# Patient Record
Sex: Female | Born: 1955 | Race: White | Hispanic: No | Smoking: Current every day smoker
Health system: Southern US, Community
[De-identification: ages and names within clinical notes are randomized; demographics above are authoritative.]

## PROBLEM LIST (undated history)

## (undated) DIAGNOSIS — I252 Old myocardial infarction: Secondary | ICD-10-CM

## (undated) DIAGNOSIS — Z860100 Personal history of colon polyps, unspecified: Secondary | ICD-10-CM

## (undated) DIAGNOSIS — I2510 Atherosclerotic heart disease of native coronary artery without angina pectoris: Secondary | ICD-10-CM

## (undated) DIAGNOSIS — R011 Cardiac murmur, unspecified: Secondary | ICD-10-CM

## (undated) DIAGNOSIS — M1990 Unspecified osteoarthritis, unspecified site: Secondary | ICD-10-CM

## (undated) DIAGNOSIS — IMO0002 Reserved for concepts with insufficient information to code with codable children: Secondary | ICD-10-CM

## (undated) DIAGNOSIS — K603 Anal fistula, unspecified: Secondary | ICD-10-CM

## (undated) DIAGNOSIS — K5010 Crohn's disease of large intestine without complications: Secondary | ICD-10-CM

## (undated) HISTORY — DX: Anal fistula: K60.3

## (undated) HISTORY — PX: APPENDECTOMY: SHX54

## (undated) HISTORY — DX: Personal history of colonic polyps: Z86.010

## (undated) HISTORY — DX: Crohn's disease of large intestine without complications: K50.10

## (undated) HISTORY — DX: Old myocardial infarction: I25.2

## (undated) HISTORY — DX: Atherosclerotic heart disease of native coronary artery without angina pectoris: I25.10

## (undated) HISTORY — PX: ANGIOPLASTY: SHX39

## (undated) HISTORY — DX: Cardiac murmur, unspecified: R01.1

## (undated) HISTORY — DX: Personal history of colon polyps, unspecified: Z86.0100

## (undated) HISTORY — PX: CHOLECYSTECTOMY: SHX55

## (undated) HISTORY — DX: Anal fistula, unspecified: K60.30

## (undated) HISTORY — DX: Reserved for concepts with insufficient information to code with codable children: IMO0002

## (undated) HISTORY — DX: Unspecified osteoarthritis, unspecified site: M19.90

---
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
## 1984-03-07 DIAGNOSIS — I252 Old myocardial infarction: Secondary | ICD-10-CM

## 1984-03-07 HISTORY — DX: Old myocardial infarction: I25.2

## 1986-03-07 HISTORY — PX: COLON RESECTION: SHX5231

## 1991-03-08 HISTORY — PX: TUBAL LIGATION: SHX77

## 1999-08-23 ENCOUNTER — Encounter (HOSPITAL_COMMUNITY): Admission: RE | Admit: 1999-08-23 | Discharge: 1999-11-21 | Payer: Self-pay | Admitting: Internal Medicine

## 2000-03-23 ENCOUNTER — Encounter (HOSPITAL_COMMUNITY): Admission: RE | Admit: 2000-03-23 | Discharge: 2000-06-21 | Payer: Self-pay | Admitting: Internal Medicine

## 2002-11-05 ENCOUNTER — Encounter (INDEPENDENT_AMBULATORY_CARE_PROVIDER_SITE_OTHER): Payer: Self-pay | Admitting: Specialist

## 2002-11-05 ENCOUNTER — Ambulatory Visit (HOSPITAL_COMMUNITY): Admission: RE | Admit: 2002-11-05 | Discharge: 2002-11-05 | Payer: Self-pay | Admitting: Internal Medicine

## 2002-11-25 ENCOUNTER — Encounter (HOSPITAL_COMMUNITY): Admission: RE | Admit: 2002-11-25 | Discharge: 2003-02-23 | Payer: Self-pay | Admitting: Internal Medicine

## 2004-05-19 ENCOUNTER — Ambulatory Visit: Payer: Self-pay | Admitting: Internal Medicine

## 2004-05-21 ENCOUNTER — Ambulatory Visit (HOSPITAL_COMMUNITY): Admission: RE | Admit: 2004-05-21 | Discharge: 2004-05-21 | Payer: Self-pay | Admitting: Internal Medicine

## 2004-06-30 ENCOUNTER — Ambulatory Visit: Payer: Self-pay | Admitting: Internal Medicine

## 2004-08-09 ENCOUNTER — Ambulatory Visit: Payer: Self-pay | Admitting: Internal Medicine

## 2004-09-30 ENCOUNTER — Ambulatory Visit: Payer: Self-pay | Admitting: Internal Medicine

## 2004-11-23 ENCOUNTER — Ambulatory Visit: Payer: Self-pay | Admitting: Internal Medicine

## 2005-01-24 ENCOUNTER — Ambulatory Visit: Payer: Self-pay | Admitting: Internal Medicine

## 2005-06-01 ENCOUNTER — Ambulatory Visit: Payer: Self-pay | Admitting: Internal Medicine

## 2005-09-05 ENCOUNTER — Ambulatory Visit: Payer: Self-pay | Admitting: Internal Medicine

## 2005-09-26 ENCOUNTER — Ambulatory Visit: Payer: Self-pay | Admitting: Internal Medicine

## 2005-09-26 ENCOUNTER — Encounter: Payer: Self-pay | Admitting: Internal Medicine

## 2005-09-26 DIAGNOSIS — K5010 Crohn's disease of large intestine without complications: Secondary | ICD-10-CM | POA: Insufficient documentation

## 2005-11-01 ENCOUNTER — Ambulatory Visit: Payer: Self-pay | Admitting: Internal Medicine

## 2005-12-21 ENCOUNTER — Ambulatory Visit: Payer: Self-pay | Admitting: Internal Medicine

## 2006-05-17 ENCOUNTER — Ambulatory Visit: Payer: Self-pay | Admitting: Internal Medicine

## 2007-01-01 ENCOUNTER — Ambulatory Visit: Payer: Self-pay | Admitting: Internal Medicine

## 2007-01-01 LAB — CONVERTED CEMR LAB
Albumin: 3.7 g/dL (ref 3.5–5.2)
Alkaline Phosphatase: 82 units/L (ref 39–117)
BUN: 4 mg/dL — ABNORMAL LOW (ref 6–23)
Basophils Absolute: 0.1 10*3/uL (ref 0.0–0.1)
Creatinine, Ser: 0.7 mg/dL (ref 0.4–1.2)
Eosinophils Absolute: 0.1 10*3/uL (ref 0.0–0.6)
Eosinophils Relative: 0.9 % (ref 0.0–5.0)
Folate: 5.2 ng/mL
GFR calc Af Amer: 113 mL/min
MCV: 87.4 fL (ref 78.0–100.0)
Platelets: 459 10*3/uL — ABNORMAL HIGH (ref 150–400)
Potassium: 4.6 meq/L (ref 3.5–5.1)
RBC: 4.65 M/uL (ref 3.87–5.11)
Saturation Ratios: 17.5 % — ABNORMAL LOW (ref 20.0–50.0)
Sed Rate: 57 mm/hr — ABNORMAL HIGH (ref 0–25)
TSH: 0.4 microintl units/mL (ref 0.35–5.50)
Transferrin: 249.3 mg/dL (ref >=212.0)
WBC: 12.6 10*3/uL — ABNORMAL HIGH (ref 4.5–10.5)

## 2007-05-03 DIAGNOSIS — I2510 Atherosclerotic heart disease of native coronary artery without angina pectoris: Secondary | ICD-10-CM | POA: Insufficient documentation

## 2007-05-03 DIAGNOSIS — K603 Anal fistula, unspecified: Secondary | ICD-10-CM | POA: Insufficient documentation

## 2007-05-11 ENCOUNTER — Ambulatory Visit: Payer: Self-pay | Admitting: Internal Medicine

## 2007-06-11 ENCOUNTER — Ambulatory Visit: Payer: Self-pay | Admitting: Internal Medicine

## 2007-06-14 ENCOUNTER — Ambulatory Visit: Payer: Self-pay | Admitting: Internal Medicine

## 2007-06-14 ENCOUNTER — Encounter: Payer: Self-pay | Admitting: Internal Medicine

## 2007-07-23 ENCOUNTER — Telehealth: Payer: Self-pay | Admitting: Internal Medicine

## 2007-08-23 ENCOUNTER — Telehealth: Payer: Self-pay | Admitting: Internal Medicine

## 2007-09-24 ENCOUNTER — Telehealth: Payer: Self-pay | Admitting: Internal Medicine

## 2007-10-23 ENCOUNTER — Telehealth: Payer: Self-pay | Admitting: Internal Medicine

## 2007-11-26 ENCOUNTER — Telehealth: Payer: Self-pay | Admitting: Internal Medicine

## 2007-12-25 ENCOUNTER — Telehealth: Payer: Self-pay | Admitting: Internal Medicine

## 2008-01-25 ENCOUNTER — Telehealth: Payer: Self-pay | Admitting: Internal Medicine

## 2008-02-21 ENCOUNTER — Telehealth: Payer: Self-pay | Admitting: Internal Medicine

## 2008-03-25 ENCOUNTER — Telehealth: Payer: Self-pay | Admitting: Internal Medicine

## 2008-04-28 ENCOUNTER — Telehealth: Payer: Self-pay | Admitting: Internal Medicine

## 2008-05-19 ENCOUNTER — Ambulatory Visit: Payer: Self-pay | Admitting: Internal Medicine

## 2008-05-21 ENCOUNTER — Encounter: Payer: Self-pay | Admitting: Internal Medicine

## 2008-05-22 ENCOUNTER — Encounter: Payer: Self-pay | Admitting: Internal Medicine

## 2008-06-09 ENCOUNTER — Encounter: Payer: Self-pay | Admitting: Internal Medicine

## 2008-06-26 ENCOUNTER — Telehealth: Payer: Self-pay | Admitting: Internal Medicine

## 2008-07-02 ENCOUNTER — Telehealth: Payer: Self-pay | Admitting: Internal Medicine

## 2008-07-02 ENCOUNTER — Encounter: Payer: Self-pay | Admitting: Internal Medicine

## 2008-07-07 ENCOUNTER — Ambulatory Visit: Payer: Self-pay | Admitting: Internal Medicine

## 2008-07-08 LAB — CONVERTED CEMR LAB
ALT: 11 units/L (ref 0–35)
AST: 16 units/L (ref 0–37)
CO2: 25 meq/L (ref 19–32)
Chloride: 102 meq/L (ref 96–112)
Eosinophils Relative: 0.1 % (ref 0.0–5.0)
GFR calc non Af Amer: 110.92 mL/min (ref >60)
Monocytes Relative: 10.4 % (ref 3.0–12.0)
Neutrophils Relative %: 55.2 % (ref 43.0–77.0)
Platelets: 418 10*3/uL — ABNORMAL HIGH (ref 150.0–400.0)
Sed Rate: 76 mm/hr — ABNORMAL HIGH (ref 0–22)
Sodium: 132 meq/L — ABNORMAL LOW (ref 135–145)
Total Bilirubin: 0.5 mg/dL (ref 0.3–1.2)
Total Protein: 7.2 g/dL (ref 6.0–8.3)
WBC: 18.8 10*3/uL (ref 4.5–10.5)

## 2008-07-09 ENCOUNTER — Ambulatory Visit: Payer: Self-pay | Admitting: Internal Medicine

## 2008-07-09 LAB — CONVERTED CEMR LAB
Basophils Absolute: 0 10*3/uL (ref 0.0–0.1)
Eosinophils Absolute: 0.1 10*3/uL (ref 0.0–0.7)
HCT: 27.9 % — ABNORMAL LOW (ref 36.0–46.0)
Lymphs Abs: 4 10*3/uL (ref 0.7–4.0)
MCHC: 34.9 g/dL (ref 30.0–36.0)
Monocytes Absolute: 2.1 10*3/uL — ABNORMAL HIGH (ref 0.1–1.0)
Monocytes Relative: 14.7 % — ABNORMAL HIGH (ref 3.0–12.0)
Platelets: 491 10*3/uL — ABNORMAL HIGH (ref 150.0–400.0)
RDW: 17.1 % — ABNORMAL HIGH (ref 11.5–14.6)

## 2008-07-10 ENCOUNTER — Telehealth: Payer: Self-pay | Admitting: Internal Medicine

## 2008-07-14 ENCOUNTER — Ambulatory Visit: Payer: Self-pay | Admitting: Cardiovascular Disease

## 2008-07-15 ENCOUNTER — Telehealth: Payer: Self-pay | Admitting: Internal Medicine

## 2008-07-16 ENCOUNTER — Encounter: Payer: Self-pay | Admitting: Internal Medicine

## 2008-07-17 ENCOUNTER — Telehealth: Payer: Self-pay | Admitting: Internal Medicine

## 2008-07-17 ENCOUNTER — Ambulatory Visit: Payer: Self-pay | Admitting: Internal Medicine

## 2008-07-17 DIAGNOSIS — N390 Urinary tract infection, site not specified: Secondary | ICD-10-CM

## 2008-07-17 LAB — CONVERTED CEMR LAB
ALT: 10 units/L (ref 0–35)
Albumin: 2.4 g/dL — ABNORMAL LOW (ref 3.5–5.2)
Basophils Absolute: 0.1 10*3/uL (ref 0.0–0.1)
CO2: 30 meq/L (ref 19–32)
Chloride: 100 meq/L (ref 96–112)
Eosinophils Relative: 0.6 % (ref 0.0–5.0)
GFR calc non Af Amer: 110.9 mL/min (ref >60)
Glucose, Bld: 90 mg/dL (ref 70–99)
HCT: 30.3 % — ABNORMAL LOW (ref 36.0–46.0)
Lymphocytes Relative: 30.9 % (ref 12.0–46.0)
Monocytes Relative: 7.8 % (ref 3.0–12.0)
Neutrophils Relative %: 60.1 % (ref 43.0–77.0)
Platelets: 514 10*3/uL — ABNORMAL HIGH (ref 150.0–400.0)
Potassium: 4.8 meq/L (ref 3.5–5.1)
RDW: 17.4 % — ABNORMAL HIGH (ref 11.5–14.6)
Sodium: 134 meq/L — ABNORMAL LOW (ref 135–145)
Total Protein: 7.4 g/dL (ref 6.0–8.3)
WBC: 15.2 10*3/uL — ABNORMAL HIGH (ref 4.5–10.5)

## 2008-07-28 ENCOUNTER — Telehealth: Payer: Self-pay | Admitting: Internal Medicine

## 2008-07-31 ENCOUNTER — Encounter: Payer: Self-pay | Admitting: Internal Medicine

## 2008-07-31 ENCOUNTER — Ambulatory Visit: Payer: Self-pay | Admitting: Internal Medicine

## 2008-07-31 DIAGNOSIS — K5190 Ulcerative colitis, unspecified, without complications: Secondary | ICD-10-CM | POA: Insufficient documentation

## 2008-08-01 ENCOUNTER — Telehealth: Payer: Self-pay | Admitting: Internal Medicine

## 2008-08-01 LAB — CONVERTED CEMR LAB
Basophils Absolute: 0.1 10*3/uL (ref 0.0–0.1)
Eosinophils Relative: 3.3 % (ref 0.0–5.0)
HCT: 31.6 % — ABNORMAL LOW (ref 36.0–46.0)
Lymphs Abs: 4.6 10*3/uL — ABNORMAL HIGH (ref 0.7–4.0)
Monocytes Absolute: 1.1 10*3/uL — ABNORMAL HIGH (ref 0.1–1.0)
Monocytes Relative: 11 % (ref 3.0–12.0)
Neutrophils Relative %: 39 % — ABNORMAL LOW (ref 43.0–77.0)
Platelets: 451 10*3/uL — ABNORMAL HIGH (ref 150.0–400.0)
RDW: 15 % — ABNORMAL HIGH (ref 11.5–14.6)
WBC: 10 10*3/uL (ref 4.5–10.5)

## 2008-08-21 ENCOUNTER — Telehealth: Payer: Self-pay | Admitting: Internal Medicine

## 2008-09-04 ENCOUNTER — Encounter: Payer: Self-pay | Admitting: Internal Medicine

## 2008-09-18 ENCOUNTER — Telehealth: Payer: Self-pay | Admitting: Internal Medicine

## 2008-10-06 ENCOUNTER — Telehealth: Payer: Self-pay | Admitting: Internal Medicine

## 2008-10-14 ENCOUNTER — Inpatient Hospital Stay (HOSPITAL_COMMUNITY): Admission: RE | Admit: 2008-10-14 | Discharge: 2008-10-16 | Payer: Self-pay | Admitting: Surgery

## 2008-11-06 ENCOUNTER — Encounter: Payer: Self-pay | Admitting: Internal Medicine

## 2008-11-17 ENCOUNTER — Telehealth: Payer: Self-pay | Admitting: Internal Medicine

## 2008-12-15 ENCOUNTER — Ambulatory Visit: Payer: Self-pay | Admitting: Internal Medicine

## 2009-01-19 ENCOUNTER — Telehealth: Payer: Self-pay | Admitting: Internal Medicine

## 2009-02-09 ENCOUNTER — Telehealth: Payer: Self-pay | Admitting: Internal Medicine

## 2009-02-25 ENCOUNTER — Encounter (INDEPENDENT_AMBULATORY_CARE_PROVIDER_SITE_OTHER): Payer: Self-pay | Admitting: *Deleted

## 2009-03-26 ENCOUNTER — Telehealth: Payer: Self-pay | Admitting: Internal Medicine

## 2009-04-24 ENCOUNTER — Telehealth: Payer: Self-pay | Admitting: Internal Medicine

## 2009-05-08 ENCOUNTER — Ambulatory Visit: Payer: Self-pay | Admitting: Internal Medicine

## 2009-05-11 LAB — CONVERTED CEMR LAB
Albumin: 4.1 g/dL (ref 3.5–5.2)
BUN: 10 mg/dL (ref 6–23)
Basophils Relative: 0.4 % (ref 0.0–3.0)
CO2: 32 meq/L (ref 19–32)
Calcium: 9.4 mg/dL (ref 8.4–10.5)
Chloride: 98 meq/L (ref 96–112)
Eosinophils Relative: 1.7 % (ref 0.0–5.0)
GFR calc non Af Amer: 79.33 mL/min (ref >60)
Glucose, Bld: 85 mg/dL (ref 70–99)
HCT: 40.9 % (ref 36.0–46.0)
MCV: 92.1 fL (ref 78.0–100.0)
Monocytes Absolute: 0.6 10*3/uL (ref 0.1–1.0)
Monocytes Relative: 6.5 % (ref 3.0–12.0)
Neutrophils Relative %: 33.9 % — ABNORMAL LOW (ref 43.0–77.0)
Platelets: 357 10*3/uL (ref 150.0–400.0)
Potassium: 4.8 meq/L (ref 3.5–5.1)
RBC: 4.44 M/uL (ref 3.87–5.11)
Sed Rate: 28 mm/hr — ABNORMAL HIGH (ref 0–22)
Sodium: 136 meq/L (ref 135–145)
Total Protein: 9.1 g/dL — ABNORMAL HIGH (ref 6.0–8.3)
WBC: 9.2 10*3/uL (ref 4.5–10.5)

## 2009-05-12 ENCOUNTER — Ambulatory Visit: Payer: Self-pay | Admitting: Internal Medicine

## 2009-05-18 ENCOUNTER — Ambulatory Visit: Payer: Self-pay | Admitting: Internal Medicine

## 2009-05-19 ENCOUNTER — Encounter: Payer: Self-pay | Admitting: Internal Medicine

## 2009-10-27 ENCOUNTER — Telehealth: Payer: Self-pay | Admitting: Internal Medicine

## 2009-12-01 ENCOUNTER — Ambulatory Visit: Payer: Self-pay | Admitting: Internal Medicine

## 2009-12-01 DIAGNOSIS — R109 Unspecified abdominal pain: Secondary | ICD-10-CM | POA: Insufficient documentation

## 2009-12-01 DIAGNOSIS — K6289 Other specified diseases of anus and rectum: Secondary | ICD-10-CM

## 2009-12-01 LAB — CONVERTED CEMR LAB
ALT: 13 units/L (ref 0–35)
Alkaline Phosphatase: 123 units/L — ABNORMAL HIGH (ref 39–117)
Basophils Absolute: 0.1 10*3/uL (ref 0.0–0.1)
CO2: 30 meq/L (ref 19–32)
Creatinine, Ser: 0.9 mg/dL (ref 0.4–1.2)
Eosinophils Relative: 2.8 % (ref 0.0–5.0)
GFR calc non Af Amer: 65.72 mL/min (ref >60)
HCT: 38.2 % (ref 36.0–46.0)
Iron: 50 ug/dL (ref 42–145)
Lymphocytes Relative: 38.4 % (ref 12.0–46.0)
Lymphs Abs: 3.7 10*3/uL (ref 0.7–4.0)
Monocytes Relative: 6.7 % (ref 3.0–12.0)
Neutrophils Relative %: 51.3 % (ref 43.0–77.0)
Platelets: 530 10*3/uL — ABNORMAL HIGH (ref 150.0–400.0)
RDW: 13.8 % (ref 11.5–14.6)
Saturation Ratios: 14.5 % — ABNORMAL LOW (ref 20.0–50.0)
Sed Rate: 52 mm/hr — ABNORMAL HIGH (ref 0–22)
Total Bilirubin: 0.2 mg/dL — ABNORMAL LOW (ref 0.3–1.2)
Vitamin B-12: 277 pg/mL (ref 211–911)
WBC: 9.6 10*3/uL (ref 4.5–10.5)

## 2009-12-03 ENCOUNTER — Ambulatory Visit: Payer: Self-pay | Admitting: Cardiology

## 2009-12-04 ENCOUNTER — Encounter: Payer: Self-pay | Admitting: Internal Medicine

## 2009-12-21 ENCOUNTER — Ambulatory Visit: Payer: Self-pay | Admitting: Internal Medicine

## 2009-12-22 ENCOUNTER — Telehealth: Payer: Self-pay | Admitting: Internal Medicine

## 2010-01-15 ENCOUNTER — Telehealth: Payer: Self-pay | Admitting: Internal Medicine

## 2010-02-04 ENCOUNTER — Ambulatory Visit: Payer: Self-pay | Admitting: Cardiology

## 2010-02-04 ENCOUNTER — Ambulatory Visit: Payer: Self-pay | Admitting: Internal Medicine

## 2010-03-06 ENCOUNTER — Encounter: Payer: Self-pay | Admitting: Internal Medicine

## 2010-03-07 HISTORY — PX: COLOSTOMY: SHX63

## 2010-03-24 ENCOUNTER — Other Ambulatory Visit: Payer: Self-pay | Admitting: Internal Medicine

## 2010-03-24 ENCOUNTER — Ambulatory Visit
Admission: RE | Admit: 2010-03-24 | Discharge: 2010-03-24 | Payer: Self-pay | Source: Home / Self Care | Attending: Internal Medicine | Admitting: Internal Medicine

## 2010-03-24 LAB — CBC WITH DIFFERENTIAL/PLATELET
Basophils Absolute: 0.1 10*3/uL (ref 0.0–0.1)
Basophils Relative: 0.8 % (ref 0.0–3.0)
Eosinophils Absolute: 0.3 10*3/uL (ref 0.0–0.7)
Eosinophils Relative: 3.3 % (ref 0.0–5.0)
HCT: 37 % (ref 36.0–46.0)
Hemoglobin: 12.7 g/dL (ref 12.0–15.0)
Lymphocytes Relative: 34.1 % (ref 12.0–46.0)
Lymphs Abs: 3.2 10*3/uL (ref 0.7–4.0)
MCHC: 34.2 g/dL (ref 30.0–36.0)
MCV: 90.8 fl (ref 78.0–100.0)
Monocytes Absolute: 0.6 10*3/uL (ref 0.1–1.0)
Monocytes Relative: 6.3 % (ref 3.0–12.0)
Neutro Abs: 5.1 10*3/uL (ref 1.4–7.7)
Neutrophils Relative %: 55.5 % (ref 43.0–77.0)
Platelets: 407 10*3/uL — ABNORMAL HIGH (ref 150.0–400.0)
RBC: 4.08 Mil/uL (ref 3.87–5.11)
RDW: 14.6 % (ref 11.5–14.6)
WBC: 9.3 10*3/uL (ref 4.5–10.5)

## 2010-04-06 NOTE — Progress Notes (Signed)
Summary: med refill  Phone Note Call from Patient Call back at Home Phone [phone]   Caller: Patient Call For: Dr. Juanda Chance Reason for Call: Talk to Nurse Summary of Call: [facility] [address] will run out of Hydrocodone before sch'ed appt Initial call taken by: Vallarie Mare,  October 27, 2009 11:12 AM  Follow-up for Phone Call        Prescription sent until her appointment on 12/01/09. Dottie Nelson-Smith CMA Duncan Dull)  October 27, 2009 12:46 PM     New/Updated Medications: VICODIN 5-500 MG TABS (HYDROCODONE-ACETAMINOPHEN) Take 1 tablet by mouth once a day as needed. MUST LAST UNTIL OFFICE VISIT ! Prescriptions: VICODIN 5-500 MG TABS (HYDROCODONE-ACETAMINOPHEN) Take 1 tablet by mouth once a day as needed. MUST LAST UNTIL OFFICE VISIT !  #25 x 0   Entered by:   Lamona Curl CMA (AAMA)   Authorized by:   Hart Carwin MD   Signed by:   Lamona Curl CMA (AAMA) on 10/27/2009   Method used:   Printed then faxed to ...       [facility] [address] # [phone]* (retail)       [address] [place], [place]  [address]       Ph: [phone]       Fax: [phone]   RxID:   [id number]
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
## 2010-04-06 NOTE — Progress Notes (Signed)
Summary: Medication  Phone Note Call from Patient Call back at Home Phone [phone]   Caller: Patient Call For: Dr. Juanda Chance Reason for Call: Refill Medication Summary of Call: Needs refill on her HYDROCODONE....sch'd appt for 02-04-10 Initial call taken by: Karna Christmas,  January 15, 2010 10:14 AM  Follow-up for Phone Call        left message that she can have her vicodin on 11/20... pharm aware Follow-up by: Harlow Mares CMA Duncan Dull),  January 15, 2010 4:34 PM

## 2010-04-06 NOTE — Progress Notes (Signed)
Summary: med ?'s  Phone Note Call from Patient Call back at [facility] Phone [phone]   Caller: Patient Call For: Dr. Juanda Chance Reason for Call: Talk to Nurse Summary of Call: does she need to change her Asacol dosage? Initial call taken by: Vallarie Mare,  December 22, 2009 3:16 PM  Follow-up for Phone Call        Message left for patient to call back.  Follow-up by: Jesse Fall RN,  December 22, 2009 3:31 PM  Additional Follow-up for Phone Call Additional follow up Details #1::        Spoke with patient. Asacol dose is to remain the same as per Dr. Regino Schultze dictation from 12/21/09.

## 2010-04-06 NOTE — Assessment & Plan Note (Signed)
Summary: ANNUAL TB TEST               [facility]  Nurse Visit   Allergies: 1)  ! Remicade (Infliximab)  Immunizations Administered:  PPD Skin Test:    Vaccine Type: PPD    Site: right forearm    Mfr: Sanofi Pasteur    Dose: 0.1 ml    Route: ID    Given by: Chales Abrahams CMA (AAMA)    Exp. Date: 08/02/2011    Lot #: [id number]  PPD Results    Date of reading: 05/20/2009    Results: < 5mm    Interpretation: negative  Orders Added: 1)  TB Skin Test [86580] 2)  Admin 1st Vaccine [36644]

## 2010-04-06 NOTE — Progress Notes (Signed)
Summary: Medication refill  Phone Note Call from Patient Call back at Home Phone [phone]   Caller: Patient Call For: Dr. Juanda Chance Reason for Call: Refill Medication Summary of Call: Pt needs her Oxycodone refilled Initial call taken by: Karna Christmas,  April 24, 2009 11:22 AM  Follow-up for Phone Call        Rx created and awaiting Dr Regino Schultze signature.  We have given her only #25 this month as we are trying to wean her off these meds. Follow-up by: Hortense Ramal CMA Duncan Dull),  April 27, 2009 9:54 AM    Prescriptions: OXYCODONE-ACETAMINOPHEN 5-500 MG CAPS (OXYCODONE-ACETAMINOPHEN) Take 1-2 tablets by mouth once daily as needed  #25 x 0   Entered by:   Hortense Ramal CMA (AAMA)   Authorized by:   Hart Carwin MD   Signed by:   Hortense Ramal CMA (AAMA) on 04/27/2009   Method used:   Print then Give to Patient   RxID:   [id number]
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
## 2010-04-06 NOTE — Progress Notes (Signed)
Summary: pain meds  Phone Note Call from Patient Call back at Home Phone [phone]   Caller: Patient Call For: Dr. Juanda Chance Reason for Call: Refill Medication Summary of Call: needs refill of pain meds Initial call taken by: Vallarie Mare,  March 26, 2009 1:56 PM  Follow-up for Phone Call        Rx created. Dr Juanda Chance to sign upon her return on Monday 03/30/09. Follow-up by: Hortense Ramal CMA Duncan Dull),  March 27, 2009 9:09 AM    Prescriptions: OXYCODONE-ACETAMINOPHEN 5-500 MG CAPS (OXYCODONE-ACETAMINOPHEN) Take 1-2 tablets by mouth once daily as needed  #30 x 0   Entered by:   Hortense Ramal CMA (AAMA)   Authorized by:   Hart Carwin MD   Signed by:   Hortense Ramal CMA (AAMA) on 03/27/2009   Method used:   Print then Give to Patient   RxID:   [id number]

## 2010-04-06 NOTE — Letter (Signed)
Summary: Dr. Valarie Merino  Dr. Valarie Merino   Imported By: Esmeralda Links D'jimraou 12/26/2009 11:57:07  _____________________________________________________________________  External Attachment:    Type:   Image     Comment:   External Document
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
## 2010-04-06 NOTE — Letter (Signed)
Summary: [facility] Instructions  [facility]  [address] [place], [place] [address]   Phone: [phone]  Fax: [phone]       [name]    11/23/1959    MRN: [id number]       Procedure Day /Date: 05/12/09 (Tuesday)     Arrival Time: 2:00 pm     Procedure Time: 3:00 pm     Location of Procedure:                    _x_  [facility] (4th Floor)    PREPARATION FOR COLONOSCOPY WITH MIRALAX  Starting 5 days prior to your procedure (beginning today) do not eat nuts, seeds, popcorn, corn, beans, peas,  salads, or any raw vegetables.  Do not take any fiber supplements (e.g. Metamucil, Citrucel, and Benefiber). ____________________________________________________________________________________________________   THE DAY BEFORE YOUR PROCEDURE         DATE: 05/11/09 DAY: Monday  1   Drink clear liquids the entire day-NO SOLID FOOD  2   Do not drink anything colored red or purple.  Avoid juices with pulp.  No orange juice.  3   Drink at least 64 oz. (8 glasses) of fluid/clear liquids during the day to prevent dehydration and help the prep work efficiently.  CLEAR LIQUIDS INCLUDE: Water Jello Ice Popsicles Tea (sugar ok, no milk/cream) Powdered fruit flavored drinks Coffee (sugar ok, no milk/cream) Gatorade Juice: apple, white grape, white cranberry  Lemonade Clear bullion, consomm, broth Carbonated beverages (any kind) Strained chicken noodle soup Hard Candy  4   Mix the entire bottle of Miralax with 64 oz. of Gatorade/Powerade in the morning and put in the refrigerator to chill.  5   At 3:00 pm take 2 Dulcolax/Bisacodyl tablets.  6   At 4:30 pm take one Reglan/Metoclopramide tablet.  7  Starting at 5:00 pm drink one 8 oz glass of the Miralax mixture every 15-20 minutes until you have finished drinking the entire 64 oz.  You should finish drinking prep around 7:30 or 8:00 pm.  8   If you are nauseated, you may take the 2nd  Reglan/Metoclopramide tablet at 6:30 pm.        9    At 8:00 pm take 2 more DULCOLAX/Bisacodyl tablets.       THE DAY OF YOUR PROCEDURE      DATE:  05/12/09 DAY: Tuesday  You may drink clear liquids until 1:00 pm  (2 HOURS BEFORE PROCEDURE).   MEDICATION INSTRUCTIONS  Unless otherwise instructed, you should take regular prescription medications with a small sip of water as early as possible the morning of your procedure.        OTHER INSTRUCTIONS  You will need a responsible adult at least 56 years of age to accompany you and drive you home.   This person must remain in the waiting room during your procedure.  Wear loose fitting clothing that is easily removed.  Leave jewelry and other valuables at home.  However, you may wish to bring a book to read or an iPod/MP3 player to listen to music as you wait for your procedure to start.  Remove all body piercing jewelry and leave at home.  Total time from sign-in until discharge is approximately 2-3 hours.  You should go home directly after your procedure and rest.  You can resume normal activities the day after your procedure.  The day of your procedure you should not:   Drive  Make legal decisions   Operate machinery   Drink alcohol   Return to work  You will receive specific instructions about eating, activities and medications before you leave.   The above instructions have been reviewed and explained to me by   Hortense Ramal CMA Duncan Dull)  May 08, 2009 12:10 PM     I fully understand and can verbalize these instructions _____________________________ Date 05/08/09

## 2010-04-06 NOTE — Letter (Signed)
Summary: Patient Notice- Polyp Results   [facility]  [address] [place], [place] [address]   Phone: [phone]  Fax: [phone]        May 15, 2009 MRN: [id number]    [name] [address] [name], [place]  [address]    Dear [name],  I am pleased to inform you that the biopsies taken during your recent colonoscopy did not show any evidence of cancer upon pathologic examination.The biopsies of Your colon through the colostomy showed very mild colitis. The rest of Your colon  was normal  Additional information/recommendations:  __No further action is needed at this time.  Please follow-up with      your primary care physician for your other healthcare needs.  __Please call [phone] to schedule a return visit to review      your condition.  _x_Continue with the treatment plan as outlined on the day of your      exam.Please continue with the Humira and everything else.  _x_You should have a repeat colonoscopy examination for this problem           in 5_ years.  Please call us if you are having persistent problems or have questions about your condition that have not been fully answered at this time.  Sincerely,  Hart Carwin MD   This letter has been electronically signed by your physician.  Appended Document: Patient Notice- Polyp Results  Letter mailed 3.15.11.

## 2010-04-06 NOTE — Procedures (Signed)
Summary: Colonoscopy  Patient: [name] Note: All result statuses are Final unless otherwise noted.  Tests: (1) Colonoscopy (COL)   COL Colonoscopy           DONE     [facility]     [address]     [place], [place]  [address]           COLONOSCOPY PROCEDURE REPORT           PATIENT:  [name], [name]  MR#:  [id number]     BIRTHDATE:  [birth year], 53 yrs. old  GENDER:  female           ENDOSCOPIST:  Hedwig Morton. Juanda Chance, MD     Referred by:           PROCEDURE DATE:  05/12/2009     PROCEDURE:  Colonoscopy via colostomy     ASA CLASS:  Class II     INDICATIONS:  unexplained diarrhea Crohn's since 1986, s/p colon     resection in 1996 and diverting colostomy in 10/2008,     Humira 40 mg IM q 2 weeks           MEDICATIONS:   Versed 6 mg, Fentanyl 50 mcg           DESCRIPTION OF PROCEDURE:   After the risks benefits and     alternatives of the procedure were thoroughly explained, informed     consent was obtained.  No rectal exam performed. The LB [id number]-[id number]     [id number] endoscope was introduced through the anus and advanced to     the cecum, which was identified by both the appendix and ileocecal     valve, without limitations.  The quality of the prep was adequate,     using MiraLax.  The instrument was then slowly withdrawn as the     colon was fully examined.     <<PROCEDUREIMAGES>>           FINDINGS:  A normal appearing cecum, ileocecal valve, and     appendiceal orifice were identified. The ascending, hepatic     flexure, transverse, splenic flexure, descending, sigmoid colon,     and rectum appeared unremarkable. no evidence of Crohn'd disease,     normal appearing stoma and colon mucosa 0-60 cm Random biopsies     were obtained and sent to pathology (see image1, image2, image3,     image4, image5, and image6). #1 cecum and right colon, normal     appearing mucosa     #2, 0-40 cm left colon   Retroflexed views in the rectum revealed     no abnormalities.    The  scope was then withdrawn from the patient     and the procedure completed.           COMPLICATIONS:  None           ENDOSCOPIC IMPRESSION:     1) Normal colon     via colostomy to cecum, total 60 cm of colon remaining, s/p     random biopsies     RECOMMENDATIONS:     1) Await pathology results     continue meds           REPEAT EXAM:  In 5 year(s) for.           ______________________________     Hedwig Morton. Juanda Chance, MD           CC:  n.     eSIGNED:   Hedwig Morton. Maevyn Riordan at 05/12/2009 04:12 PM           Tyrone Schimke, [id number]  Note: An exclamation mark (!) indicates a result that was not dispersed into the flowsheet. Document Creation Date: 05/12/2009 4:12 PM _______________________________________________________________________  (1) Order result status: Final Collection or observation date-time: 05/12/2009 16:02 Requested date-time:  Receipt date-time:  Reported date-time:  Referring Physician:   Ordering Physician: [name] [phone]) Specimen Source:  Source: Launa Grill Order Number: [id number] Lab site:   Appended Document: Colonoscopy     Procedures Next Due Date:    Colonoscopy: 05/2014

## 2010-04-08 NOTE — Medication Information (Signed)
Summary: RECEIPT program/medco  RECEIPT program/medco   Imported By: Lester [place] 03/11/2010 09:18:19  _____________________________________________________________________  External Attachment:    Type:   Image     Comment:   External Document

## 2010-04-08 NOTE — Assessment & Plan Note (Signed)
Summary: 6 wk f/u--ch.   History of Present Illness Visit Type: Follow-up Visit Primary GI MD: [name] MD Primary Provider: na Requesting Provider: n/a Chief Complaint: F/u for crohn's. Pt states that she feels better and denies any GI complaints  History of Present Illness:   This is a 55 year old white female with Crohn's disease of the rectum, descending colon and ileum. She is status post recent diverting colostomy in August 2010. She is status post segmental resection of the sigmoid stricture in 1996. Her initial diagnosis of Crohn's disease was made in 1986. Her last appointment was 02/05/11. She has done very well and has only minimal complaints today of  pain radiating down her right leg posteriorly. She was able to taper her prednisone  last dose was 03/08/10. She still continues Humira, Asacol, and Imuran. There has been no fever or drainage around the rectum. Her CT scan of the abdomen 6 weeks ago showed improvement in the wall thickening within the descending colon and around the colostomy compared with 12/03/09.   GI Review of Systems      Denies abdominal pain, acid reflux, belching, bloating, chest pain, dysphagia with liquids, dysphagia with solids, heartburn, loss of appetite, nausea, vomiting, vomiting blood, weight loss, and  weight gain.        Denies anal fissure, black tarry stools, change in bowel habit, constipation, diarrhea, diverticulosis, fecal incontinence, heme positive stool, hemorrhoids, irritable bowel syndrome, jaundice, light color stool, liver problems, rectal bleeding, and  rectal pain.    Current Medications (verified): 1)  Diazepam 5 Mg  Tabs (Diazepam) .... Take One By Mouth As Needed 2)  Multivitamins   Tabs (Multiple Vitamin) .... Take One  Tablet By Mouth Once Daily 3)  Aspirin 81 Mg  Tabs (Aspirin) .... Take Two Tablets By Mouth Once Daily 4)  Vicodin 5-500 Mg Tabs (Hydrocodone-Acetaminophen) .... Take 1 Tablet By Mouth Two Times A Day 5)  Humira  Pen 40 Mg/0.44ml Kit (Adalimumab) .... Inject 1 Pen (40mg ) [place] Every Other Week. 6)  Coq-10 50 Mg Caps (Coenzyme Q10) .... Take One By Mouth Once Daily 7)  Bentyl 20 Mg Tabs (Dicyclomine Hcl) .... Take 1 Tablet By Mouth Two Times A Day As Needed For Crampy Abdominal Pain 8)  Asacol 400 Mg Tbec (Mesalamine) .... 2  By Mouth Three Times A Day 9)  Azathioprine Sodium 100 Mg Solr (Azathioprine Sodium) .... Take 1 Tablet By Mouth Once A Day  Allergies (verified): 1)  ! 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 cholecystectomy colon resection angioplasty Appendectomy colestomy  Family History: Reviewed history from 12/15/2008 and no changes required. Family History of Heart Disease: Brother, mother Family History of Lung Cancer: Paternal Uncle No FH of Colon Cancer:  Social History: Disabled  Patient is a former smoker.  Alcohol Use - no Daily Caffeine Use Illicit Drug Use - no Patient does not get regular exercise.   Review of Systems  The patient denies allergy/sinus, anemia, anxiety-new, arthritis/joint pain, back pain, blood in urine, breast changes/lumps, change in vision, confusion, cough, coughing up blood, depression-new, fainting, fatigue, fever, headaches-new, hearing problems, heart murmur, heart rhythm changes, itching, menstrual pain, muscle pains/cramps, night sweats, nosebleeds, pregnancy symptoms, shortness of breath, skin rash, sleeping  problems, sore throat, swelling of feet/legs, swollen lymph glands, thirst - excessive , urination - excessive , urination changes/pain, urine leakage, vision  changes, and voice change.         Pertinent positive and negative review of systems were noted in the above HPI. All other ROS was otherwise negative.   Vital Signs:  Patient profile:   55 year old female Height:      59.5 inches Weight:      116 pounds BMI:     23.12 BSA:     1.47 Pulse rate:   76 / minute Pulse rhythm:   regular BP sitting:   110 / 62  (left arm) Cuff size:   regular  Vitals Entered By: Ok Anis CMA (March 24, 2010 8:22 AM)  Physical Exam  General:  chronically ill appearing. Eyes:  nonicteric. Mouth:  edentulous. Neck:  Supple; no masses or thyromegaly. Lungs:  Clear throughout to auscultation. Heart:  Regular rate and rhythm; no murmurs, rubs,  or bruits. Abdomen:  soft abdomen with tenderness around the colostomy in the left upper quadrant. No stool in the appliance. Stoma appears healthy. Bowel sounds are normoactive. There is no rebound. Rectal:  perianal area with pseudopolyps and skin tags in the anal orifice. No active drainage in duration or tenderness. Msk:  no visible tenderness around the right sacroiliac area and in the right flank posteriorly. Straight leg raising is negative. Extremities:  No clubbing, cyanosis, edema or deformities noted.   Impression & Recommendations:  Problem # 1:  RECTAL PAIN (ZOX-096.04) Patient has slow resolution of the Crohn's disease of the rectum with no visible drainage at the present time. She does well on cyclic antibiotics. She will restart her on Cipro 250 b.i.d. and Flagyl 250 t.i.d. for one week.  Problem # 2:  CROHN'S DISEASE, LARGE INTESTINE (ICD-555.1) Patient has severe Crohn's disease as described in the history of present illness. She is doing well. Her colostomy is functioning. She needs to continue Humira, Asacol and Imuran. I will see her in 2 months. She will be due for a CBC today. Orders: TLB-CBC Platelet - w/Differential ([id number]-[id number])  Patient Instructions: 1)  Your physician requests  that you go to the basement floor of our office to have the following labwork completed before leaving today: CBC 2)  Please pick up your prescriptions at the pharmacy. Electronic prescription(s) has already been sent for Cipro 250 by mouth two times a day x 7 days and Flagyl 250 mg three times a day x 7 days. 3)  Please schedule a follow-up appointment in 2 months.  4)  The medication list was reviewed and reconciled.  All changed / newly prescribed medications were explained.  A complete medication list was provided to the patient / caregiver. Prescriptions: FLAGYL 250 MG TABS (METRONIDAZOLE) Take 1 tablet by mouth three times a day x 1 week  #21 x 0   Entered by:   Lamona Curl CMA (AAMA)   Authorized by:   Hart Carwin MD   Signed by:   Lamona Curl CMA (AAMA) on 03/24/2010   Method used:   Electronically to        [facility] # [phone]* (retail)       [address] [place], [place]  [address]       Ph: [phone]       Fax: [phone]   RxID:  [id number] CIPRO 250 MG TABS (CIPROFLOXACIN HCL) Take 1 tablet by mouth two times a day x 1 week  #14 x 0   Entered by:   Lamona Curl CMA (AAMA)   Authorized by:   Hart Carwin MD   Signed by:   Lamona Curl CMA (AAMA) on 03/24/2010   Method used:   Electronically to        [facility] # [phone]* (retail)       [address] [place], [place]  [address]       Ph: [phone]       Fax: [phone]   RxID:   [id number]

## 2010-05-11 ENCOUNTER — Encounter (INDEPENDENT_AMBULATORY_CARE_PROVIDER_SITE_OTHER): Payer: Medicare Other

## 2010-05-11 ENCOUNTER — Encounter (INDEPENDENT_AMBULATORY_CARE_PROVIDER_SITE_OTHER): Payer: Self-pay | Admitting: *Deleted

## 2010-05-11 DIAGNOSIS — K5010 Crohn's disease of large intestine without complications: Secondary | ICD-10-CM

## 2010-05-18 NOTE — Assessment & Plan Note (Signed)
Summary: annual tb test placement/dns  Nurse Visit   Allergies: 1)  ! Remicade (Infliximab)  Immunizations Administered:  PPD Skin Test:    Vaccine Type: PPD    Site: right forearm    Mfr: Sanofi Pasteur    Dose: 0.1 ml    Route: ID    Given by: Lowry Ram NCMA    Exp. Date: 09/17/2011    Lot #: [id number]  Orders Added: 1)  TB Skin Test [86580] 2)  Admin 1st Vaccine [13086]  Appended Document: annual tb test placement/dns    Nurse Visit   Allergies: 1)  ! Remicade (Infliximab)  Not Administered:    PPD Skin Test not placed  PPD Results    Date of reading: 05/13/2010    Results: < 5mm    Interpretation: negative

## 2010-05-28 ENCOUNTER — Other Ambulatory Visit: Payer: Self-pay | Admitting: Internal Medicine

## 2010-05-28 DIAGNOSIS — K5090 Crohn's disease, unspecified, without complications: Secondary | ICD-10-CM

## 2010-06-09 ENCOUNTER — Ambulatory Visit: Payer: Self-pay | Admitting: Internal Medicine

## 2010-06-12 LAB — BASIC METABOLIC PANEL
CO2: 27 mEq/L (ref 19–32)
Calcium: 9.2 mg/dL (ref 8.4–10.5)
GFR calc Af Amer: >60 mL/min (ref >60)
GFR calc non Af Amer: >60 mL/min (ref >60)
Sodium: 138 mEq/L (ref 135–145)

## 2010-06-12 LAB — CBC
Hemoglobin: 13.2 g/dL (ref 12.0–15.0)
MCHC: 34 g/dL (ref 30.0–36.0)
MCHC: 34 g/dL (ref 30.0–36.0)
MCV: 91.8 fL (ref 78.0–100.0)
MCV: 92.4 fL (ref 78.0–100.0)
Platelets: 344 10*3/uL (ref 150–400)
RBC: 4.21 MIL/uL (ref 3.87–5.11)
RDW: 12.7 % (ref 11.5–15.5)
RDW: 12.8 % (ref 11.5–15.5)

## 2010-06-30 ENCOUNTER — Other Ambulatory Visit: Payer: Self-pay | Admitting: Internal Medicine

## 2010-07-14 ENCOUNTER — Ambulatory Visit (INDEPENDENT_AMBULATORY_CARE_PROVIDER_SITE_OTHER): Payer: Medicare Other | Admitting: Internal Medicine

## 2010-07-14 ENCOUNTER — Encounter: Payer: Self-pay | Admitting: Internal Medicine

## 2010-07-14 VITALS — BP 120/64 | HR 88 | Ht 59.0 in | Wt 114.0 lb

## 2010-07-14 DIAGNOSIS — K5090 Crohn's disease, unspecified, without complications: Secondary | ICD-10-CM

## 2010-07-14 DIAGNOSIS — D849 Immunodeficiency, unspecified: Secondary | ICD-10-CM

## 2010-07-14 DIAGNOSIS — K5000 Crohn's disease of small intestine without complications: Secondary | ICD-10-CM

## 2010-07-14 MED ORDER — AMOXICILLIN 250 MG PO CAPS: 250.0000 mg | ORAL_CAPSULE | Freq: Three times a day (TID) | ORAL | Status: AC

## 2010-07-14 MED ORDER — MESALAMINE 400 MG PO TBEC: DELAYED_RELEASE_TABLET | ORAL | Status: DC

## 2010-07-14 NOTE — Progress Notes (Signed)
[name] 1955/03/09 MRN [id number]    History of Present Illness:  This is a 55 year old white female with Crohn's disease of the colon, terminal ileum and rectum who is status post diverting colostomy in August 2010. She is status post prior segmental resection of the sigmoid stricture in 1996. Her initial diagnosis of Crohn's disease was made in 1986. The last CT scan of the abdomen in December 2011 showed improvement in the wall thickening within the descending colon and around the colostomy. Her father just passed away and she has had been having financial problems because he was contributing to her house rent. She has not been able to afford her Humira or Asacol. She has noticed some rectal drainage recently but she denies abdominal pain, fever or rectal bleeding.   Past Medical History  Diagnosis Date  . Ulcerative colitis, unspecified   . Coronary atherosclerosis of unspecified type of vessel, native or graft   . Anal fistula    Past Surgical History  Procedure Date  . Cholecystectomy   . Colon resection   . Angioplasty   . Appendectomy     reports that she has quit smoking. Her smoking use included Cigarettes. She does not have any smokeless tobacco history on file. She reports that she does not drink alcohol or use illicit drugs. family history includes Heart disease in her brother and mother and Lung cancer in her paternal uncle.  There is no history of Colon polyps. Allergies  Allergen Reactions  . Infliximab         Review of Systems:  The remainder of the 10  point ROS is negative except as outlined in H&P   Physical Exam: General appearance  Well developed, in no distress, she appears somewhat anxious and teary-eyed. She also appears chronically ill. Eyes- non icteric. HEENT nontraumatic, normocephalic. Mouth no lesions, tongue papillated, no cheilosis, poor denture. Neck supple without adenopathy, thyroid not enlarged, no carotid bruits, no JVD. Lungs  Clear to auscultation bilaterally. Cor normal S1 normal S2, regular rhythm , no murmur,  quiet precordium. Abdomen soft but diffusely tender mostly to the right of the colostomy in the left upper quadrant. Liquid stool. Lower abdomen unremarkable. Rectal: Perianal area with Crohn's disease scarring but no drainage or fistula. External hemorrhoidal tags are present. Extremities no pedal edema. Skin no lesions. Neurological alert and oriented x 3. Psychological normal mood and affect.  Assessment and Plan:  Problem #1 Crohn's disease of the colon. Patient is status post a partial colectomy x 2. So far, she is doing well but is off her medications due to financial problems. I have given her samples of Asacol HD to take one tablet twice a day and a prescription for amoxicillin 250 by mouth 3 times a day when necessary for rectal drainage, she will have to get back on Humira as soon as possible.  Problem #2 Depression and anxiety due to the death of her father. She needs to continue Valium 5 mg when necessary.   07/14/2010 [name]

## 2010-07-14 NOTE — Patient Instructions (Signed)
Take Asacol HD (800 mg tablet) 1 tablet by mouth twice daily. We have given you samples of this. We have sent a prescription of Amoxicillin 250 mg to your pharmacy for you to take 1 tablet by mouth three times daily x 7 days.

## 2010-07-16 ENCOUNTER — Telehealth: Payer: Self-pay | Admitting: Internal Medicine

## 2010-07-16 NOTE — Telephone Encounter (Signed)
Spoke with [place] with [employer]. Patient has called them re: assistance with Humira.  [place] wants to know if the events patient had back in March of 2010(sepsis, cardiac arrest, seizures, resp.distress) were related to patient being on Humira. May answer this question as not related, possibly related, probably related or yes it was related. Please, advise

## 2010-07-16 NOTE — Telephone Encounter (Signed)
Spoke with patient and told her according to our records the diagnosis of Crohn's was made in 1986.

## 2010-07-18 NOTE — Telephone Encounter (Signed)
She was under the care of a different MD at [facility], but as far as I know, her illness was not caused  By Humira but by active  Crohn's disease

## 2010-07-20 NOTE — Assessment & Plan Note (Signed)
[facility]                         GASTROENTEROLOGY OFFICE NOTE   ZARIYA, MINNER                       MRN:          [id number]  DATE:01/01/2007                            DOB:          1955/10/01    Ms. Struve is a 55 year old white female with Crohn's disease of the colon  and rectum since 1986, status post Remicade infusion in 2001 and  currently on Humira. She was briefly on Humira in April 2006, but  because of lack of funds and lack of patient's assistance she had to  discontinue it in March 2007. She restarted again in March of 2008 and  has been on it with marked improvement of her rectal symptoms as well as  diarrhea and rectal pain. Her last colonoscopy in July of 2007 confirmed  rectal stricture and Crohn's disease of the rectum with some erosions  and bleeding. She has maintained her weight. We have been able to  discontinue her Imuran as well as her mesalamine. She has soft stools  without any diarrhea or constipation.   MEDICATIONS:  1. Diazepam 5 mg p.o. daily p.r.n.  2. Humira 40 mg every two weeks.  3. Oxycodone 7.5 mg p.r.n. rectal pain.   PHYSICAL EXAMINATION:  Blood pressure 98/58, pulse 80 and weight 98.8  pounds. Last weight a year ago was 103 pounds. She was alert and  oriented.  LUNGS: Clear to auscultation.  COR: Normal S1, normal S2.  ABDOMEN: Soft and nontender with normoactive bowel sounds. No  distention. No palpable mass.  RECTAL: Shows external hemorrhoidal tags and Crohn's disease. Skin tags  with tenderness and stenosis of the anal canal. I was unable to advance  my little finger all the way through into the ampulla although I was  very close to the ampulla. The diameter of the opening of the ampulla  probably was not within 5-mm. There was some bleeding and the patient  had discomfort as I dilated the stricture. There was some visible blood  on the glove.   IMPRESSION:  A 55 year old white female with  Crohn's disease of the  rectum, much improved on the Humira injections. She has developed anal  stricture, which is not clinically significant because she is having  soft stools and going regularly. She is not having obstructive symptoms.  She has peri-anal disease with intermittent drainage in the peri-anal  fistula--currently not active.   PLAN:  1. Refills for valium 5 mg dispensed 40 with three refills.  2. Oxycodone 7.5 mg dispensed 60 with no refills.  3. Cipro 250 mg p.o. b.i.d. dispensed 20 with two refills to take for      draining fistula.  4. CBC, CMET, iron studies, B12 and sed rate today. I will see her      again in six months.    Hedwig Morton. Juanda Chance, MD  Electronically Signed   DMB/MedQ  DD: 01/01/2007  DT: 01/01/2007  Job #: [id number]

## 2010-07-20 NOTE — Assessment & Plan Note (Signed)
[name]                         GASTROENTEROLOGY OFFICE NOTE   [name], [name]                       MRN:          [id number]  DATE:06/11/2007                            DOB:          1956/03/01    [name] is a 55 year old white female with Crohn disease of the colon,  status post colon resection for a colonic stricture in 1996.  Currently  with predominantly rectal disease, history of rectal drainage, rectal  fistula, and stricture.  Last dilatation of the rectal stricture was in  July 2007.  She is having some difficulty with evacuation.  Her stools  are small in caliber and sometimes are difficult to evacuate.  She is  having up to 6-7 soft bowel movements a day.  The patient has been on  Humira 40 mg every 2 weeks since March 2008, with good relief of her  rectal disease.  She still takes oxycodone 7.5 mg one or two a day for  rectal pain but overall she has done quite well.  She also had a  cholecystectomy in 1997.  Crohn disease was initially diagnosed in 55.  She was on Remicade between 2001 and 2002.   PHYSICAL EXAMINATION:  VITAL SIGNS:  Blood pressure 110/64, pulse of 72,  weight 103 pounds which represents 6 pounds weight gain since last exam.  GENERAL:  She was alert and oriented in no distress.  LUNGS:  Clear to auscultation.  COR:  Normal S1, normal S2.  ABDOMEN:  Soft, flat, normoactive bowel sounds.  No distention.  Liver  edge at costal margin.  No tenderness.  RECTAL:  Perianal area with large skin tags and closed up anal orifice.  I could not fit my finger in the rectum.  There was some mild purulent  drainage from the rectal os.   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  Last  colonoscopy showed no evidence of active disease except for the rectum.   PLAN:  1. Continue Humira 40 mg every 2 weeks.  2. Continue Cipro  250 mg p.o. daily.  She may need a refill on her      Cipro 250 a day 30 a month, six refills.  3. Rectal care. We will schedule the patient for the dilatation of the      stricture under conscious sedation.  The last one was done July      2007.     Hedwig Morton. Juanda Chance, MD  Electronically Signed    DMB/MedQ  DD: 06/11/2007  DT: 06/11/2007  Job #: [id number]

## 2010-07-20 NOTE — Telephone Encounter (Signed)
Called [place] with Dr. Regino Schultze answer.

## 2010-07-20 NOTE — Op Note (Signed)
[name], Martha                ACCOUNT NO.:  [id number]   MEDICAL RECORD NO.:  [id number]          PATIENT TYPE:  INP   LOCATION:  0001                         FACILITY:  [facility]   PHYSICIAN:  Thornton Park. Daphine Deutscher, MD  DATE OF BIRTH:  24-Sep-1955   DATE OF PROCEDURE:  10/14/2008  DATE OF DISCHARGE:                               OPERATIVE REPORT   PREOPERATIVE DIAGNOSES:  Crohn disease with multiple anal and perirectal  fistulae.   POSTOPERATIVE DIAGNOSES:  Crohn disease with multiple anal and  perirectal fistulae.   PROCEDURE:  Laparoscopic end colostomy in the left lower quadrant.   SURGEON:  Thornton Park. Daphine Deutscher, MD.   ASSISTANTMarland Kitchen  Sandria Bales. Ezzard Standing, M.D.   ANESTHESIA:  General endotracheal.   DESCRIPTION OF PROCEDURE:  [name] is a 55 year old lady taken to room  one on Tuesday, October 14, 2008, and given general anesthesia.  The  abdomen was prepped widely with Techni-Care and draped sterilely.  Preoperatively, she had a mark placed in her left lower quadrant for her  ostomy site.  We entered the abdomen through the right upper quadrant  using the 5 mm OptiView, insufflated and then placed another 5 in the  right lower quadrant and another 5 through the port.  I took down some  adhesions in the midline with sharp dissection.  There were a couple of  bleeders that I controlled with the Harmonic.  I then identified the  midsigmoid colon and found that to be fairly mobile.  I dissected  through the mesentery and then divided the colon with the Echelon blue  load stapler.  The remaining portion was transected.  It appeared that  the staple lines went all the away across.  I went ahead and then placed  and the Endoclose loops in the 12 that I placing in the left lower  quadrant and then I incised the skin and created a hole for the ostomy.  When this had been done I was grasping the end of the sigmoid and  brought it up into the wound and out to the skin surface.  I then fixed  it to  the fascia with horizontal mattress sutures of 2-0 Vicryl.  It was  then matured to the skin with a running 3-0 chromic.  The ports in the  meantime had been closed with 4-0 Vicryl.  The patient seemed to  tolerate the procedure well and was taken to the recovery room in  satisfactory condition.  The port sites were injected with some  Marcaine.      Thornton Park Daphine Deutscher, MD  Electronically Signed    MBM/MEDQ  D:  10/14/2008  T:  10/14/2008  Job:  [id number]   cc:   Hedwig Morton. Juanda Chance, MD  [address]  [place]  [place] [address]

## 2010-07-23 NOTE — Op Note (Signed)
NAME:  [name], [name]                          ACCOUNT NO.:  [id number]   MEDICAL RECORD NO.:  [id number]                   PATIENT TYPE:  AMB   LOCATION:  ENDO                                 [facility]ACILITY[facility]  [facility]   PHYSICIAN:  [name], M.D. [facility]               DATE OF BIRTH:  Apr 29, 1955   DATE OF PROCEDURE:  11/05/2002  DATE OF DISCHARGE:                                 OPERATIVE REPORT   NAME OF PROCEDURE:  Flexible sigmoidoscopy with rectal dilatation.   INDICATIONS:  This 55 year old white female has anorectal Crohn's disease  which has been treated over the years and she has developed rectal  stricture.  The patient has had chronic rectal pain related to active  Crohn's disease of the rectum.  She has been on prednisone, Pentasa  suppositories, and sitz baths.  She is now undergoing flexible sigmoidoscopy  and dilatation of the rectal stricture.   ENDOSCOPE:  Olympus single-channel video endoscope.   SEDATION:  Versed 10 mg IV and fentanyl 100 mcg IV.   FINDINGS:  The Olympus single-channel video endoscope was passed under  direct vision through rectum to the sigmoid colon.  The patient was  monitored by pulse oximetry and oxygen saturations were normal.  Her prep  was adequate.  The perianal area showed no evidence of fistula.  There was a  large amount of external hemorrhoidal tags protruding from the anal canal.  The anal canal itself was mildly narrowed and continued multiple superficial  ulcerations.  It was somewhat deformed, but I was able to insert the  endoscope through.  The rectal ampulla was involved with active Crohn's  disease with multiple aphthous ulcerations, some of them rather large.  Also, irregularity of the wall indicated some fibrosis.  The erythema  extended up to 20 cm from the rectum.  First 20 cm the mucosa was entirely  normal.  There was a normal-appearing vascular pattern.  The endoscope  traversed up to 60 cm, at which point the colonoscope was  then retracted.  Metal anal dilators were used to open up the stricture using 15 mm, 16 mm,  17 mm, and 18 mm dilators.  The patient had some pain and discomfort during  the dilatations and there was some blood from the orifice of the rectum.  Re-  endoscopy of the rectum after dilatation confirmed successful dilatation of  rectal stricture.   IMPRESSION:  1. 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  2. Crohn's colitis, status post biopsies, status post dilatation to 18 mm.   PLAN:  1. Pentasa suppositories three times a day.  2. Start tapering of prednisone by 5 mg every two weeks.  3. Will schedule the patient for Remicade infusions.  [name], M.D. [facility]    DB/MEDQ  D:  11/05/2002  T:  11/05/2002  Job:  [id number]

## 2010-07-23 NOTE — Assessment & Plan Note (Signed)
[facility]                           GASTROENTEROLOGY OFFICE NOTE   [name], [name]                       MRN:          [id number]  DATE:11/01/2005                            DOB:          07-22-1955    [name] is a 55 year old white female with Crohn disease involving rectum.  She has chronic rectal pain and involvement of the rectum with granulomas.  She has a rectal stricture which was dilated on September 26, 2005 using [facility]  dilators.  The patient had a lot of pain for about 2-3 days following the  procedure but her diarrhea has improved and she is back to her baseline of  taking two Percocet a day for rectal pain.  Surprisingly, there was not much  activity in her Crohn disease, most of the ulcerations were in the rectum.  Histologically, there was some scattered granuloma throughout the random  biopsies of the colon.   MEDICATIONS:  1. Imuran 75 mg p.o. b.i.d.  2. Asacol 1.2 grams b.i.d.  3. She has completed Cipro.  4. Percocet two a day.  5. She was started on Humira 40 mg q.2 weeks on September 2.   PHYSICAL EXAM:  Blood pressure 92/58, pulse 64, weight 102 pounds and  stable.  Alert and oriented in no distress.  LUNGS:  Clear to auscultation.  COR:  Normal S1, S2.  ABDOMEN:  Soft, nontender, with normoactive bowel sounds.  No distension.  RECTAL:  Exam not done today.   IMPRESSION:  A 55 year old white female with Crohn disease of the rectum  with a rectal stricture.  She would be a good candidate for diverting  colostomy since she has minimal disease in the colon and has progressive  rectal disease over the years.  I have been following her for the past 20  years and I feel that she would most likely benefit from diverting colostomy  which most likely would be permanent.  We have discussed it today and she is  not opposed to it but would like to try the Humira first.  It seemed to have  helped quite a lot when she was on Humira  last year.  She had to discontinue  the treatment because of financial assistance.  Now, she will have the  financial assistance which will be available to her and we will try her for  at least 6 months.  If it does not work, I think she should be considered  for diverting colostomy.                                   [name]. Martha Chance, MD   DMB/MedQ  DD:  11/01/2005  DT:  11/02/2005  Job #:  [id number]   cc:   [facility]

## 2010-07-23 NOTE — Assessment & Plan Note (Signed)
[facility]                           GASTROENTEROLOGY OFFICE NOTE   MARLEI, GLOMSKI                       MRN:          [id number]  DATE:12/21/2005                            DOB:          [birth year]    Ms. 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  She has applied for  Humira injections, but we have not been able to start it yet because of a  lack of funds.  In the meantime we have been able to increase her Imuran to  150 mg a day, and she has done much better.  Her diarrhea has decreased.  Her need for Percocet really has decreased as well, so she is generally  happy.  Weight has remained stable.  She is eating.  Has no nausea or  abdominal pain.   PHYSICAL EXAM:  Blood pressure 108/62, pulse 64, and weight 130 pounds.  LUNGS:  Clear to auscultation.  COR:  Normal S1 and S2.  ABDOMEN:  Soft and nontender.  No mass.  No distension.  RECTAL:  Exam not done today.   IMPRESSION:  A 55 year old white female with Crohn's disease of the rectum,  improved on increased dose of .   PLAN:  1. Await Humira availability.  2. Continue 150 mg a day.  3. Continue Asacol 3.6 g a day.  Add Flagyl 250 mg p.o. b.i.d.       Hedwig Morton. Juanda Chance, MD      DMB/MedQ  DD:  12/21/2005  DT:  12/22/2005  Job #:  [id number]   cc:   [facility]

## 2010-07-29 ENCOUNTER — Other Ambulatory Visit: Payer: Self-pay | Admitting: Internal Medicine

## 2010-08-06 ENCOUNTER — Other Ambulatory Visit: Payer: Self-pay | Admitting: Internal Medicine

## 2010-08-06 MED ORDER — ADALIMUMAB 40 MG/0.8ML ~~LOC~~ KIT: PACK | SUBCUTANEOUS | Status: DC

## 2010-08-06 NOTE — Telephone Encounter (Signed)
rx sent to [facility] pharmacy.

## 2010-09-27 ENCOUNTER — Other Ambulatory Visit: Payer: Self-pay | Admitting: Internal Medicine

## 2010-10-25 ENCOUNTER — Other Ambulatory Visit: Payer: Self-pay | Admitting: Internal Medicine

## 2010-11-23 ENCOUNTER — Other Ambulatory Visit: Payer: Self-pay | Admitting: Internal Medicine

## 2010-11-25 ENCOUNTER — Telehealth: Payer: Self-pay | Admitting: Internal Medicine

## 2010-11-25 NOTE — Telephone Encounter (Signed)
Left message for pt that rx was faxed on 11/23/10 but we will send it again as the pharmacy apparently did not get rx.

## 2010-11-26 ENCOUNTER — Telehealth: Payer: Self-pay | Admitting: Internal Medicine

## 2010-11-26 NOTE — Telephone Encounter (Signed)
Called pharmacy and they state that they received the fax last night.

## 2010-12-22 ENCOUNTER — Other Ambulatory Visit: Payer: Self-pay | Admitting: Internal Medicine

## 2011-01-20 ENCOUNTER — Telehealth: Payer: Self-pay | Admitting: Internal Medicine

## 2011-01-20 ENCOUNTER — Other Ambulatory Visit: Payer: Self-pay | Admitting: Internal Medicine

## 2011-01-20 MED ORDER — MESALAMINE 400 MG PO TBEC: DELAYED_RELEASE_TABLET | ORAL | Status: DC

## 2011-01-20 NOTE — Telephone Encounter (Signed)
asacol samples have been placed at the front desk per patient request.

## 2011-02-17 ENCOUNTER — Other Ambulatory Visit: Payer: Self-pay | Admitting: Internal Medicine

## 2011-03-16 ENCOUNTER — Other Ambulatory Visit: Payer: Self-pay | Admitting: Internal Medicine

## 2011-03-17 ENCOUNTER — Other Ambulatory Visit: Payer: Self-pay | Admitting: Internal Medicine

## 2011-03-17 NOTE — Telephone Encounter (Signed)
Patient advised that her rx was sent on 03/16/11. I have called pharmacy to confirm that rx will be ready for her.

## 2011-04-05 ENCOUNTER — Encounter: Payer: Self-pay | Admitting: Internal Medicine

## 2011-04-05 ENCOUNTER — Other Ambulatory Visit (INDEPENDENT_AMBULATORY_CARE_PROVIDER_SITE_OTHER): Payer: Medicare Other

## 2011-04-05 ENCOUNTER — Ambulatory Visit (INDEPENDENT_AMBULATORY_CARE_PROVIDER_SITE_OTHER): Payer: Medicare Other | Admitting: Internal Medicine

## 2011-04-05 VITALS — BP 120/66 | HR 76 | Ht 59.0 in | Wt 106.6 lb

## 2011-04-05 DIAGNOSIS — K5010 Crohn's disease of large intestine without complications: Secondary | ICD-10-CM

## 2011-04-05 LAB — CBC WITH DIFFERENTIAL/PLATELET
Basophils Absolute: 0.1 10*3/uL (ref 0.0–0.1)
Eosinophils Absolute: 0.2 10*3/uL (ref 0.0–0.7)
Lymphs Abs: 3.2 10*3/uL (ref 0.7–4.0)
MCHC: 34.5 g/dL (ref 30.0–36.0)
MCV: 90.2 fl (ref 78.0–100.0)
Neutro Abs: 4.7 10*3/uL (ref 1.4–7.7)
RBC: 4.31 Mil/uL (ref 3.87–5.11)
RDW: 13.5 % (ref 11.5–14.6)
WBC: 8.7 10*3/uL (ref 4.5–10.5)

## 2011-04-05 MED ORDER — MESALAMINE 800 MG PO TBEC: 1.0000 | DELAYED_RELEASE_TABLET | Freq: Two times a day (BID) | ORAL | Status: DC

## 2011-04-05 MED ORDER — AMOXICILLIN 500 MG PO CAPS: 500.0000 mg | ORAL_CAPSULE | Freq: Every day | ORAL | Status: AC

## 2011-04-05 MED ORDER — HYDROCODONE-ACETAMINOPHEN 5-500 MG PO TABS: 1.0000 | ORAL_TABLET | Freq: Two times a day (BID) | ORAL | Status: DC | PRN

## 2011-04-05 NOTE — Patient Instructions (Signed)
We have sent the following medications to your pharmacy for you to pick up at your convenience: Amoxicillin  Hydrocodone We have given you samples of Asacol HD to take 2 tablets daily Your physician has requested that you go to the basement for the following lab work before leaving today: CBC, CMET, Sedimentation Rate You have been scheduled for a CT scan of the abdomen and pelvis at [facility] ([address]---this is in the same building as Architectural technologist).   You are scheduled on Tuesday 04/12/11 at 1:30 pm. You should arrive 15 minutes prior to your appointment time for registration. Please follow the written instructions below on the day of your exam:  WARNING: IF YOU ARE ALLERGIC TO IODINE/X-RAY DYE, PLEASE NOTIFY RADIOLOGY IMMEDIATELY AT [phone]! YOU WILL BE GIVEN A 13 HOUR PREMEDICATION PREP.  1) Do not eat or drink anything after 9:30 am (4 hours prior to your test) 2) You have been given 2 bottles of oral contrast to drink. The solution may taste better if refrigerated, but do NOT add ice or any other liquid to this solution. Shake well before drinking.    Drink 1 bottle of contrast @ 11:30 am (2 hours prior to your exam)  Drink 1 bottle of contrast @ 12:30 pm (1 hour prior to your exam)  You may take any medications as prescribed with a small amount of water except for the following: Metformin, Glucophage, Glucovance, Avandamet, Riomet, Fortamet, Actoplus Met, Janumet, Glumetza or Metaglip. The above medications must be held the day of the exam AND 48 hours after the exam.  The purpose of you drinking the oral contrast is to aid in the visualization of your intestinal tract. The contrast solution may cause some diarrhea. Before your exam is started, you will be given a small amount of fluid to drink. Depending on your individual set of symptoms, you may also receive an intravenous injection of x-ray contrast/dye. Plan on being at [facility] for 30 minutes  or long, depending on the type of exam you are having performed.  If you have any questions regarding your exam or if you need to reschedule, you may call the CT department at [phone] between the hours of 8:00 am and 5:00 pm, Monday-Friday.  ________________________________________________________________________

## 2011-04-05 NOTE — Progress Notes (Signed)
[name] 03/13/1955 MRN [id number]   History of Present Illness:  This is a 56 year old white female with Crohn's disease of the terminal ileum, colon, and rectum since 1986. She is status post sigmoid stricture resection in 1996 and diverting colostomy in August 2010. She is now back on Humira 40 mg every 2 weeks. She is also on Asacol HD 800 mg, 1 po bid. She is having abdominal pain and diarrhea via colostomy. Her last colonoscopy in March 2011 showed mild chronic colitis. She has lost about 8 pounds since her last appointment. She could not afford her Imuran. She denies nausea or vomiting. Her appetite has been decreased. She is under a lot of stress because of her daughter's recent illness and death or her father. She is on full disability. She denies any visible blood per colostomy.   Past Medical History  Diagnosis Date  . Ulcerative colitis, unspecified   . Coronary atherosclerosis of unspecified type of vessel, native or graft   . Anal fistula    Past Surgical History  Procedure Date  . Cholecystectomy   . Colon resection   . Angioplasty   . Appendectomy     reports that she has quit smoking. Her smoking use included Cigarettes. She does not have any smokeless tobacco history on file. She reports that she does not drink alcohol or use illicit drugs. family history includes Heart disease in her brother and mother and Lung cancer in her paternal uncle.  There is no history of Colon polyps. Allergies  Allergen Reactions  . Infliximab     convulsions        Review of Systems: Negative for dysphagia, odynophagia, heartburn  The remainder of the 10 point ROS is negative except as outlined in H&P   Physical Exam: General appearance  Well developed, in no distress. Chronically ill-appearing Eyes- non icteric. HEENT nontraumatic, normocephalic. Mouth no lesions, tongue papillated, no cheilosis. Edentulous Neck supple without adenopathy, thyroid not enlarged, no carotid  bruits, no JVD. Lungs Clear to auscultation bilaterally. Cor normal S1, normal S2, regular rhythm, no murmur,  quiet precordium. Abdomen: Soft with normoactive bowel sounds. No distention. Mild tenderness to the left and right of the ileostomy. Rectal: Digital exam of the ileostomy shows firm stoma with brown Hemoccult negative stool digital exam of the rectum shows large skin tags outside of the rectal orifice. Focal erythema but no evidence of fistula Extremities no pedal edema. Skin no lesions. Neurological alert and oriented x 3. Psychological normal mood and affect.  Assessment and Plan:   Problem #1 Crohn's disease of the colon, terminal ileum and rectum which is under reasonable control. She has had a recent increase in abdominal pain associated with weight loss. Her last CT scan of the abdomen was in December 2011. We will proceed with a repeat CT scan to rule out a fistula or recurrent disease. We will start her on amoxicillin 500 mg daily for 10 days. She is to continue Humira 40 mg IM every 2 weeks. We have given her samples of Asacol HD 800 mg to take twice a day. Today, we will check labs. I will see her in 3 months.   04/05/2011 [name]

## 2011-04-06 ENCOUNTER — Encounter: Payer: Self-pay | Admitting: Internal Medicine

## 2011-04-08 ENCOUNTER — Telehealth: Payer: Self-pay | Admitting: *Deleted

## 2011-04-08 LAB — COMPREHENSIVE METABOLIC PANEL
ALT: 11 U/L (ref 0–35)
AST: 18 U/L (ref 0–37)
BUN: 9 mg/dL (ref 6–23)
Creatinine, Ser: 0.8 mg/dL (ref 0.4–1.2)
GFR: 77.66 mL/min (ref >60.00)
Total Bilirubin: 0.2 mg/dL — ABNORMAL LOW (ref 0.3–1.2)

## 2011-04-08 NOTE — Telephone Encounter (Signed)
Message copied by Daphine Deutscher on Fri Apr 08, 2011  3:42 PM ------      Message from: Hart Carwin      Created: Fri Apr 08, 2011 12:13 PM       Please call pt with normal blood tests, sed rate still elevated due to Crohn's disease. Continue same meds, drink lot of fluids

## 2011-04-08 NOTE — Telephone Encounter (Signed)
Spoke with patient and gave her results and recommendations. 

## 2011-04-12 ENCOUNTER — Ambulatory Visit (INDEPENDENT_AMBULATORY_CARE_PROVIDER_SITE_OTHER)
Admission: RE | Admit: 2011-04-12 | Discharge: 2011-04-12 | Disposition: A | Discharge status: Home / Self Care | Payer: Medicare Other | Source: Ambulatory Visit | Attending: Internal Medicine | Admitting: Internal Medicine

## 2011-04-12 DIAGNOSIS — K5010 Crohn's disease of large intestine without complications: Secondary | ICD-10-CM

## 2011-04-12 MED ORDER — IOHEXOL 300 MG/ML  SOLN
100.0000 mL | Freq: Once | INTRAMUSCULAR | Status: AC | PRN
  Administered 2011-04-12: 100 mL via INTRAVENOUS

## 2011-04-13 ENCOUNTER — Telehealth: Payer: Self-pay | Admitting: *Deleted

## 2011-04-13 ENCOUNTER — Other Ambulatory Visit: Payer: Medicare Other

## 2011-04-13 DIAGNOSIS — K529 Noninfective gastroenteritis and colitis, unspecified: Secondary | ICD-10-CM

## 2011-04-13 MED ORDER — MESALAMINE 800 MG PO TBEC: DELAYED_RELEASE_TABLET | ORAL | Status: DC

## 2011-04-13 NOTE — Telephone Encounter (Signed)
Spoke with patient and gave her results of CT and recommendations per Dr. Juanda Chance. She will pick up specimen containers today. She has enough Asacal HD for now.

## 2011-04-13 NOTE — Telephone Encounter (Signed)
Patient has decided to pick up Asacol HD samples. Samples given to patient.

## 2011-04-13 NOTE — Telephone Encounter (Signed)
Message copied by Daphine Deutscher on Wed Apr 13, 2011 11:12 AM ------      Message from: Hart Carwin      Created: Tue Apr 12, 2011 10:31 PM       Please call pt with results of the CT scan which shows diffuse colitis in her colon. Please obtain stool C.diff, stool O&P and stool culture via colostomy.Please increase her Asacal to 3.2 gm/day ( either four of the HD tablets or 8 of the 400 mg tabs)- we gave her samples. I am considering putting her back on Prednisone if the stools studies are negative for infection.Give her more samples if necessary.

## 2011-04-15 LAB — OVA AND PARASITE SCREEN: OP: NONE SEEN

## 2011-04-15 LAB — CLOSTRIDIUM DIFFICILE BY PCR: Toxigenic C. Difficile by PCR: NOT DETECTED

## 2011-04-18 LAB — STOOL CULTURE

## 2011-04-26 ENCOUNTER — Other Ambulatory Visit: Payer: Self-pay | Admitting: Internal Medicine

## 2011-05-05 ENCOUNTER — Other Ambulatory Visit: Payer: Self-pay | Admitting: Internal Medicine

## 2011-05-10 ENCOUNTER — Ambulatory Visit (INDEPENDENT_AMBULATORY_CARE_PROVIDER_SITE_OTHER): Payer: Medicare Other | Admitting: Internal Medicine

## 2011-05-10 DIAGNOSIS — K5090 Crohn's disease, unspecified, without complications: Secondary | ICD-10-CM

## 2011-05-17 ENCOUNTER — Ambulatory Visit (INDEPENDENT_AMBULATORY_CARE_PROVIDER_SITE_OTHER): Payer: Medicare Other | Admitting: Internal Medicine

## 2011-05-17 DIAGNOSIS — K5190 Ulcerative colitis, unspecified, without complications: Secondary | ICD-10-CM

## 2011-06-03 ENCOUNTER — Other Ambulatory Visit: Payer: Self-pay | Admitting: Internal Medicine

## 2011-07-01 ENCOUNTER — Other Ambulatory Visit: Payer: Self-pay | Admitting: Internal Medicine

## 2011-07-04 ENCOUNTER — Telehealth: Payer: Self-pay | Admitting: Internal Medicine

## 2011-07-04 MED ORDER — HYDROCODONE-ACETAMINOPHEN 5-500 MG PO TABS: 1.0000 | ORAL_TABLET | Freq: Two times a day (BID) | ORAL | Status: DC | PRN

## 2011-07-04 NOTE — Telephone Encounter (Signed)
Patient was scheduled as a new patient. She is not a new patient so she was scheduled for an appointment 08/05/11 and she verbalizes understanding. Rx sent until her appointment on 08/05/11

## 2011-08-05 ENCOUNTER — Ambulatory Visit (INDEPENDENT_AMBULATORY_CARE_PROVIDER_SITE_OTHER): Payer: Medicare Other | Admitting: Internal Medicine

## 2011-08-05 ENCOUNTER — Other Ambulatory Visit (INDEPENDENT_AMBULATORY_CARE_PROVIDER_SITE_OTHER): Payer: Medicare Other

## 2011-08-05 ENCOUNTER — Encounter: Payer: Self-pay | Admitting: Internal Medicine

## 2011-08-05 ENCOUNTER — Other Ambulatory Visit: Payer: Self-pay | Admitting: Internal Medicine

## 2011-08-05 VITALS — BP 92/60 | HR 80 | Ht 59.0 in | Wt 99.0 lb

## 2011-08-05 DIAGNOSIS — R933 Abnormal findings on diagnostic imaging of other parts of digestive tract: Secondary | ICD-10-CM

## 2011-08-05 DIAGNOSIS — K5090 Crohn's disease, unspecified, without complications: Secondary | ICD-10-CM

## 2011-08-05 LAB — CBC WITH DIFFERENTIAL/PLATELET
Basophils Relative: 1 % (ref 0.0–3.0)
Eosinophils Relative: 1.1 % (ref 0.0–5.0)
HCT: 37.9 % (ref 36.0–46.0)
Hemoglobin: 12.7 g/dL (ref 12.0–15.0)
Lymphs Abs: 3.4 10*3/uL (ref 0.7–4.0)
Monocytes Relative: 5.8 % (ref 3.0–12.0)
Neutro Abs: 4.3 10*3/uL (ref 1.4–7.7)
RBC: 4.26 Mil/uL (ref 3.87–5.11)
WBC: 8.4 10*3/uL (ref 4.5–10.5)

## 2011-08-05 LAB — SEDIMENTATION RATE: Sed Rate: 39 mm/hr — ABNORMAL HIGH (ref 0–22)

## 2011-08-05 MED ORDER — HYDROCODONE-ACETAMINOPHEN 5-500 MG PO TABS: 1.0000 | ORAL_TABLET | Freq: Two times a day (BID) | ORAL | Status: DC | PRN

## 2011-08-05 MED ORDER — PREDNISONE 10 MG PO TABS: ORAL_TABLET | ORAL | Status: DC

## 2011-08-05 MED ORDER — ADALIMUMAB 40 MG/0.8ML ~~LOC~~ KIT: 40.0000 mg | PACK | SUBCUTANEOUS | Status: DC

## 2011-08-05 NOTE — Progress Notes (Signed)
[name] [birth year] MRN [id number]   History of Present Illness:  This is a 56 year old white female with Crohn's disease of the terminal ileum, colon and rectum since 1986. She had a sigmoid colon stricture resected in 1996 and she had a diverting colostomy in August 2010 for rectal disease and chronic rectal pain. She has been on Humira 40 mg every 2 weeks, Asacol HD 3.6 g daily, Bentyl and hydrocodone for abdominal pain. She continues to lose weight due to diarrhea and she also continues to have a small amount of drainage through the rectum. Her stress level has somewhat diminished since her daughter had her gallbladder removed and is doing better. Her last office visit in January 2013 showed a sedimentation rate of 57, and total protein elevated to 8.7 with normal serum albumin. Her CT scan of the abdomen showed a thickened descending colon consistent with active colitis, normal small bowel, severe aorto iliofemoral atherosclerosis and osteopenia.   Past Medical History  Diagnosis Date  . Ulcerative colitis, unspecified   . Coronary atherosclerosis of unspecified type of vessel, native or graft   . Anal fistula    Past Surgical History  Procedure Date  . Cholecystectomy   . Colon resection   . Angioplasty   . Appendectomy     reports that she has quit smoking. Her smoking use included Cigarettes. She does not have any smokeless tobacco history on file. She reports that she does not drink alcohol or use illicit drugs. family history includes Heart disease in her brother and mother and Lung cancer in her paternal uncle.  There is no history of Colon polyps. Allergies  Allergen Reactions  . Infliximab     convulsions        Review of Systems: Denies heartburn, dysphagia odynophagia or chest pain  The remainder of the 10 point ROS is negative except as outlined in H&P weight loss of 7 pounds since last visit   Physical Exam: General appearance  small stature, in no  distress chronically ill appearing. Eyes- non icteric. HEENT nontraumatic, normocephalic. Mouth no lesions, tongue papillated, no cheilosis. Neck supple without adenopathy, thyroid not enlarged, no carotid bruits, no JVD. Lungs Clear to auscultation bilaterally. Cor normal S1, normal S2, regular rhythm, no murmur,  quiet precordium. Abdomen: Soft, tender along left lower quadrant. Colostomy of left upper quadrant. Digital exam of the stoma shows Hemoccult negative stool. Rectal: No fistula. Extremities no pedal edema. Skin no lesions. Neurological alert and oriented x 3. Psychological normal mood and affect.  Assessment and Plan:  Problem #1 Active Crohn's disease of the descending colon documented a CT scan of the abdomen as well as based on patient's symptoms of continued diarrhea and weight loss. We will increase her Humira to 40 mg weekly and will also start her on prednisone 30 mg daily. We will refill the rest of her medications. I will see her in 8 weeks.  Problem #2 Increased total protein to 8.7. We need to rule out paraproteinemia.  We will obtain a serum protein electrophoresis and immunoelectrophoresis as well.    08/05/2011 [name]

## 2011-08-05 NOTE — Patient Instructions (Addendum)
We have sent the following medications to your pharmacy for you to pick up at your convenience: Serum Protein Electroporesis, Immunoelectroporesis, CBC, Sedimentation Rate We have sent the following medications to your pharmacy for you to pick up at your convenience: Prednisone (3 tablets daily) Hydrocodone We will change your Humira prescription to once weekly. OV 8 weeks

## 2011-08-08 ENCOUNTER — Telehealth: Payer: Self-pay | Admitting: *Deleted

## 2011-08-08 NOTE — Telephone Encounter (Signed)
Notes Recorded by Hart Carwin, MD on 08/05/2011 at 11:49 PM Please call pt with stable CBC, continue with the plan As recommended during her OV    I have spoken with patient to advise that her bloodwork is stable and to continue with recommendations she got at her office visit last week. She verbalizes understanding.

## 2011-08-09 LAB — SPEP & IFE WITH QIG
Albumin ELP: 49.4 % — ABNORMAL LOW (ref 55.8–66.1)
Alpha-1-Globulin: 8.3 % — ABNORMAL HIGH (ref 2.9–4.9)
Gamma Globulin: 18.2 % (ref 11.1–18.8)
IgM, Serum: 160 mg/dL (ref 52–322)

## 2011-08-16 ENCOUNTER — Other Ambulatory Visit: Payer: Self-pay | Admitting: Internal Medicine

## 2011-08-16 MED ORDER — DIAZEPAM 5 MG PO TABS: 5.0000 mg | ORAL_TABLET | Freq: Every day | ORAL | Status: DC | PRN

## 2011-08-16 NOTE — Telephone Encounter (Signed)
OK to refill Diazepam 5 mg, #30, 1 po qd prn anxiety, 1 refill

## 2011-08-16 NOTE — Telephone Encounter (Signed)
I have spoken to patient to advise that we have sent Diazepam to her pharmacy. She has also scheduled an appointment with Dr Juanda Chance for follow up.

## 2011-08-16 NOTE — Telephone Encounter (Signed)
Dr Juanda Chance- Patient request diazepam, however, it appears that we have not given her a script of this since 2011. Would you like me to refill rx for diazepam again?

## 2011-08-31 ENCOUNTER — Telehealth: Payer: Self-pay | Admitting: Internal Medicine

## 2011-08-31 MED ORDER — ADALIMUMAB 40 MG/0.8ML ~~LOC~~ KIT: 40.0000 mg | PACK | SUBCUTANEOUS | Status: DC

## 2011-08-31 NOTE — Telephone Encounter (Signed)
rx resent to pharmacy. Patient advised.

## 2011-09-09 ENCOUNTER — Ambulatory Visit: Payer: Medicare Other | Admitting: Internal Medicine

## 2011-09-28 ENCOUNTER — Other Ambulatory Visit: Payer: Self-pay | Admitting: Internal Medicine

## 2011-10-04 ENCOUNTER — Ambulatory Visit (INDEPENDENT_AMBULATORY_CARE_PROVIDER_SITE_OTHER): Payer: Medicare Other | Admitting: Internal Medicine

## 2011-10-04 ENCOUNTER — Other Ambulatory Visit (INDEPENDENT_AMBULATORY_CARE_PROVIDER_SITE_OTHER): Payer: Medicare Other

## 2011-10-04 ENCOUNTER — Encounter: Payer: Self-pay | Admitting: Internal Medicine

## 2011-10-04 VITALS — BP 90/58 | HR 80 | Ht 59.0 in | Wt 102.2 lb

## 2011-10-04 DIAGNOSIS — K5090 Crohn's disease, unspecified, without complications: Secondary | ICD-10-CM

## 2011-10-04 DIAGNOSIS — Z79899 Other long term (current) drug therapy: Secondary | ICD-10-CM

## 2011-10-04 DIAGNOSIS — Z933 Colostomy status: Secondary | ICD-10-CM

## 2011-10-04 DIAGNOSIS — D849 Immunodeficiency, unspecified: Secondary | ICD-10-CM

## 2011-10-04 LAB — CBC WITH DIFFERENTIAL/PLATELET
Basophils Absolute: 0 10*3/uL (ref 0.0–0.1)
Eosinophils Absolute: 0 10*3/uL (ref 0.0–0.7)
HCT: 40.4 % (ref 36.0–46.0)
Lymphs Abs: 2.4 10*3/uL (ref 0.7–4.0)
MCV: 90.5 fl (ref 78.0–100.0)
Monocytes Absolute: 0.3 10*3/uL (ref 0.1–1.0)
Neutrophils Relative %: 79.1 % — ABNORMAL HIGH (ref 43.0–77.0)
Platelets: 356 10*3/uL (ref 150.0–400.0)
RDW: 13.4 % (ref 11.5–14.6)
WBC: 13 10*3/uL — ABNORMAL HIGH (ref 4.5–10.5)

## 2011-10-04 LAB — SEDIMENTATION RATE: Sed Rate: 25 mm/hr — ABNORMAL HIGH (ref 0–22)

## 2011-10-04 MED ORDER — METRONIDAZOLE 250 MG PO TABS: 250.0000 mg | ORAL_TABLET | Freq: Two times a day (BID) | ORAL | Status: AC

## 2011-10-04 MED ORDER — DIAZEPAM 5 MG PO TABS: 5.0000 mg | ORAL_TABLET | Freq: Every day | ORAL | Status: DC | PRN

## 2011-10-04 MED ORDER — DICYCLOMINE HCL 20 MG PO TABS: ORAL_TABLET | ORAL | Status: DC

## 2011-10-04 MED ORDER — HYDROCODONE-ACETAMINOPHEN 5-500 MG PO TABS: 1.0000 | ORAL_TABLET | Freq: Two times a day (BID) | ORAL | Status: DC | PRN

## 2011-10-04 MED ORDER — PREDNISONE 10 MG PO TABS: ORAL_TABLET | ORAL | Status: DC

## 2011-10-04 NOTE — Patient Instructions (Addendum)
We have sent the following medications to your pharmacy for you to pick up at your convenience: Prednisone (15 mg daily x 1 month) Dicyclomine Valium (may be filled 10/15/11) Hydrocodone (may be filled 11/05/11) Flagyl Your physician has requested that you go to the basement for the following lab work before leaving today: CBC, Sed Rate Please schedule a follow up visit with Dr Juanda Chance for 2 months from now.

## 2011-10-04 NOTE — Progress Notes (Signed)
[name] 03/29/1955 MRN [id number]   History of Present Illness:  This is a 56 year old, white female with Crohn's disease of the rectum, colon and terminal ileum. Her last office visit was on 08/05/2011. Her Crohn's disease was diagnosed in 69. She had a colonic stricture resected in 1996 and a diverting colostomy in August 2010 for severe rectal pain and rectal dysfunction. She has been on Humira 40 mg weekly, Asacol 3.6 g a day,  hydrocodone and prednisone taper which is currently down to 20 mg daily. A CT scan of the abdomen in January 2013 showed active colitis in the descending colon with severe iliofemoral atherosclerosis. She is also status post remote cholecystectomy. She is about 40% improved. She no longer sees any blood. Her abdominal pain has decreased and she has gained 3 pounds. Her last colonoscopy via colostomy March 2011 was normal.   Past Medical History  Diagnosis Date  . Ulcerative colitis, unspecified   . Coronary atherosclerosis of unspecified type of vessel, native or graft   . Anal fistula   . Crohn's colitis    Past Surgical History  Procedure Date  . Cholecystectomy   . Colon resection   . Angioplasty   . Appendectomy     reports that she has quit smoking. Her smoking use included Cigarettes. She does not have any smokeless tobacco history on file. She reports that she does not drink alcohol or use illicit drugs. family history includes Heart disease in her brother and mother and Lung cancer in her paternal uncle.  There is no history of Colon polyps. Allergies  Allergen Reactions  . Remicade (Infliximab)     convulsions        Review of Systems: Weight gain 3 pounds, denies heartburn dysphagia  The remainder of the 10 point ROS is negative except as outlined in H&P   Physical Exam: General appearance  Well developed, in no distress. Chronically ill-appearing somewhat cushingoid Eyes- non icteric. HEENT nontraumatic, normocephalic. Mouth no  lesions, tongue papillated, no cheilosis. Neck supple without adenopathy, thyroid not enlarged, no carotid bruits, no JVD. Lungs Clear to auscultation bilaterally. Cor normal S1, normal S2, regular rhythm, no murmur,  quiet precordium. Abdomen: Colostomy in left middle quadrant. Semiliquid stool which is Hemoccult negative. Stoma appears normal. Normoactive bowel sounds. Minimal tenderness in left lower quadrant. Rectal: Stool from colostomy was heme-negative. Extremities no pedal edema. Skin no lesions. Neurological alert and oriented x 3. Psychological normal mood and affect.  Assessment and Plan:  Problem #1 Crohn's colitis responding to a prednisone taper. She will continue to decrease her prednisone by 5 mg every 4 weeks. She will continue Humira and Asacol. We will refill her Valium and hydrocodone, bentyl and prednisone. I will see her in 2 months for follow up. Today's labs will include a CBC and sedimentation rate. We will also add Flagyl 250 mg twice a day for 2 weeks.   10/04/2011 [name]

## 2011-10-10 ENCOUNTER — Encounter: Payer: Self-pay | Admitting: *Deleted

## 2011-11-23 ENCOUNTER — Other Ambulatory Visit: Payer: Self-pay | Admitting: Internal Medicine

## 2011-12-02 ENCOUNTER — Encounter: Payer: Self-pay | Admitting: Internal Medicine

## 2011-12-02 ENCOUNTER — Ambulatory Visit (INDEPENDENT_AMBULATORY_CARE_PROVIDER_SITE_OTHER): Payer: Medicare Other | Admitting: Internal Medicine

## 2011-12-02 ENCOUNTER — Other Ambulatory Visit (INDEPENDENT_AMBULATORY_CARE_PROVIDER_SITE_OTHER): Payer: Medicare Other

## 2011-12-02 VITALS — BP 100/64 | HR 76 | Ht 59.0 in | Wt 103.0 lb

## 2011-12-02 DIAGNOSIS — K5090 Crohn's disease, unspecified, without complications: Secondary | ICD-10-CM

## 2011-12-02 DIAGNOSIS — Z79899 Other long term (current) drug therapy: Secondary | ICD-10-CM

## 2011-12-02 DIAGNOSIS — K5010 Crohn's disease of large intestine without complications: Secondary | ICD-10-CM

## 2011-12-02 DIAGNOSIS — D849 Immunodeficiency, unspecified: Secondary | ICD-10-CM

## 2011-12-02 LAB — CBC WITH DIFFERENTIAL/PLATELET
Basophils Absolute: 0 10*3/uL (ref 0.0–0.1)
Eosinophils Relative: 0 % (ref 0.0–5.0)
Lymphocytes Relative: 13.3 % (ref 12.0–46.0)
Lymphs Abs: 2 10*3/uL (ref 0.7–4.0)
Monocytes Relative: 1.5 % — ABNORMAL LOW (ref 3.0–12.0)
Neutrophils Relative %: 85 % — ABNORMAL HIGH (ref 43.0–77.0)
Platelets: 358 10*3/uL (ref 150.0–400.0)
RDW: 13.6 % (ref 11.5–14.6)
WBC: 15.3 10*3/uL — ABNORMAL HIGH (ref 4.5–10.5)

## 2011-12-02 MED ORDER — MESALAMINE 800 MG PO TBEC: 2.0000 | DELAYED_RELEASE_TABLET | Freq: Two times a day (BID) | ORAL | Status: DC

## 2011-12-02 NOTE — Progress Notes (Signed)
[name] 07-Dec-1955 MRN [id number]   History of Present Illness:  This is a 56 year old white female with Crohn's disease of the small bowel and rectum since 1986. She is status post left colon stricture resection in 1996 and diverting colostomy in August 2010 for severe fistulizing disease and rectal pain.. She has been on Humira 40 mg every 2 weeks and most recently every week. She has been on a prednisone taper 30 mg a day for a month and has been able to taper by 10 mg every month. She is currently at 10 mg a day. Her CT scan of the abdomen in January 2013 showed a thick colon consistent with colitis but this was not confirmed on her colonoscopy in March 2011 which show a normal colon. Biopsies of the cecum showed normal mucosa. The transverse colon showed mild colitis and granulomas. She has been overall improved since her last office visit in May 2013. She has less pain, less diarrhea and overall, is feeling better.   Past Medical History  Diagnosis Date  . Ulcerative colitis, unspecified   . Coronary atherosclerosis of unspecified type of vessel, native or graft   . Anal fistula   . Crohn's colitis    Past Surgical History  Procedure Date  . Cholecystectomy   . Colon resection   . Angioplasty   . Appendectomy     reports that she has been smoking Cigarettes.  She does not have any smokeless tobacco history on file. She reports that she does not drink alcohol or use illicit drugs. family history includes Heart disease in her brother and mother and Lung cancer in her paternal uncle.  There is no history of Colon polyps. Allergies  Allergen Reactions  . Remicade (Infliximab)     convulsions        Review of Systems: Denies dysphagia, odynophagia. Chest pain  The remainder of the 10 point ROS is negative except as outlined in H&P   Physical Exam: General appearance  Well developed, in no distress. Chronically ill-appearing Eyes- non icteric. HEENT nontraumatic,  normocephalic. Mouth no lesions, tongue papillated, no cheilosis. It didn't show loose Neck supple without adenopathy, thyroid not enlarged, no carotid bruits, no JVD. Lungs Clear to auscultation bilaterally. Cor normal S1, normal S2, regular rhythm, no murmur,  quiet precordium. Abdomen: Soft colostomy in left middle quadrant. No stool. Stoma appears healthy. Normal active bowel sounds. No tenderness. No distention. Rectal: Not done Extremities no pedal edema. Skin no lesions. Neurological alert and oriented x 3. Psychological normal mood and affect.  Assessment and Plan:  Problem #1 Crohn's colitis and rectal disease status post diverting colostomy. Patient is doing well on low-dose steroids and Humira. She will continue on the same medications and stay on prednisone 10 mg a day for another month before going down to 5 mg a day. We will check her blood count today and refill some of her medications. We will see her in December 2013.   12/02/2011 [name]

## 2011-12-02 NOTE — Patient Instructions (Addendum)
You have been given a separate informational sheet regarding your tobacco use, the importance of quitting and local resources to help you quit.  Your physician has requested that you go to the basement for the following lab work before leaving today: CBC  We have given you samples of the following medication to take:Asacol HD 800 mg take two tablets by mouth twice a day   Dr Juanda Chance has advised that you be on a prednisone 10 mg for one month then decrease to 5 mg there after    Please make follow up appointment with Dr. Juanda Chance in December

## 2012-01-18 ENCOUNTER — Telehealth: Payer: Self-pay | Admitting: Internal Medicine

## 2012-01-18 NOTE — Telephone Encounter (Signed)
Spoke with patient and scheduled her on 02/15/12 at 10:15 AM with Dr. Juanda Chance.

## 2012-01-19 ENCOUNTER — Telehealth: Payer: Self-pay | Admitting: Internal Medicine

## 2012-01-19 ENCOUNTER — Encounter: Payer: Self-pay | Admitting: *Deleted

## 2012-01-19 MED ORDER — MESALAMINE 800 MG PO TBEC: 2.0000 | DELAYED_RELEASE_TABLET | Freq: Two times a day (BID) | ORAL | Status: DC

## 2012-01-19 NOTE — Telephone Encounter (Signed)
Yes. We have done the letter in the past, don't remember when. OK to use the updated copy. DB

## 2012-01-19 NOTE — Telephone Encounter (Signed)
Patient also asked for samples of Asacol HD. Samples up front for pick up.

## 2012-01-19 NOTE — Telephone Encounter (Signed)
Patient is asking for a "letter" from Dr. Juanda Chance stating she is disabled and is under her care. She is trying to get a modification for her house payment and needs this. Please, advise if this is ok.

## 2012-01-19 NOTE — Telephone Encounter (Signed)
Letter created and awaiting Dr Regino Schultze signature.

## 2012-01-23 ENCOUNTER — Other Ambulatory Visit: Payer: Self-pay | Admitting: *Deleted

## 2012-01-23 MED ORDER — HYDROCODONE-ACETAMINOPHEN 5-500 MG PO TABS: 1.0000 | ORAL_TABLET | Freq: Two times a day (BID) | ORAL | Status: DC | PRN

## 2012-02-15 ENCOUNTER — Ambulatory Visit (INDEPENDENT_AMBULATORY_CARE_PROVIDER_SITE_OTHER): Payer: Medicare Other | Admitting: Internal Medicine

## 2012-02-15 ENCOUNTER — Encounter: Payer: Self-pay | Admitting: Internal Medicine

## 2012-02-15 ENCOUNTER — Other Ambulatory Visit (INDEPENDENT_AMBULATORY_CARE_PROVIDER_SITE_OTHER): Payer: Medicare Other

## 2012-02-15 VITALS — BP 118/60 | HR 76 | Ht 59.0 in | Wt 106.0 lb

## 2012-02-15 DIAGNOSIS — K5090 Crohn's disease, unspecified, without complications: Secondary | ICD-10-CM

## 2012-02-15 DIAGNOSIS — D849 Immunodeficiency, unspecified: Secondary | ICD-10-CM

## 2012-02-15 DIAGNOSIS — Z933 Colostomy status: Secondary | ICD-10-CM

## 2012-02-15 DIAGNOSIS — Z79899 Other long term (current) drug therapy: Secondary | ICD-10-CM

## 2012-02-15 LAB — CBC WITH DIFFERENTIAL/PLATELET
Eosinophils Relative: 0.8 % (ref 0.0–5.0)
HCT: 40.3 % (ref 36.0–46.0)
Lymphs Abs: 3.6 10*3/uL (ref 0.7–4.0)
Monocytes Relative: 6.3 % (ref 3.0–12.0)
Neutrophils Relative %: 69.8 % (ref 43.0–77.0)
Platelets: 378 10*3/uL (ref 150.0–400.0)
WBC: 15.9 10*3/uL — ABNORMAL HIGH (ref 4.5–10.5)

## 2012-02-15 LAB — SEDIMENTATION RATE: Sed Rate: 20 mm/hr (ref 0–22)

## 2012-02-15 MED ORDER — MESALAMINE 800 MG PO TBEC: 2.0000 | DELAYED_RELEASE_TABLET | Freq: Two times a day (BID) | ORAL | Status: DC

## 2012-02-15 MED ORDER — METRONIDAZOLE 250 MG PO TABS: 250.0000 mg | ORAL_TABLET | Freq: Three times a day (TID) | ORAL | Status: DC

## 2012-02-15 NOTE — Patient Instructions (Addendum)
Your physician has requested that you go to the basement for the following lab work before leaving today: CBC, Sed Rate  We have sent the following medications to your pharmacy for you to pick up at your convenience: Flagyl  We have given you samples of Asacol HD to take 2 tablets daily.  Please follow up with Dr Juanda Chance in 3 months.

## 2012-02-15 NOTE — Progress Notes (Signed)
[name] 1955-04-11 MRN [id number]   History of Present Illness:  This is a 56 year old white female with Crohn's disease of the colon with involvement of the small bowel and rectum since 1986. She is post left colon resection in 1996 and again in August 2010 resulting in a diverting colostomy for fistulizing disease. She has been under reasonable control with Humira 40 mg weekly, Prednisone taper, currently at 5 mg a day and mesalamine 1.6 g daily. She has occasional crampy abdominal pain. Her colostomy has been functioning. She has periods of diarrhea. Her weight has increased 3 pounds since her last visit 3 months ago. Her sedimentation rate has decreased from 39 to 25. Her last colonoscopy in March 2013 showed transverse colon colitis with granulomas on random biopsies. A CT scan of the abdomen showed a thickened colon proximal to the colostomy consistent with low-grade colitis.   Past Medical History  Diagnosis Date  . Ulcerative colitis, unspecified   . Coronary atherosclerosis of unspecified type of vessel, native or graft   . Anal fistula   . Crohn's colitis    Past Surgical History  Procedure Date  . Cholecystectomy   . Colon resection   . Angioplasty   . Appendectomy     reports that she has been smoking Cigarettes.  She has never used smokeless tobacco. She reports that she does not drink alcohol or use illicit drugs. family history includes Heart disease in her brother and mother and Lung cancer in her paternal uncle.  There is no history of Colon polyps. Allergies  Allergen Reactions  . Remicade (Infliximab)     convulsions        Review of Systems: Denies fever bleeding nausea vomiting  The remainder of the 10 point ROS is negative except as outlined in H&P   Physical Exam: General appearance  Well developed, in no distress. Eyes- non icteric. HEENT nontraumatic, normocephalic. Mouth no lesions, tongue papillated, no cheilosis. Neck supple without  adenopathy, thyroid not enlarged, no carotid bruits, no JVD. Lungs Clear to auscultation bilaterally. Cor normal S1, normal S2, regular rhythm, no murmur,  quiet precordium. Abdomen: Soft with colostomy in the left middle quadrant. Stoma appears healthy. There is no tenderness. No distention. Bowel sounds are normal. Rectal: External perianal disease with large pseudopolyps at the anal orifice. Rectum is closed. There is no fistula or drainage. Extremities no pedal edema. Skin no lesions. Neurological alert and oriented x 3. Psychological normal mood and affect.  Assessment and Plan:  Problem #1 Crohn's colitis. Patient is status post diverting colostomy. She still has some active disease in her colon but is on maximum medical therapy and is functioning reasonably well. We will have her to continue all medications with the exception of prednisone which would be decreased to 5 mg every other day. We will also give her a four-week course of Flagyl 250 mg twice a day. She will return in 3 months. Samples of mesalamine have been given. Today, we are checking a CBC and sedimentation rate.   02/15/2012 [name]

## 2012-02-22 ENCOUNTER — Other Ambulatory Visit: Payer: Self-pay | Admitting: Internal Medicine

## 2012-02-22 MED ORDER — HYDROCODONE-ACETAMINOPHEN 5-500 MG PO TABS: 1.0000 | ORAL_TABLET | Freq: Two times a day (BID) | ORAL | Status: DC | PRN

## 2012-02-22 NOTE — Telephone Encounter (Signed)
rx sent

## 2012-03-12 ENCOUNTER — Other Ambulatory Visit: Payer: Self-pay | Admitting: *Deleted

## 2012-03-12 MED ORDER — DIAZEPAM 5 MG PO TABS: 5.0000 mg | ORAL_TABLET | Freq: Every day | ORAL | Status: DC | PRN

## 2012-04-20 ENCOUNTER — Other Ambulatory Visit: Payer: Self-pay | Admitting: Internal Medicine

## 2012-05-15 ENCOUNTER — Other Ambulatory Visit (INDEPENDENT_AMBULATORY_CARE_PROVIDER_SITE_OTHER): Payer: Medicare Other

## 2012-05-15 ENCOUNTER — Encounter: Payer: Self-pay | Admitting: Internal Medicine

## 2012-05-15 ENCOUNTER — Ambulatory Visit (INDEPENDENT_AMBULATORY_CARE_PROVIDER_SITE_OTHER): Payer: Medicare Other | Admitting: Internal Medicine

## 2012-05-15 VITALS — BP 118/60 | HR 72 | Ht 59.0 in | Wt 103.0 lb

## 2012-05-15 DIAGNOSIS — D849 Immunodeficiency, unspecified: Secondary | ICD-10-CM

## 2012-05-15 DIAGNOSIS — K5090 Crohn's disease, unspecified, without complications: Secondary | ICD-10-CM

## 2012-05-15 DIAGNOSIS — Z933 Colostomy status: Secondary | ICD-10-CM

## 2012-05-15 LAB — CBC WITH DIFFERENTIAL/PLATELET
Basophils Absolute: 0 10*3/uL (ref 0.0–0.1)
Basophils Relative: 0.4 % (ref 0.0–3.0)
Eosinophils Absolute: 0 10*3/uL (ref 0.0–0.7)
Lymphocytes Relative: 18.4 % (ref 12.0–46.0)
MCHC: 33.6 g/dL (ref 30.0–36.0)
MCV: 86.8 fl (ref 78.0–100.0)
Monocytes Absolute: 0.3 10*3/uL (ref 0.1–1.0)
Neutro Abs: 8.8 10*3/uL — ABNORMAL HIGH (ref 1.4–7.7)
Neutrophils Relative %: 78.7 % — ABNORMAL HIGH (ref 43.0–77.0)
RBC: 4.59 Mil/uL (ref 3.87–5.11)
RDW: 13.5 % (ref 11.5–14.6)

## 2012-05-15 LAB — COMPREHENSIVE METABOLIC PANEL
ALT: 12 U/L (ref 0–35)
AST: 19 U/L (ref 0–37)
Alkaline Phosphatase: 67 U/L (ref 39–117)
BUN: 7 mg/dL (ref 6–23)
Chloride: 102 mEq/L (ref 96–112)
Creatinine, Ser: 0.8 mg/dL (ref 0.4–1.2)
Potassium: 3.9 mEq/L (ref 3.5–5.1)

## 2012-05-15 MED ORDER — MESALAMINE 1.2 G PO TBEC: 4.8000 g | DELAYED_RELEASE_TABLET | Freq: Every day | ORAL | Status: DC

## 2012-05-15 NOTE — Progress Notes (Signed)
[name] [birth year] MRN [id number]   History of Present Illness:  This is a 57 year old white female with Crohn's disease since 1986 involving the small bowel, colon and rectum. She has a history of a left colon stricture resected in 1996 and diverting colostomy in 2010 for fistulizing rectal disease. She has been on Humira 40 mg weekly, mesalamine 1.6 g a day and prednisone 5 mg a day. She has completed Flagyl. Her last colonoscopy in March 2013 via colostomy showed colitis with granulomas. A CT scan of the abdomen in March 2013 showed thickening of the colon proximal to the colostomy consistent with colitis. She has no specific complaints today. The colostomy output has been soft or loose. She denies any bleeding per colostomy. Her level of energy has been satisfactory. She is tolerating a regular diet. 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 She is due for a TB skin test today.    Past Medical History  Diagnosis Date  . Ulcerative colitis, unspecified   . Coronary atherosclerosis of unspecified type of vessel, native or graft   . Anal fistula   . Crohn's colitis    Past Surgical History  Procedure Laterality Date  . Cholecystectomy    . Colon resection    . Angioplasty    . Appendectomy      reports that she has been smoking Cigarettes.  She has been smoking about 0.00 packs per day. She has never used smokeless tobacco. She reports that she does not drink alcohol or use illicit drugs. family history includes Heart disease in her brother and mother and Lung cancer in her paternal uncle.  There is no history of Colon polyps. Allergies  Allergen Reactions  . Remicade (Infliximab)     convulsions        Review of Systems: Denies heartburn. Weight loss from 106 203 pounds  The remainder of the 10 point ROS is negative except as outlined in H&P   Physical Exam: General appearance  Well developed, in no distress. Chronically  ill-appearing Eyes- non icteric. HEENT nontraumatic, normocephalic. Mouth no lesions, tongue papillated, no cheilosis. Neck supple without adenopathy, thyroid not enlarged, no carotid bruits, no JVD. Lungs Clear to auscultation bilaterally. Cor normal S1, normal S2, regular rhythm, no murmur,  quiet precordium. Abdomen: Soft nontender with normoactive bowel sounds. Colostomy in the periumbilical area. No stool in the appliance. Stoma appears healthy. Rectal: Perianal area without fistulizing disease. Large external skin tag at the anal orifice. Spastic rectal sphincter. No stool. Extremities no pedal edema. Skin no lesions. Neurological alert and oriented x 3. Psychological normal mood and affect.  Assessment and Plan:  Problem #1 Crohn's disease of the small bowel colon and rectum. Patient is status post diverting colostomy 3 years ago. She is doing very well on multiple medications to keep her Crohn's disease in remission. We will recheck her labs today. She will receive a TB skin test and continue on her Humira. We will switch her to Lialda 4.8 g daily.   05/15/2012 [name]

## 2012-05-15 NOTE — Patient Instructions (Addendum)
We have sent the following medications to your pharmacy for you to pick up at your convenience: Lialda  Your physician has requested that you go to the basement for the following lab work before leaving today: CBC, CMET, Sed Rate  Please follow up with Dr Juanda Chance in 6 months.  We have given you a TB skin test today. Please make certain to come back to the office for a reading between 48-72 hours from now to avoid requiring repeat testing.

## 2012-05-18 LAB — TB SKIN TEST: TB Skin Test: NEGATIVE

## 2012-05-24 ENCOUNTER — Telehealth: Payer: Self-pay | Admitting: Internal Medicine

## 2012-05-24 MED ORDER — HYDROCODONE-ACETAMINOPHEN 5-325 MG PO TABS: 1.0000 | ORAL_TABLET | Freq: Two times a day (BID) | ORAL | Status: DC

## 2012-05-24 NOTE — Telephone Encounter (Signed)
rx sent to the pharmacy. 

## 2012-07-31 ENCOUNTER — Other Ambulatory Visit: Payer: Self-pay | Admitting: Internal Medicine

## 2012-07-31 NOTE — Telephone Encounter (Signed)
Sent message to Dr. Juanda Chance, asking if refill for Norco is ok. Waiting for response.

## 2012-08-01 NOTE — Telephone Encounter (Signed)
Ok to refill Norco per Dr. Juanda Chance # [phone].

## 2012-08-28 ENCOUNTER — Other Ambulatory Visit: Payer: Self-pay | Admitting: Internal Medicine

## 2012-08-30 ENCOUNTER — Telehealth: Payer: Self-pay | Admitting: Internal Medicine

## 2012-08-30 NOTE — Telephone Encounter (Signed)
Patient advised that we are currently out of Lialda samples. However, she does have a script for the Lialda. Patient verbalizes understanding.

## 2012-10-30 ENCOUNTER — Telehealth: Payer: Self-pay | Admitting: Internal Medicine

## 2012-10-30 NOTE — Telephone Encounter (Signed)
Line busy - will try again later

## 2012-10-30 NOTE — Telephone Encounter (Signed)
Spoke with patient and she states she has not taken her Humira for 1 1/2 months because she has a balance due to [facility] and they will not ship her anymore until she pays the balance. She wanted to let Dr. Juanda Chance know this.

## 2012-10-30 NOTE — Telephone Encounter (Signed)
That is very unfortunate. Do we have any samples, or can we call the drug rep to give [place] some? Thanx

## 2012-10-31 NOTE — Telephone Encounter (Signed)
Spoke with patient and she will call us after the nurse comes to visit her today. She wants [place] to call her when her handicap forms are ready. Dottie aware.

## 2012-10-31 NOTE — Telephone Encounter (Signed)
Line busy - will try again later

## 2012-11-02 NOTE — Telephone Encounter (Signed)
Spoke with patient and the nurse came out to see her from the [place] program with [employer]. Patient has called  [facility] to see if she can work out a Insurance claims handler so she can get restarted on her Humira. She is to hear from them within 48 hours.

## 2012-11-02 NOTE — Telephone Encounter (Signed)
Patient calling to see if her forms are ready. She states the nurse is coming to see her today.

## 2012-11-06 ENCOUNTER — Ambulatory Visit (INDEPENDENT_AMBULATORY_CARE_PROVIDER_SITE_OTHER): Payer: Medicare Other | Admitting: Internal Medicine

## 2012-11-06 ENCOUNTER — Other Ambulatory Visit (INDEPENDENT_AMBULATORY_CARE_PROVIDER_SITE_OTHER): Payer: Medicare Other

## 2012-11-06 ENCOUNTER — Encounter: Payer: Self-pay | Admitting: Internal Medicine

## 2012-11-06 VITALS — BP 90/60 | HR 88 | Ht 59.5 in | Wt 92.4 lb

## 2012-11-06 DIAGNOSIS — M818 Other osteoporosis without current pathological fracture: Secondary | ICD-10-CM

## 2012-11-06 DIAGNOSIS — Z23 Encounter for immunization: Secondary | ICD-10-CM

## 2012-11-06 DIAGNOSIS — K5090 Crohn's disease, unspecified, without complications: Secondary | ICD-10-CM

## 2012-11-06 LAB — CBC WITH DIFFERENTIAL/PLATELET
Eosinophils Relative: 1.4 % (ref 0.0–5.0)
MCV: 87.6 fl (ref 78.0–100.0)
Monocytes Absolute: 0.1 10*3/uL (ref 0.1–1.0)
Monocytes Relative: 1.4 % — ABNORMAL LOW (ref 3.0–12.0)
Neutrophils Relative %: 66.6 % (ref 43.0–77.0)
Platelets: 444 10*3/uL — ABNORMAL HIGH (ref 150.0–400.0)
WBC: 9.6 10*3/uL (ref 4.5–10.5)

## 2012-11-06 MED ORDER — PREDNISONE 10 MG PO TABS: 10.0000 mg | ORAL_TABLET | Freq: Every day | ORAL | Status: DC

## 2012-11-06 MED ORDER — PNEUMOCOCCAL VAC POLYVALENT 25 MCG/0.5ML IJ INJ: 0.5000 mL | INJECTION | Freq: Once | INTRAMUSCULAR | Status: DC

## 2012-11-06 MED ORDER — MESALAMINE 1.2 G PO TBEC: 4.8000 g | DELAYED_RELEASE_TABLET | Freq: Every day | ORAL | Status: DC

## 2012-11-06 MED ORDER — ADALIMUMAB 40 MG/0.8ML ~~LOC~~ KIT: 40.0000 mg | PACK | SUBCUTANEOUS | Status: DC

## 2012-11-06 NOTE — Patient Instructions (Addendum)
Your physician has requested that you go to the basement for the following lab work before leaving today: Hepatitis A, Hepatitis B, CBC  Your physician has requested that you go to the basement for the following lab work before leaving today: Prednisone  We have given you samples of the following medication to take: Humira  We have given you a pneumonia (pneumovax) vaccine today. You may experience a small amount of swelling and redness at the injection site. This is normal. Should you experience these symptoms, please apply ice to the injection area for 10-15 minutes every 2-3 hours. However, should these symptoms or any other symptoms related to the injection concern you, please call our office at [phone].  You have been scheduled for a bone density test on Friday, 11/09/12 at 10:00 am. Please go to radiology on the basement floor of [facility] for this test. Make sure not to wear any metal no your pants. No preparation is necessary.

## 2012-11-06 NOTE — Progress Notes (Signed)
[name] March 21, 1955 MRN [id number]   History of Present Illness:  This is a 57 year old white female with Crohns disease of the colon, rectum and the small bowel since 1986, left colon stricture resected in 1996. Diverting colostomy created in 2010 for fistulizing  anal disease. She was on Humira 40 mg IM weekly until several weeks ago when she ran out and could not afford  co-pay. She is on mesalamine 4.8 g daily but is taking only 2.4 g daily. She  taper her prednisone to 2.5 mg daily. She has completed her Flagyl. A colonoscopy in March 2011 via colostomy showed colitis in transverse colon. Biopsies demonstrated granulomas. She is allergic to Remicade. A CT scan of the abdomen in March 2013 showed a thickened colon consistent with colitis. She is due to have hepatitis A, B and serologies today. She is also due for DEXA scan and pneumococcal vaccine. She has been under a lot of stress because her daughter was diagnosed with PTSD andshe is having financial problems.   Past Medical History  Diagnosis Date  . Ulcerative colitis, unspecified   . Coronary atherosclerosis of unspecified type of vessel, native or graft   . Anal fistula   . Crohn's colitis    Past Surgical History  Procedure Laterality Date  . Cholecystectomy    . Colon resection    . Angioplasty    . Appendectomy      reports that she has been smoking Cigarettes.  She has been smoking about 0.00 packs per day. She has never used smokeless tobacco. She reports that she does not drink alcohol or use illicit drugs. family history includes Heart disease in her brother and mother; Lung cancer in her paternal uncle. There is no history of Colon polyps. Allergies  Allergen Reactions  . Remicade [Infliximab]     convulsions        Review of Systems: Denies nausea vomiting fever or rectal bleeding  The remainder of the 10 point ROS is negative except as outlined in H&P   Physical Exam: General appearance thin  chronically ill-appearing, in no distress. Eyes- non icteric. HEENT nontraumatic, normocephalic. Mouth no lesions, tongue papillated,  Cheilosis present Neck supple without adenopathy, thyroid not enlarged, no carotid bruits, no JVD. Lungs Clear to auscultation bilaterally. Cor normal S1, normal S2, regular rhythm, no murmur,  quiet precordium. Abdomen: Scaphoid soft with the colostomy appliance in the left middle quadrant. Very tender in left lower quadrant no rebound.stool from colostomy is heme negative. Rectal: Status post the rectal closure Extremities no pedal edema. Skin no lesions. Neurological alert and oriented x 3. Psychological normal mood and affect.  Assessment and Plan:  Problem #66 57 year old white female who had an interrupted Humira treatment for Crohn's colitis due to financial problems. We have found a sample in our office which she will use today. We have given her samples of mesalamine to take 2.4 g daily and samples of iron supplements. We will schedule her for a DEXA scan and will also check her hepatitis A and B serologies. She will receive a pneumovax vaccine today. She is supposed to hear from Humira assistance program about resuming her Humira.   11/06/2012 [name]

## 2012-11-07 ENCOUNTER — Telehealth: Payer: Self-pay | Admitting: Internal Medicine

## 2012-11-07 LAB — HEPATITIS B SURFACE ANTIGEN: Hepatitis B Surface Ag: NEGATIVE

## 2012-11-07 LAB — HEPATITIS A ANTIBODY, TOTAL: Hep A Total Ab: NEGATIVE

## 2012-11-07 LAB — HEPATITIS B SURFACE ANTIBODY,QUALITATIVE: Hep B S Ab: NEGATIVE

## 2012-11-07 NOTE — Telephone Encounter (Signed)
Noted  

## 2012-11-08 NOTE — Telephone Encounter (Signed)
Unable to reach patient at cell number. 

## 2012-11-09 ENCOUNTER — Telehealth: Payer: Self-pay | Admitting: *Deleted

## 2012-11-09 ENCOUNTER — Ambulatory Visit (INDEPENDENT_AMBULATORY_CARE_PROVIDER_SITE_OTHER)
Admission: RE | Admit: 2012-11-09 | Discharge: 2012-11-09 | Disposition: A | Discharge status: Home / Self Care | Payer: Medicare Other | Source: Ambulatory Visit | Attending: Internal Medicine | Admitting: Internal Medicine

## 2012-11-09 DIAGNOSIS — K5090 Crohn's disease, unspecified, without complications: Secondary | ICD-10-CM

## 2012-11-09 DIAGNOSIS — M818 Other osteoporosis without current pathological fracture: Secondary | ICD-10-CM

## 2012-11-09 NOTE — Telephone Encounter (Signed)
Patient left a message that she needs a new Rx sent to [facility]. Telephone number [phone].

## 2012-11-09 NOTE — Telephone Encounter (Signed)
Unable to reach at cell or home.

## 2012-11-09 NOTE — Telephone Encounter (Signed)
Unable to reach patient at cell number. 

## 2012-11-09 NOTE — Telephone Encounter (Signed)
Patient talked with [name] and she will be getting the Humira shipped. She will call when this is done.

## 2012-11-12 MED ORDER — ADALIMUMAB 40 MG/0.8ML ~~LOC~~ KIT: 40.0000 mg | PACK | SUBCUTANEOUS | Status: DC

## 2012-11-12 NOTE — Telephone Encounter (Signed)
Called the number provided by patient and fax rx to [phone].

## 2012-11-14 ENCOUNTER — Telehealth: Payer: Self-pay | Admitting: Internal Medicine

## 2012-11-14 NOTE — Telephone Encounter (Signed)
Patient will get her Humira shipment on Friday.

## 2012-11-19 ENCOUNTER — Other Ambulatory Visit: Payer: Self-pay | Admitting: Internal Medicine

## 2012-11-20 ENCOUNTER — Other Ambulatory Visit: Payer: Self-pay | Admitting: *Deleted

## 2012-11-20 MED ORDER — ADALIMUMAB 40 MG/0.8ML ~~LOC~~ KIT: 40.0000 mg | PACK | SUBCUTANEOUS | Status: DC

## 2012-11-27 ENCOUNTER — Other Ambulatory Visit: Payer: Self-pay | Admitting: Internal Medicine

## 2012-11-27 NOTE — Telephone Encounter (Signed)
Spoke to Burley at [facility]. Rx was already faxed on 11/19/12. They did not receive it. Therefore, I gave verbal consent for the rx.

## 2012-12-07 ENCOUNTER — Other Ambulatory Visit: Payer: Self-pay | Admitting: *Deleted

## 2012-12-07 MED ORDER — ALENDRONATE SODIUM 70 MG PO TABS: ORAL_TABLET | ORAL | Status: DC

## 2012-12-07 MED ORDER — CALCIUM-VITAMIN D 600-125 MG-UNIT PO TABS: ORAL_TABLET | ORAL | Status: DC

## 2012-12-21 ENCOUNTER — Telehealth: Payer: Self-pay | Admitting: Internal Medicine

## 2012-12-21 NOTE — Telephone Encounter (Signed)
I have spoken to patient and have gone over the information written below. She verbalizes understanding. Samples of Lialda have been given to patient. #24 tablets provided. Lot [id number] exp 4/15

## 2012-12-21 NOTE — Telephone Encounter (Signed)
Left message for patient to call back. We can provide her with a limited number of samples, however, she should apply for patient assistance if she needs more than a few Lialda as we cannot always guarantee that we will have samples available in the office. I have printed forms for patient to fill out for patient assistance.

## 2013-01-04 ENCOUNTER — Telehealth: Payer: Self-pay | Admitting: Internal Medicine

## 2013-01-04 MED ORDER — HYDROCODONE-ACETAMINOPHEN 5-325 MG PO TABS: 1.0000 | ORAL_TABLET | Freq: Two times a day (BID) | ORAL | Status: DC | PRN

## 2013-01-04 NOTE — Telephone Encounter (Signed)
rx created and awaiting Dr Brodie's signature. 

## 2013-01-15 ENCOUNTER — Ambulatory Visit (INDEPENDENT_AMBULATORY_CARE_PROVIDER_SITE_OTHER): Payer: Medicare Other | Admitting: Internal Medicine

## 2013-01-15 ENCOUNTER — Encounter: Payer: Self-pay | Admitting: Internal Medicine

## 2013-01-15 VITALS — BP 92/62 | HR 88 | Ht 58.66 in | Wt 92.2 lb

## 2013-01-15 DIAGNOSIS — D849 Immunodeficiency, unspecified: Secondary | ICD-10-CM

## 2013-01-15 DIAGNOSIS — Z933 Colostomy status: Secondary | ICD-10-CM

## 2013-01-15 DIAGNOSIS — K5090 Crohn's disease, unspecified, without complications: Secondary | ICD-10-CM

## 2013-01-15 NOTE — Progress Notes (Signed)
[name] 1955/06/13 MRN [id number]  History of Present Illness:  This is a 57 year old white female with complicated Crohn's disease since 5. She is status post resection of the terminal ileum and colonic stricture. Her last surgery in 2010 was a diverting colostomy. She is currently on Humira 40 mg weekly, mesalamine 4.8 g daily and prednisone 10 mg daily. She ran out of prednisone several days ago and does not have money to buy it. It would cost her 10 dollars to buy prednisone. She has periodic diarrhea. Her weight has remained stable at 92 pounds. Her last CT scan of the abdomen showed colitis in the transverse colon as well as in the ascending colon.    Past Medical History  Diagnosis Date  . Ulcerative colitis, unspecified   . Coronary atherosclerosis of unspecified type of vessel, native or graft   . Anal fistula   . Crohn's colitis    Past Surgical History  Procedure Laterality Date  . Cholecystectomy    . Colon resection    . Angioplasty    . Appendectomy      reports that she has been smoking Cigarettes.  She has been smoking about 0.00 packs per day. She has never used smokeless tobacco. She reports that she does not drink alcohol or use illicit drugs. family history includes Heart disease in her brother and mother; Lung cancer in her paternal uncle. There is no history of Colon polyps. Allergies  Allergen Reactions  . Remicade [Infliximab]     convulsions        Review of Systems: Abdominal pain. Diarrhea per colostomy. Weight stable  The remainder of the 10 point ROS is negative except as outlined in H&P   Physical Exam: General appearance thin in no distress. Chronically ill-appearing Eyes- non icteric. HEENT nontraumatic, normocephalic. Mouth no lesions, tongue papillated, no cheilosis. Neck supple without adenopathy, thyroid not enlarged, no carotid bruits, no JVD. Lungs Clear to auscultation bilaterally. Cor normal S1, normal S2, regular rhythm,  no murmur,  quiet precordium. Abdomen: Tender around the colostomy. Normoactive bowel sounds. No distention. Rectal:s/p diverting colostomy Extremities no pedal edema. Skin no lesions. Neurological alert and oriented x 3. Psychological normal mood and affect.  Assessment and Plan:  Problem #64 57 year old white female with severe Crohn's disease. She is currently maintaining her weight. I had arranged for her to obtain prednisone right away and continue on 10 mg daily. I will see her in 3 months. All other medications will remain the same., specifically Humira, Mesalamine and B12,   01/15/2013 [name]

## 2013-01-15 NOTE — Patient Instructions (Signed)
You have been given a separate informational sheet regarding your tobacco use, the importance of quitting and local resources to help you quit.  

## 2013-02-04 ENCOUNTER — Telehealth: Payer: Self-pay | Admitting: Internal Medicine

## 2013-02-04 MED ORDER — HYDROCODONE-ACETAMINOPHEN 5-325 MG PO TABS: 1.0000 | ORAL_TABLET | Freq: Two times a day (BID) | ORAL | Status: DC | PRN

## 2013-02-04 NOTE — Telephone Encounter (Signed)
rx created. It will be signed by Doug Sou, PA-C in Dr Regino Schultze absence.

## 2013-05-09 ENCOUNTER — Telehealth: Payer: Self-pay | Admitting: Internal Medicine

## 2013-05-09 MED ORDER — ADALIMUMAB 40 MG/0.8ML ~~LOC~~ KIT: 40.0000 mg | PACK | SUBCUTANEOUS | Status: DC

## 2013-05-09 NOTE — Telephone Encounter (Signed)
Rx sent 

## 2013-05-10 ENCOUNTER — Telehealth: Payer: Self-pay | Admitting: *Deleted

## 2013-05-10 MED ORDER — ADALIMUMAB 40 MG/0.8ML ~~LOC~~ KIT: 40.0000 mg | PACK | SUBCUTANEOUS | Status: DC

## 2013-05-10 NOTE — Telephone Encounter (Signed)
Patient's new insurance ([employer] [phone]) no longer allows [facility] to be used as a Psychologist, prison and probation services for patient. Instead, they use [facility] (as per conversation # [id number]). I will send new script to [place] for patient's Humira.

## 2013-05-15 ENCOUNTER — Telehealth: Payer: Self-pay | Admitting: Internal Medicine

## 2013-05-15 NOTE — Telephone Encounter (Signed)
I have printed off patient assistance forms which are awaiting completion by Dr Olevia Perches. I will let patient know when this is ready so she can fill out her portion of the form before sending it in. Patient verbalizes understanding.

## 2013-05-17 ENCOUNTER — Ambulatory Visit (INDEPENDENT_AMBULATORY_CARE_PROVIDER_SITE_OTHER): Payer: Commercial Managed Care - HMO | Admitting: Family

## 2013-05-17 ENCOUNTER — Telehealth: Payer: Self-pay | Admitting: *Deleted

## 2013-05-17 ENCOUNTER — Encounter: Payer: Self-pay | Admitting: Family

## 2013-05-17 VITALS — BP 100/68 | HR 71 | Temp 97.9°F | Resp 16 | Ht 60.0 in | Wt 97.0 lb

## 2013-05-17 DIAGNOSIS — K5010 Crohn's disease of large intestine without complications: Secondary | ICD-10-CM

## 2013-05-17 DIAGNOSIS — I2510 Atherosclerotic heart disease of native coronary artery without angina pectoris: Secondary | ICD-10-CM

## 2013-05-17 DIAGNOSIS — I25810 Atherosclerosis of coronary artery bypass graft(s) without angina pectoris: Secondary | ICD-10-CM | POA: Insufficient documentation

## 2013-05-17 DIAGNOSIS — M810 Age-related osteoporosis without current pathological fracture: Secondary | ICD-10-CM | POA: Insufficient documentation

## 2013-05-17 NOTE — Assessment & Plan Note (Signed)
On fosamax and caltrate. Continue same.

## 2013-05-17 NOTE — Assessment & Plan Note (Signed)
Asymptomatic. Refer to cardiology for ongoing management. She is on ASA 81mg .

## 2013-05-17 NOTE — Assessment & Plan Note (Signed)
Hx of CAD, needs cardiologist.  Asymptomatic.

## 2013-05-17 NOTE — Assessment & Plan Note (Signed)
S/p colostomy. Follows with GI, management per GI.

## 2013-05-17 NOTE — Telephone Encounter (Signed)
Received message from pt wanting to know if GI referral to Dr Olevia Perches was placed as she states she has appt with her on 05/21/13 and pt thinks insurance now requires referrals from her PCP.  Please advise.

## 2013-05-17 NOTE — Patient Instructions (Signed)
Please schedule a fasting wellness visit in 1 month. Welcome to [facility]!

## 2013-05-17 NOTE — Progress Notes (Signed)
Subjective:    Patient ID: [name], female    DOB: [birth year], 58 y.o.   MRN: [id number]  HPI  [name] is a 58 yr old female who presents today to establish care.   Heart Disease- CAD- reports that she had an MI in her early 26's with angioplasty.  She has not seen a cardiologist in years.   Heart Murmur- as a child.    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     Chron's Colitis- follows with Dr. [name], reports + colostomy x 3 years. Reports she had cellulitis , coded, was in coma x 6 weeks.  Was recommended that she have colostomy.  She reports continued diarrhea, severe diarrhea.    Osteoporosis- recently started fosamax and calcium.    Review of Systems  Constitutional: Negative for unexpected weight change.  HENT: Negative for hearing loss and postnasal drip.   Eyes:       Uses readers  Respiratory: Negative for cough and shortness of breath.   Cardiovascular: Negative for leg swelling.  Gastrointestinal: Positive for abdominal pain and diarrhea.  Genitourinary: Negative for dysuria, frequency and hematuria.  Musculoskeletal: Positive for arthralgias.  Skin: Negative for rash.  Neurological: Negative for headaches.  Hematological: Negative for adenopathy.  Psychiatric/Behavioral:       Denies anxiety/depression   Past Medical History  Diagnosis Date  . Ulcerative colitis, unspecified   . Coronary atherosclerosis of unspecified type of vessel, native or graft   . Anal fistula   . Arthritis   . Personal history of colonic polyps   . Crohn's colitis   . History of colostomy   . Heart murmur     History   Social History  . Marital Status: Divorced    Spouse Name: N/A    Number of Children: 1  . Years of Education: N/A   Occupational History  . disabled    Social History Main Topics  . Smoking status: Current Some Day Smoker    Types: Cigarettes  . Smokeless tobacco: Never Used     Comment: 3 cigarettes daily    . Alcohol Use: No  . Drug Use: No  . Sexual Activity: Not on file   Other Topics Concern  . 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 Cholecystectomy    . Colon resection      1988 she thinks  . Angioplasty      1987  . Appendectomy    . Tubal ligation    . Colostomy  2012    Family History  Problem Relation Age of Onset  . Heart disease Brother   . Heart disease Mother   . Lung cancer Paternal Uncle   . Colon polyps Neg Hx   . Stroke Father     Allergies  Allergen Reactions  . Remicade [Infliximab]     convulsions    Current Outpatient Prescriptions on File Prior to Visit  Medication Sig Dispense Refill  . adalimumab (HUMIRA) 40 MG/0.8ML injection Inject 0.8 mLs (40 mg total) into the skin once a week.  12 each  0  . alendronate (FOSAMAX) 70 MG tablet Take with a full glass of water on an empty  stomach. No food for 30 minutes after taking  4 tablet  11  . aspirin 81 MG tablet Take 81 mg by mouth daily.        . Calcium Carbonate-Vitamin D (CALCIUM-VITAMIN D) 600-125 MG-UNIT TABS Take 1200mg  with vitamin D daily.  60 each  0  . diazepam (VALIUM) 5 MG tablet Take 1 tablet (5 mg total) by mouth daily as needed.  30 tablet  1  . dicyclomine (BENTYL) 20 MG tablet Take 1 tablet by mouth twice daily as needed for crampy abdominal pain  60 tablet  2  . HYDROcodone-acetaminophen (NORCO/VICODIN) 5-325 MG per tablet Take 1 tablet by mouth 2 (two) times daily as needed.  60 tablet  0  . mesalamine (LIALDA) 1.2 G EC tablet Take 4 tablets (4.8 g total) by mouth daily with breakfast.  120 tablet  3  . Multiple Vitamin (MULTIVITAMIN PO) Take by mouth. Take 1 tablet by mouth once daily       . predniSONE (DELTASONE) 10 MG tablet Take 1 tablet (10 mg  total) by mouth daily. Take as directed  100 tablet  0   Current Facility-Administered Medications on File Prior to Visit  Medication Dose Route Frequency Provider Last Rate Last Dose  . pneumococcal 23 valent vaccine (PNU-IMMUNE) injection 0.5 mL  0.5 mL Intramuscular Once [place] Dragon, MD        BP 100/68  Pulse 71  Temp(Src) 97.9 F (36.6 C) (Oral)  Resp 16  Ht 5' (1.524 m)  Wt 97 lb 0.6 oz (44.017 kg)  BMI 18.95 kg/m2  SpO2 99%       Objective:   Physical Exam  Constitutional: She appears well-developed and well-nourished. No distress.  Appears older than stated age  HENT:  Head: Normocephalic and atraumatic.  Right Ear: Tympanic membrane and ear canal normal.  Left Ear: Tympanic membrane and ear canal normal.  Mouth/Throat: No oropharyngeal exudate, posterior oropharyngeal edema or posterior oropharyngeal erythema.  Cardiovascular: Normal rate and regular rhythm.   No murmur heard. Pulmonary/Chest: Effort normal and breath sounds normal. No respiratory distress. She has no wheezes. She has no rales. She exhibits no tenderness.          Assessment & Plan:

## 2013-05-17 NOTE — Progress Notes (Signed)
Pre visit review using our clinic review tool, if applicable. No additional management support is needed unless otherwise documented below in the visit note. 

## 2013-05-20 ENCOUNTER — Telehealth: Payer: Self-pay | Admitting: Family

## 2013-05-20 NOTE — Telephone Encounter (Signed)
Relevant patient education mailed to patient.  

## 2013-05-21 ENCOUNTER — Other Ambulatory Visit (INDEPENDENT_AMBULATORY_CARE_PROVIDER_SITE_OTHER): Payer: Commercial Managed Care - HMO

## 2013-05-21 ENCOUNTER — Other Ambulatory Visit: Payer: Self-pay | Admitting: Internal Medicine

## 2013-05-21 ENCOUNTER — Encounter: Payer: Self-pay | Admitting: Internal Medicine

## 2013-05-21 ENCOUNTER — Ambulatory Visit (INDEPENDENT_AMBULATORY_CARE_PROVIDER_SITE_OTHER): Payer: Commercial Managed Care - HMO | Admitting: Internal Medicine

## 2013-05-21 VITALS — BP 84/54 | HR 88 | Ht 58.6 in | Wt 95.1 lb

## 2013-05-21 DIAGNOSIS — Z933 Colostomy status: Secondary | ICD-10-CM

## 2013-05-21 DIAGNOSIS — D849 Immunodeficiency, unspecified: Secondary | ICD-10-CM

## 2013-05-21 DIAGNOSIS — M818 Other osteoporosis without current pathological fracture: Secondary | ICD-10-CM

## 2013-05-21 DIAGNOSIS — K5090 Crohn's disease, unspecified, without complications: Secondary | ICD-10-CM

## 2013-05-21 LAB — COMPREHENSIVE METABOLIC PANEL
ALBUMIN: 3.5 g/dL (ref 3.5–5.2)
ALT: 7 U/L (ref 0–35)
AST: 15 U/L (ref 0–37)
Alkaline Phosphatase: 98 U/L (ref 39–117)
BUN: 9 mg/dL (ref 6–23)
CALCIUM: 9.4 mg/dL (ref 8.4–10.5)
CHLORIDE: 97 meq/L (ref 96–112)
CO2: 25 mEq/L (ref 19–32)
Creatinine, Ser: 0.9 mg/dL (ref 0.4–1.2)
GFR: 70.97 mL/min (ref >60.00)
GLUCOSE: 99 mg/dL (ref 70–99)
POTASSIUM: 4.4 meq/L (ref 3.5–5.1)
Sodium: 132 mEq/L — ABNORMAL LOW (ref 135–145)
Total Bilirubin: 0.5 mg/dL (ref 0.3–1.2)
Total Protein: 7.7 g/dL (ref 6.0–8.3)

## 2013-05-21 LAB — CBC WITH DIFFERENTIAL/PLATELET
BASOS PCT: 0.4 % (ref 0.0–3.0)
Basophils Absolute: 0.1 10*3/uL (ref 0.0–0.1)
EOS ABS: 0.2 10*3/uL (ref 0.0–0.7)
EOS PCT: 1.3 % (ref 0.0–5.0)
HCT: 36.9 % (ref 36.0–46.0)
Hemoglobin: 12.4 g/dL (ref 12.0–15.0)
LYMPHS PCT: 25.6 % (ref 12.0–46.0)
Lymphs Abs: 3.3 10*3/uL (ref 0.7–4.0)
MCHC: 33.7 g/dL (ref 30.0–36.0)
MCV: 87.7 fl (ref 78.0–100.0)
Monocytes Absolute: 1.2 10*3/uL — ABNORMAL HIGH (ref 0.1–1.0)
Monocytes Relative: 9.5 % (ref 3.0–12.0)
NEUTROS PCT: 63.2 % (ref 43.0–77.0)
Neutro Abs: 8.1 10*3/uL — ABNORMAL HIGH (ref 1.4–7.7)
Platelets: 436 10*3/uL — ABNORMAL HIGH (ref 150.0–400.0)
RBC: 4.21 Mil/uL (ref 3.87–5.11)
RDW: 13.4 % (ref 11.5–14.6)
WBC: 12.8 10*3/uL — ABNORMAL HIGH (ref 4.5–10.5)

## 2013-05-21 LAB — SEDIMENTATION RATE: Sed Rate: 51 mm/hr — ABNORMAL HIGH (ref 0–22)

## 2013-05-21 MED ORDER — HYDROCODONE-ACETAMINOPHEN 5-325 MG PO TABS: 1.0000 | ORAL_TABLET | Freq: Two times a day (BID) | ORAL | Status: DC | PRN

## 2013-05-21 MED ORDER — MESALAMINE 1.2 G PO TBEC: 4.8000 g | DELAYED_RELEASE_TABLET | Freq: Every day | ORAL | Status: DC

## 2013-05-21 MED ORDER — DIAZEPAM 5 MG PO TABS: 5.0000 mg | ORAL_TABLET | Freq: Every day | ORAL | Status: DC | PRN

## 2013-05-21 MED ORDER — PREDNISONE 10 MG PO TABS: 10.0000 mg | ORAL_TABLET | Freq: Every day | ORAL | Status: DC

## 2013-05-21 MED ORDER — ADALIMUMAB 40 MG/0.8ML ~~LOC~~ KIT: 40.0000 mg | PACK | SUBCUTANEOUS | Status: DC

## 2013-05-21 NOTE — Patient Instructions (Addendum)
You have been given a separate informational sheet regarding your tobacco use, the importance of quitting and local resources to help you quit.  We have given you a TB skin test today. Please make certain to come back to the office for a reading between 48-72 hours from now to avoid requiring repeat testing.  Your physician has requested that you go to the basement for the following lab work before leaving today: CBC, CMET, Sed Rate, Vitamin D level  We have sent the following medications to your pharmacy for you to pick up at your convenience: Prednisone  We have given you a prescription for Hydrocodone and Valium to take to the pharmacy.  CC:Dr Debbrah Alar

## 2013-05-21 NOTE — Progress Notes (Signed)
[name] 09/22/1955 [id number]  Note: This dictation was prepared with Dragon digital system. Any transcriptional errors that result from this procedure are unintentional.   History of Present Illness:  This is a 58 year old white female with Crohn's disease of the colon, terminal ileum and rectum since 1986. She had a colon stricture resected in 1996. She had a diverting colostomy for intractable rectal disease in 2010 and. She has been on Humira 40 mg weekly. She has missed the last 3 weeks because her pt assistance program has run out. She needs to reapply . She is on prednisone 10 mg daily and mesalamine 4.8 g daily. She is due to have a TB skin test today. Her last colonoscopy in March 2011 via colostomy was normal. A sigmoidoscopy from 0-40 cm was also normal. She has no complaints today other than she fell and hurt the left side of the abdomen  Around colostomy.   Past Medical History  Diagnosis Date  . Ulcerative colitis, unspecified   . Coronary atherosclerosis of unspecified type of vessel, native or graft   . Anal fistula   . Arthritis   . Personal history of colonic polyps   . Crohn's colitis   . History of colostomy   . Heart murmur     Past Surgical History  Procedure Laterality Date  . Cholecystectomy    . Colon resection      1988 she thinks  . Angioplasty      1987  . Appendectomy    . Tubal ligation    . Colostomy  2012    Allergies  Allergen Reactions  . Remicade [Infliximab]     convulsions    Family history and social history have been reviewed.  Review of Systems: Denies heartburn. Weight gain of 30 pounds  The remainder of the 10 point ROS is negative except as outlined in the H&P  Physical Exam: General Appearance thin in no distress chronically ill-appearing Eyes  Non icteric  HEENT  Non traumatic, normocephalic  Mouth No lesion, tongue papillated, no cheilosis Neck Supple without adenopathy, thyroid not enlarged, no carotid bruits,  no JVD Lungs Clear to auscultation bilaterally COR Normal S1, normal S2, regular rhythm, no murmur, quiet precordium Abdomen soft tender in left lower quadrant and around the colostomy.stoma appears healthy Rectal not done Extremities  No pedal edema Skin No lesions Neurological Alert and oriented x 3 Psychological Normal mood and affect  Assessment and Plan:    Problem #1 Stable Crohn's disease of the colon, terminal ileum and rectum. Her disease has been under good control on Humira. I have signed her application for Humira Assistance.pprogram. She needs a refill on  Valium and hydrocodone as well as prednisone 10 mg daily. She is due for a TB skin test today. We will obtain a CBC, metabolic panel and sedimentation rate. I will see her in 4 months. Problem #2 Immunosupressed Problem #3 Smoker  [name] 05/21/2013

## 2013-05-22 ENCOUNTER — Other Ambulatory Visit: Payer: Self-pay | Admitting: *Deleted

## 2013-05-22 LAB — VITAMIN D 25 HYDROXY (VIT D DEFICIENCY, FRACTURES): Vit D, 25-Hydroxy: 19 ng/mL — ABNORMAL LOW (ref 30–89)

## 2013-05-22 MED ORDER — ERGOCALCIFEROL 1.25 MG (50000 UT) PO CAPS: ORAL_CAPSULE | ORAL | Status: DC

## 2013-05-23 LAB — TB SKIN TEST
INDURATION: 0 mm
TB SKIN TEST: NEGATIVE

## 2013-06-17 ENCOUNTER — Encounter (HOSPITAL_BASED_OUTPATIENT_CLINIC_OR_DEPARTMENT_OTHER): Payer: Self-pay | Admitting: Emergency Medicine

## 2013-06-17 ENCOUNTER — Emergency Department (HOSPITAL_BASED_OUTPATIENT_CLINIC_OR_DEPARTMENT_OTHER): Payer: Medicare HMO

## 2013-06-17 ENCOUNTER — Encounter: Payer: Self-pay | Admitting: Family

## 2013-06-17 ENCOUNTER — Ambulatory Visit (INDEPENDENT_AMBULATORY_CARE_PROVIDER_SITE_OTHER): Payer: Commercial Managed Care - HMO | Admitting: Family

## 2013-06-17 ENCOUNTER — Inpatient Hospital Stay (HOSPITAL_BASED_OUTPATIENT_CLINIC_OR_DEPARTMENT_OTHER)
Admission: EM | Admit: 2013-06-17 | Discharge: 2013-06-21 | DRG: 394 | Disposition: A | Discharge status: Home Health | Payer: Medicare HMO | Attending: Surgery | Admitting: Surgery

## 2013-06-17 VITALS — BP 108/68 | HR 131 | Temp 99.2°F | Resp 28 | Wt 85.0 lb

## 2013-06-17 DIAGNOSIS — Y833 Surgical operation with formation of external stoma as the cause of abnormal reaction of the patient, or of later complication, without mention of misadventure at the time of the procedure: Secondary | ICD-10-CM | POA: Diagnosis present

## 2013-06-17 DIAGNOSIS — K9412 Enterostomy infection: Secondary | ICD-10-CM | POA: Diagnosis present

## 2013-06-17 DIAGNOSIS — I959 Hypotension, unspecified: Secondary | ICD-10-CM | POA: Diagnosis not present

## 2013-06-17 DIAGNOSIS — Z79899 Other long term (current) drug therapy: Secondary | ICD-10-CM

## 2013-06-17 DIAGNOSIS — E876 Hypokalemia: Secondary | ICD-10-CM | POA: Diagnosis not present

## 2013-06-17 DIAGNOSIS — Z8249 Family history of ischemic heart disease and other diseases of the circulatory system: Secondary | ICD-10-CM

## 2013-06-17 DIAGNOSIS — IMO0002 Reserved for concepts with insufficient information to code with codable children: Secondary | ICD-10-CM

## 2013-06-17 DIAGNOSIS — Z860100 Personal history of colon polyps, unspecified: Secondary | ICD-10-CM

## 2013-06-17 DIAGNOSIS — L02219 Cutaneous abscess of trunk, unspecified: Secondary | ICD-10-CM | POA: Diagnosis present

## 2013-06-17 DIAGNOSIS — Z823 Family history of stroke: Secondary | ICD-10-CM

## 2013-06-17 DIAGNOSIS — R011 Cardiac murmur, unspecified: Secondary | ICD-10-CM | POA: Diagnosis present

## 2013-06-17 DIAGNOSIS — K5190 Ulcerative colitis, unspecified, without complications: Secondary | ICD-10-CM | POA: Diagnosis present

## 2013-06-17 DIAGNOSIS — R1032 Left lower quadrant pain: Secondary | ICD-10-CM | POA: Insufficient documentation

## 2013-06-17 DIAGNOSIS — K5090 Crohn's disease, unspecified, without complications: Secondary | ICD-10-CM

## 2013-06-17 DIAGNOSIS — F17200 Nicotine dependence, unspecified, uncomplicated: Secondary | ICD-10-CM | POA: Diagnosis present

## 2013-06-17 DIAGNOSIS — Z7982 Long term (current) use of aspirin: Secondary | ICD-10-CM

## 2013-06-17 DIAGNOSIS — K9402 Colostomy infection: Principal | ICD-10-CM | POA: Diagnosis present

## 2013-06-17 DIAGNOSIS — I2510 Atherosclerotic heart disease of native coronary artery without angina pectoris: Secondary | ICD-10-CM | POA: Diagnosis present

## 2013-06-17 DIAGNOSIS — Z801 Family history of malignant neoplasm of trachea, bronchus and lung: Secondary | ICD-10-CM

## 2013-06-17 DIAGNOSIS — R52 Pain, unspecified: Secondary | ICD-10-CM

## 2013-06-17 DIAGNOSIS — L02211 Cutaneous abscess of abdominal wall: Secondary | ICD-10-CM | POA: Diagnosis present

## 2013-06-17 DIAGNOSIS — L03319 Cellulitis of trunk, unspecified: Secondary | ICD-10-CM

## 2013-06-17 DIAGNOSIS — D62 Acute posthemorrhagic anemia: Secondary | ICD-10-CM | POA: Diagnosis not present

## 2013-06-17 LAB — COMPREHENSIVE METABOLIC PANEL
ALT: 8 U/L (ref 0–35)
AST: 23 U/L (ref 0–37)
Albumin: 2.5 g/dL — ABNORMAL LOW (ref 3.5–5.2)
Alkaline Phosphatase: 112 U/L (ref 39–117)
BUN: 18 mg/dL (ref 6–23)
CO2: 22 meq/L (ref 19–32)
CREATININE: 0.8 mg/dL (ref 0.50–1.10)
Calcium: 8.8 mg/dL (ref 8.4–10.5)
Chloride: 87 mEq/L — ABNORMAL LOW (ref 96–112)
GFR, EST NON AFRICAN AMERICAN: 80 mL/min — AB (ref >90)
GLUCOSE: 121 mg/dL — AB (ref 70–99)
Potassium: 3.6 mEq/L — ABNORMAL LOW (ref 3.7–5.3)
SODIUM: 127 meq/L — AB (ref 137–147)
Total Bilirubin: 0.5 mg/dL (ref 0.3–1.2)
Total Protein: 8 g/dL (ref 6.0–8.3)

## 2013-06-17 LAB — CBC WITH DIFFERENTIAL/PLATELET
Basophils Absolute: 0 10*3/uL (ref 0.0–0.1)
Basophils Relative: 0 % (ref 0–1)
EOS PCT: 0 % (ref 0–5)
Eosinophils Absolute: 0 10*3/uL (ref 0.0–0.7)
HEMATOCRIT: 32.7 % — AB (ref 36.0–46.0)
Hemoglobin: 11.2 g/dL — ABNORMAL LOW (ref 12.0–15.0)
LYMPHS ABS: 1.5 10*3/uL (ref 0.7–4.0)
Lymphocytes Relative: 5 % — ABNORMAL LOW (ref 12–46)
MCH: 29 pg (ref 26.0–34.0)
MCHC: 34.3 g/dL (ref 30.0–36.0)
MCV: 84.7 fL (ref 78.0–100.0)
MONOS PCT: 8 % (ref 3–12)
Monocytes Absolute: 2.3 10*3/uL — ABNORMAL HIGH (ref 0.1–1.0)
NEUTROS ABS: 25.5 10*3/uL — AB (ref 1.7–7.7)
Neutrophils Relative %: 87 % — ABNORMAL HIGH (ref 43–77)
Platelets: 586 10*3/uL — ABNORMAL HIGH (ref 150–400)
RBC: 3.86 MIL/uL — AB (ref 3.87–5.11)
RDW: 13.2 % (ref 11.5–15.5)
WBC: 29.3 10*3/uL — ABNORMAL HIGH (ref 4.0–10.5)

## 2013-06-17 LAB — URINALYSIS, ROUTINE W REFLEX MICROSCOPIC
Glucose, UA: NEGATIVE mg/dL
Hgb urine dipstick: NEGATIVE
Ketones, ur: 15 mg/dL — AB
NITRITE: NEGATIVE
PH: 6 (ref 5.0–8.0)
Protein, ur: 30 mg/dL — AB
SPECIFIC GRAVITY, URINE: 1.026 (ref 1.005–1.030)
UROBILINOGEN UA: 1 mg/dL (ref 0.0–1.0)

## 2013-06-17 LAB — URINE MICROSCOPIC-ADD ON

## 2013-06-17 MED ORDER — VANCOMYCIN HCL IN DEXTROSE 1-5 GM/200ML-% IV SOLN
1000.0000 mg | Freq: Once | INTRAVENOUS | Status: AC
  Administered 2013-06-17: 1000 mg via INTRAVENOUS
  Filled 2013-06-17: qty 200

## 2013-06-17 MED ORDER — HYDROMORPHONE HCL PF 1 MG/ML IJ SOLN
0.5000 mg | INTRAMUSCULAR | Status: DC | PRN
  Administered 2013-06-17 – 2013-06-18 (×5): 0.5 mg via INTRAVENOUS
  Filled 2013-06-17 (×5): qty 1

## 2013-06-17 MED ORDER — FENTANYL CITRATE 0.05 MG/ML IJ SOLN
50.0000 ug | Freq: Once | INTRAMUSCULAR | Status: AC
  Administered 2013-06-17: 50 ug via INTRAVENOUS
  Filled 2013-06-17: qty 2

## 2013-06-17 MED ORDER — FENTANYL CITRATE 0.05 MG/ML IJ SOLN
50.0000 ug | Freq: Once | INTRAMUSCULAR | Status: AC
  Administered 2013-06-17: 50 ug via INTRAVENOUS

## 2013-06-17 MED ORDER — HYDROMORPHONE HCL PF 1 MG/ML IJ SOLN
0.5000 mg | INTRAMUSCULAR | Status: DC | PRN
  Administered 2013-06-17: 0.5 mg via INTRAVENOUS
  Filled 2013-06-17: qty 1

## 2013-06-17 MED ORDER — FENTANYL CITRATE 0.05 MG/ML IJ SOLN
50.0000 ug | Freq: Once | INTRAMUSCULAR | Status: AC
  Administered 2013-06-17: 16:00:00 via INTRAVENOUS
  Filled 2013-06-17: qty 2

## 2013-06-17 MED ORDER — KCL IN DEXTROSE-NACL 20-5-0.9 MEQ/L-%-% IV SOLN
INTRAVENOUS | Status: DC
  Administered 2013-06-17 – 2013-06-19 (×5): via INTRAVENOUS
  Administered 2013-06-20 (×2): 125 mL via INTRAVENOUS
  Administered 2013-06-20: 11:00:00 via INTRAVENOUS
  Administered 2013-06-21: 125 mL via INTRAVENOUS
  Filled 2013-06-17 (×13): qty 1000

## 2013-06-17 MED ORDER — ONDANSETRON HCL 4 MG/2ML IJ SOLN: 4.0000 mg | Freq: Three times a day (TID) | INTRAMUSCULAR | Status: DC | PRN

## 2013-06-17 MED ORDER — SODIUM CHLORIDE 0.9 % IV SOLN
INTRAVENOUS | Status: AC
  Administered 2013-06-17: 22:00:00 via INTRAVENOUS

## 2013-06-17 MED ORDER — LORAZEPAM 2 MG/ML IJ SOLN: 0.5000 mg | Freq: Four times a day (QID) | INTRAMUSCULAR | Status: DC | PRN

## 2013-06-17 MED ORDER — PIPERACILLIN-TAZOBACTAM 3.375 G IVPB
3.3750 g | Freq: Three times a day (TID) | INTRAVENOUS | Status: DC
  Administered 2013-06-18 – 2013-06-21 (×11): 3.375 g via INTRAVENOUS
  Filled 2013-06-17 (×13): qty 50

## 2013-06-17 MED ORDER — ONDANSETRON HCL 4 MG/2ML IJ SOLN
4.0000 mg | Freq: Once | INTRAMUSCULAR | Status: AC
  Administered 2013-06-17: 4 mg via INTRAVENOUS
  Filled 2013-06-17: qty 2

## 2013-06-17 MED ORDER — HEPARIN SODIUM (PORCINE) 5000 UNIT/ML IJ SOLN
5000.0000 [IU] | Freq: Three times a day (TID) | INTRAMUSCULAR | Status: DC
  Administered 2013-06-17 – 2013-06-21 (×11): 5000 [IU] via SUBCUTANEOUS
  Filled 2013-06-17 (×14): qty 1

## 2013-06-17 MED ORDER — ONDANSETRON HCL 4 MG/2ML IJ SOLN: 4.0000 mg | Freq: Four times a day (QID) | INTRAMUSCULAR | Status: AC | PRN

## 2013-06-17 MED ORDER — IOHEXOL 300 MG/ML  SOLN
50.0000 mL | Freq: Once | INTRAMUSCULAR | Status: AC | PRN
  Administered 2013-06-17: 50 mL via ORAL

## 2013-06-17 MED ORDER — ACETAMINOPHEN 650 MG RE SUPP
650.0000 mg | Freq: Once | RECTAL | Status: AC
  Administered 2013-06-17: 650 mg via RECTAL
  Filled 2013-06-17: qty 1

## 2013-06-17 MED ORDER — FENTANYL CITRATE 0.05 MG/ML IJ SOLN
INTRAMUSCULAR | Status: AC
  Administered 2013-06-17: 50 ug via INTRAVENOUS
  Filled 2013-06-17: qty 2

## 2013-06-17 MED ORDER — SODIUM CHLORIDE 0.9 % IV BOLUS (SEPSIS)
1000.0000 mL | Freq: Once | INTRAVENOUS | Status: AC
  Administered 2013-06-17: 1000 mL via INTRAVENOUS

## 2013-06-17 MED ORDER — PIPERACILLIN-TAZOBACTAM 3.375 G IVPB 30 MIN
3.3750 g | Freq: Once | INTRAVENOUS | Status: AC
  Administered 2013-06-17: 3.375 g via INTRAVENOUS
  Filled 2013-06-17 (×2): qty 50

## 2013-06-17 MED ORDER — IOHEXOL 300 MG/ML  SOLN
100.0000 mL | Freq: Once | INTRAMUSCULAR | Status: AC | PRN
  Administered 2013-06-17: 100 mL via INTRAVENOUS

## 2013-06-17 MED ORDER — METHYLPREDNISOLONE SODIUM SUCC 40 MG IJ SOLR
40.0000 mg | Freq: Every day | INTRAMUSCULAR | Status: DC
  Administered 2013-06-18 – 2013-06-21 (×4): 40 mg via INTRAVENOUS
  Filled 2013-06-17 (×4): qty 1

## 2013-06-17 NOTE — Progress Notes (Signed)
Subjective:    Patient ID: [name], female    DOB: 24-Oct-1955, 58 y.o.   MRN: [id number]  HPI  [name] is a 58 yr old female who presents today with chief complaint of left lower abdominal pain.  Pain has been present x 1 week and is described as 10/10 pain. Pain is associated with a large abdominal mass. Has been confined to the bed for the last few days.  [name] reports tolerating small amounts of food and liquid.  Colostomy output has been normal except for a few days ago [name] pressed on the area and "white puss came out" of the colostomy.    Review of Systems See HPI  Past Medical History  Diagnosis Date  . Ulcerative colitis, unspecified   . Coronary atherosclerosis of unspecified type of vessel, native or graft   . Anal fistula   . Arthritis   . Personal history of colonic polyps   . Crohn's colitis   . History of colostomy   . Heart murmur     History   Social History  . Marital Status: Divorced    Spouse Name: N/A    Number of Children: 1  . Years of Education: N/A   Occupational History  . disabled    Social History Main Topics  . Smoking status: Current Some Day Smoker    Types: Cigarettes  . Smokeless tobacco: Never Used     Comment: 3 cigarettes daily  . Alcohol Use: No  . Drug Use: No  . Sexual Activity: Not on file   Other Topics Concern  . 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 Cholecystectomy    . Colon resection      1988 [name] thinks  . Angioplasty      1987  . Appendectomy    . Tubal ligation    . Colostomy  2012    Family History  Problem Relation Age of Onset  . Heart disease Brother   . Heart disease Mother   . Lung cancer Paternal Uncle   . Colon polyps Neg Hx   . Stroke Father      Allergies  Allergen Reactions  . Remicade [Infliximab]     convulsions    Current Outpatient Prescriptions on File Prior to Visit  Medication Sig Dispense Refill  . adalimumab (HUMIRA) 40 MG/0.8ML injection Inject 0.8 mLs (40 mg total) into the skin once a week.  1 each  0  . alendronate (FOSAMAX) 70 MG tablet Take with a full glass of water on an empty stomach. No food for 30 minutes after taking  4 tablet  11  . aspirin 81 MG tablet Take 81 mg by mouth daily.        . Calcium Carbonate-Vitamin D (CALCIUM-VITAMIN D) 600-125 MG-UNIT TABS Take 1200mg  with vitamin D daily.  60 each  0  . diazepam (VALIUM) 5 MG tablet Take 1 tablet (5 mg total) by mouth daily as needed.  30 tablet  1  . dicyclomine (BENTYL) 20 MG tablet Take 1 tablet by mouth twice daily as needed for crampy abdominal pain  60 tablet  2  . ergocalciferol (VITAMIN D2) 50000 UNITS capsule Take one po  weekly x 12 weeks  4 capsule  3  . HYDROcodone-acetaminophen (NORCO/VICODIN) 5-325 MG per tablet Take 1 tablet by mouth 2 (two) times daily as needed.  60 tablet  0  . mesalamine (LIALDA) 1.2 G EC tablet Take 4 tablets (4.8 g total) by mouth daily with breakfast.  192 tablet  0  . Multiple Vitamin (MULTIVITAMIN PO) Take by mouth. Take 1 tablet by mouth once daily       . predniSONE (DELTASONE) 10 MG tablet Take 1 tablet (10 mg total) by mouth daily. Take as directed  100 tablet  0   Current Facility-Administered Medications on File Prior to Visit  Medication Dose Route Frequency Provider Last Rate Last Dose  . pneumococcal 23 valent vaccine (PNU-IMMUNE) injection 0.5 mL  0.5 mL Intramuscular Once Lafayette Dragon, MD        BP 88/58  Pulse 131  Temp(Src) 99.2 F (37.3 C) (Oral)  Resp 28  Wt 85 lb 0.6 oz (38.574 kg)  SpO2 95%       Objective:   Physical Exam  Constitutional: [name] is oriented to person, place, and time. [name] appears well-developed and well-nourished.  Uncomfortable appearing thin white female seated on  exam table.   Cardiovascular: Tachycardia present.   Pulmonary/Chest: Tachypnea noted. [name] has no decreased breath sounds.  Abdominal:  Stoma is pink, large grapefruit sized mass beneath stoma which is hard and very tender to palpation.   Neurological: [name] is alert and oriented to person, place, and time.  Skin: Skin is warm and dry.  Psychiatric: [name] has a normal mood and affect. Her behavior is normal. Judgment and thought content normal.          Assessment & Plan:  Report given to [facility] RN at the med center ED who is awaiting patient.

## 2013-06-17 NOTE — Progress Notes (Signed)
Pre visit review using our clinic review tool, if applicable. No additional management support is needed unless otherwise documented below in the visit note. 

## 2013-06-17 NOTE — ED Notes (Signed)
Report given to [facility] 

## 2013-06-17 NOTE — ED Notes (Signed)
Pt from [place] office upstairs.  Reports that she fell 2 weeks ago and fell on her colostomy bag on her (L) lower abdomen.  States that she has had a lump in her abdomen since that time.  Had some yellow pus drainage after the fall into her colostomy bag.  Pt reports pain at the site of the lump.

## 2013-06-17 NOTE — H&P (Signed)
[name] is an 58 y.o. female.    Chief Complaint: pain and swelling at colostomy site  HPI: This is a 58 year old white female with Crohn's disease of the colon, terminal ileum and rectum since 1986. She had a colon stricture resected in 1996. She had a diverting colostomy for intractable rectal disease in 2010 and. She has been on Humira 40 mg weekly. . She is on prednisone 10 mg daily and mesalamine 4.8 g daily. . Her last colonoscopy in March 2011 via colostomy was normal. A sigmoidoscopy from 0-40 cm was also normal.She is followed regularly by Dr. [name] She states that approximately 4 weeks ago she fell and struck her abdomen near the colostomy site. She states that since that time she has had gradually worsening pain and swelling. At her colostomy site. At some point she had what looked like pus draining from the colostomy but this has stopped for about the past week. Over the past several days the pain and swelling has gotten much worse and she has been unable to get out of bed. She has been weak. She had not noted fever or chills but was febrile when she presented to the emergency department today. She has had no appetite and not much at all to either drink for about a week. Her ostomy continues to function normally. No nausea or vomiting. No blood per stoma.  Past Medical History  Diagnosis Date  . Ulcerative colitis, unspecified   . Coronary atherosclerosis of unspecified type of vessel, native or graft   . Anal fistula   . Arthritis   . Personal history of colonic polyps   . Crohn's colitis   . History of colostomy   . Heart murmur     Past Surgical History  Procedure Laterality Date  . Cholecystectomy    . Colon resection      1988 she thinks  . Angioplasty      1987  . Appendectomy    . Tubal ligation    . Colostomy  2012    Family History  Problem Relation Age of Onset  . Heart disease Brother   . Heart disease Mother   . Lung cancer Paternal Uncle   .  Colon polyps Neg Hx   . Stroke Father    Social History:  reports that she has been smoking Cigarettes.  She has been smoking about 0.00 packs per day. She has never used smokeless tobacco. She reports that she does not drink alcohol or use illicit drugs.  Allergies:  Allergies  Allergen Reactions  . Remicade [Infliximab]     convulsions    Medications Prior to Admission  Medication Dose Route Frequency Provider Last Rate Last Dose  . pneumococcal 23 valent vaccine (PNU-IMMUNE) injection 0.5 mL  0.5 mL Intramuscular Once Lafayette Dragon, MD       Medications Prior to Admission  Medication Sig Dispense Refill  . adalimumab (HUMIRA) 40 MG/0.8ML injection Inject 0.8 mLs (40 mg total) into the skin once a week.  1 each  0  . alendronate (FOSAMAX) 70 MG tablet Take with a full glass of water on an empty stomach. No food for 30 minutes after taking  4 tablet  11  . aspirin 81 MG tablet Take 81 mg by mouth daily.        . Calcium Carbonate-Vitamin D (CALCIUM-VITAMIN D) 600-125 MG-UNIT TABS Take $RemoveB'1200mg'EBsBNemJ$  with vitamin D daily.  60 each  0  . diazepam (VALIUM) 5 MG tablet  Take 1 tablet (5 mg total) by mouth daily as needed.  30 tablet  1  . dicyclomine (BENTYL) 20 MG tablet Take 1 tablet by mouth twice daily as needed for crampy abdominal pain  60 tablet  2  . ergocalciferol (VITAMIN D2) 50000 UNITS capsule Take one po weekly x 12 weeks  4 capsule  3  . HYDROcodone-acetaminophen (NORCO/VICODIN) 5-325 MG per tablet Take 1 tablet by mouth 2 (two) times daily as needed.  60 tablet  0  . mesalamine (LIALDA) 1.2 G EC tablet Take 4 tablets (4.8 g total) by mouth daily with breakfast.  192 tablet  0  . Multiple Vitamin (MULTIVITAMIN PO) Take by mouth. Take 1 tablet by mouth once daily       . predniSONE (DELTASONE) 10 MG tablet Take 1 tablet (10 mg total) by mouth daily. Take as directed  100 tablet  0    Results for orders placed during the hospital encounter of 06/17/13 (from the past 48 hour(s))  CBC  WITH DIFFERENTIAL     Status: Abnormal   Collection Time    06/17/13  3:47 PM      Result Value Ref Range   WBC 29.3 (*) 4.0 - 10.5 K/uL   RBC 3.86 (*) 3.87 - 5.11 MIL/uL   Hemoglobin 11.2 (*) 12.0 - 15.0 g/dL   HCT 32.7 (*) 36.0 - 46.0 %   MCV 84.7  78.0 - 100.0 fL   MCH 29.0  26.0 - 34.0 pg   MCHC 34.3  30.0 - 36.0 g/dL   RDW 13.2  11.5 - 15.5 %   Platelets 586 (*) 150 - 400 K/uL   Neutrophils Relative % 87 (*) 43 - 77 %   Lymphocytes Relative 5 (*) 12 - 46 %   Monocytes Relative 8  3 - 12 %   Eosinophils Relative 0  0 - 5 %   Basophils Relative 0  0 - 1 %   Neutro Abs 25.5 (*) 1.7 - 7.7 K/uL   Lymphs Abs 1.5  0.7 - 4.0 K/uL   Monocytes Absolute 2.3 (*) 0.1 - 1.0 K/uL   Eosinophils Absolute 0.0  0.0 - 0.7 K/uL   Basophils Absolute 0.0  0.0 - 0.1 K/uL  COMPREHENSIVE METABOLIC PANEL     Status: Abnormal   Collection Time    06/17/13  3:47 PM      Result Value Ref Range   Sodium 127 (*) 137 - 147 mEq/L   Potassium 3.6 (*) 3.7 - 5.3 mEq/L   Chloride 87 (*) 96 - 112 mEq/L   CO2 22  19 - 32 mEq/L   Glucose, Bld 121 (*) 70 - 99 mg/dL   BUN 18  6 - 23 mg/dL   Creatinine, Ser 0.80  0.50 - 1.10 mg/dL   Calcium 8.8  8.4 - 10.5 mg/dL   Total Protein 8.0  6.0 - 8.3 g/dL   Albumin 2.5 (*) 3.5 - 5.2 g/dL   AST 23  0 - 37 U/L   ALT 8  0 - 35 U/L   Alkaline Phosphatase 112  39 - 117 U/L   Total Bilirubin 0.5  0.3 - 1.2 mg/dL   GFR calc non Af Amer 80 (*) >90 mL/min   GFR calc Af Amer >90  >90 mL/min   Comment: (NOTE)     The eGFR has been calculated using the CKD EPI equation.     This calculation has not been validated in all clinical situations.  eGFR's persistently <90 mL/min signify possible Chronic Kidney     Disease.  URINALYSIS, ROUTINE W REFLEX MICROSCOPIC     Status: Abnormal   Collection Time    06/17/13  5:05 PM      Result Value Ref Range   Color, Urine AMBER (*) YELLOW   Comment: BIOCHEMICALS MAY BE AFFECTED BY COLOR   APPearance CLOUDY (*) CLEAR   Specific  Gravity, Urine 1.026  1.005 - 1.030   pH 6.0  5.0 - 8.0   Glucose, UA NEGATIVE  NEGATIVE mg/dL   Hgb urine dipstick NEGATIVE  NEGATIVE   Bilirubin Urine SMALL (*) NEGATIVE   Ketones, ur 15 (*) NEGATIVE mg/dL   Protein, ur 30 (*) NEGATIVE mg/dL   Urobilinogen, UA 1.0  0.0 - 1.0 mg/dL   Nitrite NEGATIVE  NEGATIVE   Leukocytes, UA SMALL (*) NEGATIVE  URINE MICROSCOPIC-ADD ON     Status: Abnormal   Collection Time    06/17/13  5:05 PM      Result Value Ref Range   Squamous Epithelial / LPF FEW (*) RARE   WBC, UA 3-6  <3 WBC/hpf   Bacteria, UA RARE  RARE   Ct Abdomen Pelvis W Contrast  06/17/2013   CLINICAL DATA:  Mass under of colostomy. History of Crohn's disease.  EXAM: CT ABDOMEN AND PELVIS WITH CONTRAST  TECHNIQUE: Multidetector CT imaging of the abdomen and pelvis was performed using the standard protocol following bolus administration of intravenous contrast.  CONTRAST:  30mL OMNIPAQUE IOHEXOL 300 MG/ML SOLN, 151mL OMNIPAQUE IOHEXOL 300 MG/ML SOLN  COMPARISON:  CT ABD/PELVIS W CM dated 04/12/2011  FINDINGS: Lung bases are clear.  No pericardial fluid.  No focal hepatic lesion. Patient status post cholecystectomy. The pancreas, spleen, adrenal glands, and kidneys are normal.  The stomach and small bowel are normal. Terminal ileum is grossly normal. There is retention of stool within the cecum and large volume stool in the transverse colon which is mildly dilated to 5.5 cm. There is a left lower quadrant colostomy. There is a new gas and air collection within the left abdominal wall adjacent to the colostomy tract. This has a thickened enhancing rim consistent with a peristomal abscess. This abscess measures 7.3 x 7.9 x 9.1 cm. The abscess is complex and extends into the muscular fascial planes and into the pre peritoneal space. There is no clear communication with the bowel.  The excluded sigmoid colon rectum are normal.  Abdominal aorta is normal caliber. No retroperitoneal periportal  lymphadenopathy. . No free fluid the pelvis. The bladder and uterus are normal. No aggressive osseous lesion.  IMPRESSION: 1. New large gas and fluid collection in the left abdominal wall adjacent to the colectomy tract is consistent with a large peristomal abscess. This abscess is complex and involves the muscular fascial planes and extends to the preperitoneal space. 2. No evidence fistulous connection with the bowel. 3. Peristomal abscess partially obstructs the colostomy tract by mass effect. There is significant stool retained within the transverse colon and cecum. No evidence of high-grade bowel obstruction. Findings conveyed toMELANIE BELFI on 06/17/2013  at18:19.   Electronically Signed   By: Suzy Bouchard M.D.   On: 06/17/2013 18:26    Review of Systems  Constitutional: Positive for fever, weight loss and malaise/fatigue. Negative for chills.  Respiratory: Negative.   Cardiovascular: Negative.   Gastrointestinal: Positive for abdominal pain. Negative for nausea, vomiting, diarrhea, constipation and blood in stool.  Genitourinary: Negative.   Musculoskeletal: Positive for joint  pain and myalgias.  Neurological: Positive for weakness. Negative for focal weakness, seizures and loss of consciousness.    Blood pressure 108/58, pulse 112, temperature 100.3 F (37.9 C), temperature source Oral, resp. rate 20, height 4' 11.5" (1.511 m), weight 85 lb (38.556 kg), SpO2 96.00%. Physical Exam  General: Alert, a thin chronically ill-appearing Caucasian female, in mild distress Skin: Warm and dry without rash or infection. HEENT: No palpable masses or thyromegaly. Sclera nonicteric. Pupils equal round and reactive. Oropharynx clear. Lymph nodes: No cervical, supraclavicular, or inguinal nodes palpable. Lungs: Breath sounds clear and equal without increased work of breathing Cardiovascular: Regular tachycardia with 2/6 systolic murmur.Marland Kitchen No JVD or edema. Peripheral pulses intact. Abdomen:  Nondistended.colostomy present in left lower quadrant and appears healthy through colostomy bag. There is a large, approximately 15 cm area of swelling and mild erythema and marked tenderness surrounding the stoma, more on the lateral side. The remainder of her abdomen is soft and nontender. Extremities: Thin  No edema or joint swelling or deformity. No chronic venous stasis changes. Neurologic: Alert and fully oriented. Affect normal  Assessment/Plan Long history of Crohn's disease as above. She now has a large peristomal abscess in the abdominal wall. The patient is being started on broad-spectrum IV antibiotics. Will be made n.p.o. And plan drainage of her peristomal abscess in the operating room tomorrow.  Darene Lamer Fontaine Hehl 06/17/2013, 10:16 PM

## 2013-06-17 NOTE — ED Notes (Signed)
[facility] at bedside. MD informed of the fever

## 2013-06-17 NOTE — ED Notes (Addendum)
Report given. Pending for [facility]

## 2013-06-17 NOTE — ED Provider Notes (Addendum)
CSN: [id number]     Arrival date & time 06/17/13  1528 History  This chart was scribed for Malvin Johns, MD by Celesta Gentile, ED Scribe. The patient was seen in room [place]. Patient's care was started at 3:41 PM.  Chief Complaint  Patient presents with  . Ulcerative Colitis   The history is provided by the patient. No language interpreter was used.   HPI Comments: [name] is a 58 y.o. female with a h/o ulcerative colitis presents to the Emergency Department complaining of a worsening painful lump to her LLQ near her colostomy bag.  Pt states two weeks ago she fell on her colostomy bag, after which she noticed an increasingly large and painful mass in her LLQ around her stoma.  Pt states after her fall she was experiencing yellowish pus from her colostomy bag when she applied pressure to the area. The area has been getting increasingly painful.  She currently denies pus or blood in her output.  She denies nausea and emesis.  Pt reports she had her colostomy bag placed about 4 years ago.  She states she had crohns in her rectum and vagina, and physicians informed her the only way to prevent further complications was to permanently place a colostomy bag.  Pt reports Dr. Olevia Perches is her GI physician.  The colostomy was placed by Dr. Hassell Done. Pt reports her blood pressure is normally low.  She states she has a h/o cholecystectomy, appendectomy, and a colon resection.     Past Medical History  Diagnosis Date  . Ulcerative colitis, unspecified   . Coronary atherosclerosis of unspecified type of vessel, native or graft   . Anal fistula   . Arthritis   . Personal history of colonic polyps   . Crohn's colitis   . History of colostomy   . Heart murmur    Past Surgical History  Procedure Laterality Date  . Cholecystectomy    . Colon resection      1988 she thinks  . Angioplasty      1987  . Appendectomy    . Tubal ligation    . Colostomy  2012   Family History  Problem Relation Age of  Onset  . Heart disease Brother   . Heart disease Mother   . Lung cancer Paternal Uncle   . Colon polyps Neg Hx   . Stroke Father    History  Substance Use Topics  . Smoking status: Current Some Day Smoker    Types: Cigarettes  . Smokeless tobacco: Never Used     Comment: 3 cigarettes daily  . Alcohol Use: No   OB History   Grav Para Term Preterm Abortions TAB SAB Ect Mult Living                 Review of Systems  Constitutional: Negative for fever, chills, diaphoresis and fatigue.  HENT: Negative for congestion, ear pain, rhinorrhea, sneezing and sore throat.   Eyes: Negative.   Respiratory: Negative for cough, chest tightness and shortness of breath.   Cardiovascular: Negative for chest pain and leg swelling.  Gastrointestinal: Positive for abdominal pain. Negative for nausea, vomiting, diarrhea, constipation and blood in stool.       Large mass in her LLQ.  Genitourinary: Negative for frequency, hematuria, flank pain and difficulty urinating.  Musculoskeletal: Negative for arthralgias and back pain.  Skin: Negative for color change and rash.  Neurological: Negative for dizziness, speech difficulty, weakness, numbness and headaches.  Psychiatric/Behavioral: Negative for  behavioral problems and confusion.  All other systems reviewed and are negative.   Allergies  Remicade  Home Medications   Current Outpatient Rx  Name  Route  Sig  Dispense  Refill  . adalimumab (HUMIRA) 40 MG/0.8ML injection   Subcutaneous   Inject 0.8 mLs (40 mg total) into the skin once a week.   1 each   0     PLEASE GIVE PENS! Pt new insurance is [employer]  ...   . alendronate (FOSAMAX) 70 MG tablet      Take with a full glass of water on an empty stomach. No food for 30 minutes after taking   4 tablet   11   . aspirin 81 MG tablet   Oral   Take 81 mg by mouth daily.           . Calcium Carbonate-Vitamin D (CALCIUM-VITAMIN D) 600-125 MG-UNIT TABS      Take 1200mg  with vitamin D  daily.   60 each   0   . diazepam (VALIUM) 5 MG tablet   Oral   Take 1 tablet (5 mg total) by mouth daily as needed.   30 tablet   1   . dicyclomine (BENTYL) 20 MG tablet      Take 1 tablet by mouth twice daily as needed for crampy abdominal pain   60 tablet   2   . ergocalciferol (VITAMIN D2) 50000 UNITS capsule      Take one po weekly x 12 weeks   4 capsule   3   . HYDROcodone-acetaminophen (NORCO/VICODIN) 5-325 MG per tablet   Oral   Take 1 tablet by mouth 2 (two) times daily as needed.   60 tablet   0   . mesalamine (LIALDA) 1.2 G EC tablet   Oral   Take 4 tablets (4.8 g total) by mouth daily with breakfast.   192 tablet   0     Lot mf [id number]  Exp 05/2015 Number 144 Lot [id number] ...   . Multiple Vitamin (MULTIVITAMIN PO)   Oral   Take by mouth. Take 1 tablet by mouth once daily          . predniSONE (DELTASONE) 10 MG tablet   Oral   Take 1 tablet (10 mg total) by mouth daily. Take as directed   100 tablet   0    Triage Vitals: BP 106/67  Pulse 128  Temp(Src) 99.1 F (37.3 C) (Oral)  Resp 18  SpO2 97%  Physical Exam  Nursing note and vitals reviewed. Constitutional: She is oriented to person, place, and time. She appears well-developed and well-nourished. No distress.  HENT:  Head: Normocephalic and atraumatic.  Eyes: EOM are normal. Right eye exhibits no discharge. Left eye exhibits no discharge.  Slightly pale conjunctiva.    Neck: Neck supple. No tracheal deviation present.  Cardiovascular: Regular rhythm, normal heart sounds and intact distal pulses.  Tachycardia present.  Exam reveals no gallop and no friction rub.   No murmur heard. Pulmonary/Chest: Effort normal. No respiratory distress. She has no wheezes. She has no rales.  Abdominal: Soft. She exhibits no distension and no mass. Bowel sounds are decreased. There is tenderness in the left lower quadrant. There is no rebound and no guarding.  Colostomy to the LLQ with brown stool.  There  is a large mass under the colostomy, which is markedly tender to palpation.  Hypoactive bowel sounds.  erythema to the abdominal wall inferior to the  stoma  Musculoskeletal: Normal range of motion.  Neurological: She is alert and oriented to person, place, and time.  Skin: Skin is warm and dry. No rash noted.  Psychiatric: She has a normal mood and affect. Her behavior is normal.    ED Course  Procedures (including critical care time) DIAGNOSTIC STUDIES: Oxygen Saturation is 97% on RA, normal by my interpretation.    COORDINATION OF CARE: 3:52 PM-Will order CT of abdomen and pelvis, UA, CBC with differential, and comprehensive metabolic panel.  Will order IV fluids, Zofran, and Sublimaze.  Patient informed of current plan of treatment and evaluation and agrees with plan.    Results for orders placed during the hospital encounter of 06/17/13  CBC WITH DIFFERENTIAL      Result Value Ref Range   WBC 29.3 (*) 4.0 - 10.5 K/uL   RBC 3.86 (*) 3.87 - 5.11 MIL/uL   Hemoglobin 11.2 (*) 12.0 - 15.0 g/dL   HCT 32.7 (*) 36.0 - 46.0 %   MCV 84.7  78.0 - 100.0 fL   MCH 29.0  26.0 - 34.0 pg   MCHC 34.3  30.0 - 36.0 g/dL   RDW 13.2  11.5 - 15.5 %   Platelets 586 (*) 150 - 400 K/uL   Neutrophils Relative % 87 (*) 43 - 77 %   Lymphocytes Relative 5 (*) 12 - 46 %   Monocytes Relative 8  3 - 12 %   Eosinophils Relative 0  0 - 5 %   Basophils Relative 0  0 - 1 %   Neutro Abs 25.5 (*) 1.7 - 7.7 K/uL   Lymphs Abs 1.5  0.7 - 4.0 K/uL   Monocytes Absolute 2.3 (*) 0.1 - 1.0 K/uL   Eosinophils Absolute 0.0  0.0 - 0.7 K/uL   Basophils Absolute 0.0  0.0 - 0.1 K/uL  COMPREHENSIVE METABOLIC PANEL      Result Value Ref Range   Sodium 127 (*) 137 - 147 mEq/L   Potassium 3.6 (*) 3.7 - 5.3 mEq/L   Chloride 87 (*) 96 - 112 mEq/L   CO2 22  19 - 32 mEq/L   Glucose, Bld 121 (*) 70 - 99 mg/dL   BUN 18  6 - 23 mg/dL   Creatinine, Ser 0.80  0.50 - 1.10 mg/dL   Calcium 8.8  8.4 - 10.5 mg/dL   Total Protein 8.0   6.0 - 8.3 g/dL   Albumin 2.5 (*) 3.5 - 5.2 g/dL   AST 23  0 - 37 U/L   ALT 8  0 - 35 U/L   Alkaline Phosphatase 112  39 - 117 U/L   Total Bilirubin 0.5  0.3 - 1.2 mg/dL   GFR calc non Af Amer 80 (*) >90 mL/min   GFR calc Af Amer >90  >90 mL/min  URINALYSIS, ROUTINE W REFLEX MICROSCOPIC      Result Value Ref Range   Color, Urine AMBER (*) YELLOW   APPearance CLOUDY (*) CLEAR   Specific Gravity, Urine 1.026  1.005 - 1.030   pH 6.0  5.0 - 8.0   Glucose, UA NEGATIVE  NEGATIVE mg/dL   Hgb urine dipstick NEGATIVE  NEGATIVE   Bilirubin Urine SMALL (*) NEGATIVE   Ketones, ur 15 (*) NEGATIVE mg/dL   Protein, ur 30 (*) NEGATIVE mg/dL   Urobilinogen, UA 1.0  0.0 - 1.0 mg/dL   Nitrite NEGATIVE  NEGATIVE   Leukocytes, UA SMALL (*) NEGATIVE  URINE MICROSCOPIC-ADD ON  Result Value Ref Range   Squamous Epithelial / LPF FEW (*) RARE   WBC, UA 3-6  <3 WBC/hpf   Bacteria, UA RARE  RARE    Imaging Review Ct Abdomen Pelvis W Contrast  06/17/2013   CLINICAL DATA:  Mass under of colostomy. History of Crohn's disease.  EXAM: CT ABDOMEN AND PELVIS WITH CONTRAST  TECHNIQUE: Multidetector CT imaging of the abdomen and pelvis was performed using the standard protocol following bolus administration of intravenous contrast.  CONTRAST:  72mL OMNIPAQUE IOHEXOL 300 MG/ML SOLN, 173mL OMNIPAQUE IOHEXOL 300 MG/ML SOLN  COMPARISON:  CT ABD/PELVIS W CM dated 04/12/2011  FINDINGS: Lung bases are clear.  No pericardial fluid.  No focal hepatic lesion. Patient status post cholecystectomy. The pancreas, spleen, adrenal glands, and kidneys are normal.  The stomach and small bowel are normal. Terminal ileum is grossly normal. There is retention of stool within the cecum and large volume stool in the transverse colon which is mildly dilated to 5.5 cm. There is a left lower quadrant colostomy. There is a new gas and air collection within the left abdominal wall adjacent to the colostomy tract. This has a thickened enhancing rim  consistent with a peristomal abscess. This abscess measures 7.3 x 7.9 x 9.1 cm. The abscess is complex and extends into the muscular fascial planes and into the pre peritoneal space. There is no clear communication with the bowel.  The excluded sigmoid colon rectum are normal.  Abdominal aorta is normal caliber. No retroperitoneal periportal lymphadenopathy. . No free fluid the pelvis. The bladder and uterus are normal. No aggressive osseous lesion.  IMPRESSION: 1. New large gas and fluid collection in the left abdominal wall adjacent to the colectomy tract is consistent with a large peristomal abscess. This abscess is complex and involves the muscular fascial planes and extends to the preperitoneal space. 2. No evidence fistulous connection with the bowel. 3. Peristomal abscess partially obstructs the colostomy tract by mass effect. There is significant stool retained within the transverse colon and cecum. No evidence of high-grade bowel obstruction. Findings conveyed toMELANIE Kamariah Fruchter on 06/17/2013  at18:19.   Electronically Signed   By: Suzy Bouchard M.D.   On: 06/17/2013 18:26    MDM   Final diagnoses:  Abdominal wall abscess   PT with large parastomal abscess.  Started on IV abx, fluids.  Discussed with Dr. Excell Seltzer with surgery, will admit to [facility].  I personally performed the services described in this documentation, which was scribed in my presence.  The recorded information has been reviewed and considered.       Malvin Johns, MD 06/17/13 1740  Malvin Johns, MD 06/17/13 8144

## 2013-06-17 NOTE — Assessment & Plan Note (Signed)
+   low grade temp, tachycardia, hypotension, tachypnea, acute abdomen on exam.  ? Strangulated hernia.  Refer to ED for further evaluation.  Needs CT abdomen/pelvis and possible surgical evaluation.

## 2013-06-17 NOTE — Progress Notes (Signed)
Patient ID: [name], female   DOB: 1956-03-05, 58 y.o.   MRN: [id number] Subjective:

## 2013-06-18 ENCOUNTER — Encounter (HOSPITAL_COMMUNITY): Payer: Medicare HMO | Admitting: Anesthesiology

## 2013-06-18 ENCOUNTER — Encounter (HOSPITAL_COMMUNITY): Payer: Self-pay | Admitting: Certified Registered Nurse Anesthetist

## 2013-06-18 ENCOUNTER — Inpatient Hospital Stay (HOSPITAL_COMMUNITY): Payer: Medicare HMO

## 2013-06-18 ENCOUNTER — Inpatient Hospital Stay (HOSPITAL_COMMUNITY): Payer: Medicare HMO | Admitting: Anesthesiology

## 2013-06-18 ENCOUNTER — Telehealth: Payer: Self-pay | Admitting: Internal Medicine

## 2013-06-18 ENCOUNTER — Encounter (HOSPITAL_COMMUNITY): Admission: EM | Disposition: A | Discharge status: Home Health | Payer: Self-pay | Source: Home / Self Care

## 2013-06-18 ENCOUNTER — Telehealth: Payer: Self-pay | Admitting: Family

## 2013-06-18 HISTORY — PX: IRRIGATION AND DEBRIDEMENT ABSCESS: SHX5252

## 2013-06-18 LAB — MRSA PCR SCREENING: MRSA by PCR: POSITIVE — AB

## 2013-06-18 LAB — BASIC METABOLIC PANEL
BUN: 9 mg/dL (ref 6–23)
CO2: 21 meq/L (ref 19–32)
Calcium: 7.5 mg/dL — ABNORMAL LOW (ref 8.4–10.5)
Chloride: 97 mEq/L (ref 96–112)
Creatinine, Ser: 0.62 mg/dL (ref 0.50–1.10)
GFR calc Af Amer: >90 mL/min (ref >90)
GLUCOSE: 113 mg/dL — AB (ref 70–99)
POTASSIUM: 3.4 meq/L — AB (ref 3.7–5.3)
Sodium: 130 mEq/L — ABNORMAL LOW (ref 137–147)

## 2013-06-18 LAB — MAGNESIUM: Magnesium: 1.7 mg/dL (ref 1.5–2.5)

## 2013-06-18 LAB — GRAM STAIN

## 2013-06-18 SURGERY — IRRIGATION AND DEBRIDEMENT ABSCESS
Anesthesia: General | Site: Abdomen

## 2013-06-18 MED ORDER — MIDAZOLAM HCL 5 MG/5ML IJ SOLN
INTRAMUSCULAR | Status: DC | PRN
  Administered 2013-06-18: 2 mg via INTRAVENOUS

## 2013-06-18 MED ORDER — PROPOFOL 10 MG/ML IV BOLUS
INTRAVENOUS | Status: DC | PRN
  Administered 2013-06-18: 150 mg via INTRAVENOUS

## 2013-06-18 MED ORDER — SUCCINYLCHOLINE CHLORIDE 20 MG/ML IJ SOLN
INTRAMUSCULAR | Status: DC | PRN
  Administered 2013-06-18: 100 mg via INTRAVENOUS

## 2013-06-18 MED ORDER — MUPIROCIN 2 % EX OINT
1.0000 "application " | TOPICAL_OINTMENT | Freq: Two times a day (BID) | CUTANEOUS | Status: DC
  Administered 2013-06-18 – 2013-06-21 (×7): 1 via NASAL
  Filled 2013-06-18: qty 22

## 2013-06-18 MED ORDER — ONDANSETRON HCL 4 MG/2ML IJ SOLN
INTRAMUSCULAR | Status: DC | PRN
  Administered 2013-06-18: 4 mg via INTRAVENOUS

## 2013-06-18 MED ORDER — PHENYLEPHRINE HCL 10 MG/ML IJ SOLN
INTRAMUSCULAR | Status: DC | PRN
  Administered 2013-06-18: 80 ug via INTRAVENOUS

## 2013-06-18 MED ORDER — 0.9 % SODIUM CHLORIDE (POUR BTL) OPTIME: TOPICAL | Status: DC | PRN

## 2013-06-18 MED ORDER — LACTATED RINGERS IV BOLUS (SEPSIS)
1000.0000 mL | Freq: Once | INTRAVENOUS | Status: AC
  Administered 2013-06-18: 1000 mL via INTRAVENOUS

## 2013-06-18 MED ORDER — OXYCODONE-ACETAMINOPHEN 5-325 MG PO TABS
1.0000 | ORAL_TABLET | ORAL | Status: DC | PRN
  Administered 2013-06-19 (×3): 1 via ORAL
  Filled 2013-06-18 (×3): qty 1

## 2013-06-18 MED ORDER — DEXAMETHASONE SODIUM PHOSPHATE 10 MG/ML IJ SOLN
INTRAMUSCULAR | Status: AC
  Filled 2013-06-18: qty 1

## 2013-06-18 MED ORDER — PROMETHAZINE HCL 25 MG/ML IJ SOLN: 6.2500 mg | INTRAMUSCULAR | Status: DC | PRN

## 2013-06-18 MED ORDER — LACTATED RINGERS IV SOLN
INTRAVENOUS | Status: DC
  Administered 2013-06-18: 11:00:00 via INTRAVENOUS
  Administered 2013-06-18: 1000 mL via INTRAVENOUS

## 2013-06-18 MED ORDER — ADULT MULTIVITAMIN W/MINERALS CH
1.0000 | ORAL_TABLET | Freq: Every day | ORAL | Status: DC
  Administered 2013-06-19 – 2013-06-21 (×3): 1 via ORAL
  Filled 2013-06-18 (×4): qty 1

## 2013-06-18 MED ORDER — HYDROMORPHONE HCL PF 1 MG/ML IJ SOLN
0.5000 mg | INTRAMUSCULAR | Status: AC | PRN
  Administered 2013-06-18 (×2): 2 mg via INTRAVENOUS
  Administered 2013-06-18: 1 mg via INTRAVENOUS
  Filled 2013-06-18 (×2): qty 2
  Filled 2013-06-18: qty 1

## 2013-06-18 MED ORDER — LIDOCAINE HCL (CARDIAC) 20 MG/ML IV SOLN
INTRAVENOUS | Status: DC | PRN
  Administered 2013-06-18: 50 mg via INTRAVENOUS

## 2013-06-18 MED ORDER — FENTANYL CITRATE 0.05 MG/ML IJ SOLN
INTRAMUSCULAR | Status: AC
  Filled 2013-06-18: qty 2

## 2013-06-18 MED ORDER — BOOST / RESOURCE BREEZE PO LIQD
1.0000 | Freq: Three times a day (TID) | ORAL | Status: DC
  Administered 2013-06-18 – 2013-06-20 (×5): 1 via ORAL

## 2013-06-18 MED ORDER — PROPOFOL 10 MG/ML IV BOLUS
INTRAVENOUS | Status: AC
  Filled 2013-06-18: qty 20

## 2013-06-18 MED ORDER — HYDROMORPHONE HCL PF 1 MG/ML IJ SOLN
0.2500 mg | INTRAMUSCULAR | Status: DC | PRN
  Administered 2013-06-18: 0.25 mg via INTRAVENOUS

## 2013-06-18 MED ORDER — MIDAZOLAM HCL 2 MG/2ML IJ SOLN
INTRAMUSCULAR | Status: AC
  Filled 2013-06-18: qty 2

## 2013-06-18 MED ORDER — FENTANYL CITRATE 0.05 MG/ML IJ SOLN
INTRAMUSCULAR | Status: DC | PRN
  Administered 2013-06-18 (×2): 50 ug via INTRAVENOUS

## 2013-06-18 MED ORDER — LACTATED RINGERS IV BOLUS (SEPSIS)
1000.0000 mL | Freq: Once | INTRAVENOUS | Status: AC
  Administered 2013-06-19: 1000 mL via INTRAVENOUS

## 2013-06-18 MED ORDER — DEXAMETHASONE SODIUM PHOSPHATE 10 MG/ML IJ SOLN
INTRAMUSCULAR | Status: DC | PRN
  Administered 2013-06-18: 10 mg via INTRAVENOUS

## 2013-06-18 MED ORDER — LIDOCAINE HCL (CARDIAC) 20 MG/ML IV SOLN
INTRAVENOUS | Status: AC
  Filled 2013-06-18: qty 5

## 2013-06-18 MED ORDER — HYDROMORPHONE HCL PF 1 MG/ML IJ SOLN
INTRAMUSCULAR | Status: AC
  Filled 2013-06-18: qty 1

## 2013-06-18 MED ORDER — ACETAMINOPHEN 325 MG PO TABS
650.0000 mg | ORAL_TABLET | Freq: Once | ORAL | Status: AC
  Administered 2013-06-18: 650 mg via ORAL
  Filled 2013-06-18: qty 2

## 2013-06-18 MED ORDER — KETOROLAC TROMETHAMINE 30 MG/ML IJ SOLN
INTRAMUSCULAR | Status: AC
  Filled 2013-06-18: qty 1

## 2013-06-18 MED ORDER — 0.9 % SODIUM CHLORIDE (POUR BTL) OPTIME
TOPICAL | Status: DC | PRN
  Administered 2013-06-18: 4000 mL

## 2013-06-18 MED ORDER — ONDANSETRON HCL 4 MG/2ML IJ SOLN
INTRAMUSCULAR | Status: AC
  Filled 2013-06-18: qty 2

## 2013-06-18 MED ORDER — CHLORHEXIDINE GLUCONATE CLOTH 2 % EX PADS
6.0000 | MEDICATED_PAD | Freq: Every day | CUTANEOUS | Status: DC
  Administered 2013-06-18 – 2013-06-21 (×3): 6 via TOPICAL

## 2013-06-18 SURGICAL SUPPLY — 37 items
BLADE HEX COATED 2.75 (ELECTRODE) ×3 IMPLANT
BLADE SURG SZ10 CARB STEEL (BLADE) IMPLANT
CANISTER SUCTION 2500CC (MISCELLANEOUS) IMPLANT
DECANTER SPIKE VIAL GLASS SM (MISCELLANEOUS) IMPLANT
DERMABOND ADVANCED (GAUZE/BANDAGES/DRESSINGS)
DERMABOND ADVANCED .7 DNX12 (GAUZE/BANDAGES/DRESSINGS) IMPLANT
DRAPE LAPAROSCOPIC ABDOMINAL (DRAPES) IMPLANT
DRAPE LAPAROTOMY T 102X78X121 (DRAPES) IMPLANT
DRAPE LAPAROTOMY TRNSV 102X78 (DRAPE) IMPLANT
DRAPE LG THREE QUARTER DISP (DRAPES) IMPLANT
ELECT REM PT RETURN 9FT ADLT (ELECTROSURGICAL) ×3
ELECTRODE REM PT RTRN 9FT ADLT (ELECTROSURGICAL) ×1 IMPLANT
GLOVE BIO SURGEON STRL SZ7 (GLOVE) ×9 IMPLANT
GLOVE BIO SURGEON STRL SZ7.5 (GLOVE) ×9 IMPLANT
GLOVE BIOGEL M STRL SZ7.5 (GLOVE) IMPLANT
GLOVE BIOGEL PI IND STRL 7.0 (GLOVE) ×1 IMPLANT
GLOVE BIOGEL PI INDICATOR 7.0 (GLOVE) ×2
GLOVE INDICATOR 8.0 STRL GRN (GLOVE) ×9 IMPLANT
GOWN STRL REUS W/ TWL XL LVL3 (GOWN DISPOSABLE) IMPLANT
GOWN STRL REUS W/TWL LRG LVL3 (GOWN DISPOSABLE) IMPLANT
GOWN STRL REUS W/TWL XL LVL3 (GOWN DISPOSABLE) ×9 IMPLANT
KIT BASIN OR (CUSTOM PROCEDURE TRAY) ×3 IMPLANT
MARKER SKIN DUAL TIP RULER LAB (MISCELLANEOUS) ×3 IMPLANT
NEEDLE HYPO 25X1 1.5 SAFETY (NEEDLE) IMPLANT
NS IRRIG 1000ML POUR BTL (IV SOLUTION) ×6 IMPLANT
PACK GENERAL/GYN (CUSTOM PROCEDURE TRAY) ×3 IMPLANT
PENCIL BUTTON HOLSTER BLD 10FT (ELECTRODE) IMPLANT
SPONGE GAUZE 4X4 12PLY (GAUZE/BANDAGES/DRESSINGS) ×3 IMPLANT
SPONGE LAP 18X18 X RAY DECT (DISPOSABLE) IMPLANT
SPONGE LAP 4X18 X RAY DECT (DISPOSABLE) ×3 IMPLANT
STAPLER VISISTAT 35W (STAPLE) IMPLANT
SUT MNCRL AB 4-0 PS2 18 (SUTURE) IMPLANT
SUT VIC AB 3-0 SH 18 (SUTURE) IMPLANT
SYR CONTROL 10ML LL (SYRINGE) IMPLANT
TOWEL OR 17X26 10 PK STRL BLUE (TOWEL DISPOSABLE) ×3 IMPLANT
TUBE ANAEROBIC SPECIMEN COL (MISCELLANEOUS) ×3 IMPLANT
YANKAUER SUCT BULB TIP 10FT TU (MISCELLANEOUS) ×3 IMPLANT

## 2013-06-18 NOTE — Interval H&P Note (Signed)
History and Physical Interval Note:  06/18/2013 8:37 AM  [name]  has presented today for surgery, with the diagnosis of peristomal abcess  The various methods of treatment have been discussed with the patient and family. After consideration of risks, benefits and other options for treatment, the patient has consented to  Procedure(s): Irrigation and Debridiment of peristomal abcess (N/A) as a surgical intervention .  The patient's history has been reviewed, patient examined, no change in status, stable for surgery.  I have reviewed the patient's chart and labs.  Questions were answered to the patient's satisfaction.    Leighton Ruff. Redmond Pulling, MD, [place], Bariatric, & Minimally Invasive Surgery [facility], [place]    Gayland Curry

## 2013-06-18 NOTE — Progress Notes (Signed)
INITIAL NUTRITION ASSESSMENT  DOCUMENTATION CODES Per approved criteria  -Underweight   INTERVENTION: - Resource Breeze TID - Diet advancement per MD - Multivitamin 1 tablet PO daily - Will continue to monitor   NUTRITION DIAGNOSIS: Inadequate oral intake related to clear liquid diet as evidenced by diet order.   Goal: Advance diet as tolerated to regular diet  Monitor:  Weights, labs, diet advancement  Reason for Assessment: Underweight   58 y.o. female  Admitting Dx: Pain and swelling at colostomy site  ASSESSMENT: Hx of Crohn's disease of the colon, terminal ileum and rectum since 1986. She had a colon stricture resected in 1996. She had a diverting colostomy for intractable rectal disease in 2010. Pt fell 2 weeks ago and fell on her colostomy bag, and reports lump in abdomen since that time. Found to have large peristomal abscess in abdominal wall. Had incision and drainage of abscess today.   Met with pt and daughter who report pt with poor appetite for the past week r/t not feeling well. Would take a few bites of food at mealtimes and that's all. When she was feeling better she would eat 2 well balanced meals/day. Reports she gained 2 pounds recently but then lost the weight. Not on any nutritional supplements at home but has been on Resource Breeze in the hospital in the past. Pt eager to start clear liquids due to being hungry.   Pt with mild/moderate fat loss in upper body.   Potassium low    Height: Ht Readings from Last 1 Encounters:  06/17/13 4' 11.5" (1.511 m)    Weight: Wt Readings from Last 1 Encounters:  06/17/13 85 lb (38.556 kg)    Ideal Body Weight: 100 lbs   % Ideal Body Weight: 85%  Wt Readings from Last 10 Encounters:  06/17/13 85 lb (38.556 kg)  06/17/13 85 lb (38.556 kg)  06/17/13 85 lb 0.6 oz (38.574 kg)  05/21/13 95 lb 2 oz (43.148 kg)  05/17/13 97 lb 0.6 oz (44.017 kg)  01/15/13 92 lb 4 oz (41.844 kg)  11/06/12 92 lb 6 oz (41.901  kg)  05/15/12 103 lb (46.72 kg)  02/15/12 106 lb (48.081 kg)  12/02/11 103 lb (46.72 kg)    Usual Body Weight: 92 lbs per pt  % Usual Body Weight: 92%  BMI:  Body mass index is 16.89 kg/(m^2). Underweight  Estimated Nutritional Needs: Kcal: 1200-1400 Protein: 60-75g Fluid: 1.2-1.4L/day  Skin: LLQ colostomy   Diet Order: Clear Liquid  EDUCATION NEEDS: -No education needs identified at this time   Intake/Output Summary (Last 24 hours) at 06/18/13 1637 Last data filed at 06/18/13 1345  Gross per 24 hour  Intake 2774.58 ml  Output      0 ml  Net 2774.58 ml    Last BM: 4/13, colostomy  Labs:   Recent Labs Lab 06/17/13 1547 06/18/13 0835  NA 127* 130*  K 3.6* 3.4*  CL 87* 97  CO2 22 21  BUN 18 9  CREATININE 0.80 0.62  CALCIUM 8.8 7.5*  MG  --  1.7  GLUCOSE 121* 113*    CBG (last 3)  No results found for this basename: GLUCAP,  in the last 72 hours  Scheduled Meds: . Chlorhexidine Gluconate Cloth  6 each Topical Q0600  . heparin  5,000 Units Subcutaneous 3 times per day  . HYDROmorphone      . methylPREDNISolone (SOLU-MEDROL) injection  40 mg Intravenous Daily  . mupirocin ointment  1 application Nasal BID  .  piperacillin-tazobactam  3.375 g Intravenous Q8H    Continuous Infusions: . dextrose 5 % and 0.9 % NaCl with KCl 20 mEq/L Stopped (06/18/13 1027)    Past Medical History  Diagnosis Date  . Ulcerative colitis, unspecified   . Coronary atherosclerosis of unspecified type of vessel, native or graft   . Anal fistula   . Arthritis   . Personal history of colonic polyps   . Crohn's colitis   . History of colostomy   . Heart murmur     Past Surgical History  Procedure Laterality Date  . Cholecystectomy    . Colon resection      1988 she thinks  . Angioplasty      1987  . Appendectomy    . Tubal ligation    . Colostomy  [address] [place], [place], [place] Pager [phone] After Hours Pager

## 2013-06-18 NOTE — Care Management Note (Addendum)
    Page 1 of 1   06/20/2013     2:19:25 PM   CARE MANAGEMENT NOTE 06/20/2013  Patient:  [name], [name]   Account Number:  [id number]  Date Initiated:  06/18/2013  Documentation initiated by:  Sunday Spillers  Subjective/Objective Assessment:   58 yo female admitted with peristomal abscess. PTA lived home with daughter.     Action/Plan:   Home when stable   Anticipated DC Date:  06/21/2013   Anticipated DC Plan:  [place]  CM consult      [facility] Choice  HOME HEALTH   Choice offered to / List presented to:  C-1 Patient        [place] arranged  HH-1 RN      [place].   Status of service:  Completed, signed off Medicare Important Message given?  NA - LOS <3 / Initial given by admissions (If response is "NO", the following Medicare IM given date fields will be blank) Date Medicare IM given:   Date Additional Medicare IM given:    Discharge Disposition:  [place]  Per UR Regulation:  Reviewed for med. necessity/level of care/duration of stay  If discussed at [place] of Stay Meetings, dates discussed:    Comments:

## 2013-06-18 NOTE — Op Note (Signed)
[name] 1955/07/10 [id number] 06/18/2013  12:38 PM  PATIENT:  [name]  58 y.o. female  PRE-OPERATIVE DIAGNOSIS:  peristomal abcess  POST-OPERATIVE DIAGNOSIS:  peristomal abcess  PROCEDURE:  Procedure(s): Incision and Drainage of large peristomal abscess with scalpel (6 cm)  SURGEON:  Surgeon(s): Gayland Curry, MD  ASSISTANTS: none   ANESTHESIA:   general  DRAINS: none   LOCAL MEDICATIONS USED:  NONE  SPECIMEN:  Source of Specimen:  peristomal abscess   DISPOSITION OF SPECIMEN:  micro  COUNTS:  YES  INDICATION FOR PROCEDURE: 58 year old Caucasian female with history of Crohn's who has a history of a colostomy who was in her usual state of health until 2 weeks ago when she fell down. Since that time she has had progressive swelling and tenderness around her ostomy site. The discomfort in the swelling got so severe it prompted her to come to the emergency room. She was found to have a white count of greater than 25,000. A CT scan demonstrated a large subcutaneous parastomal abscess. She was admitted and placed on IV antibiotics. She was brought to the operating room for incision and drainage. We had discussed the risk and benefits of the procedure. Please see separate notes.  PROCEDURE: After obtaining informed consent the patient was brought to operating room one at [facility] and placed supine on the operating room table and general endotracheal anesthesia was established. The old colostomy bag was removed. The skin surrounding the ostomy was prepped and draped with Betadine. A small occlusive dressing was placed over the actual stoma site. She had a very large subcutaneous bulge extending laterally underneath the skin from her ostomy. It was the size of a large softball. A surgical timeout was performed. She was on therapeutic antibiotics. A curvilinear 6 cm incision was made with a 15 blade just outside the area where the ostomy appliance would fit. The  subcutaneous tissue was divided with electrocautery. I opened up the abscess cavity with a hemostat. There was a large rush of air as well as a large amount of feculent fluid. It was green and yellowish. Aerobic and anaerobic cultures were obtained. The rest of the wound was opened. The abscess cavity extended down to her rectus muscles. It extended out laterally as well. The end colostomy was visible underneath the skin. It appeared to be intact. There is no evidence of connection to the colon. The abscess cavity was irrigated copiously with 2 L of saline. A moistened Kerlix was placed into the abscess cavity. I then attached a new ostomy appliance. 4 x 4's were placed on top of the abscess cavity followed by paper tape. All needle, instrument, and sponge counts were correct x2. There appear to be no complications. The patient was extubated and brought to the recovery room in stable condition  PLAN OF CARE: Admit to inpatient   PATIENT DISPOSITION:  PACU - hemodynamically stable.   Delay start of Pharmacological VTE agent (>24hrs) due to surgical blood loss or risk of bleeding:  no  Leighton Ruff. Redmond Pulling, MD, FACS General, Bariatric, & Minimally Invasive Surgery [facility], [place]

## 2013-06-18 NOTE — Telephone Encounter (Signed)
Relevant patient education mailed to patient.  

## 2013-06-18 NOTE — Progress Notes (Signed)
Subjective: Doing well. Wants to know when surgery will be.  Objective: Vital signs in last 24 hours: Temp:  [98.8 F (37.1 C)-102.2 F (39 C)] 98.8 F (37.1 C) (04/14 0530) Pulse Rate:  [85-131] 85 (04/14 0530) Resp:  [16-28] 16 (04/14 0530) BP: (88-113)/(58-68) 97/58 mmHg (04/14 0530) SpO2:  [93 %-98 %] 95 % (04/14 0530) Weight:  [85 lb (38.556 kg)-85 lb 0.6 oz (38.574 kg)] 85 lb (38.556 kg) (04/13 2154) Last BM Date: 06/17/13  Intake/Output from previous day: 04/13 0701 - 04/14 0700 In: 1674.6 [I.V.:1624.6; IV Piggyback:50] Out: 0  Intake/Output this shift:    Alert, nad, older than stated age cta b/l Reg Soft, nd, ostomy LLQ ; to left of ostomy and around it - large subcu bulge. Min cellulitis. +TTP  Lab Results:   Recent Labs  06/17/13 1547  WBC 29.3*  HGB 11.2*  HCT 32.7*  PLT 586*   BMET  Recent Labs  06/17/13 1547  NA 127*  K 3.6*  CL 87*  CO2 22  GLUCOSE 121*  BUN 18  CREATININE 0.80  CALCIUM 8.8   PT/INR No results found for this basename: LABPROT, INR,  in the last 72 hours ABG No results found for this basename: PHART, PCO2, PO2, HCO3,  in the last 72 hours  Studies/Results: Ct Abdomen Pelvis W Contrast  06/17/2013   CLINICAL DATA:  Mass under of colostomy. History of Crohn's disease.  EXAM: CT ABDOMEN AND PELVIS WITH CONTRAST  TECHNIQUE: Multidetector CT imaging of the abdomen and pelvis was performed using the standard protocol following bolus administration of intravenous contrast.  CONTRAST:  1mL OMNIPAQUE IOHEXOL 300 MG/ML SOLN, 158mL OMNIPAQUE IOHEXOL 300 MG/ML SOLN  COMPARISON:  CT ABD/PELVIS W CM dated 04/12/2011  FINDINGS: Lung bases are clear.  No pericardial fluid.  No focal hepatic lesion. Patient status post cholecystectomy. The pancreas, spleen, adrenal glands, and kidneys are normal.  The stomach and small bowel are normal. Terminal ileum is grossly normal. There is retention of stool within the cecum and large volume stool in  the transverse colon which is mildly dilated to 5.5 cm. There is a left lower quadrant colostomy. There is a new gas and air collection within the left abdominal wall adjacent to the colostomy tract. This has a thickened enhancing rim consistent with a peristomal abscess. This abscess measures 7.3 x 7.9 x 9.1 cm. The abscess is complex and extends into the muscular fascial planes and into the pre peritoneal space. There is no clear communication with the bowel.  The excluded sigmoid colon rectum are normal.  Abdominal aorta is normal caliber. No retroperitoneal periportal lymphadenopathy. . No free fluid the pelvis. The bladder and uterus are normal. No aggressive osseous lesion.  IMPRESSION: 1. New large gas and fluid collection in the left abdominal wall adjacent to the colectomy tract is consistent with a large peristomal abscess. This abscess is complex and involves the muscular fascial planes and extends to the preperitoneal space. 2. No evidence fistulous connection with the bowel. 3. Peristomal abscess partially obstructs the colostomy tract by mass effect. There is significant stool retained within the transverse colon and cecum. No evidence of high-grade bowel obstruction. Findings conveyed toMELANIE BELFI on 06/17/2013  at18:19.   Electronically Signed   By: Suzy Bouchard M.D.   On: 06/17/2013 18:26    Anti-infectives: Anti-infectives   Start     Dose/Rate Route Frequency Ordered Stop   06/18/13 0200  piperacillin-tazobactam (ZOSYN) IVPB 3.375 g  3.375 g 12.5 mL/hr over 240 Minutes Intravenous Every 8 hours 06/17/13 2246     06/17/13 1845  piperacillin-tazobactam (ZOSYN) IVPB 3.375 g     3.375 g 100 mL/hr over 30 Minutes Intravenous  Once 06/17/13 1834 06/17/13 1909   06/17/13 1845  vancomycin (VANCOCIN) IVPB 1000 mg/200 mL premix     1,000 mg 200 mL/hr over 60 Minutes Intravenous  Once 06/17/13 1834 06/17/13 2052      Assessment/Plan: Crohns Large parastomal abscess Electrolyte  abnormalities  Repeat bmet this am To OR later this am for I& D parastomal hernia.  Discussed risks/benefits of surgery - bleeding, infection, scarring, need to leave wound open, delayed healing (smoking/prednisone), hernia, need for addl procedures. Discussed typical aftercare. Pt in agreement for surgery Cont IV abx  Leighton Ruff. Redmond Pulling, MD, FACS General, Bariatric, & Minimally Invasive Surgery [facility], [place]   LOS: 1 day    Gayland Curry 06/18/2013

## 2013-06-18 NOTE — Progress Notes (Signed)
Dr Johney Maine made aware via [place] nurse that patient has persistently low blood pressure the last 3 hours of 88/50 with low output.  Orders received to bolus patient

## 2013-06-18 NOTE — Anesthesia Postprocedure Evaluation (Signed)
  Anesthesia Post-op Note  Patient: [name]  Procedure(s) Performed: Procedure(s) (LRB): Irrigation and Debridiment of peristomal abcess (N/A)  Patient Location: PACU  Anesthesia Type: General  Level of Consciousness: awake and alert   Airway and Oxygen Therapy: Patient Spontanous Breathing  Post-op Pain: mild  Post-op Assessment: Post-op Vital signs reviewed, Patient's Cardiovascular Status Stable, Respiratory Function Stable, Patent Airway and No signs of Nausea or vomiting  Last Vitals:  Filed Vitals:   06/18/13 1345  BP: 134/92  Pulse: 82  Temp:   Resp: 21    Post-op Vital Signs: stable   Complications: No apparent anesthesia complications

## 2013-06-18 NOTE — Progress Notes (Signed)
Patient had fever of 102.2, called MD on call. Order for Tylenol given and order for blood cultures placed. Will continue to monitor patient.   [name]

## 2013-06-18 NOTE — Progress Notes (Signed)
Dr Johney Maine made aware patient still with pressure of 88/46 urinary output of 150 since LR bolus.  Ordered to give another bolus and observe overnight.

## 2013-06-18 NOTE — Anesthesia Preprocedure Evaluation (Signed)
Anesthesia Evaluation  Patient identified by MRN, date of birth, ID band Patient awake  General Assessment Comment:Ulcerative colitis, unspecified  Reviewed: Allergy & Precautions, H&P , NPO status , Patient's Chart, lab work & pertinent test results  Airway Mallampati: II TM Distance: >3 FB Neck ROM: Full    Dental no notable dental hx.    Pulmonary Current Smoker,  breath sounds clear to auscultation  Pulmonary exam normal       Cardiovascular + CAD Rhythm:Regular Rate:Normal     Neuro/Psych negative neurological ROS  negative psych ROS   GI/Hepatic negative GI ROS, Neg liver ROS,   Endo/Other  negative endocrine ROS  Renal/GU negative Renal ROS  negative genitourinary   Musculoskeletal  (+) Rheumatoid disorders and on steriods ,    Abdominal   Peds negative pediatric ROS (+)  Hematology  (+) anemia ,   Anesthesia Other Findings   Reproductive/Obstetrics negative OB ROS                           Anesthesia Physical Anesthesia Plan  ASA: III  Anesthesia Plan: General   Post-op Pain Management:    Induction: Intravenous and Rapid sequence  Airway Management Planned: Oral ETT  Additional Equipment:   Intra-op Plan:   Post-operative Plan: Extubation in OR  Informed Consent: I have reviewed the patients History and Physical, chart, labs and discussed the procedure including the risks, benefits and alternatives for the proposed anesthesia with the patient or authorized representative who has indicated his/her understanding and acceptance.   Dental advisory given  Plan Discussed with: CRNA and Surgeon  Anesthesia Plan Comments:         Anesthesia Quick Evaluation

## 2013-06-18 NOTE — H&P (View-Only) (Signed)
Subjective: Doing well. Wants to know when surgery will be.  Objective: Vital signs in last 24 hours: Temp:  [98.8 F (37.1 C)-102.2 F (39 C)] 98.8 F (37.1 C) (04/14 0530) Pulse Rate:  [85-131] 85 (04/14 0530) Resp:  [16-28] 16 (04/14 0530) BP: (88-113)/(58-68) 97/58 mmHg (04/14 0530) SpO2:  [93 %-98 %] 95 % (04/14 0530) Weight:  [85 lb (38.556 kg)-85 lb 0.6 oz (38.574 kg)] 85 lb (38.556 kg) (04/13 2154) Last BM Date: 06/17/13  Intake/Output from previous day: 04/13 0701 - 04/14 0700 In: 1674.6 [I.V.:1624.6; IV Piggyback:50] Out: 0  Intake/Output this shift:    Alert, nad, older than stated age cta b/l Reg Soft, nd, ostomy LLQ ; to left of ostomy and around it - large subcu bulge. Min cellulitis. +TTP  Lab Results:   Recent Labs  06/17/13 1547  WBC 29.3*  HGB 11.2*  HCT 32.7*  PLT 586*   BMET  Recent Labs  06/17/13 1547  NA 127*  K 3.6*  CL 87*  CO2 22  GLUCOSE 121*  BUN 18  CREATININE 0.80  CALCIUM 8.8   PT/INR No results found for this basename: LABPROT, INR,  in the last 72 hours ABG No results found for this basename: PHART, PCO2, PO2, HCO3,  in the last 72 hours  Studies/Results: Ct Abdomen Pelvis W Contrast  06/17/2013   CLINICAL DATA:  Mass under of colostomy. History of Crohn's disease.  EXAM: CT ABDOMEN AND PELVIS WITH CONTRAST  TECHNIQUE: Multidetector CT imaging of the abdomen and pelvis was performed using the standard protocol following bolus administration of intravenous contrast.  CONTRAST:  1mL OMNIPAQUE IOHEXOL 300 MG/ML SOLN, 158mL OMNIPAQUE IOHEXOL 300 MG/ML SOLN  COMPARISON:  CT ABD/PELVIS W CM dated 04/12/2011  FINDINGS: Lung bases are clear.  No pericardial fluid.  No focal hepatic lesion. Patient status post cholecystectomy. The pancreas, spleen, adrenal glands, and kidneys are normal.  The stomach and small bowel are normal. Terminal ileum is grossly normal. There is retention of stool within the cecum and large volume stool in  the transverse colon which is mildly dilated to 5.5 cm. There is a left lower quadrant colostomy. There is a new gas and air collection within the left abdominal wall adjacent to the colostomy tract. This has a thickened enhancing rim consistent with a peristomal abscess. This abscess measures 7.3 x 7.9 x 9.1 cm. The abscess is complex and extends into the muscular fascial planes and into the pre peritoneal space. There is no clear communication with the bowel.  The excluded sigmoid colon rectum are normal.  Abdominal aorta is normal caliber. No retroperitoneal periportal lymphadenopathy. . No free fluid the pelvis. The bladder and uterus are normal. No aggressive osseous lesion.  IMPRESSION: 1. New large gas and fluid collection in the left abdominal wall adjacent to the colectomy tract is consistent with a large peristomal abscess. This abscess is complex and involves the muscular fascial planes and extends to the preperitoneal space. 2. No evidence fistulous connection with the bowel. 3. Peristomal abscess partially obstructs the colostomy tract by mass effect. There is significant stool retained within the transverse colon and cecum. No evidence of high-grade bowel obstruction. Findings conveyed toMELANIE BELFI on 06/17/2013  at18:19.   Electronically Signed   By: Suzy Bouchard M.D.   On: 06/17/2013 18:26    Anti-infectives: Anti-infectives   Start     Dose/Rate Route Frequency Ordered Stop   06/18/13 0200  piperacillin-tazobactam (ZOSYN) IVPB 3.375 g  3.375 g 12.5 mL/hr over 240 Minutes Intravenous Every 8 hours 06/17/13 2246     06/17/13 1845  piperacillin-tazobactam (ZOSYN) IVPB 3.375 g     3.375 g 100 mL/hr over 30 Minutes Intravenous  Once 06/17/13 1834 06/17/13 1909   06/17/13 1845  vancomycin (VANCOCIN) IVPB 1000 mg/200 mL premix     1,000 mg 200 mL/hr over 60 Minutes Intravenous  Once 06/17/13 1834 06/17/13 2052      Assessment/Plan: Crohns Large parastomal abscess Electrolyte  abnormalities  Repeat bmet this am To OR later this am for I& D parastomal hernia.  Discussed risks/benefits of surgery - bleeding, infection, scarring, need to leave wound open, delayed healing (smoking/prednisone), hernia, need for addl procedures. Discussed typical aftercare. Pt in agreement for surgery Cont IV abx  Leighton Ruff. Redmond Pulling, MD, FACS General, Bariatric, & Minimally Invasive Surgery [facility], [place]   LOS: 1 day    Gayland Curry 06/18/2013

## 2013-06-18 NOTE — Transfer of Care (Signed)
Immediate Anesthesia Transfer of Care Note  Patient: [name]  Procedure(s) Performed: Procedure(s): Irrigation and Debridiment of peristomal abcess (N/A)  Patient Location: PACU  Anesthesia Type:General  Level of Consciousness: awake and alert   Airway & Oxygen Therapy: Patient Spontanous Breathing and Patient connected to face mask oxygen  Post-op Assessment: Report given to PACU RN and Post -op Vital signs reviewed and stable  Post vital signs: Reviewed and stable  Complications: No apparent anesthesia complications

## 2013-06-19 ENCOUNTER — Encounter (HOSPITAL_COMMUNITY): Payer: Self-pay | Admitting: General Surgery

## 2013-06-19 LAB — BASIC METABOLIC PANEL
BUN: 10 mg/dL (ref 6–23)
CHLORIDE: 102 meq/L (ref 96–112)
CO2: 22 mEq/L (ref 19–32)
CREATININE: 0.55 mg/dL (ref 0.50–1.10)
Calcium: 7.5 mg/dL — ABNORMAL LOW (ref 8.4–10.5)
GFR calc Af Amer: >90 mL/min (ref >90)
GFR calc non Af Amer: >90 mL/min (ref >90)
GLUCOSE: 227 mg/dL — AB (ref 70–99)
POTASSIUM: 3.5 meq/L — AB (ref 3.7–5.3)
Sodium: 134 mEq/L — ABNORMAL LOW (ref 137–147)

## 2013-06-19 LAB — CBC
HCT: 23.5 % — ABNORMAL LOW (ref 36.0–46.0)
HEMOGLOBIN: 8 g/dL — AB (ref 12.0–15.0)
MCH: 28.3 pg (ref 26.0–34.0)
MCHC: 34 g/dL (ref 30.0–36.0)
MCV: 83 fL (ref 78.0–100.0)
Platelets: 434 10*3/uL — ABNORMAL HIGH (ref 150–400)
RBC: 2.83 MIL/uL — AB (ref 3.87–5.11)
RDW: 13.5 % (ref 11.5–15.5)
WBC: 18.7 10*3/uL — ABNORMAL HIGH (ref 4.0–10.5)

## 2013-06-19 LAB — MAGNESIUM: Magnesium: 1.7 mg/dL (ref 1.5–2.5)

## 2013-06-19 MED ORDER — OXYCODONE-ACETAMINOPHEN 5-325 MG PO TABS
1.0000 | ORAL_TABLET | ORAL | Status: DC | PRN
  Administered 2013-06-19 (×2): 1 via ORAL
  Administered 2013-06-20 (×3): 2 via ORAL
  Filled 2013-06-19 (×2): qty 2
  Filled 2013-06-19: qty 1
  Filled 2013-06-19: qty 2
  Filled 2013-06-19: qty 1

## 2013-06-19 MED ORDER — HYDROMORPHONE HCL PF 1 MG/ML IJ SOLN
0.5000 mg | INTRAMUSCULAR | Status: DC | PRN
  Administered 2013-06-19 – 2013-06-21 (×7): 1 mg via INTRAVENOUS
  Filled 2013-06-19 (×7): qty 1

## 2013-06-19 MED ORDER — POTASSIUM CHLORIDE CRYS ER 20 MEQ PO TBCR
20.0000 meq | EXTENDED_RELEASE_TABLET | Freq: Every day | ORAL | Status: DC
  Administered 2013-06-19 – 2013-06-21 (×3): 20 meq via ORAL
  Filled 2013-06-19 (×4): qty 1

## 2013-06-19 NOTE — Consult Note (Signed)
WOC wound consult note Reason for Consult:Request from CCS to perform dressing change for peristomal abscess evacuation site.  Seen with bedside RN and Modena Jansky, [place]. Wound type:Surgical Pressure Ulcer POA: Yes/No Measurement:5cm x 4cm x 3.5cm with undermining from 6-11 o'clock measuring from 4.5cm (at 7 o'clock) to 3cm from 9-11 o'clock. Wound GYJ:EHUDJ, red, moist Drainage (amount, consistency, odor) purulent with moderate amount of medium yellow exudate with some dark brown-tinged material.  This has no odor and does not appear to be feculent. Periwound:intact, clear Dressing procedure/placement/frequency:TID NS moistened Kerlix is ordered by Dr., Redmond Pulling and I support the frequency and mode of moisture retentive dressing.  We will hopefully wick the purulent material from this defect and allow for contraction and granulation.  Bedside RN to perform TID changes.  WOC RN will provide patient with her pouches of choice. [place] nursing team will not follow routinely, but will remain available to this patient, the nursing, srugical and medical teams.  Please re-consult if needed. Thanks, Maudie Flakes, MSN, RN, [place], [place], [place] [phone])

## 2013-06-19 NOTE — Clinical Documentation Improvement (Signed)
06/19/13 2 Queries: #1 Possible Clinical Conditions?   Expected Acute Blood Loss Anemia  Acute Blood Loss Anemia  Acute on chronic blood loss anemia  Chronic blood loss anemia  Precipitous drop in Hematocrit  Other Condition  Cannot Clinically Determine  Supporting Information: Diagnostics: Pre-OP Hgb: 11.2 Post OP H&H: 8.0  #2 Possible Clinical conditions: Underweight w/BMI Morbid Obesity W/ BMI Other condition Cannot clinically determine   Supporting Information: Risk Factors: "Hx of Crohn's disease of the colon, terminal ileum and rectum since 1986." "poor appetite for the past week" Sign & Symptoms:BMI=16.89 kg/(m^2). Diagnostics: INITIAL NUTRITION ASSESSMENT by Christie Beckers, RD at 07-01-13  DOCUMENTATION CODES =Per approved criteria -Underweight   Thank You, Alessandra Grout, RN, BSN, CCDS, Clinical Documentation Specialist:  [phone]   [phone]=[facility]- Health Information Management

## 2013-06-19 NOTE — Progress Notes (Signed)
1 Day Post-Op  Subjective: Pt doing well.  Had dressing changed early this am.  No N/V hungry.  Hasn't been OOB yet.  Low urine output overnight was given 2L bolus and now BP is better.  Packing change was done this am due to significant leakage of feculent foul smelling drainage.  Objective: Vital signs in last 24 hours: Temp:  [97.2 F (36.2 C)-98.8 F (37.1 C)] 98 F (36.7 C) (04/15 0607) Pulse Rate:  [67-86] 79 (04/15 0607) Resp:  [14-26] 18 (04/15 0607) BP: (84-134)/(43-92) 115/66 mmHg (04/15 0607) SpO2:  [93 %-100 %] 95 % (04/15 0607) Last BM Date: 06/18/13  Intake/Output from previous day: 04/14 0701 - 04/15 0700 In: 2940 [P.O.:240; I.V.:2600; IV Piggyback:100] Out: 876 [Urine:550; Stool:326] Intake/Output this shift:    PE: Gen:  Alert, NAD, pleasant Abd: Soft, moderate tenderness, ND, +BS, no HSM, packing pulled back so I could only see surface which appears clean and without purulent drainage. Will eval wound further at next dressing change.  Lab Results:   Recent Labs  06/17/13 1547 06/19/13 0503  WBC 29.3* 18.7*  HGB 11.2* 8.0*  HCT 32.7* 23.5*  PLT 586* 434*   BMET  Recent Labs  06/18/13 0835 06/19/13 0503  NA 130* 134*  K 3.4* 3.5*  CL 97 102  CO2 21 22  GLUCOSE 113* 227*  BUN 9 10  CREATININE 0.62 0.55  CALCIUM 7.5* 7.5*   PT/INR No results found for this basename: LABPROT, INR,  in the last 72 hours CMP     Component Value Date/Time   NA 134* 06/19/2013 0503   K 3.5* 06/19/2013 0503   CL 102 06/19/2013 0503   CO2 22 06/19/2013 0503   GLUCOSE 227* 06/19/2013 0503   BUN 10 06/19/2013 0503   CREATININE 0.55 06/19/2013 0503   CALCIUM 7.5* 06/19/2013 0503   PROT 8.0 06/17/2013 1547   ALBUMIN 2.5* 06/17/2013 1547   AST 23 06/17/2013 1547   ALT 8 06/17/2013 1547   ALKPHOS 112 06/17/2013 1547   BILITOT 0.5 06/17/2013 1547   GFRNONAA >90 06/19/2013 0503   GFRAA >90 06/19/2013 0503   Lipase  No results found for this basename: lipase        Studies/Results: Dg Chest 2 View  06/18/2013   CLINICAL DATA:  Mid abdominal pain. History of abscess. No chest complaints. Preop.  EXAM: CHEST  2 VIEW  COMPARISON:  DG CHEST 2 VIEW dated 10/10/2008  FINDINGS: A minimal pectus excavatum deformity. Surgical clips in the right upper quadrant. Midline trachea. Normal heart size and mediastinal contours. No pleural effusion or pneumothorax. Mild interstitial thickening. Slight lower lobe predominant. No lobar consolidation.  IMPRESSION: 1.  No acute cardiopulmonary disease. 2. Mild peribronchial thickening which may relate to chronic bronchitis or smoking.   Electronically Signed   By: Abigail Miyamoto M.D.   On: 06/18/2013 08:16   Ct Abdomen Pelvis W Contrast  06/17/2013   CLINICAL DATA:  Mass under of colostomy. History of Crohn's disease.  EXAM: CT ABDOMEN AND PELVIS WITH CONTRAST  TECHNIQUE: Multidetector CT imaging of the abdomen and pelvis was performed using the standard protocol following bolus administration of intravenous contrast.  CONTRAST:  27mL OMNIPAQUE IOHEXOL 300 MG/ML SOLN, 178mL OMNIPAQUE IOHEXOL 300 MG/ML SOLN  COMPARISON:  CT ABD/PELVIS W CM dated 04/12/2011  FINDINGS: Lung bases are clear.  No pericardial fluid.  No focal hepatic lesion. Patient status post cholecystectomy. The pancreas, spleen, adrenal glands, and kidneys are normal.  The stomach  and small bowel are normal. Terminal ileum is grossly normal. There is retention of stool within the cecum and large volume stool in the transverse colon which is mildly dilated to 5.5 cm. There is a left lower quadrant colostomy. There is a new gas and air collection within the left abdominal wall adjacent to the colostomy tract. This has a thickened enhancing rim consistent with a peristomal abscess. This abscess measures 7.3 x 7.9 x 9.1 cm. The abscess is complex and extends into the muscular fascial planes and into the pre peritoneal space. There is no clear communication with the bowel.  The  excluded sigmoid colon rectum are normal.  Abdominal aorta is normal caliber. No retroperitoneal periportal lymphadenopathy. . No free fluid the pelvis. The bladder and uterus are normal. No aggressive osseous lesion.  IMPRESSION: 1. New large gas and fluid collection in the left abdominal wall adjacent to the colectomy tract is consistent with a large peristomal abscess. This abscess is complex and involves the muscular fascial planes and extends to the preperitoneal space. 2. No evidence fistulous connection with the bowel. 3. Peristomal abscess partially obstructs the colostomy tract by mass effect. There is significant stool retained within the transverse colon and cecum. No evidence of high-grade bowel obstruction. Findings conveyed toMELANIE BELFI on 06/17/2013  at18:19.   Electronically Signed   By: Suzy Bouchard M.D.   On: 06/17/2013 18:26    Anti-infectives: Anti-infectives   Start     Dose/Rate Route Frequency Ordered Stop   06/18/13 0200  piperacillin-tazobactam (ZOSYN) IVPB 3.375 g     3.375 g 12.5 mL/hr over 240 Minutes Intravenous Every 8 hours 06/17/13 2246     06/17/13 1845  piperacillin-tazobactam (ZOSYN) IVPB 3.375 g     3.375 g 100 mL/hr over 30 Minutes Intravenous  Once 06/17/13 1834 06/17/13 1909   06/17/13 1845  vancomycin (VANCOCIN) IVPB 1000 mg/200 mL premix     1,000 mg 200 mL/hr over 60 Minutes Intravenous  Once 06/17/13 1834 06/17/13 2052       Assessment/Plan Subcutaneous peristomal abscess POD #1 s/p I&D ABL Anemia - may be dilutional  Crohns H/o colostomy Leukocytosis - improving to 18.7 Hypotension - improved this am Hypokalemia - 3.5   Plan: 1.  Continue pain medications and IV antibiotics 2.  Continue TID dressing changes with loose packing to avoid injury to the surrounding colon brought up to the skin for the colostomy.  [place] nurse consulted to see on any recommendations given close proximity to colostomy. 3.  Ambulate and IS 4.  SCD's and  heparin 5.  Continue to monitor H/H if continue to fall may need to hold lovenox, repeat CBC in am 6.  Supplemented K (already in IVF, added 94meq Kdur)    LOS: 2 days    Coralie Keens 06/19/2013, 7:52 AM Pager: [phone]

## 2013-06-19 NOTE — Telephone Encounter (Signed)
Patient calling to let Dr. Olevia Perches know she is admitted [place] because she had surgery. (peristomal abcess)

## 2013-06-19 NOTE — Progress Notes (Signed)
Pt had minimal bleeding in OR. Expect pt was very hemoconcentrated on admission.  If ongoing large amount of feculent drainage will need to be reimaged to r/o intestinal source  Leighton Ruff. Redmond Pulling, MD, FACS General, Bariatric, & Minimally Invasive Surgery [facility], [place]

## 2013-06-20 LAB — CBC
HEMATOCRIT: 26.2 % — AB (ref 36.0–46.0)
Hemoglobin: 8.6 g/dL — ABNORMAL LOW (ref 12.0–15.0)
MCH: 27.8 pg (ref 26.0–34.0)
MCHC: 32.8 g/dL (ref 30.0–36.0)
MCV: 84.8 fL (ref 78.0–100.0)
Platelets: ADEQUATE 10*3/uL (ref 150–400)
RBC: 3.09 MIL/uL — ABNORMAL LOW (ref 3.87–5.11)
RDW: 14 % (ref 11.5–15.5)
WBC: 17.3 10*3/uL — ABNORMAL HIGH (ref 4.0–10.5)

## 2013-06-20 MED ORDER — OXYCODONE HCL 5 MG PO TABS
10.0000 mg | ORAL_TABLET | ORAL | Status: DC | PRN
  Administered 2013-06-20 – 2013-06-21 (×5): 15 mg via ORAL
  Filled 2013-06-20 (×5): qty 3

## 2013-06-20 NOTE — Progress Notes (Signed)
Patient ID: [name], female   DOB: [birth year], 58 y.o.   MRN: [id number] 2 Days Post-Op  Subjective: Pt feels ok today.  Eating well.  Pain at wound site.  Objective: Vital signs in last 24 hours: Temp:  [97.1 F (36.2 C)-97.6 F (36.4 C)] 97.6 F (36.4 C) (04/16 0542) Pulse Rate:  [57-75] 61 (04/16 0542) Resp:  [16-18] 16 (04/16 0542) BP: (115-124)/(62-70) 124/70 mmHg (04/16 0542) SpO2:  [95 %-97 %] 97 % (04/16 0542) Last BM Date: 06/19/13  Intake/Output from previous day: 04/15 0701 - 04/16 0700 In: 3775 [P.O.:600; I.V.:3125; IV Piggyback:50] Out: 50 [Urine:750; Stool:300] Intake/Output this shift:    PE: Abd: soft, NT except around wound, +BS, ostomy with air present, wound is mostly clean, there was some brown purulent drainage present today.  It had no odor and did not appear to be feculent. Some faint erythema noted over mons pubis today  Lab Results:   Recent Labs  06/19/13 0503 06/20/13 0434  WBC 18.7* 17.3*  HGB 8.0* 8.6*  HCT 23.5* 26.2*  PLT 434* PLATELET CLUMPS NOTED ON SMEAR, COUNT APPEARS ADEQUATE   BMET  Recent Labs  06/18/13 0835 06/19/13 0503  NA 130* 134*  K 3.4* 3.5*  CL 97 102  CO2 21 22  GLUCOSE 113* 227*  BUN 9 10  CREATININE 0.62 0.55  CALCIUM 7.5* 7.5*   PT/INR No results found for this basename: LABPROT, INR,  in the last 72 hours CMP     Component Value Date/Time   NA 134* 06/19/2013 0503   K 3.5* 06/19/2013 0503   CL 102 06/19/2013 0503   CO2 22 06/19/2013 0503   GLUCOSE 227* 06/19/2013 0503   BUN 10 06/19/2013 0503   CREATININE 0.55 06/19/2013 0503   CALCIUM 7.5* 06/19/2013 0503   PROT 8.0 06/17/2013 1547   ALBUMIN 2.5* 06/17/2013 1547   AST 23 06/17/2013 1547   ALT 8 06/17/2013 1547   ALKPHOS 112 06/17/2013 1547   BILITOT 0.5 06/17/2013 1547   GFRNONAA >90 06/19/2013 0503   GFRAA >90 06/19/2013 0503   Lipase  No results found for this basename: lipase       Studies/Results: No results  found.  Anti-infectives: Anti-infectives   Start     Dose/Rate Route Frequency Ordered Stop   06/18/13 0200  piperacillin-tazobactam (ZOSYN) IVPB 3.375 g     3.375 g 12.5 mL/hr over 240 Minutes Intravenous Every 8 hours 06/17/13 2246     06/17/13 1845  piperacillin-tazobactam (ZOSYN) IVPB 3.375 g     3.375 g 100 mL/hr over 30 Minutes Intravenous  Once 06/17/13 1834 06/17/13 1909   06/17/13 1845  vancomycin (VANCOCIN) IVPB 1000 mg/200 mL premix     1,000 mg 200 mL/hr over 60 Minutes Intravenous  Once 06/17/13 1834 06/17/13 2052       Assessment/Plan Subcutaneous peristomal abscess POD #2 s/p I&D  ABL Anemia - may be dilutional  Crohns  H/o colostomy  Leukocytosis - improving to 17K Hypokalemia   Plan: 1. Saline lock IV 2. Cont IV zosyn 3. Will follow faint erythema over mons pubis.  No sure it means much, but will follow 4. Cont regular diet 5. Cont TID dressing changes for now. 6. Adjust oral pain meds.  If WBC goes down hopefully home in the next 1-2 days with [facility].     LOS: 3 days    [name] 06/20/2013, 9:45 AM Pager: [phone]

## 2013-06-21 LAB — CBC
HCT: 24.4 % — ABNORMAL LOW (ref 36.0–46.0)
Hemoglobin: 8.1 g/dL — ABNORMAL LOW (ref 12.0–15.0)
MCH: 28.2 pg (ref 26.0–34.0)
MCHC: 33.2 g/dL (ref 30.0–36.0)
MCV: 85 fL (ref 78.0–100.0)
PLATELETS: 537 10*3/uL — AB (ref 150–400)
RBC: 2.87 MIL/uL — ABNORMAL LOW (ref 3.87–5.11)
RDW: 14 % (ref 11.5–15.5)
WBC: 11.8 10*3/uL — ABNORMAL HIGH (ref 4.0–10.5)

## 2013-06-21 LAB — CULTURE, ROUTINE-ABSCESS

## 2013-06-21 MED ORDER — OXYCODONE HCL 10 MG PO TABS: 10.0000 mg | ORAL_TABLET | ORAL | Status: DC | PRN

## 2013-06-21 MED ORDER — AMOXICILLIN-POT CLAVULANATE 875-125 MG PO TABS: 1.0000 | ORAL_TABLET | Freq: Two times a day (BID) | ORAL | Status: DC

## 2013-06-21 NOTE — Progress Notes (Signed)
Pt seen last night. Eating well Doing well - no complaints  Wbc going down Wound stable  cx - GNR  Probably home Friday with [facility]  Will need to finish abx as outpt F/u cx  Leighton Ruff. Redmond Pulling, MD, FACS General, Bariatric, & Minimally Invasive Surgery [facility], [place]

## 2013-06-21 NOTE — Discharge Summary (Signed)
Patient ID: GENTRY SEEBER MRN: [id number] DOB/AGE: Nov 29, 1955 58 y.o.  Admit date: 06/17/2013 Discharge date: 06/21/2013  Procedures: Incision and drainage of large peristomal abscess  Consults: None  Reason for Admission: This is a 58 year old white female with Crohn's disease of the colon, terminal ileum and rectum since 1986. She had a colon stricture resected in 1996. She had a diverting colostomy for intractable rectal disease in 2010 and. She has been on Humira 40 mg weekly. . She is on prednisone 10 mg daily and mesalamine 4.8 g daily. . Her last colonoscopy in March 2011 via colostomy was normal. A sigmoidoscopy from 0-40 cm was also normal.She is followed regularly by Dr. [name]  She states that approximately 4 weeks ago she fell and struck her abdomen near the colostomy site. She states that since that time she has had gradually worsening pain and swelling. At her colostomy site. At some point she had what looked like pus draining from the colostomy but this has stopped for about the past week. Over the past several days the pain and swelling has gotten much worse and she has been unable to get out of bed. She has been weak. She had not noted fever or chills but was febrile when she presented to the emergency department today. She has had no appetite and not much at all to either drink for about a week. Her ostomy continues to function normally. No nausea or vomiting. No blood per stoma.  Admission Diagnoses:  1. Large peristomal abscess of abdominal wall Patient Active Problem List   Diagnosis Date Noted  . Abdominal pain, acute, left lower quadrant 06/17/2013  . Abdominal wall abscess 06/17/2013  . Peristomal abscess, ileostomy 06/17/2013  . CAD (coronary artery disease) of artery bypass graft 05/17/2013  . Osteoporosis, unspecified 05/17/2013  . RECTAL PAIN 12/01/2009  . COLITIS, ULCERATIVE NOS 07/31/2008  . CORONARY ARTERY DISEASE 05/03/2007  . ANAL FISTULA 05/03/2007  .  CROHN'S DISEASE, LARGE INTESTINE 09/26/2005    Hospital Course:  The patient was admitted and placed on IV abx therapy.  She was taken to the OR the following day where she underwent an I&D.  There was no connection to the stoma or bowel noted.  She started with dressing changes the next day.  She had some brown purulent drainage, but this was not felt to be stool as it had essentially no odor.  Her WBC remained stable for the first two post op days around 17-18K. She was continued on her IV abx therapy.  On POD 3, her WBC was 11K.  Her wound was relatively clean with no evidence of feculent drainage.  She was felt stable for dc home on oral abx therapy and wound care.  PE: Abd: soft, NT except around wound, wound is relatively clean, minimal purulent drainage at the base, but no evidence of feculent drainage, +BS, ostomy with good output.  Discharge Diagnoses:  Active Problems:   Abdominal wall abscess   Peristomal abscess, ileostomy s/p I&D of abscess Patient Active Problem List   Diagnosis Date Noted  . Abdominal pain, acute, left lower quadrant 06/17/2013  . Abdominal wall abscess 06/17/2013  . Peristomal abscess, ileostomy 06/17/2013  . CAD (coronary artery disease) of artery bypass graft 05/17/2013  . Osteoporosis, unspecified 05/17/2013  . RECTAL PAIN 12/01/2009  . COLITIS, ULCERATIVE NOS 07/31/2008  . CORONARY ARTERY DISEASE 05/03/2007  . ANAL FISTULA 05/03/2007  . CROHN'S DISEASE, LARGE INTESTINE 09/26/2005     Discharge Medications:  Medication List    STOP taking these medications       HYDROcodone-acetaminophen 5-325 MG per tablet  Commonly known as:  NORCO/VICODIN      TAKE these medications       adalimumab 40 MG/0.8ML injection  Commonly known as:  HUMIRA  Inject 0.8 mLs (40 mg total) into the skin once a week.     alendronate 70 MG tablet  Commonly known as:  FOSAMAX  Take 70 mg by mouth once a week. Take with a full glass of water on an empty stomach.      alendronate 70 MG tablet  Commonly known as:  FOSAMAX  70 mg. Take with a full glass of water on an empty stomach. No food for 30 minutes after taking Takes on Tuesdays     amoxicillin-clavulanate 875-125 MG per tablet  Commonly known as:  AUGMENTIN  Take 1 tablet by mouth 2 (two) times daily.     aspirin 81 MG tablet  Take 81 mg by mouth daily.     Calcium-Vitamin D 600-125 MG-UNIT Tabs  Take 1200mg  with vitamin D daily.     diazepam 5 MG tablet  Commonly known as:  VALIUM  Take 1 tablet (5 mg total) by mouth daily as needed.     dicyclomine 20 MG tablet  Commonly known as:  BENTYL  Take 1 tablet by mouth twice daily as needed for crampy abdominal pain     ergocalciferol 50000 UNITS capsule  Commonly known as:  VITAMIN D2  Take one po weekly x 12 weeks     mesalamine 1.2 G EC tablet  Commonly known as:  LIALDA  Take 4 tablets (4.8 g total) by mouth daily with breakfast.     MULTIVITAMIN PO  Take by mouth. Take 1 tablet by mouth once daily     Oxycodone HCl 10 MG Tabs  Take 1-1.5 tablets (10-15 mg total) by mouth every 4 (four) hours as needed for moderate pain.     predniSONE 5 MG tablet  Commonly known as:  DELTASONE  Take 5 mg by mouth daily with breakfast.        Discharge Instructions:     Follow-up Information   Follow up with Gayland Curry, MD. Schedule an appointment as soon as possible for a visit in 2 weeks.   Specialty:  General Surgery   Contact information:   [address] [place] [address] [phone]       Signed: [name] 06/21/2013, 10:02 AM

## 2013-06-21 NOTE — Discharge Instructions (Signed)
Dressing Change A dressing is a material placed over wounds. It keeps the wound clean, dry, and protected from further injury. This provides an environment that favors wound healing.  BEFORE YOU BEGIN  Get your supplies together. Things you may need include:  Saline solution.  Flexible gauze dressing.  Medicated cream.  Tape.  Gloves.  Abdominal dressing pads.  Gauze squares.  Plastic bags.  Take pain medicine 30 minutes before the dressing change if you need it.  Take a shower before you do the first dressing change of the day. Use plastic wrap or a plastic bag to prevent the dressing from getting wet. REMOVING YOUR OLD DRESSING   Wash your hands with soap and water. Dry your hands with a clean towel.  Put on your gloves.  Remove any tape.  Carefully remove the old dressing. If the dressing sticks, you may dampen it with warm water to loosen it, or follow your caregiver's specific directions.  Remove any gauze or packing tape that is in your wound.  Take off your gloves.  Put the gloves, tape, gauze, or any packing tape into a plastic bag. CHANGING YOUR DRESSING  Open the supplies.  Take the cap off the saline solution.  Open the gauze package so that the gauze remains on the inside of the package.  Put on your gloves.  Clean your wound as told by your caregiver.  If you have been told to keep your wound dry, follow those instructions.  Your caregiver may tell you to do one or more of the following:  Pick up the gauze. Pour the saline solution over the gauze. Squeeze out the extra saline solution.  Put medicated cream or other medicine on your wound if you have been told to do so.  Put the solution soaked gauze only in your wound, not on the skin around it.  Pack your wound loosely or as told by your caregiver.  Put dry gauze on your wound.  Put abdominal dressing pads over the dry gauze if your wet gauze soaks through.  Tape the abdominal dressing  pads in place so they will not fall off. Do not wrap the tape completely around the affected part (arm, leg, abdomen).  Wrap the dressing pads with a flexible gauze dressing to secure it in place.  Take off your gloves. Put them in the plastic bag with the old dressing. Tie the bag shut and throw it away.  Keep the dressing clean and dry until your next dressing change.  Wash your hands. SEEK MEDICAL CARE IF:  Your skin around the wound looks red.  Your wound feels more tender or sore.  You see pus in the wound.  Your wound smells bad.  You have a fever over 101F.  Your skin around the wound has a rash that itches and burns.  You see black or yellow skin in your wound that was not there before.  You feel nauseous, throw up, and feel very tired. Document Released: 03/31/2004 Document Revised: 05/16/2011 Document Reviewed: 01/03/2011 [facility] Patient Information 2014 [place], [place].

## 2013-06-22 NOTE — Discharge Summary (Signed)
Martha Bullard M. Terreon Ekholm, MD, FACS General, Bariatric, & Minimally Invasive Surgery [facility]  

## 2013-06-23 LAB — ANAEROBIC CULTURE

## 2013-06-24 LAB — CULTURE, BLOOD (ROUTINE X 2)
Culture: NO GROWTH
Culture: NO GROWTH

## 2013-06-27 ENCOUNTER — Telehealth (INDEPENDENT_AMBULATORY_CARE_PROVIDER_SITE_OTHER): Payer: Self-pay

## 2013-06-27 NOTE — Telephone Encounter (Signed)
Patient requested Oxycodone refill, DR. Wilson authorized Oxycodone 5mg  one po q4-6 hrs prn pain #40. Patient aware to P/U at front desk today

## 2013-06-28 ENCOUNTER — Telehealth: Payer: Self-pay | Admitting: *Deleted

## 2013-06-28 NOTE — Telephone Encounter (Signed)
I have spoken to patient (we got a call earlier from her insurance company that she needed prior auth for humira). Patient has already been approved through [place] patient assistance to receive her Humira. I confirmed with Estella @ [employer] that patient did not need prior authorization through insurance since the medication will come directly through the manufactuer to the patient. Per Nelida Meuse, patient needs to call [employer] to ask for shipment as they called her on 06/13/13 to schedule shipment but never received a return call. I have advised patient of this and she verbalizes understanding.

## 2013-07-01 ENCOUNTER — Encounter: Payer: Self-pay | Admitting: Cardiology

## 2013-07-03 ENCOUNTER — Ambulatory Visit: Payer: Commercial Managed Care - HMO | Admitting: Cardiology

## 2013-07-04 ENCOUNTER — Telehealth: Payer: Self-pay | Admitting: Internal Medicine

## 2013-07-04 NOTE — Telephone Encounter (Signed)
Noted  

## 2013-07-05 ENCOUNTER — Telehealth: Payer: Self-pay | Admitting: Family

## 2013-07-05 DIAGNOSIS — Z933 Colostomy status: Secondary | ICD-10-CM

## 2013-07-05 NOTE — Addendum Note (Signed)
Addended by: Debbrah Alar on: 07/05/2013 02:50 PM   Modules accepted: Orders

## 2013-07-05 NOTE — Telephone Encounter (Signed)
Needs referral to [facility] Dr Greer Pickerel NPI [id number] DX colostomy follow up effective 07/05/13 [employer]

## 2013-07-08 ENCOUNTER — Telehealth (INDEPENDENT_AMBULATORY_CARE_PROVIDER_SITE_OTHER): Payer: Self-pay | Admitting: General Surgery

## 2013-07-08 NOTE — Telephone Encounter (Signed)
Called patient and told her that she can drive has long she is not taking any pain meds, she stated that when she is taking the pain med is only when she is having her dressing change. I told her to still push the water and keep her apt with Dr Redmond Pulling on 07-12-13

## 2013-07-08 NOTE — Telephone Encounter (Signed)
Message copied by Maryclare Bean on Mon Jul 08, 2013 10:41 AM ------      Message from: Salvatore Marvel      Created: Mon Jul 08, 2013 10:17 AM      Regarding: Dr. Otho Darner      Contact: [phone]       Patient had colostomy on 06/17/13 with Dr. Redmond Pulling and wants to know if she can drive now, pls call her.            Thank you ------

## 2013-07-12 ENCOUNTER — Ambulatory Visit (INDEPENDENT_AMBULATORY_CARE_PROVIDER_SITE_OTHER): Payer: Medicare HMO | Admitting: General Surgery

## 2013-07-12 ENCOUNTER — Encounter (INDEPENDENT_AMBULATORY_CARE_PROVIDER_SITE_OTHER): Payer: Self-pay | Admitting: General Surgery

## 2013-07-12 VITALS — BP 110/60 | HR 80 | Temp 97.5°F | Resp 16 | Ht 59.5 in | Wt 81.4 lb

## 2013-07-12 DIAGNOSIS — Z09 Encounter for follow-up examination after completed treatment for conditions other than malignant neoplasm: Secondary | ICD-10-CM

## 2013-07-12 MED ORDER — OXYCODONE HCL 10 MG PO TABS: 10.0000 mg | ORAL_TABLET | ORAL | Status: DC | PRN

## 2013-07-12 NOTE — Patient Instructions (Signed)
Do wet -dry dressing change at least once a day until home health nurse puts calcium alginate back into wound

## 2013-07-12 NOTE — Progress Notes (Signed)
Subjective:     Patient ID: [name], female   DOB: April 26, 1955, 58 y.o.   MRN: [id number]  HPI 58 year old Caucasian female comes in for followup after undergoing incision and drainage of a large peristomal abscess on April 14. She has a history of a colostomy due to severe Crohn's disease. She was discharged from the hospital on the 17th. She has been getting home health nursing for wound care. Currently the wound is being packed with calcium alginate twice a week. She denies any fevers or chills. She denies any feculent drainage from the wound. She reports a good appetite.  Review of Systems     Objective:   Physical Exam BP 110/60  Pulse 80  Temp(Src) 97.5 F (36.4 C) (Temporal)  Resp 16  Ht 4' 11.5" (1.511 m)  Wt 81 lb 6.4 oz (36.923 kg)  BMI 16.17 kg/m2 White female in no apparent distress, appears older than stated age, cachectic Abdomen-soft, nontender, nondistended. Colostomy in left midabdomen. Open elliptical wound just outside colostomy appliance-length the wound is around 4 cm, width of the wound is 1 cm. It tracks medially for about 2-1/2 cm. The base of the wound is clean with excellent granulation tissue    Assessment:     Status post incision and drainage of large parastomal abscess     Plan:     The wound appears to be healing well. There is no sign of leakage from her colostomy. We will do wet to dry dressings over the weekend. She can resume calcium alginate dressing on Monday. We discussed the importance of trying to stop smoking. Followup with me in 4-6 weeks  Leighton Ruff. Redmond Pulling, MD, FACS General, Bariatric, & Minimally Invasive Surgery [facility], [place]

## 2013-07-19 ENCOUNTER — Other Ambulatory Visit: Payer: Self-pay | Admitting: Internal Medicine

## 2013-07-23 ENCOUNTER — Telehealth: Payer: Self-pay | Admitting: Internal Medicine

## 2013-07-23 NOTE — Telephone Encounter (Signed)
Patient advised that an rx for valium was sent for #30 with 1 refill on 07/19/13. She needs to call pharmacy to ask for refill. I have also advised that it appears Dr Redmond Pulling gave her oxycodone on 07/12/13 #40. Not sure if Dr Olevia Perches would want her to have both. She has not had hydrocodone prescription since 05/2013. Dr Olevia Perches, are you okay with me filling hydrocodone for her?

## 2013-07-23 NOTE — Telephone Encounter (Signed)
Left message to call back  

## 2013-07-23 NOTE — Telephone Encounter (Signed)
Hydrocodone from Dr Redmond Pulling has to last till June1, 2015. We will refill on August 05 2013.

## 2013-07-24 NOTE — Telephone Encounter (Signed)
Left voicemail advising patient of Dr Nichola Sizer recommendation.

## 2013-08-01 ENCOUNTER — Telehealth (INDEPENDENT_AMBULATORY_CARE_PROVIDER_SITE_OTHER): Payer: Self-pay

## 2013-08-01 ENCOUNTER — Other Ambulatory Visit (INDEPENDENT_AMBULATORY_CARE_PROVIDER_SITE_OTHER): Payer: Self-pay | Admitting: General Surgery

## 2013-08-01 MED ORDER — HYDROCODONE-ACETAMINOPHEN 5-325 MG PO TABS: 1.0000 | ORAL_TABLET | Freq: Four times a day (QID) | ORAL | Status: DC | PRN

## 2013-08-01 NOTE — Telephone Encounter (Signed)
Called pt to inform her that Dr Redmond Pulling wrote her a RX for Hydrocodone 5/325mg  1 tab every 6 hours as needed for pain. Informed pt that it would be ready for her to pick up at the front desk. Pt verbalized understanding.

## 2013-08-01 NOTE — Telephone Encounter (Signed)
Pt s/p incision and drainage of a large peristomal abscess on 5/14 due to her Chron's Disease. Pt is calling today to see if Dr Redmond Pulling, wold refill her pain medication. Pt states that the last Rx Dr Redmond Pulling gave her was for Oxycodone 10mg . Pt states that she doesn't feel that she needs anything that strong. Pt states that had been taking Hydrocodone 5/325mg  and that was working for her. Pt rates her pain at this time a 5-6, but when her Chron's flares up she said she would rate her pain a 8-9. Informed pt that I would send Dr Redmond Pulling a message to request the refill and that if he refills the Rx she would need to pick the Rx up on the office. Pt verbalized understanding and agrees with POC.

## 2013-08-20 ENCOUNTER — Telehealth: Payer: Self-pay | Admitting: Internal Medicine

## 2013-08-20 MED ORDER — HYDROCODONE-ACETAMINOPHEN 5-325 MG PO TABS: 1.0000 | ORAL_TABLET | Freq: Four times a day (QID) | ORAL | Status: DC | PRN

## 2013-08-20 NOTE — Telephone Encounter (Signed)
Rx created and awaiting Dr Nichola Sizer signature. Patient advised.

## 2013-08-20 NOTE — Telephone Encounter (Signed)
We can send her #30 Hydrocodone to  Last for 2 weeks till 09/01/2013)  since she received #30 on 08/01/2013

## 2013-08-20 NOTE — Telephone Encounter (Signed)
Patient received #30 hydrocodone from Dr Redmond Pulling on 08/01/13.  She typically receives #60 monthly from [place] (she has not gotten any in a while). Do you want me to refill rx or does she need to contact Dr Redmond Pulling again?

## 2013-08-29 ENCOUNTER — Encounter (INDEPENDENT_AMBULATORY_CARE_PROVIDER_SITE_OTHER): Payer: Self-pay | Admitting: General Surgery

## 2013-08-29 ENCOUNTER — Ambulatory Visit (INDEPENDENT_AMBULATORY_CARE_PROVIDER_SITE_OTHER): Payer: Commercial Managed Care - HMO | Admitting: General Surgery

## 2013-08-29 VITALS — BP 110/60 | HR 89 | Temp 97.1°F | Resp 16 | Ht 59.5 in | Wt 80.4 lb

## 2013-08-29 DIAGNOSIS — Z09 Encounter for follow-up examination after completed treatment for conditions other than malignant neoplasm: Secondary | ICD-10-CM

## 2013-08-29 NOTE — Progress Notes (Signed)
Subjective:     Patient ID: [name], female   DOB: 1956/02/02, 58 y.o.   MRN: [id number]  HPI 58 year old Caucasian female comes in for followup after undergoing incision and drainage of peri-stomal abscess. She states doing well. Home health is to longer seeing her. The wound is closed. She denies any fevers or chills. She reports normal colostomy output. Occasionally she will have some abdominal pain which she attributes to her long-standing Crohn's. She reports an improving appetite.  Review of Systems     Objective:   Physical Exam BP 110/60  Pulse 89  Temp(Src) 97.1 F (36.2 C)  Resp 16  Ht 4' 11.5" (1.511 m)  Wt 80 lb 6.4 oz (36.469 kg)  BMI 15.97 kg/m2 Alert, no apparent distress, underweight female in no apparent distress Abdomen-soft, nontender, nondistended. Colostomy in left mid abdomen. Secondary healed scar lateral to colostomy. No cellulitis, induration or fluctuance. No evidence of parastomal hernia.    Assessment:     Status post incision and drainage of a parastomal abscess     Plan:     Overall I think she is doing well. There is no sign of ongoing infection. There is no sign of hernia. There is no sign of ongoing abscess. We discussed the importance of good nutrition and gaining some weight. She was instructed to followup with her GI physician. Followup as needed  Leighton Ruff. Redmond Pulling, MD, FACS General, Bariatric, & Minimally Invasive Surgery [facility], [place]

## 2013-08-29 NOTE — Patient Instructions (Signed)
Follow-up with Dr [name]

## 2013-09-02 ENCOUNTER — Telehealth: Payer: Self-pay | Admitting: Internal Medicine

## 2013-09-02 MED ORDER — HYDROCODONE-ACETAMINOPHEN 5-325 MG PO TABS: 1.0000 | ORAL_TABLET | Freq: Four times a day (QID) | ORAL | Status: DC | PRN

## 2013-09-02 NOTE — Telephone Encounter (Signed)
Rx created and awaiting Dr Brodie's signature. 

## 2013-09-04 ENCOUNTER — Telehealth: Payer: Self-pay | Admitting: Internal Medicine

## 2013-09-04 MED ORDER — DICYCLOMINE HCL 20 MG PO TABS: ORAL_TABLET | ORAL | Status: DC

## 2013-09-04 NOTE — Telephone Encounter (Signed)
Prescription sent to [facility]. Left a message for patient to return my call only if the prescription was not supposed to go to [employer].

## 2013-09-06 ENCOUNTER — Other Ambulatory Visit: Payer: Self-pay | Admitting: Internal Medicine

## 2013-09-15 ENCOUNTER — Other Ambulatory Visit: Payer: Self-pay | Admitting: Internal Medicine

## 2013-09-16 ENCOUNTER — Telehealth: Payer: Self-pay | Admitting: Internal Medicine

## 2013-09-16 NOTE — Telephone Encounter (Signed)
Left message advising patient that I am unable to refill her medication at this time. Her rx was to last 1 month so she will be due for more medication on 10/01/13. She will call back with questions.

## 2013-09-17 ENCOUNTER — Telehealth: Payer: Self-pay | Admitting: Internal Medicine

## 2013-09-17 MED ORDER — HYDROCODONE-ACETAMINOPHEN 5-325 MG PO TABS: 1.0000 | ORAL_TABLET | Freq: Four times a day (QID) | ORAL | Status: DC | PRN

## 2013-09-17 NOTE — Telephone Encounter (Signed)
Rx for #60 created and awaiting Dr Nichola Sizer signature.

## 2013-09-20 ENCOUNTER — Ambulatory Visit: Payer: Medicare HMO | Admitting: Internal Medicine

## 2013-09-24 ENCOUNTER — Encounter: Payer: Self-pay | Admitting: Internal Medicine

## 2013-09-24 ENCOUNTER — Ambulatory Visit (INDEPENDENT_AMBULATORY_CARE_PROVIDER_SITE_OTHER): Payer: Commercial Managed Care - HMO | Admitting: Internal Medicine

## 2013-09-24 ENCOUNTER — Other Ambulatory Visit (INDEPENDENT_AMBULATORY_CARE_PROVIDER_SITE_OTHER): Payer: Medicare HMO

## 2013-09-24 VITALS — BP 110/60 | HR 64 | Ht 59.5 in | Wt 84.8 lb

## 2013-09-24 DIAGNOSIS — Z79899 Other long term (current) drug therapy: Secondary | ICD-10-CM

## 2013-09-24 DIAGNOSIS — Z933 Colostomy status: Secondary | ICD-10-CM

## 2013-09-24 DIAGNOSIS — K5090 Crohn's disease, unspecified, without complications: Secondary | ICD-10-CM

## 2013-09-24 DIAGNOSIS — K5010 Crohn's disease of large intestine without complications: Secondary | ICD-10-CM

## 2013-09-24 DIAGNOSIS — K50114 Crohn's disease of large intestine with abscess: Secondary | ICD-10-CM

## 2013-09-24 DIAGNOSIS — D849 Immunodeficiency, unspecified: Secondary | ICD-10-CM

## 2013-09-24 LAB — IBC PANEL
Iron: 48 ug/dL (ref 42–145)
SATURATION RATIOS: 12 % — AB (ref 20.0–50.0)
TRANSFERRIN: 284.9 mg/dL (ref 212.0–360.0)

## 2013-09-24 LAB — CBC WITH DIFFERENTIAL/PLATELET
BASOS ABS: 0 10*3/uL (ref 0.0–0.1)
Basophils Relative: 0.2 % (ref 0.0–3.0)
Eosinophils Absolute: 0 10*3/uL (ref 0.0–0.7)
Eosinophils Relative: 0 % (ref 0.0–5.0)
HCT: 40.7 % (ref 36.0–46.0)
Hemoglobin: 13.1 g/dL (ref 12.0–15.0)
LYMPHS PCT: 21.9 % (ref 12.0–46.0)
Lymphs Abs: 2.6 10*3/uL (ref 0.7–4.0)
MCHC: 32.3 g/dL (ref 30.0–36.0)
MCV: 89.4 fl (ref 78.0–100.0)
MONOS PCT: 3.2 % (ref 3.0–12.0)
Monocytes Absolute: 0.4 10*3/uL (ref 0.1–1.0)
Neutro Abs: 9 10*3/uL — ABNORMAL HIGH (ref 1.4–7.7)
Neutrophils Relative %: 74.7 % (ref 43.0–77.0)
PLATELETS: 386 10*3/uL (ref 150.0–400.0)
RBC: 4.55 Mil/uL (ref 3.87–5.11)
RDW: 15 % (ref 11.5–15.5)
WBC: 12 10*3/uL — ABNORMAL HIGH (ref 4.0–10.5)

## 2013-09-24 LAB — SEDIMENTATION RATE: SED RATE: 21 mm/h (ref 0–22)

## 2013-09-24 MED ORDER — HYDROCODONE-ACETAMINOPHEN 5-325 MG PO TABS: 1.0000 | ORAL_TABLET | Freq: Four times a day (QID) | ORAL | Status: DC | PRN

## 2013-09-24 MED ORDER — METRONIDAZOLE 250 MG PO TABS: 250.0000 mg | ORAL_TABLET | Freq: Two times a day (BID) | ORAL | Status: DC

## 2013-09-24 NOTE — Progress Notes (Signed)
[name] 29-Jul-1955 [id number]  Note: This dictation was prepared with Dragon digital system. Any transcriptional errors that result from this procedure are unintentional.   History of Present Illness:  This is a 58 year old white female with complicated Crohn's disease since 37. She had a subtotal colectomy in 1996 and diverting colostomy in August 2010. She has recently developed a peristomal abscess which was drained by Dr. Redmond Pulling in April 2015. The abscess measured 7x8x9 cm in diameter as per CT scan of the abdomen. She has done very well and the abscess has healed and she no longer has any drainage around the stomach. Her weight has increased to 84 pounds. She has been on Humira 40 mg weekly, mesalamine 4.8 g daily, and prednisone 10 mg daily. Her appetite has been good, but she is having a lot of abdominal and rectal pain for which she takes hydrocodone 2-3 tablets a day. We have been giving her 60 tablets a month but she says it lasts for only 20 days.    Past Medical History  Diagnosis Date  . Ulcerative colitis, unspecified   . Coronary atherosclerosis of unspecified type of vessel, native or graft   . Anal fistula   . Arthritis   . Personal history of colonic polyps   . Crohn's colitis   . History of colostomy   . Heart murmur   . Colitis 2015    Past Surgical History  Procedure Laterality Date  . Cholecystectomy    . Colon resection      1988 she thinks  . Angioplasty      1987  . Appendectomy    . Tubal ligation    . Colostomy  2012  . Irrigation and debridement abscess N/A 06/18/2013    Procedure: Irrigation and Debridiment of peristomal abcess;  Surgeon: Gayland Curry, MD;  Location: [facility];  Service: General;  Laterality: N/A;    Allergies  Allergen Reactions  . Remicade [Infliximab]     convulsions    Family history and social history have been reviewed.  Review of Systems:   The remainder of the 10 point ROS is negative except as outlined in  the H&P  Physical Exam: General Appearance chronically ill-appearing, in no distress Eyes  Non icteric  HEENT  Non traumatic, normocephalic  Mouth No lesion, tongue papillated, no cheilosis, edentulous Neck Supple without adenopathy, thyroid not enlarged, no carotid bruits, no JVD Lungs Clear to auscultation bilaterally COR Normal S1, normal S2, regular rhythm, no murmur, quiet precordium Abdomen colostomy appliance in the left upper quadrant. Well-healed surgical scar to the left of the stoma. Stoma appears healthy. Rectal not done Extremities  No pedal edema Skin No lesions Neurological Alert and oriented x 3 Psychological Normal mood and affect  Assessment and Plan:   Problem #45 58 year old white female with complicated Crohn's disease now doing reasonably well on high-dose Humira. She is now 2 months post drainage of a large peristomal abscess. She will continue all current medications. We will add Flagyl 250 mg by mouth twice a day. We are checking on her sedimentation rate, blood count and iron studies today. I will see her in 6 months.    [name] 09/24/2013

## 2013-09-24 NOTE — Patient Instructions (Addendum)
Your physician has requested that you go to the basement for the following lab work before leaving today: Sed Rate, IBC, CBC  We have sent the following medications to your pharmacy for you to pick up at your convenience: Flagyl 250 mg twice daily  We have given you a prescription for hydrocodone to take to your pharmacy no earlier than 10/09/13.  CC:Dr Melissa O Sullivan,Dr Vilinda Flake

## 2013-10-09 ENCOUNTER — Other Ambulatory Visit: Payer: Self-pay | Admitting: Internal Medicine

## 2013-10-25 ENCOUNTER — Telehealth: Payer: Self-pay | Admitting: Internal Medicine

## 2013-10-25 MED ORDER — HYDROCODONE-ACETAMINOPHEN 5-325 MG PO TABS: 1.0000 | ORAL_TABLET | Freq: Four times a day (QID) | ORAL | Status: DC | PRN

## 2013-10-25 NOTE — Telephone Encounter (Signed)
Rx created and awaiting Dr Brodie's signature. 

## 2013-11-19 ENCOUNTER — Encounter: Payer: Self-pay | Admitting: Internal Medicine

## 2013-11-25 ENCOUNTER — Telehealth: Payer: Self-pay | Admitting: Internal Medicine

## 2013-11-25 MED ORDER — HYDROCODONE-ACETAMINOPHEN 5-325 MG PO TABS: 1.0000 | ORAL_TABLET | Freq: Four times a day (QID) | ORAL | Status: DC | PRN

## 2013-11-25 NOTE — Telephone Encounter (Signed)
Rx created and awaiting Dr Brodie's signature. 

## 2013-12-23 ENCOUNTER — Telehealth: Payer: Self-pay | Admitting: Internal Medicine

## 2013-12-23 MED ORDER — HYDROCODONE-ACETAMINOPHEN 5-325 MG PO TABS: 1.0000 | ORAL_TABLET | Freq: Four times a day (QID) | ORAL | Status: DC | PRN

## 2013-12-23 NOTE — Telephone Encounter (Signed)
Rx created and awaiting Dr Brodie's signature. 

## 2013-12-24 ENCOUNTER — Other Ambulatory Visit: Payer: Self-pay | Admitting: Internal Medicine

## 2014-01-22 ENCOUNTER — Telehealth: Payer: Self-pay | Admitting: Internal Medicine

## 2014-01-22 MED ORDER — HYDROCODONE-ACETAMINOPHEN 5-325 MG PO TABS: 1.0000 | ORAL_TABLET | Freq: Four times a day (QID) | ORAL | Status: DC | PRN

## 2014-01-22 NOTE — Telephone Encounter (Signed)
Rx created and awaiting Dr Brodie's signature. 

## 2014-01-24 ENCOUNTER — Telehealth: Payer: Self-pay | Admitting: Internal Medicine

## 2014-01-24 NOTE — Telephone Encounter (Signed)
Dr Olevia Perches- Please advise.... Sulfasalazine?

## 2014-01-24 NOTE — Telephone Encounter (Signed)
Sulfasalazine 500nmg, 1 po bid x 1 week then 2 po bid, #120 4 refills. Folic acid 1mg , #100 1 po qd.

## 2014-01-27 MED ORDER — FOLIC ACID 1 MG PO TABS: 1.0000 mg | ORAL_TABLET | Freq: Every day | ORAL | Status: DC

## 2014-01-27 MED ORDER — SULFASALAZINE 500 MG PO TABS: ORAL_TABLET | ORAL | Status: DC

## 2014-01-27 NOTE — Telephone Encounter (Signed)
Patient advised of Dr Nichola Sizer recommendations. She verbalizes understanding. New rx sent to pharmacy for sulfasalazine and folic acid.

## 2014-02-21 ENCOUNTER — Telehealth: Payer: Self-pay | Admitting: Internal Medicine

## 2014-02-21 MED ORDER — HYDROCODONE-ACETAMINOPHEN 5-325 MG PO TABS: 1.0000 | ORAL_TABLET | Freq: Four times a day (QID) | ORAL | Status: DC | PRN

## 2014-02-21 NOTE — Telephone Encounter (Signed)
Rx created and awaiting Dr Brodie's signature. 

## 2014-03-27 ENCOUNTER — Other Ambulatory Visit (INDEPENDENT_AMBULATORY_CARE_PROVIDER_SITE_OTHER): Payer: Commercial Managed Care - HMO

## 2014-03-27 ENCOUNTER — Encounter: Payer: Self-pay | Admitting: Internal Medicine

## 2014-03-27 ENCOUNTER — Ambulatory Visit (INDEPENDENT_AMBULATORY_CARE_PROVIDER_SITE_OTHER): Payer: Commercial Managed Care - HMO | Admitting: Internal Medicine

## 2014-03-27 VITALS — BP 102/66 | HR 80 | Ht 58.66 in | Wt 103.4 lb

## 2014-03-27 DIAGNOSIS — K5090 Crohn's disease, unspecified, without complications: Secondary | ICD-10-CM | POA: Diagnosis not present

## 2014-03-27 DIAGNOSIS — Z933 Colostomy status: Secondary | ICD-10-CM

## 2014-03-27 LAB — CBC WITH DIFFERENTIAL/PLATELET
BASOS ABS: 0 10*3/uL (ref 0.0–0.1)
Basophils Relative: 0.5 % (ref 0.0–3.0)
EOS ABS: 0 10*3/uL (ref 0.0–0.7)
Eosinophils Relative: 0.1 % (ref 0.0–5.0)
HCT: 39.2 % (ref 36.0–46.0)
HEMOGLOBIN: 13.2 g/dL (ref 12.0–15.0)
Lymphocytes Relative: 19.2 % (ref 12.0–46.0)
Lymphs Abs: 2 10*3/uL (ref 0.7–4.0)
MCHC: 33.7 g/dL (ref 30.0–36.0)
MCV: 89.2 fl (ref 78.0–100.0)
MONO ABS: 0.2 10*3/uL (ref 0.1–1.0)
MONOS PCT: 2.3 % — AB (ref 3.0–12.0)
NEUTROS ABS: 8 10*3/uL — AB (ref 1.4–7.7)
NEUTROS PCT: 77.9 % — AB (ref 43.0–77.0)
Platelets: 357 10*3/uL (ref 150.0–400.0)
RBC: 4.4 Mil/uL (ref 3.87–5.11)
RDW: 14.7 % (ref 11.5–15.5)
WBC: 10.3 10*3/uL (ref 4.0–10.5)

## 2014-03-27 LAB — COMPREHENSIVE METABOLIC PANEL
ALT: 14 U/L (ref 0–35)
AST: 26 U/L (ref 0–37)
Albumin: 4.2 g/dL (ref 3.5–5.2)
Alkaline Phosphatase: 55 U/L (ref 39–117)
BILIRUBIN TOTAL: 0.4 mg/dL (ref 0.2–1.2)
BUN: 8 mg/dL (ref 6–23)
CALCIUM: 9.8 mg/dL (ref 8.4–10.5)
CO2: 29 mEq/L (ref 19–32)
CREATININE: 0.92 mg/dL (ref 0.40–1.20)
Chloride: 101 mEq/L (ref 96–112)
GFR: 66.35 mL/min (ref >60.00)
Glucose, Bld: 141 mg/dL — ABNORMAL HIGH (ref 70–99)
POTASSIUM: 4.2 meq/L (ref 3.5–5.1)
Sodium: 138 mEq/L (ref 135–145)
Total Protein: 7.2 g/dL (ref 6.0–8.3)

## 2014-03-27 LAB — IBC PANEL
Iron: 57 ug/dL (ref 42–145)
Saturation Ratios: 16.4 % — ABNORMAL LOW (ref 20.0–50.0)
Transferrin: 249 mg/dL (ref 212.0–360.0)

## 2014-03-27 LAB — VITAMIN B12: VITAMIN B 12: 285 pg/mL (ref 211–911)

## 2014-03-27 LAB — SEDIMENTATION RATE: Sed Rate: 14 mm/hr (ref 0–22)

## 2014-03-27 MED ORDER — HYDROCODONE-ACETAMINOPHEN 5-325 MG PO TABS: 1.0000 | ORAL_TABLET | Freq: Four times a day (QID) | ORAL | Status: DC | PRN

## 2014-03-27 NOTE — Progress Notes (Signed)
[name] [birth year] [id number]  Note: This dictation was prepared with Dragon digital system. Any transcriptional errors that result from this procedure are unintentional.   History of Present Illness: This is a 59 year old white female with Crohn's colitis with rectal involvement since 1986. Resection of descending colon stricture in 1988. Subtotal colectomy and ileostomy in 2010. Peristomal abscess was drained in April 2015. She has been on Humira 40 mg weekly and Azulfidine  500 mg twice a day. She has been also on prednisone 10 mg daily and has completed Flagyl 250 mg twice a day. She is doing very well since her last appointment in July 2015. She has gained almost 20 pounds. She has a loose stools all through colostomy but denies fever or bleeding. She is due for TB test. She needs refill on hydrocodone 5/325 taking 2-3 a day.    Past Medical History  Diagnosis Date  . Ulcerative colitis, unspecified   . Coronary atherosclerosis of unspecified type of vessel, native or graft   . Anal fistula   . Arthritis   . Personal history of colonic polyps   . Crohn's colitis   . History of colostomy   . Heart murmur   . Colitis 2015    Past Surgical History  Procedure Laterality Date  . Cholecystectomy    . Colon resection      1988 she thinks  . Angioplasty      1987  . Appendectomy    . Tubal ligation    . Colostomy  2012  . Irrigation and debridement abscess N/A 06/18/2013    Procedure: Irrigation and Debridiment of peristomal abcess;  Surgeon: Gayland Curry, MD;  Location: [facility];  Service: General;  Laterality: N/A;    Allergies  Allergen Reactions  . Remicade [Infliximab]     convulsions    Family history and social history have been reviewed.  Review of Systems: Negative for dysphagia. Heartburn. Positive for abdominal pain. Negative for bleeding per ileostomy  The remainder of the 10 point ROS is negative except as outlined in the H&P  Physical Exam: General  Appearance small stature. Chronically ill-appearing, in no distress Eyes  Non icteric  HEENT  Non traumatic, normocephalic  Mouth No lesion, tongue papillated, no cheilosis edentulous Neck Supple without adenopathy, thyroid not enlarged, no carotid bruits, no JVD Lungs Clear to auscultation bilaterally COR Normal S1, normal S2, regular rhythm, no murmur, quiet precordium Abdomen ileostomy in the left middle quadrant. Mild tenderness in the inferior aspect of the stoma or she had an abscess. There is no fluctuation or mass. Bowel sounds are normoactive. Digital exam of the stoma shows normal mucosa with the heme-negative mucus Rectal status post rectal closure. Extremities  No pedal edema Skin No lesions Neurological Alert and oriented x 3 Psychological Normal mood and affect  Assessment and Plan:   59 year old white female on biologicals doing extremely well being able to gain 20 pounds since last appointment 6 months ago. We will reduce prednisone to 5 mg daily and continue Humira at the same dose of 40 mg weekly. She will have a QuantiFERON-TB  test today and we will refill hydrocodone. She will also have blood count, iron studies, O13 ,metabolic panel and sedimentation rate; I will see her in 6 months    Sunny Gains @TODAY (<PARAMETER> error)@

## 2014-03-27 NOTE — Patient Instructions (Addendum)
Dr Debbrah Alar   Your physician has requested that you go to the basement for lab work before leaving today  We have sent the following medications to your pharmacy for you to pick up at your convenience:  Hydrocodone  Decrease your prednisone to 5mg 

## 2014-03-28 ENCOUNTER — Ambulatory Visit: Payer: Commercial Managed Care - HMO | Admitting: Internal Medicine

## 2014-03-30 LAB — QUANTIFERON TB GOLD ASSAY (BLOOD)
Interferon Gamma Release Assay: NEGATIVE
MITOGEN VALUE: 9.67 [IU]/mL
QUANTIFERON TB AG MINUS NIL: 0.01 [IU]/mL
Quantiferon Nil Value: 0.02 IU/mL
TB AG VALUE: 0.03 [IU]/mL

## 2014-04-22 ENCOUNTER — Encounter: Payer: Self-pay | Admitting: Internal Medicine

## 2014-04-28 ENCOUNTER — Telehealth: Payer: Self-pay | Admitting: Internal Medicine

## 2014-04-28 ENCOUNTER — Encounter: Payer: Self-pay | Admitting: Internal Medicine

## 2014-04-28 MED ORDER — HYDROCODONE-ACETAMINOPHEN 5-325 MG PO TABS: 1.0000 | ORAL_TABLET | Freq: Four times a day (QID) | ORAL | Status: DC | PRN

## 2014-04-28 NOTE — Telephone Encounter (Signed)
Called the patient to let them know that I printed out the Rx for Hydrocodone-acetaminophen (NORCO/VICODIN). Told the patient that Dr. Olevia Perches will be in on Wednesday (04/30/14) and will sign then. Told the patient that I will call them once the Rx has been signed. Patient stated they understood.

## 2014-04-29 NOTE — Telephone Encounter (Signed)
Called patient to let them know that their Rx for Hydrocodone-acetaminophen (NORCO/VICODIN), 5-325 mg was ready to be picked up. I told the patient she could pickup Rx from the receptionist. Patient stated she understood. Patient stated she would pick up her Rx tomorrow, 04/29/14.

## 2014-05-02 ENCOUNTER — Ambulatory Visit (AMBULATORY_SURGERY_CENTER): Payer: Self-pay | Admitting: *Deleted

## 2014-05-02 VITALS — Ht 59.5 in | Wt 101.8 lb

## 2014-05-02 DIAGNOSIS — K5090 Crohn's disease, unspecified, without complications: Secondary | ICD-10-CM

## 2014-05-02 MED ORDER — MOVIPREP 100 G PO SOLR: ORAL | Status: DC

## 2014-05-02 NOTE — Progress Notes (Signed)
No allergies to eggs or soy. No problems with anesthesia.  Pt given Emmi instructions for colonoscopy  No oxygen use  No diet drug use  

## 2014-05-05 ENCOUNTER — Telehealth: Payer: Self-pay | Admitting: *Deleted

## 2014-05-05 NOTE — Telephone Encounter (Signed)
Patient's insurance will not cover Moviprep for the patient's colonoscopy, which is on 06/02/14 at 2:00 pm. Can the patient have Suprep instead? Please advise.

## 2014-05-06 ENCOUNTER — Telehealth: Payer: Self-pay | Admitting: *Deleted

## 2014-05-06 NOTE — Telephone Encounter (Signed)
Per Dr. Olevia Perches, patient is allowed to use Suprep because Moviprep was not covered by insurance. Will give patient a free Suprep sample, Lot #[id number], Expiration date: 1/18. Called patient to let them know that I will be giving them a free Suprep. They will call me tomorrow to let me know when they can come in and pickup. I will have to re-explain the new instructions for Suprep. Patient stated they understood.

## 2014-05-08 NOTE — Telephone Encounter (Signed)
Patient picked up Suprep sample on 05/08/14 at 1:58 pm. I explained the new procedures for using the Suprep properly. Patient stated she understood.

## 2014-05-12 ENCOUNTER — Encounter: Payer: Self-pay | Admitting: Internal Medicine

## 2014-05-26 ENCOUNTER — Telehealth: Payer: Self-pay | Admitting: Internal Medicine

## 2014-05-26 NOTE — Telephone Encounter (Signed)
Hydrocodone prescription last filled 04/28/2014, should last 30 days. Do not refill before 05/27/2014. Give #90, has to last till 06/27/2014

## 2014-05-26 NOTE — Telephone Encounter (Signed)
The patient would like a refill on hydrocodone-acetaminophen (NORCO/VICODIN), 5-325 mg, #90 with no refills. The last time they had a refill for this was on 04/28/14. The patient is to take 1 tablet, po, q6h, prn for moderate pain.Their next appointment with you will be on 06/02/14 at 2:00 pm for a colonoscopy. Is this patient allowed to have a refill for this medication? If so, how much before they come to their appointment with you. Please advise.

## 2014-05-27 ENCOUNTER — Telehealth: Payer: Self-pay | Admitting: *Deleted

## 2014-05-27 MED ORDER — HYDROCODONE-ACETAMINOPHEN 5-325 MG PO TABS: 1.0000 | ORAL_TABLET | Freq: Four times a day (QID) | ORAL | Status: DC | PRN

## 2014-05-27 NOTE — Telephone Encounter (Signed)
Called the patient up at 3:10 pm on 05/27/14 to let them know their Rx for hydrocodone-acetaminophen (NORCO/VICODIN), 5-325 mg, #90 with no refills was ready to be picked up. I told patient that this Rx did have to last until 06/27/14 before refilling again according to Dr. Olevia Perches. I accidentally faxed Rx earlier today by mistake. Patient said they would pick up this week.

## 2014-05-27 NOTE — Telephone Encounter (Signed)
Rx for hydrocodone-acetaminophen, 5-325 mg, #90 with no refills was faxed over to [facility] on 05/27/14. Per Dr. Olevia Perches, this patient is not allowed to have any new refills for this Rx before 06/27/14. This medication must last the patient 30 days.

## 2014-06-02 ENCOUNTER — Encounter (HOSPITAL_COMMUNITY): Admission: EM | Disposition: A | Discharge status: Skilled Nursing Facility | Payer: Self-pay | Source: Home / Self Care

## 2014-06-02 ENCOUNTER — Other Ambulatory Visit: Payer: Self-pay | Admitting: *Deleted

## 2014-06-02 ENCOUNTER — Encounter (HOSPITAL_COMMUNITY): Payer: Self-pay | Admitting: Emergency Medicine

## 2014-06-02 ENCOUNTER — Encounter: Payer: Self-pay | Admitting: Internal Medicine

## 2014-06-02 ENCOUNTER — Emergency Department (HOSPITAL_COMMUNITY): Payer: Commercial Managed Care - HMO | Admitting: Certified Registered Nurse Anesthetist

## 2014-06-02 ENCOUNTER — Inpatient Hospital Stay (HOSPITAL_COMMUNITY)
Admission: EM | Admit: 2014-06-02 | Discharge: 2014-06-23 | DRG: 329 | Disposition: A | Discharge status: Skilled Nursing Facility | Payer: Commercial Managed Care - HMO | Attending: General Surgery | Admitting: General Surgery

## 2014-06-02 ENCOUNTER — Emergency Department (HOSPITAL_COMMUNITY): Payer: Commercial Managed Care - HMO

## 2014-06-02 ENCOUNTER — Ambulatory Visit (AMBULATORY_SURGERY_CENTER): Payer: Commercial Managed Care - HMO | Admitting: Internal Medicine

## 2014-06-02 VITALS — BP 182/87 | HR 78 | Temp 99.3°F | Resp 31 | Ht 59.5 in | Wt 101.0 lb

## 2014-06-02 DIAGNOSIS — J80 Acute respiratory distress syndrome: Secondary | ICD-10-CM

## 2014-06-02 DIAGNOSIS — F17210 Nicotine dependence, cigarettes, uncomplicated: Secondary | ICD-10-CM | POA: Diagnosis present

## 2014-06-02 DIAGNOSIS — E43 Unspecified severe protein-calorie malnutrition: Secondary | ICD-10-CM

## 2014-06-02 DIAGNOSIS — I252 Old myocardial infarction: Secondary | ICD-10-CM | POA: Diagnosis not present

## 2014-06-02 DIAGNOSIS — Y95 Nosocomial condition: Secondary | ICD-10-CM | POA: Diagnosis present

## 2014-06-02 DIAGNOSIS — Z8249 Family history of ischemic heart disease and other diseases of the circulatory system: Secondary | ICD-10-CM | POA: Diagnosis not present

## 2014-06-02 DIAGNOSIS — K9403 Colostomy malfunction: Secondary | ICD-10-CM

## 2014-06-02 DIAGNOSIS — A419 Sepsis, unspecified organism: Secondary | ICD-10-CM | POA: Diagnosis not present

## 2014-06-02 DIAGNOSIS — K50119 Crohn's disease of large intestine with unspecified complications: Secondary | ICD-10-CM | POA: Diagnosis not present

## 2014-06-02 DIAGNOSIS — J95821 Acute postprocedural respiratory failure: Secondary | ICD-10-CM | POA: Diagnosis present

## 2014-06-02 DIAGNOSIS — Z933 Colostomy status: Secondary | ICD-10-CM

## 2014-06-02 DIAGNOSIS — J189 Pneumonia, unspecified organism: Secondary | ICD-10-CM | POA: Diagnosis not present

## 2014-06-02 DIAGNOSIS — I2510 Atherosclerotic heart disease of native coronary artery without angina pectoris: Secondary | ICD-10-CM | POA: Diagnosis present

## 2014-06-02 DIAGNOSIS — R7989 Other specified abnormal findings of blood chemistry: Secondary | ICD-10-CM | POA: Diagnosis not present

## 2014-06-02 DIAGNOSIS — Z6821 Body mass index (BMI) 21.0-21.9, adult: Secondary | ICD-10-CM | POA: Diagnosis not present

## 2014-06-02 DIAGNOSIS — M1990 Unspecified osteoarthritis, unspecified site: Secondary | ICD-10-CM | POA: Diagnosis present

## 2014-06-02 DIAGNOSIS — Z7982 Long term (current) use of aspirin: Secondary | ICD-10-CM

## 2014-06-02 DIAGNOSIS — Z86010 Personal history of colonic polyps: Secondary | ICD-10-CM

## 2014-06-02 DIAGNOSIS — D649 Anemia, unspecified: Secondary | ICD-10-CM | POA: Diagnosis present

## 2014-06-02 DIAGNOSIS — Z01818 Encounter for other preprocedural examination: Secondary | ICD-10-CM

## 2014-06-02 DIAGNOSIS — F17200 Nicotine dependence, unspecified, uncomplicated: Secondary | ICD-10-CM

## 2014-06-02 DIAGNOSIS — J9600 Acute respiratory failure, unspecified whether with hypoxia or hypercapnia: Secondary | ICD-10-CM

## 2014-06-02 DIAGNOSIS — K631 Perforation of intestine (nontraumatic): Secondary | ICD-10-CM | POA: Diagnosis present

## 2014-06-02 DIAGNOSIS — R6521 Severe sepsis with septic shock: Secondary | ICD-10-CM

## 2014-06-02 DIAGNOSIS — E876 Hypokalemia: Secondary | ICD-10-CM | POA: Diagnosis not present

## 2014-06-02 DIAGNOSIS — R778 Other specified abnormalities of plasma proteins: Secondary | ICD-10-CM | POA: Diagnosis not present

## 2014-06-02 DIAGNOSIS — I9581 Postprocedural hypotension: Secondary | ICD-10-CM | POA: Diagnosis not present

## 2014-06-02 DIAGNOSIS — K659 Peritonitis, unspecified: Secondary | ICD-10-CM | POA: Diagnosis present

## 2014-06-02 DIAGNOSIS — K5090 Crohn's disease, unspecified, without complications: Secondary | ICD-10-CM | POA: Diagnosis not present

## 2014-06-02 DIAGNOSIS — R0989 Other specified symptoms and signs involving the circulatory and respiratory systems: Secondary | ICD-10-CM | POA: Diagnosis not present

## 2014-06-02 DIAGNOSIS — J810 Acute pulmonary edema: Secondary | ICD-10-CM | POA: Diagnosis not present

## 2014-06-02 DIAGNOSIS — Z955 Presence of coronary angioplasty implant and graft: Secondary | ICD-10-CM | POA: Diagnosis not present

## 2014-06-02 DIAGNOSIS — K5010 Crohn's disease of large intestine without complications: Secondary | ICD-10-CM | POA: Diagnosis present

## 2014-06-02 DIAGNOSIS — J9690 Respiratory failure, unspecified, unspecified whether with hypoxia or hypercapnia: Secondary | ICD-10-CM

## 2014-06-02 DIAGNOSIS — Z888 Allergy status to other drugs, medicaments and biological substances status: Secondary | ICD-10-CM | POA: Diagnosis not present

## 2014-06-02 DIAGNOSIS — T8112XA Postprocedural septic shock, initial encounter: Secondary | ICD-10-CM | POA: Diagnosis not present

## 2014-06-02 DIAGNOSIS — Z7952 Long term (current) use of systemic steroids: Secondary | ICD-10-CM | POA: Diagnosis not present

## 2014-06-02 DIAGNOSIS — E870 Hyperosmolality and hypernatremia: Secondary | ICD-10-CM | POA: Diagnosis not present

## 2014-06-02 DIAGNOSIS — J9601 Acute respiratory failure with hypoxia: Secondary | ICD-10-CM | POA: Diagnosis not present

## 2014-06-02 DIAGNOSIS — Z79899 Other long term (current) drug therapy: Secondary | ICD-10-CM | POA: Diagnosis not present

## 2014-06-02 DIAGNOSIS — Z9049 Acquired absence of other specified parts of digestive tract: Secondary | ICD-10-CM | POA: Diagnosis present

## 2014-06-02 DIAGNOSIS — Z978 Presence of other specified devices: Secondary | ICD-10-CM

## 2014-06-02 HISTORY — PX: LAPAROTOMY: SHX154

## 2014-06-02 LAB — ABO/RH: ABO/RH(D): O POS

## 2014-06-02 LAB — COMPREHENSIVE METABOLIC PANEL
ALBUMIN: 3.8 g/dL (ref 3.5–5.2)
ALT: 13 U/L (ref 0–35)
AST: 24 U/L (ref 0–37)
Alkaline Phosphatase: 72 U/L (ref 39–117)
Anion gap: 12 (ref 5–15)
BILIRUBIN TOTAL: 0.3 mg/dL (ref 0.3–1.2)
BUN: 9 mg/dL (ref 6–23)
CALCIUM: 8.7 mg/dL (ref 8.4–10.5)
CHLORIDE: 97 mmol/L (ref 96–112)
CO2: 24 mmol/L (ref 19–32)
Creatinine, Ser: 0.72 mg/dL (ref 0.50–1.10)
GFR calc Af Amer: >90 mL/min (ref >90)
GFR calc non Af Amer: >90 mL/min (ref >90)
Glucose, Bld: 154 mg/dL — ABNORMAL HIGH (ref 70–99)
Potassium: 2.8 mmol/L — ABNORMAL LOW (ref 3.5–5.1)
SODIUM: 133 mmol/L — AB (ref 135–145)
Total Protein: 7.2 g/dL (ref 6.0–8.3)

## 2014-06-02 LAB — TYPE AND SCREEN
ABO/RH(D): O POS
ANTIBODY SCREEN: NEGATIVE

## 2014-06-02 LAB — CBC WITH DIFFERENTIAL/PLATELET
Basophils Absolute: 0 10*3/uL (ref 0.0–0.1)
Basophils Relative: 0 % (ref 0–1)
Eosinophils Absolute: 0.1 10*3/uL (ref 0.0–0.7)
Eosinophils Relative: 0 % (ref 0–5)
HCT: 39.8 % (ref 36.0–46.0)
HEMOGLOBIN: 12.7 g/dL (ref 12.0–15.0)
LYMPHS ABS: 2.8 10*3/uL (ref 0.7–4.0)
Lymphocytes Relative: 16 % (ref 12–46)
MCH: 29.9 pg (ref 26.0–34.0)
MCHC: 31.9 g/dL (ref 30.0–36.0)
MCV: 93.6 fL (ref 78.0–100.0)
Monocytes Absolute: 0.9 10*3/uL (ref 0.1–1.0)
Monocytes Relative: 5 % (ref 3–12)
NEUTROS PCT: 79 % — AB (ref 43–77)
Neutro Abs: 14 10*3/uL — ABNORMAL HIGH (ref 1.7–7.7)
Platelets: 502 10*3/uL — ABNORMAL HIGH (ref 150–400)
RBC: 4.25 MIL/uL (ref 3.87–5.11)
RDW: 12.8 % (ref 11.5–15.5)
WBC: 17.8 10*3/uL — AB (ref 4.0–10.5)

## 2014-06-02 LAB — POC OCCULT BLOOD, ED: Fecal Occult Bld: POSITIVE — AB

## 2014-06-02 LAB — I-STAT CG4 LACTIC ACID, ED: Lactic Acid, Venous: 1.1 mmol/L (ref 0.5–2.0)

## 2014-06-02 SURGERY — LAPAROTOMY, EXPLORATORY
Anesthesia: General | Site: Abdomen

## 2014-06-02 MED ORDER — NEOSTIGMINE METHYLSULFATE 10 MG/10ML IV SOLN
INTRAVENOUS | Status: AC
  Filled 2014-06-02: qty 1

## 2014-06-02 MED ORDER — HYDROMORPHONE HCL 1 MG/ML IJ SOLN
INTRAMUSCULAR | Status: AC
  Administered 2014-06-02: 23:00:00
  Filled 2014-06-02: qty 1

## 2014-06-02 MED ORDER — GLYCOPYRROLATE 0.2 MG/ML IJ SOLN
INTRAMUSCULAR | Status: DC | PRN
  Administered 2014-06-02: 0.4 mg via INTRAVENOUS

## 2014-06-02 MED ORDER — MIDAZOLAM HCL 2 MG/2ML IJ SOLN
INTRAMUSCULAR | Status: AC
  Filled 2014-06-02: qty 2

## 2014-06-02 MED ORDER — LIDOCAINE HCL (CARDIAC) 20 MG/ML IV SOLN
INTRAVENOUS | Status: DC | PRN
  Administered 2014-06-02: 80 mg via INTRAVENOUS

## 2014-06-02 MED ORDER — GLYCOPYRROLATE 0.2 MG/ML IJ SOLN
INTRAMUSCULAR | Status: AC
  Filled 2014-06-02: qty 3

## 2014-06-02 MED ORDER — KCL IN DEXTROSE-NACL 20-5-0.9 MEQ/L-%-% IV SOLN
INTRAVENOUS | Status: DC
  Administered 2014-06-02 – 2014-06-06 (×11): via INTRAVENOUS
  Filled 2014-06-02 (×10): qty 1000

## 2014-06-02 MED ORDER — ONDANSETRON HCL 4 MG/2ML IJ SOLN: 4.0000 mg | Freq: Four times a day (QID) | INTRAMUSCULAR | Status: DC | PRN

## 2014-06-02 MED ORDER — PIPERACILLIN-TAZOBACTAM 3.375 G IVPB
3.3750 g | Freq: Three times a day (TID) | INTRAVENOUS | Status: DC
  Administered 2014-06-03 – 2014-06-13 (×31): 3.375 g via INTRAVENOUS
  Filled 2014-06-02 (×30): qty 50

## 2014-06-02 MED ORDER — SODIUM CHLORIDE 0.9 % IV BOLUS (SEPSIS)
1000.0000 mL | Freq: Once | INTRAVENOUS | Status: AC
  Administered 2014-06-02: 1000 mL via INTRAVENOUS

## 2014-06-02 MED ORDER — LIDOCAINE HCL (CARDIAC) 20 MG/ML IV SOLN
INTRAVENOUS | Status: AC
  Filled 2014-06-02: qty 5

## 2014-06-02 MED ORDER — CIPROFLOXACIN HCL 500 MG PO TABS: 500.0000 mg | ORAL_TABLET | Freq: Two times a day (BID) | ORAL | Status: DC

## 2014-06-02 MED ORDER — HYDROCORTISONE NA SUCCINATE PF 100 MG IJ SOLR
INTRAMUSCULAR | Status: DC | PRN
  Administered 2014-06-02: 50 mg via INTRAVENOUS

## 2014-06-02 MED ORDER — SUCCINYLCHOLINE CHLORIDE 20 MG/ML IJ SOLN
INTRAMUSCULAR | Status: DC | PRN
  Administered 2014-06-02: 80 mg via INTRAVENOUS

## 2014-06-02 MED ORDER — HYDROMORPHONE HCL 1 MG/ML IJ SOLN
1.0000 mg | INTRAMUSCULAR | Status: DC | PRN
  Administered 2014-06-03: 1 mg via INTRAVENOUS
  Administered 2014-06-03: 2 mg via INTRAVENOUS
  Administered 2014-06-03 (×4): 1 mg via INTRAVENOUS
  Filled 2014-06-02 (×3): qty 1
  Filled 2014-06-02: qty 2
  Filled 2014-06-02 (×2): qty 1

## 2014-06-02 MED ORDER — FENTANYL CITRATE 0.05 MG/ML IJ SOLN
INTRAMUSCULAR | Status: AC
  Filled 2014-06-02: qty 2

## 2014-06-02 MED ORDER — FENTANYL CITRATE 0.05 MG/ML IJ SOLN
INTRAMUSCULAR | Status: DC | PRN
  Administered 2014-06-02 (×9): 50 ug via INTRAVENOUS

## 2014-06-02 MED ORDER — ROCURONIUM BROMIDE 100 MG/10ML IV SOLN
INTRAVENOUS | Status: DC | PRN
  Administered 2014-06-02: 20 mg via INTRAVENOUS
  Administered 2014-06-02: 10 mg via INTRAVENOUS
  Administered 2014-06-02: 5 mg via INTRAVENOUS

## 2014-06-02 MED ORDER — FENTANYL CITRATE 0.05 MG/ML IJ SOLN
INTRAMUSCULAR | Status: AC
  Filled 2014-06-02: qty 5

## 2014-06-02 MED ORDER — PHENYLEPHRINE HCL 10 MG/ML IJ SOLN
INTRAMUSCULAR | Status: DC | PRN
  Administered 2014-06-02: 40 ug via INTRAVENOUS
  Administered 2014-06-02 (×3): 80 ug via INTRAVENOUS
  Administered 2014-06-02: 40 ug via INTRAVENOUS
  Administered 2014-06-02: 80 ug via INTRAVENOUS

## 2014-06-02 MED ORDER — HYDROMORPHONE HCL 1 MG/ML IJ SOLN
1.0000 mg | Freq: Once | INTRAMUSCULAR | Status: AC
  Administered 2014-06-02: 1 mg via INTRAVENOUS
  Filled 2014-06-02: qty 1

## 2014-06-02 MED ORDER — ACETAMINOPHEN 10 MG/ML IV SOLN
1000.0000 mg | Freq: Once | INTRAVENOUS | Status: AC
  Administered 2014-06-02: 1000 mg via INTRAVENOUS
  Filled 2014-06-02: qty 100

## 2014-06-02 MED ORDER — PROPOFOL 10 MG/ML IV BOLUS
INTRAVENOUS | Status: DC | PRN
  Administered 2014-06-02: 90 mg via INTRAVENOUS

## 2014-06-02 MED ORDER — 0.9 % SODIUM CHLORIDE (POUR BTL) OPTIME
TOPICAL | Status: DC | PRN
  Administered 2014-06-02: 2000 mL

## 2014-06-02 MED ORDER — ONDANSETRON HCL 4 MG/2ML IJ SOLN
INTRAMUSCULAR | Status: AC
  Filled 2014-06-02: qty 2

## 2014-06-02 MED ORDER — LACTATED RINGERS IV SOLN
INTRAVENOUS | Status: DC | PRN
  Administered 2014-06-02 (×2): via INTRAVENOUS

## 2014-06-02 MED ORDER — HYDROCORTISONE NA SUCCINATE PF 100 MG IJ SOLR
20.0000 mg | Freq: Every day | INTRAMUSCULAR | Status: DC
  Administered 2014-06-03: 20 mg via INTRAVENOUS
  Filled 2014-06-02: qty 2

## 2014-06-02 MED ORDER — ONDANSETRON HCL 4 MG/2ML IJ SOLN
INTRAMUSCULAR | Status: DC | PRN
  Administered 2014-06-02: 4 mg via INTRAVENOUS

## 2014-06-02 MED ORDER — NEOSTIGMINE METHYLSULFATE 10 MG/10ML IV SOLN
INTRAVENOUS | Status: DC | PRN
  Administered 2014-06-02: 3 mg via INTRAVENOUS

## 2014-06-02 MED ORDER — LACTATED RINGERS IV SOLN: INTRAVENOUS | Status: DC

## 2014-06-02 MED ORDER — PROPOFOL 10 MG/ML IV BOLUS
INTRAVENOUS | Status: AC
  Filled 2014-06-02: qty 20

## 2014-06-02 MED ORDER — KCL IN DEXTROSE-NACL 20-5-0.9 MEQ/L-%-% IV SOLN
INTRAVENOUS | Status: AC
  Filled 2014-06-02: qty 1000

## 2014-06-02 MED ORDER — ONDANSETRON HCL 4 MG PO TABS: 4.0000 mg | ORAL_TABLET | Freq: Four times a day (QID) | ORAL | Status: DC | PRN

## 2014-06-02 MED ORDER — HYDROMORPHONE HCL 1 MG/ML IJ SOLN
0.2500 mg | INTRAMUSCULAR | Status: DC | PRN
  Administered 2014-06-02 (×4): 0.5 mg via INTRAVENOUS

## 2014-06-02 MED ORDER — HYDROCORTISONE NA SUCCINATE PF 100 MG IJ SOLR
INTRAMUSCULAR | Status: AC
  Filled 2014-06-02: qty 2

## 2014-06-02 MED ORDER — HEPARIN SODIUM (PORCINE) 5000 UNIT/ML IJ SOLN
5000.0000 [IU] | Freq: Three times a day (TID) | INTRAMUSCULAR | Status: DC
  Administered 2014-06-03 – 2014-06-04 (×4): 5000 [IU] via SUBCUTANEOUS
  Filled 2014-06-02 (×4): qty 1

## 2014-06-02 MED ORDER — SODIUM CHLORIDE 0.9 % IV SOLN
500.0000 mL | INTRAVENOUS | Status: DC
  Administered 2014-06-02: 20:00:00 via INTRAVENOUS

## 2014-06-02 MED ORDER — POTASSIUM CHLORIDE 10 MEQ/100ML IV SOLN
10.0000 meq | INTRAVENOUS | Status: AC
  Administered 2014-06-02 (×2): 10 meq via INTRAVENOUS
  Filled 2014-06-02 (×2): qty 100

## 2014-06-02 MED ORDER — PIPERACILLIN-TAZOBACTAM 3.375 G IVPB
3.3750 g | Freq: Once | INTRAVENOUS | Status: AC
  Administered 2014-06-02: 3.375 g via INTRAVENOUS
  Filled 2014-06-02: qty 50

## 2014-06-02 MED ORDER — ONDANSETRON HCL 4 MG/2ML IJ SOLN
4.0000 mg | Freq: Once | INTRAMUSCULAR | Status: AC
  Administered 2014-06-02: 4 mg via INTRAVENOUS
  Filled 2014-06-02: qty 2

## 2014-06-02 MED ORDER — ROCURONIUM BROMIDE 100 MG/10ML IV SOLN
INTRAVENOUS | Status: AC
  Filled 2014-06-02: qty 1

## 2014-06-02 SURGICAL SUPPLY — 43 items
APPLICATOR COTTON TIP 6IN STRL (MISCELLANEOUS) ×9 IMPLANT
BLADE EXTENDED COATED 6.5IN (ELECTRODE) ×3 IMPLANT
BLADE HEX COATED 2.75 (ELECTRODE) ×3 IMPLANT
CLAMP POUCH DRAINAGE QUIET (OSTOMY) ×3 IMPLANT
COVER MAYO STAND STRL (DRAPES) ×3 IMPLANT
DRAPE INCISE IOBAN 66X45 STRL (DRAPES) ×3 IMPLANT
DRAPE LAPAROSCOPIC ABDOMINAL (DRAPES) ×3 IMPLANT
DRAPE WARM FLUID 44X44 (DRAPE) ×3 IMPLANT
ELECT REM PT RETURN 9FT ADLT (ELECTROSURGICAL) ×3
ELECTRODE REM PT RTRN 9FT ADLT (ELECTROSURGICAL) ×1 IMPLANT
GAUZE SPONGE 4X4 12PLY STRL (GAUZE/BANDAGES/DRESSINGS) ×3 IMPLANT
GLOVE BIOGEL PI IND STRL 7.0 (GLOVE) IMPLANT
GLOVE BIOGEL PI IND STRL 7.5 (GLOVE) ×2 IMPLANT
GLOVE BIOGEL PI INDICATOR 7.0 (GLOVE)
GLOVE BIOGEL PI INDICATOR 7.5 (GLOVE) ×4
GLOVE ECLIPSE 7.5 STRL STRAW (GLOVE) ×3 IMPLANT
GLOVE SURG ORTHO 8.0 STRL STRW (GLOVE) ×3 IMPLANT
GLOVE SURG SS PI 7.5 STRL IVOR (GLOVE) ×3 IMPLANT
GOWN STRL REUS W/TWL LRG LVL3 (GOWN DISPOSABLE) ×3 IMPLANT
GOWN STRL REUS W/TWL XL LVL3 (GOWN DISPOSABLE) ×6 IMPLANT
KIT BASIN OR (CUSTOM PROCEDURE TRAY) ×3 IMPLANT
LIGASURE IMPACT 36 18CM CVD LR (INSTRUMENTS) ×3 IMPLANT
NS IRRIG 1000ML POUR BTL (IV SOLUTION) ×21 IMPLANT
PACK GENERAL/GYN (CUSTOM PROCEDURE TRAY) ×6 IMPLANT
POUCH OSTOMY 1 PC DRNBL  2 1/2 (OSTOMY) ×1 IMPLANT
POUCH OSTOMY 2 1/2 (OSTOMY) ×2
SPONGE LAP 18X18 X RAY DECT (DISPOSABLE) ×6 IMPLANT
STAPLER PROXIMATE 75MM BLUE (STAPLE) ×3 IMPLANT
STAPLER VISISTAT 35W (STAPLE) ×3 IMPLANT
SUCTION POOLE TIP (SUCTIONS) ×3 IMPLANT
SUT NOVA 1 T20/GS 25DT (SUTURE) ×3 IMPLANT
SUT PDS AB 1 CTX 36 (SUTURE) IMPLANT
SUT PDS AB 1 TP1 96 (SUTURE) ×6 IMPLANT
SUT SILK 2 0 (SUTURE) ×2
SUT SILK 2 0 SH CR/8 (SUTURE) ×3 IMPLANT
SUT SILK 2-0 18XBRD TIE 12 (SUTURE) ×1 IMPLANT
SUT SILK 3 0 (SUTURE) ×2
SUT SILK 3 0 SH CR/8 (SUTURE) ×3 IMPLANT
SUT SILK 3-0 18XBRD TIE 12 (SUTURE) ×1 IMPLANT
SUT VIC AB 3-0 SH 18 (SUTURE) ×6 IMPLANT
TOWEL OR 17X26 10 PK STRL BLUE (TOWEL DISPOSABLE) ×3 IMPLANT
TRAY FOLEY CATH 14FRSI W/METER (CATHETERS) ×3 IMPLANT
YANKAUER SUCT BULB TIP NO VENT (SUCTIONS) ×3 IMPLANT

## 2014-06-02 NOTE — H&P (Signed)
[name] is an 59 y.o. female.    Chief Complaint: Abdominal pain  HPI: Patient has a long history of Crohn's disease with at least 2 previous partial colectomies and left-sided ostomy. She underwent colonoscopy today with findings of a stricture very near the ostomy. There was concern for a tear of the colon at the time of ostomy. The patient has had steadily worsening diffuse abdominal pain since the procedure. Evaluation in the emergency department includes chest x-ray and abdominal x-ray showing a significant amount of free air.  Past Medical History  Diagnosis Date  . Ulcerative colitis, unspecified   . Coronary atherosclerosis of unspecified type of vessel, native or graft   . Anal fistula   . Arthritis   . Personal history of colonic polyps   . Crohn's colitis   . History of colostomy   . Heart murmur   . Colitis 2015  . Hx of myocardial infarction 1986    Past Surgical History  Procedure Laterality Date  . Cholecystectomy    . Colon resection  1988    1988 she thinks  . Angioplasty      1987  . Appendectomy    . Tubal ligation  1993  . Colostomy  2012  . Irrigation and debridement abscess N/A 06/18/2013    Procedure: Irrigation and Debridiment of peristomal abcess;  Surgeon: Gayland Curry, MD;  Location: [facility];  Service: General;  Laterality: N/A;    Family History  Problem Relation Age of Onset  . Heart disease Brother   . Heart disease Mother   . Lung cancer Paternal Uncle   . Colon polyps Neg Hx   . Esophageal cancer Neg Hx   . Rectal cancer Neg Hx   . Stomach cancer Neg Hx   . Stroke Father    Social History:  reports that she has been smoking Cigarettes.  She has been smoking about 0.50 packs per day. She has never used smokeless tobacco. She reports that she does not drink alcohol or use illicit drugs.  Allergies:  Allergies  Allergen Reactions  . Remicade [Infliximab]     convulsions    Current Facility-Administered Medications  Medication  Dose Route Frequency Provider Last Rate Last Dose  . piperacillin-tazobactam (ZOSYN) IVPB 3.375 g  3.375 g Intravenous Once Ernestina Patches, MD      . pneumococcal 23 valent vaccine (PNU-IMMUNE) injection 0.5 mL  0.5 mL Intramuscular Once Lafayette Dragon, MD      . sodium chloride 0.9 % bolus 1,000 mL  1,000 mL Intravenous Once Ernestina Patches, MD       Current Outpatient Prescriptions  Medication Sig Dispense Refill  . Adalimumab 40 MG/0.8ML PNKT Inject 0.8 mLs into the skin once a week.    Marland Kitchen alendronate (FOSAMAX) 70 MG tablet 70 mg. Take with a full glass of water on an empty stomach. No food for 30 minutes after taking Takes on Tuesdays    . aspirin 81 MG tablet Take 81 mg by mouth daily.      . Calcium Carbonate-Vitamin D (CALCIUM-VITAMIN D) 600-125 MG-UNIT TABS Take $RemoveB'1200mg'[id number]$  with vitamin D daily. 60 each 0  . diazepam (VALIUM) 5 MG tablet TAKE 1 TABLET BY MOUTH EVERY DAY AS NEEDED 30 tablet 1  . dicyclomine (BENTYL) 20 MG tablet Take 1 tablet by mouth twice daily as needed for crampy abdominal pain 60 tablet 5  . ergocalciferol (VITAMIN D2) 50000 UNITS capsule Take one po weekly x 12 weeks 4 capsule  3  . folic acid (FOLVITE) 1 MG tablet Take 1 tablet (1 mg total) by mouth daily. 100 tablet 1  . HYDROcodone-acetaminophen (NORCO/VICODIN) 5-325 MG per tablet Take 1 tablet by mouth every 6 (six) hours as needed for moderate pain. 90 tablet 0  . Multiple Vitamin (MULTIVITAMIN PO) Take 1 tablet by mouth daily.     . predniSONE (DELTASONE) 10 MG tablet TAKE 1 TABLET BY MOUTH EVERY DAY AS DIRECTED 100 tablet 0  . sulfaSALAzine (AZULFIDINE) 500 MG tablet Take 1 tablet by mouth twice daily x 1 week, then increase to 2 tablets twice daily thereafter. Pharmacy-please d/c rx for lialda (Patient taking differently: Take 1,000 mg by mouth 2 (two) times daily. l) 120 tablet 4  . adalimumab (HUMIRA) 40 MG/0.8ML injection Inject 0.8 mLs (40 mg total) into the skin once a week. 1 each 0  . ciprofloxacin (CIPRO) 500  MG tablet Take 1 tablet (500 mg total) by mouth 2 (two) times daily. 20 tablet 0   Facility-Administered Medications Ordered in Other Encounters  Medication Dose Route Frequency Provider Last Rate Last Dose  . 0.9 %  sodium chloride infusion  500 mL Intravenous Continuous Lafayette Dragon, MD         Results for orders placed or performed during the hospital encounter of 06/02/14 (from the past 48 hour(s))  CBC with Differential/Platelet     Status: Abnormal   Collection Time: 06/02/14  5:03 PM  Result Value Ref Range   WBC 17.8 (H) 4.0 - 10.5 K/uL   RBC 4.25 3.87 - 5.11 MIL/uL   Hemoglobin 12.7 12.0 - 15.0 g/dL   HCT 39.8 36.0 - 46.0 %   MCV 93.6 78.0 - 100.0 fL   MCH 29.9 26.0 - 34.0 pg   MCHC 31.9 30.0 - 36.0 g/dL   RDW 12.8 11.5 - 15.5 %   Platelets 502 (H) 150 - 400 K/uL   Neutrophils Relative % 79 (H) 43 - 77 %   Neutro Abs 14.0 (H) 1.7 - 7.7 K/uL   Lymphocytes Relative 16 12 - 46 %   Lymphs Abs 2.8 0.7 - 4.0 K/uL   Monocytes Relative 5 3 - 12 %   Monocytes Absolute 0.9 0.1 - 1.0 K/uL   Eosinophils Relative 0 0 - 5 %   Eosinophils Absolute 0.1 0.0 - 0.7 K/uL   Basophils Relative 0 0 - 1 %   Basophils Absolute 0.0 0.0 - 0.1 K/uL  Comprehensive metabolic panel     Status: Abnormal   Collection Time: 06/02/14  5:03 PM  Result Value Ref Range   Sodium 133 (L) 135 - 145 mmol/L   Potassium 2.8 (L) 3.5 - 5.1 mmol/L   Chloride 97 96 - 112 mmol/L   CO2 24 19 - 32 mmol/L   Glucose, Bld 154 (H) 70 - 99 mg/dL   BUN 9 6 - 23 mg/dL   Creatinine, Ser 0.72 0.50 - 1.10 mg/dL   Calcium 8.7 8.4 - 10.5 mg/dL   Total Protein 7.2 6.0 - 8.3 g/dL   Albumin 3.8 3.5 - 5.2 g/dL   AST 24 0 - 37 U/L   ALT 13 0 - 35 U/L   Alkaline Phosphatase 72 39 - 117 U/L   Total Bilirubin 0.3 0.3 - 1.2 mg/dL   GFR calc non Af Amer >90 >90 mL/min   GFR calc Af Amer >90 >90 mL/min    Comment: (NOTE) The eGFR has been calculated using the CKD EPI equation. This calculation  has not been validated in all  clinical situations. eGFR's persistently <90 mL/min signify possible Chronic Kidney Disease.    Anion gap 12 5 - 15  I-Stat CG4 Lactic Acid, ED     Status: None   Collection Time: 06/02/14  5:12 PM  Result Value Ref Range   Lactic Acid, Venous 1.10 0.5 - 2.0 mmol/L  POC occult blood, ED Provider will collect     Status: Abnormal   Collection Time: 06/02/14  6:21 PM  Result Value Ref Range   Fecal Occult Bld POSITIVE (A) NEGATIVE   No results found.  Review of Systems  Constitutional: Positive for malaise/fatigue.  Respiratory: Negative.   Cardiovascular: Negative for chest pain, palpitations and leg swelling.  Gastrointestinal: Positive for nausea and abdominal pain.  Musculoskeletal: Positive for back pain and joint pain.    Blood pressure 110/88, pulse 128, resp. rate 25, SpO2 96 %. Physical Exam  General: Alert, ,thin,chronically and acutely ill-appearing Caucasian female in obvious pain who appears much older than her stated age Skin: Warm and dry without rash or infection. HEENT: No palpable masses or thyromegaly. Sclera nonicteric. Pupils equal round and reactive. Oropharynx clear. Lymph nodes: No cervical, supraclavicular, or inguinal nodes palpable. Lungs: Breath sounds clear and equal without increased work of breathing Cardiovascular: Regular Tachycardia without murmur. No JVD or edema. Peripheral pulses intact. Abdomen: Nondistended. Ostomy in place left mid abdomen. healed midline incision.  Marked diffuse tenderness and guarding. No masses palpable. No organomegaly. No palpable hernias. Extremities: Thin with some chronic joint contractures Neurologic: Alert and fully oriented. No gross motor or sensory deficits.  Assessment/Plan Long-standing Crohn's colitis with 2 previous resections with colostomy. She has had a peristomal abscess last year and now a tight stricture at the colostomy with almost certainly perforation at the stricture site today at the time of  colonoscopy and dilatation. She appears ill with diffuse tenderness and tachycardia. I believe she needs emergency laparotomy and resection of the perforation with new colostomy. I discussed this with the patient and her daughter and they understand the need and indication for the surgery. We discussed that she is at higher risk due to immunosuppression and chronic illness and also has history of significant coronary artery disease. However I do not see an alternative to laparotomy. She is receiving broad-spectrum antibiotics and will be taken immediately to the operating room.  Meghana Tullo T 06/02/2014, 6:57 PM

## 2014-06-02 NOTE — ED Notes (Signed)
Bed: WA14 Expected date:  Expected time:  Means of arrival:  Comments: EMS 

## 2014-06-02 NOTE — ED Notes (Signed)
Pt signed consent and I witness it.

## 2014-06-02 NOTE — Anesthesia Procedure Notes (Signed)
Procedure Name: Intubation Date/Time: 06/02/2014 8:09 PM Performed by: Montel Clock Pre-anesthesia Checklist: Patient identified, Emergency Drugs available, Suction available, Patient being monitored and Timeout performed Patient Re-evaluated:Patient Re-evaluated prior to inductionOxygen Delivery Method: Circle system utilized Preoxygenation: Pre-oxygenation with 100% oxygen Intubation Type: IV induction, Rapid sequence and Cricoid Pressure applied Laryngoscope Size: Mac and 3 Grade View: Grade I Tube type: Oral Tube size: 7.0 mm Number of attempts: 1 Airway Equipment and Method: Stylet Placement Confirmation: ETT inserted through vocal cords under direct vision,  positive ETCO2 and breath sounds checked- equal and bilateral Secured at: 20 cm Tube secured with: Tape Dental Injury: Teeth and Oropharynx as per pre-operative assessment

## 2014-06-02 NOTE — Anesthesia Preprocedure Evaluation (Addendum)
Anesthesia Evaluation  Patient identified by MRN, date of birth, ID band Patient awake    Reviewed: Allergy & Precautions, NPO status , Patient's Chart, lab work & pertinent test results  Airway Mallampati: II  TM Distance: >3 FB Neck ROM: Full    Dental no notable dental hx.    Pulmonary Current Smoker,  breath sounds clear to auscultation  Pulmonary exam normal       Cardiovascular Exercise Tolerance: Good + CAD + Valvular Problems/Murmurs Rhythm:Regular Rate:Normal     Neuro/Psych negative neurological ROS  negative psych ROS   GI/Hepatic Neg liver ROS, PUD,   Endo/Other  negative endocrine ROS  Renal/GU negative Renal ROS  negative genitourinary   Musculoskeletal negative musculoskeletal ROS (+) Arthritis -,   Abdominal   Peds negative pediatric ROS (+)  Hematology negative hematology ROS (+)   Anesthesia Other Findings   Reproductive/Obstetrics negative OB ROS                           Anesthesia Physical Anesthesia Plan  ASA: III and emergent  Anesthesia Plan: General   Post-op Pain Management:    Induction: Intravenous  Airway Management Planned: Oral ETT  Additional Equipment:   Intra-op Plan:   Post-operative Plan: Extubation in OR  Informed Consent: I have reviewed the patients History and Physical, chart, labs and discussed the procedure including the risks, benefits and alternatives for the proposed anesthesia with the patient or authorized representative who has indicated his/her understanding and acceptance.   Dental advisory given  Plan Discussed with: CRNA  Anesthesia Plan Comments: (Potassium repletion in progress. Plan IV steroid coverage.)       Anesthesia Quick Evaluation

## 2014-06-02 NOTE — Progress Notes (Signed)
90 [name] pain level now rated 9 out of 10 in abdominal area below stoma. Colostomy bag 1/3 full of blood tinged liquid. Patient also developed nausea and vomiting. Called ambulance for hospital transfer due to patient's condition. Prior to transfer [name]'s pain level improved to a 7 out of 10 and nausea subsided.

## 2014-06-02 NOTE — ED Notes (Signed)
Pt was having colonoscopy today and was told there was a stricter and something tore. Pt now has abd pain and N/V.

## 2014-06-02 NOTE — Progress Notes (Signed)
Called to room to assist during endoscopic procedure.  Patient ID and intended procedure confirmed with present staff. Received instructions for my participation in the procedure from the performing physician.  

## 2014-06-02 NOTE — ED Provider Notes (Signed)
CSN: [id number]     Arrival date & time 06/02/14  1610 History   First MD Initiated Contact with Patient 06/02/14 1643     Chief Complaint  Patient presents with  . GI Problem     (Consider location/radiation/quality/duration/timing/severity/associated sxs/prior Treatment) HPI Comments: Patient presents with severe left-sided abdominal pain, nausea and vomiting after colonoscopy today.  Patient sent by [facility] with concern for possible perforation.   Patient is a 59 y.o. female presenting with GI illness.  GI Problem Pertinent negatives include no chest pain, no abdominal pain, no headaches and no shortness of breath.    Past Medical History  Diagnosis Date  . Ulcerative colitis, unspecified   . Coronary atherosclerosis of unspecified type of vessel, native or graft   . Anal fistula   . Arthritis   . Personal history of colonic polyps   . Crohn's colitis   . History of colostomy   . Heart murmur   . Colitis 2015  . Hx of myocardial infarction 1986   Past Surgical History  Procedure Laterality Date  . Cholecystectomy    . Colon resection  1988    1988 she thinks  . Angioplasty      1987  . Appendectomy    . Tubal ligation  1993  . Colostomy  2012  . Irrigation and debridement abscess N/A 06/18/2013    Procedure: Irrigation and Debridiment of peristomal abcess;  Surgeon: Gayland Curry, MD;  Location: [facility];  Service: General;  Laterality: N/A;   Family History  Problem Relation Age of Onset  . Heart disease Brother   . Heart disease Mother   . Lung cancer Paternal Uncle   . Colon polyps Neg Hx   . Esophageal cancer Neg Hx   . Rectal cancer Neg Hx   . Stomach cancer Neg Hx   . Stroke Father    History  Substance Use Topics  . Smoking status: Current Every Day Smoker -- 0.50 packs/day    Types: Cigarettes  . Smokeless tobacco: Never Used     Comment: 3 cigarettes daily  . Alcohol Use: No   OB History    No data available     Review of Systems    Constitutional: Negative for fever, chills, diaphoresis, activity change, appetite change and fatigue.  HENT: Negative for congestion, facial swelling, rhinorrhea and sore throat.   Eyes: Negative for photophobia and discharge.  Respiratory: Negative for cough, chest tightness and shortness of breath.   Cardiovascular: Negative for chest pain, palpitations and leg swelling.  Gastrointestinal: Negative for nausea, vomiting, abdominal pain and diarrhea.  Endocrine: Negative for polydipsia and polyuria.  Genitourinary: Negative for dysuria, frequency, difficulty urinating and pelvic pain.  Musculoskeletal: Negative for back pain, arthralgias, neck pain and neck stiffness.  Skin: Negative for color change and wound.  Allergic/Immunologic: Negative for immunocompromised state.  Neurological: Negative for facial asymmetry, weakness, numbness and headaches.  Hematological: Does not bruise/bleed easily.  Psychiatric/Behavioral: Negative for confusion and agitation.      Allergies  Remicade  Home Medications   Prior to Admission medications   Medication Sig Start Date End Date Taking? Authorizing Provider  Adalimumab 40 MG/0.8ML PNKT Inject 0.8 mLs into the skin once a week.   Yes Historical Provider, MD  alendronate (FOSAMAX) 70 MG tablet 70 mg. Take with a full glass of water on an empty stomach. No food for 30 minutes after taking Takes on Tuesdays 12/07/12  Yes Lafayette Dragon, MD  aspirin 81 MG tablet Take 81 mg by mouth daily.     Yes Historical Provider, MD  Calcium Carbonate-Vitamin D (CALCIUM-VITAMIN D) 600-125 MG-UNIT TABS Take 1200mg  with vitamin D daily. 12/07/12  Yes Lafayette Dragon, MD  diazepam (VALIUM) 5 MG tablet TAKE 1 TABLET BY MOUTH EVERY DAY AS NEEDED 10/09/13  Yes Lafayette Dragon, MD  dicyclomine (BENTYL) 20 MG tablet Take 1 tablet by mouth twice daily as needed for crampy abdominal pain 09/04/13  Yes Lafayette Dragon, MD  ergocalciferol (VITAMIN D2) 50000 UNITS capsule Take one po  weekly x 12 weeks 05/22/13  Yes Lafayette Dragon, MD  folic acid (FOLVITE) 1 MG tablet Take 1 tablet (1 mg total) by mouth daily. 01/27/14  Yes Lafayette Dragon, MD  HYDROcodone-acetaminophen (NORCO/VICODIN) 5-325 MG per tablet Take 1 tablet by mouth every 6 (six) hours as needed for moderate pain. 05/27/14  Yes Lafayette Dragon, MD  Multiple Vitamin (MULTIVITAMIN PO) Take 1 tablet by mouth daily.    Yes Historical Provider, MD  predniSONE (DELTASONE) 10 MG tablet TAKE 1 TABLET BY MOUTH EVERY DAY AS DIRECTED 12/25/13  Yes Lafayette Dragon, MD  sulfaSALAzine (AZULFIDINE) 500 MG tablet Take 1 tablet by mouth twice daily x 1 week, then increase to 2 tablets twice daily thereafter. Pharmacy-please d/c rx for lialda Patient taking differently: Take 1,000 mg by mouth 2 (two) times daily. l 01/27/14  Yes Lafayette Dragon, MD  adalimumab (HUMIRA) 40 MG/0.8ML injection Inject 0.8 mLs (40 mg total) into the skin once a week. 05/21/13 05/21/14  Lafayette Dragon, MD  ciprofloxacin (CIPRO) 500 MG tablet Take 1 tablet (500 mg total) by mouth 2 (two) times daily. 06/02/14   Lafayette Dragon, MD   SpO2 98% Physical Exam  Constitutional: She is oriented to person, place, and time. She appears well-developed and well-nourished. No distress.  HENT:  Head: Normocephalic and atraumatic.  Mouth/Throat: No oropharyngeal exudate.  Eyes: Pupils are equal, round, and reactive to light.  Neck: Normal range of motion. Neck supple.  Cardiovascular: Normal rate, regular rhythm and normal heart sounds.  Exam reveals no gallop and no friction rub.   No murmur heard. Pulmonary/Chest: Effort normal and breath sounds normal. No respiratory distress. She has no wheezes. She has no rales.  Abdominal: Soft. Bowel sounds are normal. She exhibits no distension and no mass. There is tenderness. There is no rigidity, no rebound and no guarding.    Musculoskeletal: Normal range of motion. She exhibits no edema or tenderness.  Neurological: She is alert and  oriented to person, place, and time.  Skin: Skin is warm and dry.  Psychiatric: She has a normal mood and affect.    ED Course  Procedures (including critical care time) Labs Review Labs Reviewed  CBC WITH DIFFERENTIAL/PLATELET  COMPREHENSIVE METABOLIC PANEL  I-STAT CG4 LACTIC ACID, ED    Imaging Review No results found.   EKG Interpretation None     CRITICAL CARE Performed by: Ernestina Patches, E Total critical care time: 40 Critical care time was exclusive of separately billable procedures and treating other patients. Critical care was necessary to treat or prevent imminent or life-threatening deterioration. Critical care was time spent personally by me on the following activities: development of treatment plan with patient and/or surrogate as well as nursing, discussions with consultants, evaluation of patient's response to treatment, examination of patient, obtaining history from patient or surrogate, ordering and performing treatments and interventions, ordering and review of laboratory  studies, ordering and review of radiographic studies, pulse oximetry and re-evaluation of patient's condition.  MDM   Final diagnoses:  None    Pt is a 59 y.o. female with Pmhx as above who presents with sudden onset severe ab pain, mostly L sided, n/v, after a colonscopy. On PE, pt uncomfortable, vomiting, she has +BS, with generalized abdominal tenderness. I spoke w/ [facility] PA prior to pt's arrival who requested AAS to r/o free air at time of pt's arrival. She has blood tinged material from ostomy.   6:36PM AAS not crossing over to radiology, have been informed that canopy is down. I have viewed AAS in radiology dept in ED and is concerning for free air. Pt tachyardic, sat 89%. Will start IVF bolus, IV zosyn. CCS consulted, Dr. Excell Seltzer will see in the ED. Dr. Carlean Purl with GI has also seen pt in the ED.   6:45PM Dr. Excell Seltzer in ED, has viewed AAS in radiology dept as well. Will take pt  to the OR.      Ernestina Patches, MD 06/02/14 2241

## 2014-06-02 NOTE — Op Note (Signed)
[place]  [address] [place], [address]   COLONOSCOPY PROCEDURE REPORT  PATIENT: [name], [name]  MR#: [id number] BIRTHDATE: May 08, 1955 , 19  yrs. old GENDER: female ENDOSCOPIST: Lafayette Dragon, MD REFERRED BY:M.M.O'Sullivan PROCEDURE DATE:  06/02/2014 PROCEDURE:   Colonoscopy, diagnostic and Colonoscopy with balloon dilation First Screening Colonoscopy - Avg.  risk and is 50 yrs.  old or older - No.  Prior Negative Screening - Now for repeat screening. N/A  History of Adenoma - Now for follow-up colonoscopy & has been > or = to 3 yrs.  N/A ASA CLASS:   Class III INDICATIONS:Colorectal Neoplasm Risk Assessment for this procedure is average risk and Crohn's disease since 1986.  Colonic resection in 1996.  Colostomy in August 2010.  Has been on biologicals.  Last colonoscopy March 2011 via colostomy. MEDICATIONS: Monitored anesthesia care and Propofol 300 mg IV  DESCRIPTION OF PROCEDURE:   After the risks benefits and alternatives of the procedure were thoroughly explained, informed consent was obtained.  The digital rectal exam was not performed. The LB [id number]-H190 [id number]  endoscope was introduced through the anus and advanced to the ascending colon. No adverse events experienced. The quality of the prep was good.  (MoviPrep was used)  The instrument was then slowly withdrawn as the colon was fully examined.      COLON FINDINGS: Colostomy stoma was severely stenosed and would not allow the colonoscope to pass into the colon.  The stoma was digitally dilated .Susequently  upper endoscope  passed  through the opening into severely stenosed colon.  The mucosa was nodular and friable and rubbery.  [employer] balloon dilator was placed into the stricture and gently dilated to 15 mm, subsequently to 16 mm and at the end to 17 mm  and held there for 1 minute. There was bleeding from the mucosa.  At that time upper endoscope was reintroduced into the  lumen and was able to pass through severely inflamed segment of the colon from 0 to about 10 cm. Proximal to 10 cm , mucosa of the colon was entirely normal.  There was a large tear at the stoma at 2 cm from the opening which was somewhat worrisome.  For that reason procedure was not completed all the way to the cecum.  Video photographs all of the tear were taken.  Retroflexion was not performed. The time to cecum = 12.24 Withdrawal time = 6.10   The scope was withdrawn and the procedure completed. COMPLICATIONS: There were no immediate complications.  ENDOSCOPIC IMPRESSION: Colostomy stoma was severely stenosed and would not allow the colonoscope to pass into the colon.  The stoma was digitally dilated now for upper endoscope to pass through the opening into severely stenosed colon.  The mucosa was nodular and friable and rubbery.  [employer] balloon dilator was placed into the stricture and gently dilated to 15 mm, subsequently to 16 mm and at the end to 17 mm and held there for 1 minute.  There was bleeding from the mucosa.  At that time upper endoscope was reintroduced into the lumen and was able to pass through severely inflamed segment of the colon from 0 to about 10 cm.  At about 10 cm mucosa of the colon was entirely normal.  There was a large tear at the stoma at 2 cm from the opening which was somewhat worrisome.  For that reason procedure was not completed all the way to the cecum. 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  Active Crohn's disease Involving the stoma as well as distal colon from 0-10 cm  Incomplete exam to a descending colon. No evidence of active Crohn's disease in transverse colon and a descending colon  RECOMMENDATIONS: Patient will be on bowel rest for 24 hours obtain KUB  today to rule out the peritoneum continue current medications including biologicals Cipro 500 mg twice a day for one week  eSigned:  Lafayette Dragon, MD 06/02/2014 2:49 PM   cc:   PATIENT NAME:  Martha, [name] MR#: [id number]

## 2014-06-02 NOTE — Op Note (Signed)
Preoperative Diagnosis: Colonic perforation, Crohn's disease  Postoprative Diagnosis: Same  Procedure: Procedure(s): EXPLORATORY LAPAROTOMY/PARTIAL COLECTOMY WITH RESECTION OF COLOSTOMY And creation of new colostomy   Surgeon: Excell Seltzer T   Assistants: Armandina Gemma  Anesthesia:  General endotracheal anesthesia  Indications: Patient is a 59 year old female with a long history of Crohn's colitis with 2 previous colonic resections and end colostomy in the left colon. She has developed recurrent Crohn's disease distally with a stricture of her colostomy and attempted dilatation today by GI resulted in perforation with increasing abdominal pain generalized presenting to the ER and plain abdominal x-ray showing a large amount of free intra-abdominal air. She is tachycardic with a diffusely tender abdomen and I recommended immediate laparotomy with probable partial colectomy and creation of a new colostomy. I discussed the procedure with the patient and her daughter and the understand that she is somewhat high risk due to immunosuppression and ongoing infection and several other comorbidities. We discussed risks of anesthetic complications, cardiorespiratory complications, bleeding, infection. They understand and agree to proceed.    Procedure Detail: Patient was brought to the operating room, placed in the supine position on the operating table, and general endotracheal anesthesia induced. She had previously been given broad-spectrum IV antibiotics. Foley catheter and nasogastric tube were placed. The abdomen was widely sterilely prepped and draped.  The previous midline incision was used and dissection was carried down to 17 is tissue and midline fascia removing old suture material. The peritoneum was entered under direct vision and fortunately there were minimal adhesions anteriorly, mostly omentum which were taken down the anterior abdominal wall cleared. There was a fair amount of purulent  abdominal fluid which was suctioned and evacuated. No gross fecal contamination.The colostomy was exposed from the intra-abdominal approach and there was an obvious approximately 1 cm perforation about 2-3 cm into the abdominal cavity. The colon at this point was markedly thickened consistent with chronic Crohn's colitis. We elect to take the colostomy down. The colon was mobilized up into the anterior abdominal wall as much as possible intra-abdominally. The stoma was quite stenotic and I created a normal size circular incision around the stoma for possible using the same site and dissection was carried down through the subcutaneous Tissue dissecting the stoma away from subcutaneous Tissue down to the level of the fascia. Using cautery the colon was dissected away from the fascial edges preserving a small defect so that we could use the same side effect desired and finally dissecting down into the peritoneal cavity where the stoma could be inverted into the abdomen and final attachments taken down cautery completely taking the ostomy down from the anterior abdominal wall. The remaining left colon up toward the splenic flexure was very thickened and after mobilizing some lateral peritoneal attachments up toward the splenic flexure it was clear that with the approximately 8-10 cm of significant Crohn's disease proximal to the colostomy that I would not be able to simply mobilized the left colon enough to create a new colostomy. The transverse colon was markedly distended with air but was otherwise normal. I elected to resect at approximately the mid transverse colon. It appeared that there was an anastomosis just distal to this in the transverse colon and that likely the splenic flexure had been previously taken down. The proximal left colon and distal transverse colon are mobilized dividing lateral attachments and dividing the lesser omentum with the LigaSure device. The lesser sac was obliterated likely from  previous surgery. There were numerous small bowel  adhesions to the proximal left colon and distal transverse colon were taken down with careful sharp dissection and further interloop small bowel adhesions were lysed. The colon was further mobilized until it was free on its mesentery up to the mid transverse colon and we divided the mid transverse colon here with a single firing the GIA 75 mm stapler which appeared to be just proximal to a previous anastomosis. The mesentery of the involved segment was then divided with the LigaSure device and the specimen removed. The transverse colon was  further mobilized proximally and the hepatic flexure taken down adhesions from previous cholecystectomy mobilized so that we could bring the transverse colon over to the left side and use the same stoma site.  At this point the end of the colon could be easily brought out through the previous stoma site under no tension. The abdomen was thoroughly irrigated with multiple liters of warm saline. The viscera returned to their anatomic position. The midline fascia was closed with running looped #1 PDS begun at either end of the incision and tied centrally along with several interrupted #1 Novafil sutures. The stoma was then matured excising the staple line and everting the stoma and securing to the skin edges with interrupted 3-0 Vicryl. The wound was packed open with moist saline gauze and ostomy device placed and patient taken to recovery room. Sponge needle and instrument counts were correct.    Findings: Severe Crohn's disease involving the distal 10 cm of colon up to the ostomy with intra-abdominal perforation and peritonitis  Estimated Blood Loss:  200 mL         Drains: none  Blood Given: none          Specimens: Portion of transverse and left colon with colostomy        Complications:  * No complications entered in OR log *         Disposition: PACU - hemodynamically stable.         Condition: stable

## 2014-06-02 NOTE — Progress Notes (Signed)
A/ox3 pleased with MAC, report to Wendy RN 

## 2014-06-02 NOTE — Transfer of Care (Signed)
Immediate Anesthesia Transfer of Care Note  Patient: [name]  Procedure(s) Performed: Procedure(s) (LRB): EXPLORATORY LAPAROTOMY/PARTIAL COLECTOMY COLOSTOMY (N/A)  Patient Location: PACU  Anesthesia Type: General  Level of Consciousness: sedated, patient cooperative and responds to stimulation  Airway & Oxygen Therapy: Patient Spontanous Breathing and Patient connected to face mask oxgen  Post-op Assessment: Report given to PACU RN and Post -op Vital signs reviewed and stable  Post vital signs: Reviewed and stable  Complications: No apparent anesthesia complications

## 2014-06-02 NOTE — Progress Notes (Signed)
I have reviewed the results of the exploratory laparorotomy and appreciate all care given. Pt has had an aggressive Crohn's disease over past  25 years I have followed her. She will eventually go back on biologicals ( Humira). Currently she is steroid dependent- has been on Prednisone 10 mg and will need low dose steroid taper.  Dr Carlean Purl is our hospital GI MD and will see pt in follow in the hospital.   [name] MD [place] Pager [phone]

## 2014-06-02 NOTE — ED Notes (Signed)
Pt to xray

## 2014-06-02 NOTE — Patient Instructions (Signed)
YOU HAD AN ENDOSCOPIC PROCEDURE TODAY AT [facility]:   Refer to the procedure report that was given to you for any specific questions about what was found during the examination.  If the procedure report does not answer your questions, please call your gastroenterologist to clarify.  If you requested that your care partner not be given the details of your procedure findings, then the procedure report has been included in a sealed envelope for you to review at your convenience later.  YOU SHOULD EXPECT: Some feelings of bloating in the abdomen. Passage of more gas than usual.  Walking can help get rid of the air that was put into your GI tract during the procedure and reduce the bloating. If you had a lower endoscopy (such as a colonoscopy or flexible sigmoidoscopy) you may notice spotting of blood in your stool or on the toilet paper. If you underwent a bowel prep for your procedure, you may not have a normal bowel movement for a few days.  Please Note:  You might notice some irritation and congestion in your nose or some drainage.  This is from the oxygen used during your procedure.  There is no need for concern and it should clear up in a day or so.  SYMPTOMS TO REPORT IMMEDIATELY:   Following lower endoscopy (colonoscopy or flexible sigmoidoscopy):  Excessive amounts of blood in the stool  Significant tenderness or worsening of abdominal pains  Swelling of the abdomen that is new, acute  Fever of 100F or higher   For urgent or emergent issues, a gastroenterologist can be reached at any hour by calling [phone].   DIET: Your first meal following the procedure should be a small meal and then it is ok to progress to your normal diet. Heavy or fried foods are harder to digest and may make you feel nauseous or bloated.  Likewise, meals heavy in dairy and vegetables can increase bloating.  Drink plenty of fluids but you should avoid alcoholic beverages for 24  hours.  ACTIVITY:  You should plan to take it easy for the rest of today and you should NOT DRIVE or use heavy machinery until tomorrow (because of the sedation medicines used during the test).    FOLLOW UP: Our staff will call the number listed on your records the next business day following your procedure to check on you and address any questions or concerns that you may have regarding the information given to you following your procedure. If we do not reach you, we will leave a message.  However, if you are feeling well and you are not experiencing any problems, there is no need to return our call.  We will assume that you have returned to your regular daily activities without incident.  If any biopsies were taken you will be contacted by phone or by letter within the next 1-3 weeks.  Please call us at [phone] if you have not heard about the biopsies in 3 weeks.    SIGNATURES/CONFIDENTIALITY: You and/or your care partner have signed paperwork which will be entered into your electronic medical record.  These signatures attest to the fact that that the information above on your After Visit Summary has been reviewed and is understood.  Full responsibility of the confidentiality of this discharge information lies with you and/or your care-partner.  Recommendations Clear liquid diet for 24 hours.  KUB at [facility] post procedure. Discharge instructions provided to patient and/or care partner.

## 2014-06-03 ENCOUNTER — Encounter (HOSPITAL_COMMUNITY): Payer: Self-pay | Admitting: General Surgery

## 2014-06-03 ENCOUNTER — Telehealth: Payer: Self-pay

## 2014-06-03 DIAGNOSIS — K50118 Crohn's disease of large intestine with other complication: Secondary | ICD-10-CM

## 2014-06-03 LAB — CBC
HEMATOCRIT: 33.4 % — AB (ref 36.0–46.0)
HEMOGLOBIN: 10.7 g/dL — AB (ref 12.0–15.0)
MCH: 30.2 pg (ref 26.0–34.0)
MCHC: 32 g/dL (ref 30.0–36.0)
MCV: 94.4 fL (ref 78.0–100.0)
Platelets: 341 10*3/uL (ref 150–400)
RBC: 3.54 MIL/uL — AB (ref 3.87–5.11)
RDW: 12.6 % (ref 11.5–15.5)
WBC: 10.6 10*3/uL — ABNORMAL HIGH (ref 4.0–10.5)

## 2014-06-03 LAB — BASIC METABOLIC PANEL
Anion gap: 8 (ref 5–15)
BUN: 11 mg/dL (ref 6–23)
CALCIUM: 7.5 mg/dL — AB (ref 8.4–10.5)
CO2: 22 mmol/L (ref 19–32)
Chloride: 106 mmol/L (ref 96–112)
Creatinine, Ser: 0.88 mg/dL (ref 0.50–1.10)
GFR calc Af Amer: 82 mL/min — ABNORMAL LOW (ref >90)
GFR calc non Af Amer: 71 mL/min — ABNORMAL LOW (ref >90)
Glucose, Bld: 139 mg/dL — ABNORMAL HIGH (ref 70–99)
Potassium: 3.8 mmol/L (ref 3.5–5.1)
SODIUM: 136 mmol/L (ref 135–145)

## 2014-06-03 LAB — MRSA PCR SCREENING: MRSA by PCR: NEGATIVE

## 2014-06-03 MED ORDER — HYDROMORPHONE 0.3 MG/ML IV SOLN
INTRAVENOUS | Status: DC
  Administered 2014-06-03: 09:00:00 via INTRAVENOUS
  Administered 2014-06-03: 0.5 mg via INTRAVENOUS
  Administered 2014-06-03: 1.2 mg via INTRAVENOUS
  Administered 2014-06-03: 4.1 mg via INTRAVENOUS
  Administered 2014-06-04: 0.228 mg via INTRAVENOUS
  Administered 2014-06-04: 1.5 mg via INTRAVENOUS
  Filled 2014-06-03 (×3): qty 25

## 2014-06-03 MED ORDER — CETYLPYRIDINIUM CHLORIDE 0.05 % MT LIQD
7.0000 mL | Freq: Two times a day (BID) | OROMUCOSAL | Status: DC
  Administered 2014-06-04 (×2): 7 mL via OROMUCOSAL

## 2014-06-03 MED ORDER — CETYLPYRIDINIUM CHLORIDE 0.05 % MT LIQD
7.0000 mL | Freq: Two times a day (BID) | OROMUCOSAL | Status: DC
  Administered 2014-06-03 (×2): 7 mL via OROMUCOSAL

## 2014-06-03 MED ORDER — SODIUM CHLORIDE 0.9 % IJ SOLN: 9.0000 mL | INTRAMUSCULAR | Status: DC | PRN

## 2014-06-03 MED ORDER — CHLORHEXIDINE GLUCONATE 0.12 % MT SOLN
15.0000 mL | Freq: Two times a day (BID) | OROMUCOSAL | Status: DC
  Administered 2014-06-03 – 2014-06-05 (×4): 15 mL via OROMUCOSAL
  Filled 2014-06-03 (×4): qty 15

## 2014-06-03 MED ORDER — DIPHENHYDRAMINE HCL 50 MG/ML IJ SOLN
12.5000 mg | Freq: Four times a day (QID) | INTRAMUSCULAR | Status: DC | PRN
  Administered 2014-06-03: 12.5 mg via INTRAVENOUS
  Filled 2014-06-03: qty 1

## 2014-06-03 MED ORDER — DIPHENHYDRAMINE HCL 12.5 MG/5ML PO ELIX: 12.5000 mg | ORAL_SOLUTION | Freq: Four times a day (QID) | ORAL | Status: DC | PRN

## 2014-06-03 MED ORDER — NALOXONE HCL 0.4 MG/ML IJ SOLN: 0.4000 mg | INTRAMUSCULAR | Status: DC | PRN

## 2014-06-03 NOTE — Care Management Note (Signed)
CARE MANAGEMENT NOTE 06/03/2014  Patient:  [name], [name]   Account Number:  [id number]  Date Initiated:  06/03/2014  Documentation initiated by:  Lakeshia Dohner  Subjective/Objective Assessment:   had outpt colonscopy sustaine dperforated bowel and was taken to or for colostomy has hx of Crohn's disease     Action/Plan:   home when stable   Anticipated DC Date:  06/06/2014   Anticipated DC Plan:  HOME/SELF CARE  In-house referral  NA      DC Planning Services  CM consult      PAC Choice  NA   Choice offered to / List presented to:  NA           Status of service:  In process, will continue to follow Medicare Important Message given?   (If response is "NO", the following Medicare IM given date fields will be blank) Date Medicare IM given:   Medicare IM given by:   Date Additional Medicare IM given:   Additional Medicare IM given by:    Discharge Disposition:    Per UR Regulation:  Reviewed for med. necessity/level of care/duration of stay  If discussed at [place] of Stay Meetings, dates discussed:    Comments:  June 03, 2014/Tammara Massing L. Rosana Hoes, RN, BSN, CCM. Case Management [place] [phone] No discharge needs present of time of review.

## 2014-06-03 NOTE — Progress Notes (Signed)
     [facility] Progress Note  Subjective:   Pt admitted after colonoscopy yesterday.Pt is a 59 yo female with hx Crohns colitis with rectal involvement since 1986. Had resection of descending colon stricture in 1988; subtotal colectomy and ileostomy in 2010. Perostomal abscess was drained in April 2015. Has been on Humira 40 mg weekly and azulfidine 500 mg bid.Had colonoscopy yesterday--stoma was stenosed and dilation r performed with resultant perforation. Was transferred via EMS to ER, had surgical eval and subsequently underwent a partial colectomy and end colostomy.c/o abd pain. No N/V.  Objective:  Vital signs in last 24 hours: Temp:  [98.1 F (36.7 C)-99.6 F (37.6 C)] 99.6 F (37.6 C) (03/29 0800) Pulse Rate:  [70-135] 109 (03/29 0900) Resp:  [9-43] 26 (03/29 0908) BP: (92-182)/(52-96) 101/63 mmHg (03/29 0800) SpO2:  [94 %-100 %] 97 % (03/29 0908) Weight:  [101 lb (45.813 kg)-109 lb 12.6 oz (49.8 kg)] 109 lb 12.6 oz (49.8 kg) (03/29 0500)   General:   Alert,  Well-developed female   Heart:  Regular rate and rhythm; no murmurs Pulm;lungs clear Abdomen:  Dressing intact, abd tender Extremities:  Without edema. Neurologic:  Alert and  oriented x4;  grossly normal neurologically. Psych:  Alert and cooperative. Normal mood and affect.   Lab Results:  Recent Labs  06/02/14 1703 06/03/14 0357  WBC 17.8* 10.6*  HGB 12.7 10.7*  HCT 39.8 33.4*  PLT 502* 341     ASSESSMENT/PLAN:   59 yo female with long hx Crohns,s/p partial colectomy and end colostomy yesterday due to perforation. Pt has been steroid dependent--will need low dose steroid taper as she has been on prednisone 10 mg qd. (currently on solucortef). Will eventually go back on biologic (Humira).     LOS: 1 day   Hvozdovic, Vita Barley PA-C 06/03/2014, Pager [phone]  [facility] Attending  I have also seen and assessed the patient and agree with the advanced practitioner's assessment and plan. Agree  with current care and appreciate GSU.  Gatha Mayer, MD, [facility] [phone] (pager) 06/03/2014 4:17 PM

## 2014-06-03 NOTE — Consult Note (Addendum)
WOC ostomy consult note Stoma type/location: LLQ colostomy Stomal assessment/size: 1 and 1/2 inch round in a circumferential gully. Os at center. Peristomal assessment: intact, clear (close to wound margin) Treatment options for stomal/peristomal skin: skin barrier ring Output thin, brown effluent Ostomy pouching: 2pc., 2 and 1/4 inch with skin barrier ring Education provided: Patient taught that stoma was different and with a gully but that pouch changes wound be with the NPWT dressing changes for now.  She uses [employer] equipment currently, so I anticipate a relatively short learning curve on emptying.  WOC wound consult note Reason for Consult:Apply NPWT to midline wound Wound type:Surgical Pressure Ulcer POA: No Measurement:14.5cm x 2.5cm x 2.5cm Wound bed:red, moist Drainage (amount, consistency, odor) serous Periwound:intact Dressing procedure/placement/frequency:NPWT initiated at 13mmHg continuous negative pressure.  1 piece of black foam used to fill defect, seal achieved immediately and tolerated well.  [facility] nursing team will follow, and remain available to this patient, the nursing, surgical and medical team.  Thanks, Maudie Flakes, MSN, RN, [place], [place], [employer] [phone])

## 2014-06-03 NOTE — Telephone Encounter (Signed)
Left message on answering machine. 

## 2014-06-03 NOTE — Anesthesia Postprocedure Evaluation (Signed)
  Anesthesia Post-op Note  Patient: [name]  Procedure(s) Performed: Procedure(s) (LRB): EXPLORATORY LAPAROTOMY/PARTIAL COLECTOMY COLOSTOMY (N/A)  Patient Location: PACU  Anesthesia Type: General  Level of Consciousness: awake and alert   Airway and Oxygen Therapy: Patient Spontanous Breathing  Post-op Pain: mild  Post-op Assessment: Post-op Vital signs reviewed, Patient's Cardiovascular Status Stable, Respiratory Function Stable, Patent Airway and No signs of Nausea or vomiting  Last Vitals:  Filed Vitals:   06/03/14 0100  BP: 114/65  Pulse: 89  Temp:   Resp: 9    Post-op Vital Signs: stable   Complications: No apparent anesthesia complications

## 2014-06-03 NOTE — Progress Notes (Signed)
1 Day Post-Op partial colectomy and end colostomy Subjective: Pt having quite a bit of pain.    Objective: Vital signs in last 24 hours: Temp:  [98.1 F (36.7 C)-99.3 F (37.4 C)] 98.4 F (36.9 C) (03/29 0340) Pulse Rate:  [70-135] 105 (03/29 0600) Resp:  [9-43] 23 (03/29 0600) BP: (92-182)/(52-96) 118/78 mmHg (03/29 0600) SpO2:  [94 %-100 %] 97 % (03/29 0600) Weight:  [45.813 kg (101 lb)-49.8 kg (109 lb 12.6 oz)] 49.8 kg (109 lb 12.6 oz) (03/29 0500)   Intake/Output from previous day: 03/28 0701 - 03/29 0700 In: 3404.6 [P.O.:240; I.V.:3114.6; IV Piggyback:50] Out: 75 [Urine:365; Emesis/NG output:150; Stool:100; Blood:150] Intake/Output this shift:     General appearance: alert and cooperative GI: appropriately tender  Incision: dressing clean and intact  Lab Results:   Recent Labs  06/02/14 1703 06/03/14 0357  WBC 17.8* 10.6*  HGB 12.7 10.7*  HCT 39.8 33.4*  PLT 502* 341   BMET  Recent Labs  06/02/14 1703 06/03/14 0357  NA 133* 136  K 2.8* 3.8  CL 97 106  CO2 24 22  GLUCOSE 154* 139*  BUN 9 11  CREATININE 0.72 0.88  CALCIUM 8.7 7.5*   PT/INR No results for input(s): LABPROT, INR in the last 72 hours. ABG No results for input(s): PHART, HCO3 in the last 72 hours.  Invalid input(s): PCO2, PO2  MEDS, Scheduled . antiseptic oral rinse  7 mL Mouth Rinse BID  . heparin  5,000 Units Subcutaneous 3 times per day  . hydrocortisone sod succinate (SOLU-CORTEF) inj  20 mg Intravenous Daily  . piperacillin-tazobactam (ZOSYN)  IV  3.375 g Intravenous Q8H    Studies/Results: Dg Abd Acute W/chest  06/02/2014   CLINICAL DATA:  Abdominal pain, nausea and vomiting, bleeding after colonoscopy, evaluate for free air.  EXAM: ACUTE ABDOMEN SERIES (ABDOMEN 2 VIEW & CHEST 1 VIEW)  COMPARISON:  None.  FINDINGS: Moderate free intra-abdominal air. There is gaseous distention of transverse colon with air-fluid level. Enteric chain sutures noted in the left upper quadrant.  Small volume of air throughout normal caliber small bowel loops. Multiple surgical clips in the right upper quadrant of the abdomen. Small air-fluid level in the stomach. Bibasilar atelectasis. Cardiomediastinal contours are normal. No acute osseous abnormalities are seen.  IMPRESSION: Moderate free intra-abdominal air. Gaseous distention of transverse colon with air-fluid level.  Critical Value/emergent results were called by telephone at the time of interpretation on 06/02/2014 at 7:33 pm to Dr. Ernestina Patches , who verbally acknowledged these results.   Electronically Signed   By: Jeb Levering M.D.   On: 06/02/2014 19:34    Assessment: s/p Procedure(s): EXPLORATORY LAPAROTOMY/PARTIAL COLECTOMY COLOSTOMY Patient Active Problem List   Diagnosis Date Noted  . Perforation of colon 06/02/2014  . Abdominal pain, acute, left lower quadrant 06/17/2013  . CAD (coronary artery disease) of artery bypass graft 05/17/2013  . Osteoporosis, unspecified 05/17/2013  . RECTAL PAIN 12/01/2009  . COLITIS, ULCERATIVE NOS 07/31/2008  . CORONARY ARTERY DISEASE 05/03/2007  . ANAL FISTULA 05/03/2007  . CROHN'S DISEASE, LARGE INTESTINE 09/26/2005    Expected post op course  Plan: Continue ABX therapy due to abdominal infection from perforated colon PCA to help with pain Cont NG and foley Out of bed once pain under better control Wound RN to eval for wound vac    LOS: 1 day     .Rosario Adie, [facility], [place]   06/03/2014 8:04 AM

## 2014-06-04 ENCOUNTER — Inpatient Hospital Stay (HOSPITAL_COMMUNITY): Payer: Commercial Managed Care - HMO

## 2014-06-04 ENCOUNTER — Inpatient Hospital Stay (HOSPITAL_COMMUNITY): Payer: Commercial Managed Care - HMO | Admitting: Anesthesiology

## 2014-06-04 DIAGNOSIS — J9600 Acute respiratory failure, unspecified whether with hypoxia or hypercapnia: Secondary | ICD-10-CM

## 2014-06-04 DIAGNOSIS — J9601 Acute respiratory failure with hypoxia: Secondary | ICD-10-CM

## 2014-06-04 DIAGNOSIS — R7989 Other specified abnormal findings of blood chemistry: Secondary | ICD-10-CM

## 2014-06-04 DIAGNOSIS — R778 Other specified abnormalities of plasma proteins: Secondary | ICD-10-CM | POA: Diagnosis not present

## 2014-06-04 DIAGNOSIS — J810 Acute pulmonary edema: Secondary | ICD-10-CM

## 2014-06-04 LAB — BLOOD GAS, ARTERIAL
ACID-BASE DEFICIT: 2.4 mmol/L — AB (ref 0.0–2.0)
Bicarbonate: 23.3 mEq/L (ref 20.0–24.0)
Drawn by: 244901
FIO2: 1 %
O2 Saturation: 90.3 %
PEEP/CPAP: 5 cmH2O
Patient temperature: 100.3
RATE: 14 resp/min
TCO2: 22 mmol/L (ref 0–100)
VT: 360 mL
pCO2 arterial: 49.3 mmHg — ABNORMAL HIGH (ref 35.0–45.0)
pH, Arterial: 7.302 — ABNORMAL LOW (ref 7.350–7.450)
pO2, Arterial: 69.5 mmHg — ABNORMAL LOW (ref 80.0–100.0)

## 2014-06-04 LAB — CBC
HCT: 30 % — ABNORMAL LOW (ref 36.0–46.0)
HEMOGLOBIN: 9.6 g/dL — AB (ref 12.0–15.0)
MCH: 30.5 pg (ref 26.0–34.0)
MCHC: 32 g/dL (ref 30.0–36.0)
MCV: 95.2 fL (ref 78.0–100.0)
PLATELETS: 308 10*3/uL (ref 150–400)
RBC: 3.15 MIL/uL — AB (ref 3.87–5.11)
RDW: 13.1 % (ref 11.5–15.5)
WBC: 14.5 10*3/uL — ABNORMAL HIGH (ref 4.0–10.5)

## 2014-06-04 LAB — MAGNESIUM: Magnesium: 1.3 mg/dL — ABNORMAL LOW (ref 1.5–2.5)

## 2014-06-04 LAB — HEPATIC FUNCTION PANEL
ALK PHOS: 50 U/L (ref 39–117)
ALT: 18 U/L (ref 0–35)
AST: 62 U/L — ABNORMAL HIGH (ref 0–37)
Albumin: 2.3 g/dL — ABNORMAL LOW (ref 3.5–5.2)
Bilirubin, Direct: 0.2 mg/dL (ref 0.0–0.5)
Indirect Bilirubin: 0.5 mg/dL (ref 0.3–0.9)
TOTAL PROTEIN: 5.3 g/dL — AB (ref 6.0–8.3)
Total Bilirubin: 0.7 mg/dL (ref 0.3–1.2)

## 2014-06-04 LAB — CBC WITH DIFFERENTIAL/PLATELET
Basophils Absolute: 0 10*3/uL (ref 0.0–0.1)
Basophils Relative: 0 % (ref 0–1)
Eosinophils Absolute: 0 10*3/uL (ref 0.0–0.7)
Eosinophils Relative: 0 % (ref 0–5)
HCT: 34.6 % — ABNORMAL LOW (ref 36.0–46.0)
Hemoglobin: 10.8 g/dL — ABNORMAL LOW (ref 12.0–15.0)
Lymphocytes Relative: 5 % — ABNORMAL LOW (ref 12–46)
Lymphs Abs: 0.7 10*3/uL (ref 0.7–4.0)
MCH: 30 pg (ref 26.0–34.0)
MCHC: 31.2 g/dL (ref 30.0–36.0)
MCV: 96.1 fL (ref 78.0–100.0)
MONOS PCT: 3 % (ref 3–12)
Monocytes Absolute: 0.5 10*3/uL (ref 0.1–1.0)
NEUTROS ABS: 14 10*3/uL — AB (ref 1.7–7.7)
NEUTROS PCT: 92 % — AB (ref 43–77)
Platelets: 310 10*3/uL (ref 150–400)
RBC: 3.6 MIL/uL — AB (ref 3.87–5.11)
RDW: 13.2 % (ref 11.5–15.5)
WBC: 15.2 10*3/uL — AB (ref 4.0–10.5)

## 2014-06-04 LAB — GLUCOSE, CAPILLARY
GLUCOSE-CAPILLARY: 119 mg/dL — AB (ref 70–99)
GLUCOSE-CAPILLARY: 163 mg/dL — AB (ref 70–99)
Glucose-Capillary: 235 mg/dL — ABNORMAL HIGH (ref 70–99)

## 2014-06-04 LAB — BASIC METABOLIC PANEL
Anion gap: 6 (ref 5–15)
Anion gap: 6 (ref 5–15)
BUN: 10 mg/dL (ref 6–23)
BUN: 9 mg/dL (ref 6–23)
CALCIUM: 7.2 mg/dL — AB (ref 8.4–10.5)
CHLORIDE: 107 mmol/L (ref 96–112)
CO2: 24 mmol/L (ref 19–32)
CO2: 24 mmol/L (ref 19–32)
CREATININE: 0.93 mg/dL (ref 0.50–1.10)
Calcium: 7.5 mg/dL — ABNORMAL LOW (ref 8.4–10.5)
Chloride: 108 mmol/L (ref 96–112)
Creatinine, Ser: 0.87 mg/dL (ref 0.50–1.10)
GFR calc Af Amer: 83 mL/min — ABNORMAL LOW (ref >90)
GFR calc non Af Amer: 72 mL/min — ABNORMAL LOW (ref >90)
GFR, EST AFRICAN AMERICAN: 76 mL/min — AB (ref >90)
GFR, EST NON AFRICAN AMERICAN: 66 mL/min — AB (ref >90)
GLUCOSE: 109 mg/dL — AB (ref 70–99)
GLUCOSE: 103 mg/dL — AB (ref 70–99)
Potassium: 3.9 mmol/L (ref 3.5–5.1)
Potassium: 3.6 mmol/L (ref 3.5–5.1)
SODIUM: 137 mmol/L (ref 135–145)
Sodium: 138 mmol/L (ref 135–145)

## 2014-06-04 LAB — TROPONIN I
TROPONIN I: 0.12 ng/mL — AB (ref <0.031)
TROPONIN I: 0.18 ng/mL — AB (ref <0.031)
TROPONIN I: 0.22 ng/mL — AB (ref <0.031)
Troponin I: 0.31 ng/mL — ABNORMAL HIGH (ref <0.031)

## 2014-06-04 LAB — PROCALCITONIN: PROCALCITONIN: 12.31 ng/mL

## 2014-06-04 LAB — HEPARIN LEVEL (UNFRACTIONATED): Heparin Unfractionated: 0.1 IU/mL — ABNORMAL LOW (ref 0.30–0.70)

## 2014-06-04 LAB — APTT: APTT: 42 s — AB (ref 24–37)

## 2014-06-04 LAB — PHOSPHORUS: Phosphorus: 2 mg/dL — ABNORMAL LOW (ref 2.3–4.6)

## 2014-06-04 LAB — BRAIN NATRIURETIC PEPTIDE
B NATRIURETIC PEPTIDE 5: 230.4 pg/mL — AB (ref 0.0–100.0)
B Natriuretic Peptide: 409.2 pg/mL — ABNORMAL HIGH (ref 0.0–100.0)

## 2014-06-04 LAB — PROTIME-INR
INR: 1.17 (ref 0.00–1.49)
Prothrombin Time: 15 seconds (ref 11.6–15.2)

## 2014-06-04 LAB — LACTIC ACID, PLASMA: LACTIC ACID, VENOUS: 1.2 mmol/L (ref 0.5–2.0)

## 2014-06-04 LAB — CK TOTAL AND CKMB (NOT AT ARMC)
CK TOTAL: 1246 U/L — AB (ref 7–177)
CK, MB: 18.7 ng/mL (ref 0.3–4.0)
RELATIVE INDEX: 1.5 (ref 0.0–2.5)

## 2014-06-04 MED ORDER — FENTANYL CITRATE 0.05 MG/ML IJ SOLN
50.0000 ug | INTRAMUSCULAR | Status: DC | PRN
  Administered 2014-06-04: 100 ug via INTRAVENOUS
  Administered 2014-06-04: 09:00:00 via INTRAVENOUS

## 2014-06-04 MED ORDER — VANCOMYCIN HCL IN DEXTROSE 1-5 GM/200ML-% IV SOLN
1000.0000 mg | INTRAVENOUS | Status: DC
  Administered 2014-06-04: 1000 mg via INTRAVENOUS

## 2014-06-04 MED ORDER — FENTANYL CITRATE 0.05 MG/ML IJ SOLN: 50.0000 ug | Freq: Once | INTRAMUSCULAR | Status: AC

## 2014-06-04 MED ORDER — DEXTROSE 5 % IV SOLN
30.0000 ug/min | INTRAVENOUS | Status: DC
  Administered 2014-06-05: 75 ug/min via INTRAVENOUS
  Administered 2014-06-05 (×2): 40 ug/min via INTRAVENOUS
  Administered 2014-06-06: 10 ug/min via INTRAVENOUS
  Filled 2014-06-04 (×3): qty 4

## 2014-06-04 MED ORDER — PANTOPRAZOLE SODIUM 40 MG IV SOLR
40.0000 mg | INTRAVENOUS | Status: DC
  Administered 2014-06-04 – 2014-06-16 (×13): 40 mg via INTRAVENOUS
  Filled 2014-06-04 (×13): qty 40

## 2014-06-04 MED ORDER — LIDOCAINE HCL (CARDIAC) 20 MG/ML IV SOLN
INTRAVENOUS | Status: AC
  Filled 2014-06-04: qty 5

## 2014-06-04 MED ORDER — ROCURONIUM BROMIDE 50 MG/5ML IV SOLN
INTRAVENOUS | Status: AC
  Filled 2014-06-04: qty 2

## 2014-06-04 MED ORDER — METOPROLOL TARTRATE 1 MG/ML IV SOLN
5.0000 mg | Freq: Four times a day (QID) | INTRAVENOUS | Status: DC | PRN
  Administered 2014-06-13: 5 mg via INTRAVENOUS
  Filled 2014-06-04: qty 5

## 2014-06-04 MED ORDER — DEXTROSE 5 % IV SOLN
30.0000 ug/min | INTRAVENOUS | Status: DC
  Filled 2014-06-04: qty 1

## 2014-06-04 MED ORDER — FENTANYL CITRATE 0.05 MG/ML IJ SOLN
100.0000 ug | INTRAMUSCULAR | Status: DC | PRN
  Administered 2014-06-04 (×2): 100 ug via INTRAVENOUS
  Filled 2014-06-04 (×3): qty 2

## 2014-06-04 MED ORDER — IPRATROPIUM BROMIDE 0.02 % IN SOLN
0.5000 mg | Freq: Four times a day (QID) | RESPIRATORY_TRACT | Status: DC | PRN
Start: 2014-06-04 — End: 2014-06-17
  Filled 2014-06-04: qty 2.5

## 2014-06-04 MED ORDER — FENTANYL BOLUS VIA INFUSION
50.0000 ug | INTRAVENOUS | Status: DC | PRN
Start: 2014-06-04 — End: 2014-06-05
  Filled 2014-06-04: qty 50

## 2014-06-04 MED ORDER — POTASSIUM PHOSPHATES 15 MMOLE/5ML IV SOLN
10.0000 mmol | Freq: Once | INTRAVENOUS | Status: AC
  Administered 2014-06-04: 10 mmol via INTRAVENOUS
  Filled 2014-06-04: qty 3.33

## 2014-06-04 MED ORDER — MAGNESIUM SULFATE 4 GM/100ML IV SOLN
4.0000 g | Freq: Once | INTRAVENOUS | Status: AC
  Administered 2014-06-04: 4 g via INTRAVENOUS
  Filled 2014-06-04: qty 100

## 2014-06-04 MED ORDER — HEPARIN (PORCINE) IN NACL 100-0.45 UNIT/ML-% IJ SOLN
800.0000 [IU]/h | INTRAMUSCULAR | Status: DC
  Filled 2014-06-04: qty 250

## 2014-06-04 MED ORDER — ACETAMINOPHEN 650 MG RE SUPP: 650.0000 mg | RECTAL | Status: DC | PRN

## 2014-06-04 MED ORDER — MIDAZOLAM HCL 2 MG/2ML IJ SOLN
2.0000 mg | INTRAMUSCULAR | Status: DC | PRN
  Administered 2014-06-04: 2 mg via INTRAVENOUS
  Filled 2014-06-04 (×2): qty 2

## 2014-06-04 MED ORDER — PROPOFOL 10 MG/ML IV EMUL
0.0000 ug/kg/min | INTRAVENOUS | Status: DC
  Administered 2014-06-04: 10 ug/kg/min via INTRAVENOUS
  Administered 2014-06-05 (×2): 45 ug/kg/min via INTRAVENOUS
  Filled 2014-06-04 (×3): qty 100

## 2014-06-04 MED ORDER — HEPARIN (PORCINE) IN NACL 100-0.45 UNIT/ML-% IJ SOLN
600.0000 [IU]/h | INTRAMUSCULAR | Status: DC
  Administered 2014-06-04: 600 [IU]/h via INTRAVENOUS
  Filled 2014-06-04: qty 250

## 2014-06-04 MED ORDER — SUCCINYLCHOLINE CHLORIDE 20 MG/ML IJ SOLN
INTRAMUSCULAR | Status: DC | PRN
  Administered 2014-06-04: 40 mg via INTRAVENOUS

## 2014-06-04 MED ORDER — ETOMIDATE 2 MG/ML IV SOLN
INTRAVENOUS | Status: AC
  Filled 2014-06-04: qty 20

## 2014-06-04 MED ORDER — INSULIN ASPART 100 UNIT/ML ~~LOC~~ SOLN
2.0000 [IU] | SUBCUTANEOUS | Status: DC
Start: 2014-06-04 — End: 2014-06-18
  Administered 2014-06-04: 6 [IU] via SUBCUTANEOUS
  Administered 2014-06-04 (×2): 4 [IU] via SUBCUTANEOUS
  Administered 2014-06-05 (×2): 2 [IU] via SUBCUTANEOUS
  Administered 2014-06-05: 4 [IU] via SUBCUTANEOUS
  Administered 2014-06-06: 2 [IU] via SUBCUTANEOUS
  Administered 2014-06-06: 4 [IU] via SUBCUTANEOUS
  Administered 2014-06-08 – 2014-06-13 (×12): 2 [IU] via SUBCUTANEOUS
  Administered 2014-06-13: 4 [IU] via SUBCUTANEOUS
  Administered 2014-06-14 – 2014-06-16 (×10): 2 [IU] via SUBCUTANEOUS
  Administered 2014-06-16: 4 [IU] via SUBCUTANEOUS
  Administered 2014-06-16 – 2014-06-17 (×3): 2 [IU] via SUBCUTANEOUS

## 2014-06-04 MED ORDER — FUROSEMIDE 10 MG/ML IJ SOLN
INTRAMUSCULAR | Status: AC
  Administered 2014-06-04: 09:00:00
  Filled 2014-06-04: qty 2

## 2014-06-04 MED ORDER — SODIUM CHLORIDE 0.9 % IV SOLN
25.0000 ug/h | INTRAVENOUS | Status: DC
  Administered 2014-06-04: 50 ug/h via INTRAVENOUS
  Administered 2014-06-05: 300 ug/h via INTRAVENOUS
  Administered 2014-06-05: 350 ug/h via INTRAVENOUS
  Filled 2014-06-04 (×3): qty 50

## 2014-06-04 MED ORDER — DEXTROSE 5 % IV SOLN
30.0000 ug/min | INTRAVENOUS | Status: DC
  Administered 2014-06-04 (×2): 45 ug/min via INTRAVENOUS
  Administered 2014-06-04: 10 ug/min via INTRAVENOUS
  Administered 2014-06-04: 50 ug/min via INTRAVENOUS
  Filled 2014-06-04 (×4): qty 1

## 2014-06-04 MED ORDER — PROPOFOL 10 MG/ML IV BOLUS
INTRAVENOUS | Status: AC
  Filled 2014-06-04: qty 20

## 2014-06-04 MED ORDER — ALBUTEROL SULFATE (2.5 MG/3ML) 0.083% IN NEBU
2.5000 mg | INHALATION_SOLUTION | Freq: Four times a day (QID) | RESPIRATORY_TRACT | Status: DC | PRN
  Filled 2014-06-04: qty 3

## 2014-06-04 MED ORDER — HYDROCORTISONE NA SUCCINATE PF 100 MG IJ SOLR
100.0000 mg | Freq: Two times a day (BID) | INTRAMUSCULAR | Status: DC
  Administered 2014-06-04 – 2014-06-08 (×9): 100 mg via INTRAVENOUS
  Filled 2014-06-04 (×10): qty 2

## 2014-06-04 MED ORDER — SUCCINYLCHOLINE CHLORIDE 20 MG/ML IJ SOLN
INTRAMUSCULAR | Status: AC
  Filled 2014-06-04: qty 1

## 2014-06-04 MED ORDER — PROPOFOL 10 MG/ML IV BOLUS
INTRAVENOUS | Status: DC | PRN
  Administered 2014-06-04: 60 mg via INTRAVENOUS

## 2014-06-04 MED ORDER — MIDAZOLAM HCL 2 MG/2ML IJ SOLN
2.0000 mg | INTRAMUSCULAR | Status: DC | PRN
Start: 2014-06-04 — End: 2014-06-05
  Administered 2014-06-04 (×2): 2 mg via INTRAVENOUS
  Filled 2014-06-04: qty 2

## 2014-06-04 MED ORDER — FENTANYL CITRATE 0.05 MG/ML IJ SOLN
INTRAMUSCULAR | Status: AC
  Filled 2014-06-04: qty 2

## 2014-06-04 MED FILL — Medication: Qty: 1 | Status: AC

## 2014-06-04 NOTE — Progress Notes (Signed)
Patient ID: [name], female   DOB: 05-23-1955, 59 y.o.   MRN: [id number]     [facility]      [address] [place], [place] [address]    Phone: [phone] FAX: [phone]     Subjective: Arrived to room, pt in respiratory distress.  Sats 60s on NRB.  CXR worse.  Pt alert and awake.  Dsypnic.  tachcyardic in 140s.  BP stable.  Fevers, on zosyn for perforation.   Objective:  Vital signs:  Filed Vitals:   06/04/14 0720 06/04/14 0730 06/04/14 0745 06/04/14 0800  BP:      Pulse: 128 136 135   Temp:    100.3 F (37.9 C)  TempSrc:    Oral  Resp: $Remo'20 27 26   'cNKqz$ Height:      Weight:      SpO2: 88% 87% 91%     Last BM Date: 06/01/14  Intake/Output   Yesterday:  03/29 0701 - 03/30 0700 In: 3050 [I.V.:3000; IV Piggyback:50] Out: 825 [Urine:825] This shift:  Total I/O In: 93.8 [I.V.:93.8] Out: -    Physical Exam: General: Pt awake/alert/oriented x4 in acute distress Chest: absent RML and RLL.  Diminished.  Dyspnea.  Poor chest excursion.   CV:  S1S2 regular, tachycardic.  2/2 pulses.  No edema.  Abdomen: Soft.  Nondistended. Mildly tender at incisions only. Midline wound, VAC.  Stoma is pink and viable, liquid stool after the patient was intubated.  No evidence of peritonitis.  No incarcerated hernias. Ext:  SCDs BLE.  No mjr edema.  No cyanosis Skin: No petechiae / purpura   Problem List:   Active Problems:   Perforation of colon    Results:   Labs: Results for orders placed or performed during the hospital encounter of 06/02/14 (from the past 48 hour(s))  CBC with Differential/Platelet     Status: Abnormal   Collection Time: 06/02/14  5:03 PM  Result Value Ref Range   WBC 17.8 (H) 4.0 - 10.5 K/uL   RBC 4.25 3.87 - 5.11 MIL/uL   Hemoglobin 12.7 12.0 - 15.0 g/dL   HCT 39.8 36.0 - 46.0 %   MCV 93.6 78.0 - 100.0 fL   MCH 29.9 26.0 - 34.0 pg   MCHC 31.9 30.0 - 36.0 g/dL   RDW 12.8 11.5 - 15.5 %   Platelets  502 (H) 150 - 400 K/uL   Neutrophils Relative % 79 (H) 43 - 77 %   Neutro Abs 14.0 (H) 1.7 - 7.7 K/uL   Lymphocytes Relative 16 12 - 46 %   Lymphs Abs 2.8 0.7 - 4.0 K/uL   Monocytes Relative 5 3 - 12 %   Monocytes Absolute 0.9 0.1 - 1.0 K/uL   Eosinophils Relative 0 0 - 5 %   Eosinophils Absolute 0.1 0.0 - 0.7 K/uL   Basophils Relative 0 0 - 1 %   Basophils Absolute 0.0 0.0 - 0.1 K/uL  Comprehensive metabolic panel     Status: Abnormal   Collection Time: 06/02/14  5:03 PM  Result Value Ref Range   Sodium 133 (L) 135 - 145 mmol/L   Potassium 2.8 (L) 3.5 - 5.1 mmol/L   Chloride 97 96 - 112 mmol/L   CO2 24 19 - 32 mmol/L   Glucose, Bld 154 (H) 70 - 99 mg/dL   BUN 9 6 - 23 mg/dL   Creatinine, Ser 0.72 0.50 - 1.10 mg/dL   Calcium 8.7 8.4 -  10.5 mg/dL   Total Protein 7.2 6.0 - 8.3 g/dL   Albumin 3.8 3.5 - 5.2 g/dL   AST 24 0 - 37 U/L   ALT 13 0 - 35 U/L   Alkaline Phosphatase 72 39 - 117 U/L   Total Bilirubin 0.3 0.3 - 1.2 mg/dL   GFR calc non Af Amer >90 >90 mL/min   GFR calc Af Amer >90 >90 mL/min    Comment: (NOTE) The eGFR has been calculated using the CKD EPI equation. This calculation has not been validated in all clinical situations. eGFR's persistently <90 mL/min signify possible Chronic Kidney Disease.    Anion gap 12 5 - 15  I-Stat CG4 Lactic Acid, ED     Status: None   Collection Time: 06/02/14  5:12 PM  Result Value Ref Range   Lactic Acid, Venous 1.10 0.5 - 2.0 mmol/L  POC occult blood, ED Provider will collect     Status: Abnormal   Collection Time: 06/02/14  6:21 PM  Result Value Ref Range   Fecal Occult Bld POSITIVE (A) NEGATIVE  Type and screen     Status: None   Collection Time: 06/02/14  7:40 PM  Result Value Ref Range   ABO/RH(D) O POS    Antibody Screen NEG    Sample Expiration 06/05/2014   ABO/Rh     Status: None   Collection Time: 06/02/14  7:40 PM  Result Value Ref Range   ABO/RH(D) O POS   MRSA PCR Screening     Status: None   Collection  Time: 06/02/14 11:50 PM  Result Value Ref Range   MRSA by PCR NEGATIVE NEGATIVE    Comment:        The GeneXpert MRSA Assay (FDA approved for NASAL specimens only), is one component of a comprehensive MRSA colonization surveillance program. It is not intended to diagnose MRSA infection nor to guide or monitor treatment for MRSA infections.   CBC     Status: Abnormal   Collection Time: 06/03/14  3:57 AM  Result Value Ref Range   WBC 10.6 (H) 4.0 - 10.5 K/uL   RBC 3.54 (L) 3.87 - 5.11 MIL/uL   Hemoglobin 10.7 (L) 12.0 - 15.0 g/dL   HCT 33.4 (L) 36.0 - 46.0 %   MCV 94.4 78.0 - 100.0 fL   MCH 30.2 26.0 - 34.0 pg   MCHC 32.0 30.0 - 36.0 g/dL   RDW 12.6 11.5 - 15.5 %   Platelets 341 150 - 400 K/uL    Comment: DELTA CHECK NOTED SPECIMEN CHECKED FOR CLOTS REPEATED TO VERIFY PLATELET COUNT CONFIRMED BY SMEAR   Basic metabolic panel     Status: Abnormal   Collection Time: 06/03/14  3:57 AM  Result Value Ref Range   Sodium 136 135 - 145 mmol/L   Potassium 3.8 3.5 - 5.1 mmol/L    Comment: DELTA CHECK NOTED REPEATED TO VERIFY    Chloride 106 96 - 112 mmol/L    Comment: DELTA CHECK NOTED REPEATED TO VERIFY    CO2 22 19 - 32 mmol/L   Glucose, Bld 139 (H) 70 - 99 mg/dL   BUN 11 6 - 23 mg/dL   Creatinine, Ser 0.88 0.50 - 1.10 mg/dL   Calcium 7.5 (L) 8.4 - 10.5 mg/dL   GFR calc non Af Amer 71 (L) >90 mL/min   GFR calc Af Amer 82 (L) >90 mL/min    Comment: (NOTE) The eGFR has been calculated using the CKD EPI equation. This calculation  has not been validated in all clinical situations. eGFR's persistently <90 mL/min signify possible Chronic Kidney Disease.    Anion gap 8 5 - 15  CBC     Status: Abnormal   Collection Time: 06/04/14  3:35 AM  Result Value Ref Range   WBC 14.5 (H) 4.0 - 10.5 K/uL   RBC 3.15 (L) 3.87 - 5.11 MIL/uL   Hemoglobin 9.6 (L) 12.0 - 15.0 g/dL   HCT 30.0 (L) 36.0 - 46.0 %   MCV 95.2 78.0 - 100.0 fL   MCH 30.5 26.0 - 34.0 pg   MCHC 32.0 30.0 - 36.0  g/dL   RDW 13.1 11.5 - 15.5 %   Platelets 308 150 - 400 K/uL  Basic metabolic panel     Status: Abnormal   Collection Time: 06/04/14  3:35 AM  Result Value Ref Range   Sodium 138 135 - 145 mmol/L   Potassium 3.9 3.5 - 5.1 mmol/L   Chloride 108 96 - 112 mmol/L   CO2 24 19 - 32 mmol/L   Glucose, Bld 109 (H) 70 - 99 mg/dL   BUN 10 6 - 23 mg/dL   Creatinine, Ser 0.87 0.50 - 1.10 mg/dL   Calcium 7.5 (L) 8.4 - 10.5 mg/dL   GFR calc non Af Amer 72 (L) >90 mL/min   GFR calc Af Amer 83 (L) >90 mL/min    Comment: (NOTE) The eGFR has been calculated using the CKD EPI equation. This calculation has not been validated in all clinical situations. eGFR's persistently <90 mL/min signify possible Chronic Kidney Disease.    Anion gap 6 5 - 15    Imaging / Studies: Dg Chest Port 1 View  06/04/2014   CLINICAL DATA:  Shortness of Breath  EXAM: PORTABLE CHEST - 1 VIEW  COMPARISON:  June 02, 2014  FINDINGS: There is now extensive patchy airspace consolidation bilaterally. There is also consolidation in portions of the right middle and lower lobes. The heart size is normal. The pulmonary vascular is normal.  The pneumoperitoneum seen 1 day prior is not appreciable currently. There are surgical clips in the upper abdomen. Nasogastric tube tip and side port are in the stomach.  IMPRESSION: Extensive airspace consolidation bilaterally. Volume loss and significant portions of the right middle and lower lobes. Obstruction of the right bronchus intermedius is questioned. This finding may warrant bronchoscopy. Differential considerations for the current findings include pneumonia, aspiration, or possibly noncardiogenic edema. More than 1 of these entities may exist concurrently. Nasogastric tube tip and side port in stomach.   Electronically Signed   By: Lowella Grip III M.D.   On: 06/04/2014 08:30   Dg Abd Acute W/chest  06/02/2014   CLINICAL DATA:  Abdominal pain, nausea and vomiting, bleeding after  colonoscopy, evaluate for free air.  EXAM: ACUTE ABDOMEN SERIES (ABDOMEN 2 VIEW & CHEST 1 VIEW)  COMPARISON:  None.  FINDINGS: Moderate free intra-abdominal air. There is gaseous distention of transverse colon with air-fluid level. Enteric chain sutures noted in the left upper quadrant. Small volume of air throughout normal caliber small bowel loops. Multiple surgical clips in the right upper quadrant of the abdomen. Small air-fluid level in the stomach. Bibasilar atelectasis. Cardiomediastinal contours are normal. No acute osseous abnormalities are seen.  IMPRESSION: Moderate free intra-abdominal air. Gaseous distention of transverse colon with air-fluid level.  Critical Value/emergent results were called by telephone at the time of interpretation on 06/02/2014 at 7:33 pm to Dr. Ernestina Patches , who verbally acknowledged these  results.   Electronically Signed   By: Jeb Levering M.D.   On: 06/02/2014 19:34    Medications / Allergies:  Scheduled Meds: . antiseptic oral rinse  7 mL Mouth Rinse q12n4p  . chlorhexidine  15 mL Mouth Rinse BID  . heparin  5,000 Units Subcutaneous 3 times per day  . hydrocortisone sod succinate (SOLU-CORTEF) inj  20 mg Intravenous Daily  . HYDROmorphone PCA 0.3 mg/mL   Intravenous 6 times per day  . piperacillin-tazobactam (ZOSYN)  IV  3.375 g Intravenous Q8H   Continuous Infusions: . dextrose 5 % and 0.9 % NaCl with KCl 20 mEq/L 125 mL/hr at 06/04/14 0603   PRN Meds:.albuterol, diphenhydrAMINE **OR** diphenhydrAMINE, ipratropium, naloxone **AND** sodium chloride, ondansetron **OR** ondansetron (ZOFRAN) IV  Antibiotics: Anti-infectives    Start     Dose/Rate Route Frequency Ordered Stop   06/03/14 0200  piperacillin-tazobactam (ZOSYN) IVPB 3.375 g     3.375 g 12.5 mL/hr over 240 Minutes Intravenous Every 8 hours 06/02/14 2346     06/02/14 1845  piperacillin-tazobactam (ZOSYN) IVPB 3.375 g     3.375 g 12.5 mL/hr over 240 Minutes Intravenous  Once 06/02/14 1833  06/02/14 2257        Assessment/Plan Crohn's disease, colon perforation POD#2 exploratory laparotomy/partial colectomy colostomy---Dr. Excell Seltzer  -has ostomy function -wound VAC -IVF -insert foley -continue NGT Respiratory distress -given lasix $RemoveBefo'20mg'[id number]$  -anesthesia called and the patient was intubated -CCM consulted -ABGs, EKG, CKMB/troponin, BNP -further management per CCM, appreciate assistance   Erby Pian, ANP-BC [facility] Pager [phone](7A-4:30P)   06/04/2014 8:54 AM

## 2014-06-04 NOTE — Progress Notes (Signed)
eLink Physician-Brief Progress Note Patient Name: [name] DOB: December 17, 1955 MRN: [id number]   Date of Service  06/04/2014  HPI/Events of Note  Elevated troponin  eICU Interventions  Troponin slowly trending up. Continue with heparin htt Consult cardiology     Intervention Category Intermediate Interventions: Electrolyte abnormality - evaluation and management  Cayleb Jarnigan 06/04/2014, 6:40 PM

## 2014-06-04 NOTE — Anesthesia Procedure Notes (Signed)
Procedures ICU intubation Meds Propafol 60 mg / succinylcholine 40 mg iv grade 2 view /+ETCO2 [id number]/ P [id number] crna

## 2014-06-04 NOTE — Progress Notes (Addendum)
Consulted with DR. Mungal, Elink regarding her sedation . Currently she is on 270mcg/hr of Fentanyl; supplemented her with 2mg  of Versed for restlessness. She is still breathing 10 over her set rate of 14, however she looks comfortable and is synchronous with the vent. He is OK with this regimen.

## 2014-06-04 NOTE — Procedures (Signed)
Central Venous Catheter Insertion Procedure Note NALIYA GISH [id number] 1955/03/11  Procedure: Insertion of Central Venous Catheter Indications: Assessment of intravascular volume, Drug and/or fluid administration and Frequent blood sampling  Procedure Details Consent: Risks of procedure as well as the alternatives and risks of each were explained to the (patient/caregiver).  Consent for procedure obtained.   Time Out: Verified patient identification, verified procedure, site/side was marked, verified correct patient position, special equipment/implants available, medications/allergies/relevent history reviewed, required imaging and test results available.  Performed  Maximum sterile technique was used including antiseptics, cap, gloves, gown, hand hygiene, mask and sheet. Skin prep: Chlorhexidine; local anesthetic administered A triple lumen catheter was placed in the left internal jugular vein to 18 cm using the Seldinger technique, sutured x3.   Evaluation Blood flow good Complications: No apparent complications Patient did tolerate procedure well. Chest X-ray ordered to verify placement.  CXR: pending.    Procedure performed under direct supervision of Dr. Chase Caller and with ultrasound guidance for real time vessel cannulation.      Noe Gens, NP-C [facility] Pgr: [phone] or [phone]   06/04/2014, 1:00 PM

## 2014-06-04 NOTE — Progress Notes (Signed)
ANTICOAGULATION CONSULT NOTE - Initial Consult  Pharmacy Consult for Heparin Indication: chest pain/ACS  Allergies  Allergen Reactions  . Remicade [Infliximab]     convulsions    Patient Measurements: Height: 5' (152.4 cm) Weight: 109 lb 12.6 oz (49.8 kg) IBW/kg (Calculated) : 45.5 Heparin Dosing Weight: actual body weight  Vital Signs: Temp: 102.2 F (39 C) (03/30 1121) Temp Source: Oral (03/30 1121) BP: 124/54 mmHg (03/30 1100) Pulse Rate: 109 (03/30 1121)  Labs:  Recent Labs  06/03/14 0357 06/04/14 0335 06/04/14 0909 06/04/14 1030  HGB 10.7* 9.6*  --  10.8*  HCT 33.4* 30.0*  --  34.6*  PLT 341 308  --  310  LABPROT  --   --   --  15.0  INR  --   --   --  1.17  CREATININE 0.88 0.87  --  0.93  TROPONINI  --   --  0.12* 0.18*    Estimated Creatinine Clearance: 46.8 mL/min (by C-G formula based on Cr of 0.93).   Medical History: Past Medical History  Diagnosis Date  . Coronary atherosclerosis of unspecified type of vessel, native or graft   . Anal fistula   . Arthritis   . Personal history of colonic polyps   . Crohn's colitis   . History of colostomy   . Heart murmur   . Hx of myocardial infarction 1986      Assessment: 32 yoF with h/o MI in 24s s/p admitted with colonic bowel perforation.  S/p ex lap with partial colectomy 3/28.  In ICU with postop respiratory failure.  Now considering possible NSTEMI. Pharmacy consulted to start empiric IV heparin with instructions to aim for levels at low end of therapeutic range.  Troponins this AM so far: 0.12 > 0.18  Renal: SCr 0.93, CrCl~46 ml/min CBC: Hgb 10.8, platelets 310 Baseline anticoags: INR 1.17, aPTT baseline ordered STAT  Goal of Therapy:  Heparin level 0.3-0.7 units/ml (will aim towards obtaining 0.3-0.5 units/ml per MD instruction) Monitor platelets by anticoagulation protocol: Yes   Plan:  1.  Begin heparin infusion at 600 units/hr (6 ml/hr).  Chose not to bolus given recent surgery and  patient also received a dose of SQ heparin 5000 units this AM at ~6am. 2.  Check heparin level in 6 hours. 3.  F/u daily HL and CBC while on heparin.   Hershal Coria 06/04/2014,12:35 PM

## 2014-06-04 NOTE — Progress Notes (Signed)
  Echocardiogram 2D Echocardiogram has been performed.  [name] 06/04/2014, 3:57 PM

## 2014-06-04 NOTE — Consult Note (Addendum)
Admit date: 06/02/2014 Referring Physician  Dr.  Stevenson Clinch Primary Cardiologist  None Reason for Consultation  Acute respiratory failure with elevated troponin  HPI: This is a 59 yo WF with a history of ASCAD many years ago with PCI in 1987 and has not followed up with Cardiology.  She has a history of Crohn's disease with 2 prior partial colectomies and left sided ostomy.  She was found to have a stricture near her ostomy on colonoscopy on 3/28 with possible tear of the colon at the time of colonoscopy and dilatation and she presented with worsening diffuse abdominal pain since the procedure.  Abdominal and chest films showed free air.  She was taken emergently to the OR and underwent exploratory lap with partial colectomy and resection of colostomy with creation of a new colostomy.  This am she developed acute respiratory failure requiring emergent intubation for pulmonary edema.  She apparently had pink frothy sputum was noted from ET tube post extubation.  Chest xray initally showed bilateral alveolar infiltrates suggestive of acute pulmonary edema but followup chest xray showed left airspace disease worrisome for PNA.  Her initial troponin was elevated at 0.12 but increased to 0.31 so Cardiology was asked to consult.  Patient is currently intubated but awake and when asked if she had had any chest pain prior to hospitalization or in the past 24 hours she shook her head no.  EKG showed NSR with interventricular conduction delay.     PMH:   Past Medical History  Diagnosis Date  . Coronary atherosclerosis of unspecified type of vessel, native or graft   . Anal fistula   . Arthritis   . Personal history of colonic polyps   . Crohn's colitis   . History of colostomy   . Heart murmur   . Hx of myocardial infarction 1986     PSH:   Past Surgical History  Procedure Laterality Date  . Cholecystectomy    . Colon resection  1988    1988 she thinks  . Angioplasty      1987  . Appendectomy      . Tubal ligation  1993  . Colostomy  2012  . Irrigation and debridement abscess N/A 06/18/2013    Procedure: Irrigation and Debridiment of peristomal abcess;  Surgeon: Gayland Curry, MD;  Location: [facility];  Service: General;  Laterality: N/A;  . Laparotomy N/A 06/02/2014    Procedure: EXPLORATORY LAPAROTOMY/PARTIAL COLECTOMY COLOSTOMY;  Surgeon: Excell Seltzer, MD;  Location: [facility];  Service: General;  Laterality: N/A;    Allergies:  Remicade Prior to Admit Meds:   Facility-administered medications prior to admission  Medication Dose Route Frequency Provider Last Rate Last Dose  . pneumococcal 23 valent vaccine (PNU-IMMUNE) injection 0.5 mL  0.5 mL Intramuscular Once Lafayette Dragon, MD       Prescriptions prior to admission  Medication Sig Dispense Refill Last Dose  . Adalimumab 40 MG/0.8ML PNKT Inject 0.8 mLs into the skin once a week.   06/01/2014 at 0830  . alendronate (FOSAMAX) 70 MG tablet 70 mg. Take with a full glass of water on an empty stomach. No food for 30 minutes after taking Takes on Tuesdays   05/27/2014 at Unknown time  . aspirin 81 MG tablet Take 81 mg by mouth daily.     Past Week at Unknown time  . Calcium Carbonate-Vitamin D (CALCIUM-VITAMIN D) 600-125 MG-UNIT TABS Take 1200mg  with vitamin D daily. 60 each 0 Past Week at Unknown time  .  diazepam (VALIUM) 5 MG tablet TAKE 1 TABLET BY MOUTH EVERY DAY AS NEEDED 30 tablet 1 Past Month at Unknown time  . dicyclomine (BENTYL) 20 MG tablet Take 1 tablet by mouth twice daily as needed for crampy abdominal pain 60 tablet 5 Past Week at Unknown time  . ergocalciferol (VITAMIN D2) 50000 UNITS capsule Take one po weekly x 12 weeks 4 capsule 3 Past Week at Unknown time  . folic acid (FOLVITE) 1 MG tablet Take 1 tablet (1 mg total) by mouth daily. 100 tablet 1 Past Week at Unknown time  . HYDROcodone-acetaminophen (NORCO/VICODIN) 5-325 MG per tablet Take 1 tablet by mouth every 6 (six) hours as needed for moderate pain. 90 tablet 0  06/02/2014 at Unknown time  . Multiple Vitamin (MULTIVITAMIN PO) Take 1 tablet by mouth daily.    Past Week at Unknown time  . predniSONE (DELTASONE) 10 MG tablet TAKE 1 TABLET BY MOUTH EVERY DAY AS DIRECTED 100 tablet 0 06/01/2014 at Unknown time  . sulfaSALAzine (AZULFIDINE) 500 MG tablet Take 1 tablet by mouth twice daily x 1 week, then increase to 2 tablets twice daily thereafter. Pharmacy-please d/c rx for lialda (Patient taking differently: Take 1,000 mg by mouth 2 (two) times daily. l) 120 tablet 4 06/02/2014 at Unknown time  . adalimumab (HUMIRA) 40 MG/0.8ML injection Inject 0.8 mLs (40 mg total) into the skin once a week. 1 each 0 Taking  . ciprofloxacin (CIPRO) 500 MG tablet Take 1 tablet (500 mg total) by mouth 2 (two) times daily. 20 tablet 0    Fam HX:    Family History  Problem Relation Age of Onset  . Heart disease Brother   . Heart disease Mother   . Lung cancer Paternal Uncle   . Colon polyps Neg Hx   . Esophageal cancer Neg Hx   . Rectal cancer Neg Hx   . Stomach cancer Neg Hx   . Stroke Father    Social HX:    History   Social History  . Marital Status: Divorced    Spouse Name: N/A  . Number of Children: 1  . Years of Education: N/A   Occupational History  . disabled    Social History Main Topics  . Smoking status: Current Every Day Smoker -- 0.50 packs/day    Types: Cigarettes  . Smokeless tobacco: Never Used     Comment: 3 cigarettes daily  . Alcohol Use: No  . Drug Use: No  . Sexual Activity: Not on file   Other Topics Concern  . 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 rate  17, height 5' (1.524 m), weight 109 lb 12.6 oz (49.8 kg), SpO2 93 %.    General: Well developed, well nourished, in no acute distress Head: Eyes PERRLA, No xanthomas.   Normal cephalic and atramatic  Lungs:   Diffuse rhonchi throughout anteriorly with crackles Heart:   HRRR S1 S2 Pulses are 2+ & equal. Abdomen: Bowel sounds are positive, abdomen soft and non-tender without masses  Extremities:   No clubbing, cyanosis or  edema.  DP +1      Labs:   Lab Results  Component Value Date   WBC 15.2* 06/04/2014   HGB 10.8* 06/04/2014   HCT 34.6* 06/04/2014   MCV 96.1 06/04/2014   PLT 310 06/04/2014    Recent Labs Lab 06/04/14 1030  NA 137  K 3.6  CL 107  CO2 24  BUN 9  CREATININE 0.93  CALCIUM 7.2*  PROT 5.3*  BILITOT 0.7  ALKPHOS 50  ALT 18  AST 62*  GLUCOSE 103*   No results found for: PTT Lab Results  Component Value Date   INR 1.17 06/04/2014   Lab Results  Component Value Date   CKTOTAL 1246* 06/04/2014   CKMB 18.7* 06/04/2014   TROPONINI 0.22* 06/04/2014    No results found for: CHOL No results found for: HDL No results found for: LDLCALC No results found for: TRIG No results found for: CHOLHDL No results found for: LDLDIRECT    Radiology:  Dg Chest Port 1 View  06/04/2014   CLINICAL DATA:  Evaluate central line placement  EXAM: PORTABLE CHEST - 1 VIEW  COMPARISON:  Same day at 9:30 a.m.  FINDINGS: New left IJ central line, tip at the upper cavoatrial junction. Endotracheal and orogastric tubes remain in good position. There is no complicating pneumothorax or new mediastinal widening.  Normal heart size and aortic contours. Intervally increased density of asymmetric to the left airspace disease.  IMPRESSION: 1. New left IJ central line, tip in good position.  No pneumothorax. 2. Increased pneumonia or noncardiogenic edema.   Electronically Signed   By: Monte Fantasia M.D.   On: 06/04/2014 13:23   Dg Chest Port 1 View  06/04/2014   CLINICAL DATA:   Intubation.  EXAM: PORTABLE CHEST - 1 VIEW  COMPARISON:  None.  FINDINGS: Endotracheal tube noted with tip 2.7 cm above the carina. NG tube noted with tip below left hemidiaphragm. Dense bilateral pulmonary infiltrates again noted. Interim partial clearing of right lower lobe atelectasis. Small right pleural effusion noted. Heart size normal. No pneumothorax. No acute bony abnormality .  IMPRESSION: 1. Interim intubation, endotracheal tube tip 2.7 cm above the carina. NG tube in stable position. 2. Persistent dense bilateral pulmonary infiltrates and small right pleural effusion. Interim partial clearing of right lower lobe atelectasis.   Electronically Signed   By: Marcello Moores  Register   On: 06/04/2014 09:41   Dg Chest Port 1 View  06/04/2014   CLINICAL DATA:  Shortness of Breath  EXAM: PORTABLE CHEST - 1 VIEW  COMPARISON:  June 02, 2014  FINDINGS: There is now extensive patchy airspace consolidation bilaterally. There is also consolidation in portions of the right middle and lower lobes. The heart size is normal. The pulmonary vascular is normal.  The pneumoperitoneum seen 1 day prior is not appreciable currently. There are surgical clips in the upper abdomen. Nasogastric tube tip and side port are in the stomach.  IMPRESSION: Extensive airspace consolidation bilaterally. Volume loss and significant portions of the right middle and lower lobes. Obstruction of the right bronchus intermedius is questioned. This finding may warrant bronchoscopy. Differential considerations for the current findings include pneumonia, aspiration, or possibly noncardiogenic edema. More than 1 of these entities may exist concurrently. Nasogastric tube tip and side port in stomach.   Electronically Signed   By: Lowella Grip III M.D.   On: 06/04/2014 08:30    EKG:  NSR with nonspecific IVCD  ASSESSMENT/PLAN:  1.  Acute hypoxic respiratory failure  initially thought to be due to acute pulmonary edema but repeat chest xray ? PNA.   BNP is only mildly elevated so I suspect there is underlying lung process going on besides acute pulmonary edema to cause an acute deterioration.  Continue IV diuresis as BP allows.  2.  Mildly elevated troponin in the setting of recent acute bowel perforation and respiratory failure that most likely represents demand ischemia .  EKG shows IVCD and I do not have an old EKG to compare.  The patient is intubated but awake and shook her head no when asked if she had had any recent CP.   Continue on IV Heparin for now until enzymes cycled to see trend.  Will check 2D echo to assess LVF and wall motion. 3.  Bowel perforation s/p emergent laparotomy 4.  Remote CAD with history of PCI in [facility], MD  06/04/2014  10:13 PM

## 2014-06-04 NOTE — Progress Notes (Signed)
[facility] Progress Note  Subjective:   POD 2 s/p partial colectomy and end colostomy due to bowel perforation in pt with longstanding Crohns.   Pt noted to have decreased 02 sats. Pt feels unable to take a deep breath. CXR this am with airspace consolidation.   Objective:  Vital signs in last 24 hours: Temp:  [98.3 F (36.8 C)-101.7 F (38.7 C)] 100.3 F (37.9 C) (03/30 0800) Pulse Rate:  [77-139] 135 (03/30 0745) Resp:  [0-38] 26 (03/30 0745) BP: (83-139)/(43-75) 120/75 mmHg (03/30 0600) SpO2:  [84 %-97 %] 91 % (03/30 0745) Last BM Date: 06/01/14 General:   Alert,   tachypnic  Anesthesia in with pt--intubating  Intake/Output from previous day: 03/29 0701 - 03/30 0700 In: 3050 [I.V.:3000; IV Piggyback:50] Out: 825 [Urine:825] Intake/Output this shift: Total I/O In: 93.8 [I.V.:93.8] Out: -   Lab Results:  Recent Labs  06/02/14 1703 06/03/14 0357 06/04/14 0335  WBC 17.8* 10.6* 14.5*  HGB 12.7 10.7* 9.6*  HCT 39.8 33.4* 30.0*  PLT 502* 341 308   BMET  Recent Labs  06/02/14 1703 06/03/14 0357 06/04/14 0335  NA 133* 136 138  K 2.8* 3.8 3.9  CL 97 106 108  CO2 24 22 24   GLUCOSE 154* 139* 109*  BUN 9 11 10   CREATININE 0.72 0.88 0.87  CALCIUM 8.7 7.5* 7.5*   LFT  Recent Labs  06/02/14 1703  PROT 7.2  ALBUMIN 3.8  AST 24  ALT 13  ALKPHOS 72  BILITOT 0.3    Dg Chest Port 1 View  06/04/2014   CLINICAL DATA:  Shortness of Breath  EXAM: PORTABLE CHEST - 1 VIEW  COMPARISON:  June 02, 2014  FINDINGS: There is now extensive patchy airspace consolidation bilaterally. There is also consolidation in portions of the right middle and lower lobes. The heart size is normal. The pulmonary vascular is normal.  The pneumoperitoneum seen 1 day prior is not appreciable currently. There are surgical clips in the upper abdomen. Nasogastric tube tip and side port are in the stomach.  IMPRESSION: Extensive airspace consolidation bilaterally. Volume loss  and significant portions of the right middle and lower lobes. Obstruction of the right bronchus intermedius is questioned. This finding may warrant bronchoscopy. Differential considerations for the current findings include pneumonia, aspiration, or possibly noncardiogenic edema. More than 1 of these entities may exist concurrently. Nasogastric tube tip and side port in stomach.   Electronically Signed   By: Lowella Grip III M.D.   On: 06/04/2014 08:30   Dg Abd Acute W/chest  06/02/2014   CLINICAL DATA:  Abdominal pain, nausea and vomiting, bleeding after colonoscopy, evaluate for free air.  EXAM: ACUTE ABDOMEN SERIES (ABDOMEN 2 VIEW & CHEST 1 VIEW)  COMPARISON:  None.  FINDINGS: Moderate free intra-abdominal air. There is gaseous distention of transverse colon with air-fluid level. Enteric chain sutures noted in the left upper quadrant. Small volume of air throughout normal caliber small bowel loops. Multiple surgical clips in the right upper quadrant of the abdomen. Small air-fluid level in the stomach. Bibasilar atelectasis. Cardiomediastinal contours are normal. No acute osseous abnormalities are seen.  IMPRESSION: Moderate free intra-abdominal air. Gaseous distention of transverse colon with air-fluid level.  Critical Value/emergent results were called by telephone at the time of interpretation on 06/02/2014 at 7:33 pm to Dr. Ernestina Patches , who verbally acknowledged these results.   Electronically Signed   By: Jeb Levering M.D.   On: 06/02/2014 19:34  ASSESSMENT/PLAN:  59 yo female s/p partial colectomy with end colostomy  After sustaining bowel perf, now POD 2 with acute onset SOB/dyspnea. CXR with airspace consolidation ? Pneumona/aspiration, edema? Critical care in with pt along with anesthesia. Cont per CC and [facility].     LOS: 2 days   Rigdon Macomber, Vita Barley PA-C 06/04/2014, Pager [phone]

## 2014-06-04 NOTE — Consult Note (Signed)
PULMONARY / CRITICAL CARE MEDICINE   Name: [name] MRN: [id number] DOB: [birth year]   PCP Nance Pear., NP   ADMISSION DATE:  06/02/2014 CONSULTATION DATE:  06/04/2014   REFERRING MD :  DR Leighton Ruff  CHIEF COMPLAINT:  Post Op acute resp failure   SIGNIFICANT EVENTS: 06/02/2014 - admit 06/04/2014 - PCCM consult   HISTORY OF PRESENT ILLNESS:  59 year old femal smoker with hx of MI in the 1980s with s.p stent (details not known), and hx of ICU, septic shock from PNa/cellulitis and prolonged critical illness 7 years ago at [facility] and IBD - immunsupprssed on humira, ? Prednisone and sulfasalazine. Admitted 06/02/2014 with colonic bowel perforation and s/p EXPLORATORY LAPAROTOMY/PARTIAL COLECTOMY WITH RESECTION OF COLOSTOMY And creation of new colostomy on 06/02/14. Had clear cxr at admit. Was doing well post op and then on 06/04/2014 over a matter of few hours develop sudden onset, rapidly progressive acute resp failure needing emergent intubation. Pink frothy sputum noted from ET tube post intubation. CXR showed bilateral alveolar infiltrates suggestive of acute pulmonary edema    PAST MEDICAL HISTORY :   has a past medical history of Coronary atherosclerosis of unspecified type of vessel, native or graft; Anal fistula; Arthritis; Personal history of colonic polyps; Crohn's colitis; History of colostomy; Heart murmur; and myocardial infarction (1986).  has past surgical history that includes Cholecystectomy; Colon resection (1988); Angioplasty; Appendectomy; Tubal ligation (1993); Colostomy (2012); Irrigation and debridement abscess (N/A, 06/18/2013); and laparotomy (N/A, 06/02/2014). Prior to Admission medications   Medication Sig Start Date End Date Taking? Authorizing Provider  Adalimumab 40 MG/0.8ML PNKT Inject 0.8 mLs into the skin once a week.   Yes Historical Provider, MD  alendronate (FOSAMAX) 70 MG tablet 70 mg. Take with a full glass of water on an empty stomach. No food  for 30 minutes after taking Takes on Tuesdays 12/07/12  Yes Lafayette Dragon, MD  aspirin 81 MG tablet Take 81 mg by mouth daily.     Yes Historical Provider, MD  Calcium Carbonate-Vitamin D (CALCIUM-VITAMIN D) 600-125 MG-UNIT TABS Take 1200mg  with vitamin D daily. 12/07/12  Yes Lafayette Dragon, MD  diazepam (VALIUM) 5 MG tablet TAKE 1 TABLET BY MOUTH EVERY DAY AS NEEDED 10/09/13  Yes Lafayette Dragon, MD  dicyclomine (BENTYL) 20 MG tablet Take 1 tablet by mouth twice daily as needed for crampy abdominal pain 09/04/13  Yes Lafayette Dragon, MD  ergocalciferol (VITAMIN D2) 50000 UNITS capsule Take one po weekly x 12 weeks 05/22/13  Yes Lafayette Dragon, MD  folic acid (FOLVITE) 1 MG tablet Take 1 tablet (1 mg total) by mouth daily. 01/27/14  Yes Lafayette Dragon, MD  HYDROcodone-acetaminophen (NORCO/VICODIN) 5-325 MG per tablet Take 1 tablet by mouth every 6 (six) hours as needed for moderate pain. 05/27/14  Yes Lafayette Dragon, MD  Multiple Vitamin (MULTIVITAMIN PO) Take 1 tablet by mouth daily.    Yes Historical Provider, MD  predniSONE (DELTASONE) 10 MG tablet TAKE 1 TABLET BY MOUTH EVERY DAY AS DIRECTED 12/25/13  Yes Lafayette Dragon, MD  sulfaSALAzine (AZULFIDINE) 500 MG tablet Take 1 tablet by mouth twice daily x 1 week, then increase to 2 tablets twice daily thereafter. Pharmacy-please d/c rx for lialda Patient taking differently: Take 1,000 mg by mouth 2 (two) times daily. l 01/27/14  Yes Lafayette Dragon, MD  adalimumab (HUMIRA) 40 MG/0.8ML injection Inject 0.8 mLs (40 mg total) into the skin once a week. 05/21/13 05/21/14  Sydell Axon  Andris Baumann, MD  ciprofloxacin (CIPRO) 500 MG tablet Take 1 tablet (500 mg total) by mouth 2 (two) times daily. 06/02/14   Lafayette Dragon, MD   Allergies  Allergen Reactions  . Remicade [Infliximab]     convulsions    FAMILY HISTORY:  indicated that her mother is deceased. She indicated that her father is deceased.  SOCIAL HISTORY:  reports that she has been smoking Cigarettes.  She has been smoking  about 0.50 packs per day. She has never used smokeless tobacco. She reports that she does not drink alcohol or use illicit drugs.  REVIEW OF SYSTEMS:  unelicitable  SUBJECTIVE:   VITAL SIGNS: Temp:  [98.3 F (36.8 C)-101.7 F (38.7 C)] 100.3 F (37.9 C) (03/30 0800) Pulse Rate:  [77-139] 135 (03/30 0745) Resp:  [0-38] 26 (03/30 0745) BP: (83-139)/(43-75) 120/75 mmHg (03/30 0600) SpO2:  [84 %-96 %] 91 % (03/30 0745) HEMODYNAMICS:   VENTILATOR SETTINGS:   INTAKE / OUTPUT:  Intake/Output Summary (Last 24 hours) at 06/04/14 0933 Last data filed at 06/04/14 0745  Gross per 24 hour  Intake 2843.76 ml  Output    825 ml  Net 2018.76 ml    PHYSICAL EXAMINATION: General:  75 looknig female just intubated. Looks criticaly ill Neuro:  S/p intubation sedated but was talklig on phone and neuro intact per report prior to intubation HEENT:  ET tube + Cardiovascular:  Tachycardic, Normal heart sounds Lungs:  Bilateral crackles+ Abdomen:  Soft,  Musculoskeletal:  No cyanosis, no clubbing. No edema Skin:  Intact anteriorly  LABS:  PULMONARY No results for input(s): PHART, PCO2ART, PO2ART, HCO3, TCO2, O2SAT in the last 168 hours.  Invalid input(s): PCO2, PO2  CBC  Recent Labs Lab 06/02/14 1703 06/03/14 0357 06/04/14 0335  HGB 12.7 10.7* 9.6*  HCT 39.8 33.4* 30.0*  WBC 17.8* 10.6* 14.5*  PLT 502* 341 308    COAGULATION No results for input(s): INR in the last 168 hours.  CARDIAC  No results for input(s): TROPONINI in the last 168 hours. No results for input(s): PROBNP in the last 168 hours.   CHEMISTRY  Recent Labs Lab 06/02/14 1703 06/03/14 0357 06/04/14 0335  NA 133* 136 138  K 2.8* 3.8 3.9  CL 97 106 108  CO2 24 22 24   GLUCOSE 154* 139* 109*  BUN 9 11 10   CREATININE 0.72 0.88 0.87  CALCIUM 8.7 7.5* 7.5*   Estimated Creatinine Clearance: 50 mL/min (by C-G formula based on Cr of 0.87).   LIVER  Recent Labs Lab 06/02/14 1703  AST 24  ALT 13   ALKPHOS 72  BILITOT 0.3  PROT 7.2  ALBUMIN 3.8     INFECTIOUS  Recent Labs Lab 06/02/14 1712  LATICACIDVEN 1.10     ENDOCRINE CBG (last 3)  No results for input(s): GLUCAP in the last 72 hours.       IMAGING x48h Dg Chest Port 1 View  06/04/2014   CLINICAL DATA:  Intubation.  EXAM: PORTABLE CHEST - 1 VIEW  COMPARISON:  None.  FINDINGS: Endotracheal tube noted with tip 2.7 cm above the carina. NG tube noted with tip below left hemidiaphragm. Dense bilateral pulmonary infiltrates again noted. Interim partial clearing of right lower lobe atelectasis. Small right pleural effusion noted. Heart size normal. No pneumothorax. No acute bony abnormality .  IMPRESSION: 1. Interim intubation, endotracheal tube tip 2.7 cm above the carina. NG tube in stable position. 2. Persistent dense bilateral pulmonary infiltrates and small right pleural effusion. Interim partial  clearing of right lower lobe atelectasis.   Electronically Signed   By: Marcello Moores  Register   On: 06/04/2014 09:41   Dg Chest Port 1 View  06/04/2014   CLINICAL DATA:  Shortness of Breath  EXAM: PORTABLE CHEST - 1 VIEW  COMPARISON:  June 02, 2014  FINDINGS: There is now extensive patchy airspace consolidation bilaterally. There is also consolidation in portions of the right middle and lower lobes. The heart size is normal. The pulmonary vascular is normal.  The pneumoperitoneum seen 1 day prior is not appreciable currently. There are surgical clips in the upper abdomen. Nasogastric tube tip and side port are in the stomach.  IMPRESSION: Extensive airspace consolidation bilaterally. Volume loss and significant portions of the right middle and lower lobes. Obstruction of the right bronchus intermedius is questioned. This finding may warrant bronchoscopy. Differential considerations for the current findings include pneumonia, aspiration, or possibly noncardiogenic edema. More than 1 of these entities may exist concurrently. Nasogastric  tube tip and side port in stomach.   Electronically Signed   By: Lowella Grip III M.D.   On: 06/04/2014 08:30   Dg Abd Acute W/chest  06/02/2014   CLINICAL DATA:  Abdominal pain, nausea and vomiting, bleeding after colonoscopy, evaluate for free air.  EXAM: ACUTE ABDOMEN SERIES (ABDOMEN 2 VIEW & CHEST 1 VIEW)  COMPARISON:  None.  FINDINGS: Moderate free intra-abdominal air. There is gaseous distention of transverse colon with air-fluid level. Enteric chain sutures noted in the left upper quadrant. Small volume of air throughout normal caliber small bowel loops. Multiple surgical clips in the right upper quadrant of the abdomen. Small air-fluid level in the stomach. Bibasilar atelectasis. Cardiomediastinal contours are normal. No acute osseous abnormalities are seen.  IMPRESSION: Moderate free intra-abdominal air. Gaseous distention of transverse colon with air-fluid level.  Critical Value/emergent results were called by telephone at the time of interpretation on 06/02/2014 at 7:33 pm to Dr. Ernestina Patches , who verbally acknowledged these results.   Electronically Signed   By: Jeb Levering M.D.   On: 06/02/2014 19:34        ASSESSMENT / PLAN:  PULMONARY OETT  06/04/2014 >>  A: Acute Post OP resp failure with sudden onste new bilateral pulmonary infiltrates 06/04/14 . Associated with pink frothy secretions  - DDX:   - Acute Pulmonary Edema due to cardiac event  - HCAP (doubt)  - High risk opportunistic pathogens (doubt)  P:   Full vent support  CARDIOVASCULAR CVL planned 06/04/2014  A:  Hx of MI s/p stents in 1980s during age 12s Current  - concern for acute pulmonary edema   - post op hypotension +  ( at risk for realtive adrenal insuff )  P:  Stat echo Rule out MI Check bnp Rule out sepsis Start vasopressor for MAP > 65    RENAL A:  Intact but at risk for AKI P:   Maintain volume and hemodynamics  GASTROINTESTINAL A:   Active chron's patient S/p p-lap 328/16  for colonic perf P:   Per ccs  HEMATOLOGIC A:  AT risk for anemia of critical illness P:  PRBC per ICU guidelines  INFECTIOUS BCx2  UC  Tracheal aspirate for bacteria and virus  PCT Lactat  A:   Baseline  - Immunocmpromised patient due to humira + prednisone Current   - admit with bowel perf / peritonitis 06/02/14  - pulmonary event -06/04/14 doubt infectious  P:   Pan culture as above Zosyn per [facility] dependinng on  course might need bronch bal for opportunistic pathogens  ENDOCRINE A:  Chronic pred P:   Stress dose hydrocort - aim to taper asap due to bowel perf surgery SSI  NEUROLOGIC A:   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 Updated in detail      TODAY'S SUMMARY:  Intubated. Need to rule out primary cardiac event       The patient is critically ill with multiple organ systems failure and requires high complexity decision making for assessment and support, frequent evaluation and titration of therapies, application of advanced monitoring technologies and extensive interpretation of multiple databases.   Critical Care Time devoted to patient care services described in this note is  60  Minutes. This time reflects time of care of this signee Dr Brand Males. This critical care time does not reflect procedure time, or teaching time or supervisory time of PA/NP/Med student/Med Resident etc but could involve care discussion time    Dr. Brand Males, M.D., [facility].C.P Pulmonary and Critical Care Medicine Staff Physician [place] [facility] Pager: [phone], If no answer or between  15:00h - 7:00h: call [phone]  06/04/2014 9:55 AM

## 2014-06-04 NOTE — Progress Notes (Addendum)
  RE round   Resp - 100% fio2 . cntinue vent support  CVS  - diursed and will need neo  - possible NSTEMi - await echo  - might need cards consul - start empiric IV /heparin  (ok per [facility]). = dc if trop flat  Renal  - low mag, low phos, low k - will replete   ID  - high PCT - cont zosyn, add vanc   CNS   - cont fent prn  ENDO  - stress dose steroids    The patient is critically ill with multiple organ systems failure and requires high complexity decision making for assessment and support, frequent evaluation and titration of therapies, application of advanced monitoring technologies and extensive interpretation of multiple databases.   Critical Care Time devoted to patient care services described in this note is  30  Minutes. This time reflects time of care of this signee Dr Brand Males. This critical care time does not reflect procedure time, or teaching time or supervisory time of PA/NP/Med student/Med Resident etc but could involve care discussion time    Dr. Brand Males, M.D., [facility].C.P Pulmonary and Critical Care Medicine Staff Physician [place] [facility] Pager: [phone], If no answer or between  15:00h - 7:00h: call [phone]  06/04/2014 12:06 PM

## 2014-06-04 NOTE — Progress Notes (Signed)
ANTIBIOTIC CONSULT NOTE - INITIAL  Pharmacy Consult for Vancomycin Indication: pneumonia  Allergies  Allergen Reactions  . Remicade [Infliximab]     convulsions    Patient Measurements: Height: 5' (152.4 cm) Weight: 109 lb 12.6 oz (49.8 kg) IBW/kg (Calculated) : 45.5  Vital Signs: Temp: 102.2 F (39 C) (03/30 1121) Temp Source: Oral (03/30 1121) BP: 124/54 mmHg (03/30 1100) Pulse Rate: 109 (03/30 1121) Intake/Output from previous day: 03/29 0701 - 03/30 0700 In: 3133.3 [I.V.:3083.3; IV Piggyback:50] Out: 825 [Urine:825] Intake/Output from this shift: Total I/O In: 1716.7 [I.V.:666.7; Other:1000; IV Piggyback:50] Out: 1175 [Urine:1025; Stool:150]  Labs:  Recent Labs  06/03/14 0357 06/04/14 0335 06/04/14 1030  WBC 10.6* 14.5* 15.2*  HGB 10.7* 9.6* 10.8*  PLT 341 308 310  CREATININE 0.88 0.87 0.93   Estimated Creatinine Clearance: 46.8 mL/min (by C-G formula based on Cr of 0.93). No results for input(s): VANCOTROUGH, VANCOPEAK, VANCORANDOM, GENTTROUGH, GENTPEAK, GENTRANDOM, TOBRATROUGH, TOBRAPEAK, TOBRARND, AMIKACINPEAK, AMIKACINTROU, AMIKACIN in the last 72 hours.   Microbiology: Recent Results (from the past 720 hour(s))  MRSA PCR Screening     Status: None   Collection Time: 06/02/14 11:50 PM  Result Value Ref Range Status   MRSA by PCR NEGATIVE NEGATIVE Final    Comment:        The GeneXpert MRSA Assay (FDA approved for NASAL specimens only), is one component of a comprehensive MRSA colonization surveillance program. It is not intended to diagnose MRSA infection nor to guide or monitor treatment for MRSA infections.     Medical History: Past Medical History  Diagnosis Date  . Coronary atherosclerosis of unspecified type of vessel, native or graft   . Anal fistula   . Arthritis   . Personal history of colonic polyps   . Crohn's colitis   . History of colostomy   . Heart murmur   . Hx of myocardial infarction 1986     [name]  admitted with colonic bowel perforation. S/p ex lap with partial colectomy 3/28. In ICU with postop respiratory failure on 3/30, requiring emergent intubation.  CXR showed bilateral infiltrates suggestive of acute pulmonary edema. WBC elevated and patient is febrile. Patient has been on Zosyn per [facility]. PCCM added vancomycin per pharmacy for possible pneumonia.  Note patient is on Humira and prednisone PTA.    3/28 >> Zosyn >> 3/30 >> Vancomycin >>  Tmax: 102.2 WBC: elevated and increasing Renal: SCr 0.93, CrCl~50 ml/min (CG)  Micro: Blood, urine, respiratory cultures ordered Respiratory virus panel ordered  Goal of Therapy:  Vancomycin trough level 15-20 mcg/ml  Plan:  1.  Vancomycin 1g IV q24h. 2.  F/u SCr, trough level, culture results, clinical course.   [name] 06/04/2014,1:03 PM

## 2014-06-04 NOTE — Progress Notes (Signed)
ANTICOAGULATION CONSULT NOTE  Pharmacy Consult for Heparin Indication: chest pain/ACS   Assessment: 46 yoF s/p ex lap with partial colectomy 3/28 with possible NSTEMI 3/30. Pharmacy consulted to start empiric IV heparin.   Heparin level 0.1, subtherapeutic  Heparin infusing at 6 ml/hr.  No infusion complications or interruptions.   No bruising or bleeding reported by RN.   Goal of Therapy:  Heparin level 0.3-0.7 units/ml (aim for 0.3-0.5 units/ml per MD instruction) Monitor platelets by anticoagulation protocol: Yes   Plan:   Increase to heparin IV infusion at 800 units/hr  Heparin level 6 hours after rate change  Daily heparin level and CBC  Continue to monitor H&H and platelets  Gretta Arab PharmD, BCPS Pager [phone] 06/04/2014 9:17 PM

## 2014-06-04 NOTE — Progress Notes (Signed)
Pt with O2 2L [place] to keep sats >=90%. Pt noncompliant with O2. Sats decrease to 80's on RA. Increased O2 to 5L.  Pt taught TCDB exercises.  Pt states she is unable to take a deep breath due to surgical pain. Encouraged and coached again on deep breathing in through nose and out through mouth.  Pt cont to only take shallow breaths. Pt with very congested cough. Pt is asymptomatic. No c/o dizziness, lightheadedness, confusion.  Pt with no signs of cyanosis, good cap refill, pt remains A/Ox4 and talking without c/o SOB.  On call paged. Awaiting call back.   Petra Kuba RN

## 2014-06-05 ENCOUNTER — Inpatient Hospital Stay (HOSPITAL_COMMUNITY): Payer: Commercial Managed Care - HMO

## 2014-06-05 ENCOUNTER — Telehealth: Payer: Self-pay | Admitting: Internal Medicine

## 2014-06-05 ENCOUNTER — Encounter: Payer: Self-pay | Admitting: *Deleted

## 2014-06-05 DIAGNOSIS — J80 Acute respiratory distress syndrome: Secondary | ICD-10-CM

## 2014-06-05 DIAGNOSIS — A419 Sepsis, unspecified organism: Secondary | ICD-10-CM

## 2014-06-05 DIAGNOSIS — K631 Perforation of intestine (nontraumatic): Principal | ICD-10-CM

## 2014-06-05 DIAGNOSIS — R6521 Severe sepsis with septic shock: Secondary | ICD-10-CM

## 2014-06-05 LAB — BLOOD GAS, ARTERIAL
ACID-BASE DEFICIT: 6.1 mmol/L — AB (ref 0.0–2.0)
ACID-BASE DEFICIT: 7 mmol/L — AB (ref 0.0–2.0)
Acid-base deficit: 8.3 mmol/L — ABNORMAL HIGH (ref 0.0–2.0)
Acid-base deficit: 6.3 mmol/L — ABNORMAL HIGH (ref 0.0–2.0)
Acid-base deficit: 6.6 mmol/L — ABNORMAL HIGH (ref 0.0–2.0)
BICARBONATE: 19.6 meq/L — AB (ref 20.0–24.0)
BICARBONATE: 19.3 meq/L — AB (ref 20.0–24.0)
Bicarbonate: 20.7 mEq/L (ref 20.0–24.0)
Bicarbonate: 19.5 mEq/L — ABNORMAL LOW (ref 20.0–24.0)
Bicarbonate: 19.8 mEq/L — ABNORMAL LOW (ref 20.0–24.0)
DRAWN BY: 331471
Drawn by: 331471
Drawn by: 307971
Drawn by: 331471
Drawn by: 308601
FIO2: 0.6 %
FIO2: 0.6 %
FIO2: 0.6 %
FIO2: 0.6 %
FIO2: 0.5 %
LHR: 14 {breaths}/min
LHR: 28 {breaths}/min
MECHVT: 320 mL
MECHVT: 320 mL
MECHVT: 280 mL
O2 SAT: 90.9 %
O2 SAT: 97.7 %
O2 Saturation: 92.1 %
O2 Saturation: 95.4 %
O2 Saturation: 95.5 %
PATIENT TEMPERATURE: 98.6
PATIENT TEMPERATURE: 98.6
PCO2 ART: 42.7 mmHg (ref 35.0–45.0)
PCO2 ART: 47.1 mmHg — AB (ref 35.0–45.0)
PEEP/CPAP: 8 cmH2O
PEEP/CPAP: 8 cmH2O
PEEP/CPAP: 8 cmH2O
PEEP/CPAP: 16 cmH2O
PEEP: 8 cmH2O
PO2 ART: 68.4 mmHg — AB (ref 80.0–100.0)
PO2 ART: 88 mmHg (ref 80.0–100.0)
PO2 ART: 87.7 mmHg (ref 80.0–100.0)
Patient temperature: 98.6
Patient temperature: 98.6
Patient temperature: 98.6
RATE: 32 resp/min
RATE: 35 resp/min
RATE: 35 resp/min
TCO2: 20.4 mmol/L (ref 0–100)
TCO2: 18.7 mmol/L (ref 0–100)
TCO2: 19.2 mmol/L (ref 0–100)
TCO2: 18.8 mmol/L (ref 0–100)
TCO2: 18.6 mmol/L (ref 0–100)
VT: 360 mL
VT: 360 mL
pCO2 arterial: 62.9 mmHg (ref 35.0–45.0)
pCO2 arterial: 42.2 mmHg (ref 35.0–45.0)
pCO2 arterial: 44.7 mmHg (ref 35.0–45.0)
pH, Arterial: 7.143 — CL (ref 7.350–7.450)
pH, Arterial: 7.282 — ABNORMAL LOW (ref 7.350–7.450)
pH, Arterial: 7.248 — ABNORMAL LOW (ref 7.350–7.450)
pH, Arterial: 7.289 — ABNORMAL LOW (ref 7.350–7.450)
pH, Arterial: 7.258 — ABNORMAL LOW (ref 7.350–7.450)
pO2, Arterial: 74.3 mmHg — ABNORMAL LOW (ref 80.0–100.0)
pO2, Arterial: 141 mmHg — ABNORMAL HIGH (ref 80.0–100.0)

## 2014-06-05 LAB — GLUCOSE, CAPILLARY
GLUCOSE-CAPILLARY: 128 mg/dL — AB (ref 70–99)
GLUCOSE-CAPILLARY: 159 mg/dL — AB (ref 70–99)
Glucose-Capillary: 144 mg/dL — ABNORMAL HIGH (ref 70–99)
Glucose-Capillary: 153 mg/dL — ABNORMAL HIGH (ref 70–99)
Glucose-Capillary: 113 mg/dL — ABNORMAL HIGH (ref 70–99)
Glucose-Capillary: 92 mg/dL (ref 70–99)

## 2014-06-05 LAB — CBC WITH DIFFERENTIAL/PLATELET
Basophils Absolute: 0 10*3/uL (ref 0.0–0.1)
Basophils Relative: 0 % (ref 0–1)
EOS PCT: 0 % (ref 0–5)
Eosinophils Absolute: 0 10*3/uL (ref 0.0–0.7)
HEMATOCRIT: 32.2 % — AB (ref 36.0–46.0)
HEMOGLOBIN: 10.1 g/dL — AB (ref 12.0–15.0)
LYMPHS PCT: 3 % — AB (ref 12–46)
Lymphs Abs: 0.7 10*3/uL (ref 0.7–4.0)
MCH: 30.2 pg (ref 26.0–34.0)
MCHC: 31.4 g/dL (ref 30.0–36.0)
MCV: 96.4 fL (ref 78.0–100.0)
Monocytes Absolute: 0.8 10*3/uL (ref 0.1–1.0)
Monocytes Relative: 3 % (ref 3–12)
Neutro Abs: 20.7 10*3/uL — ABNORMAL HIGH (ref 1.7–7.7)
Neutrophils Relative %: 94 % — ABNORMAL HIGH (ref 43–77)
Platelets: 363 10*3/uL (ref 150–400)
RBC: 3.34 MIL/uL — ABNORMAL LOW (ref 3.87–5.11)
RDW: 13.4 % (ref 11.5–15.5)
WBC: 22.2 10*3/uL — AB (ref 4.0–10.5)

## 2014-06-05 LAB — BASIC METABOLIC PANEL
Anion gap: 4 — ABNORMAL LOW (ref 5–15)
BUN: 10 mg/dL (ref 6–23)
CHLORIDE: 105 mmol/L (ref 96–112)
CO2: 24 mmol/L (ref 19–32)
Calcium: 6.8 mg/dL — ABNORMAL LOW (ref 8.4–10.5)
Creatinine, Ser: 0.83 mg/dL (ref 0.50–1.10)
GFR calc Af Amer: 88 mL/min — ABNORMAL LOW (ref >90)
GFR calc non Af Amer: 76 mL/min — ABNORMAL LOW (ref >90)
GLUCOSE: 143 mg/dL — AB (ref 70–99)
POTASSIUM: 4.1 mmol/L (ref 3.5–5.1)
Sodium: 133 mmol/L — ABNORMAL LOW (ref 135–145)

## 2014-06-05 LAB — HEPARIN LEVEL (UNFRACTIONATED): Heparin Unfractionated: 0.25 IU/mL — ABNORMAL LOW (ref 0.30–0.70)

## 2014-06-05 LAB — PROCALCITONIN: Procalcitonin: 9.78 ng/mL

## 2014-06-05 LAB — LACTIC ACID, PLASMA: Lactic Acid, Venous: 1.2 mmol/L (ref 0.5–2.0)

## 2014-06-05 LAB — PREGNANCY, URINE: Preg Test, Ur: NEGATIVE

## 2014-06-05 LAB — PHOSPHORUS: Phosphorus: 2.3 mg/dL (ref 2.3–4.6)

## 2014-06-05 LAB — TROPONIN I: Troponin I: 0.14 ng/mL — ABNORMAL HIGH (ref <0.031)

## 2014-06-05 LAB — MAGNESIUM: MAGNESIUM: 2.4 mg/dL (ref 1.5–2.5)

## 2014-06-05 LAB — TRIGLYCERIDES: TRIGLYCERIDES: 104 mg/dL (ref <150)

## 2014-06-05 MED ORDER — FENTANYL BOLUS VIA INFUSION
50.0000 ug | INTRAVENOUS | Status: DC | PRN
  Administered 2014-06-06: 50 ug via INTRAVENOUS
  Filled 2014-06-05: qty 50

## 2014-06-05 MED ORDER — FENTANYL CITRATE 0.05 MG/ML IJ SOLN
25.0000 ug/h | INTRAMUSCULAR | Status: DC
  Administered 2014-06-05: 300 ug/h via INTRAVENOUS
  Administered 2014-06-06: 400 ug/h via INTRAVENOUS
  Filled 2014-06-05 (×4): qty 50

## 2014-06-05 MED ORDER — SODIUM CHLORIDE 0.9 % IV SOLN
1.0000 g | Freq: Once | INTRAVENOUS | Status: AC
  Administered 2014-06-05: 1 g via INTRAVENOUS
  Filled 2014-06-05: qty 10

## 2014-06-05 MED ORDER — ASPIRIN 81 MG PO CHEW
81.0000 mg | CHEWABLE_TABLET | Freq: Every day | ORAL | Status: DC
  Administered 2014-06-05 – 2014-06-23 (×19): 81 mg via NASOGASTRIC
  Filled 2014-06-05 (×20): qty 1

## 2014-06-05 MED ORDER — MIDAZOLAM HCL 2 MG/2ML IJ SOLN: 2.0000 mg | INTRAMUSCULAR | Status: DC | PRN

## 2014-06-05 MED ORDER — HEPARIN SODIUM (PORCINE) 5000 UNIT/ML IJ SOLN
5000.0000 [IU] | Freq: Three times a day (TID) | INTRAMUSCULAR | Status: DC
  Administered 2014-06-05 – 2014-06-23 (×54): 5000 [IU] via SUBCUTANEOUS
  Filled 2014-06-05 (×58): qty 1

## 2014-06-05 MED ORDER — CHLORHEXIDINE GLUCONATE 0.12 % MT SOLN
15.0000 mL | Freq: Two times a day (BID) | OROMUCOSAL | Status: DC
  Administered 2014-06-05 – 2014-06-18 (×26): 15 mL via OROMUCOSAL
  Filled 2014-06-05 (×25): qty 15

## 2014-06-05 MED ORDER — CETYLPYRIDINIUM CHLORIDE 0.05 % MT LIQD
7.0000 mL | Freq: Four times a day (QID) | OROMUCOSAL | Status: DC
  Administered 2014-06-05 – 2014-06-17 (×49): 7 mL via OROMUCOSAL

## 2014-06-05 MED ORDER — SODIUM CHLORIDE 0.9 % IV SOLN
0.0000 mg/h | INTRAVENOUS | Status: DC
  Administered 2014-06-05: 2 mg/h via INTRAVENOUS
  Administered 2014-06-06: 3 mg/h via INTRAVENOUS
  Filled 2014-06-05 (×3): qty 10

## 2014-06-05 MED ORDER — SODIUM CHLORIDE 0.9 % IV SOLN
INTRAVENOUS | Status: DC | PRN
  Administered 2014-06-14: 15:00:00 via INTRA_ARTERIAL

## 2014-06-05 MED ORDER — HEPARIN (PORCINE) IN NACL 100-0.45 UNIT/ML-% IJ SOLN
900.0000 [IU]/h | INTRAMUSCULAR | Status: DC
  Filled 2014-06-05: qty 250

## 2014-06-05 MED ORDER — MIDAZOLAM HCL 2 MG/2ML IJ SOLN
2.0000 mg | INTRAMUSCULAR | Status: DC | PRN
  Administered 2014-06-05: 2 mg via INTRAVENOUS
  Filled 2014-06-05: qty 2

## 2014-06-05 MED ORDER — FENTANYL CITRATE 0.05 MG/ML IJ SOLN: 50.0000 ug | Freq: Once | INTRAMUSCULAR | Status: DC

## 2014-06-05 MED ORDER — MIDAZOLAM BOLUS VIA INFUSION
1.0000 mg | INTRAVENOUS | Status: DC | PRN
  Administered 2014-06-06: 2 mg via INTRAVENOUS
  Filled 2014-06-05 (×2): qty 2

## 2014-06-05 MED ORDER — VANCOMYCIN HCL 500 MG IV SOLR
500.0000 mg | Freq: Two times a day (BID) | INTRAVENOUS | Status: DC
  Administered 2014-06-05 – 2014-06-11 (×13): 500 mg via INTRAVENOUS
  Filled 2014-06-05 (×13): qty 500

## 2014-06-05 NOTE — Progress Notes (Signed)
D/w Doreatha Martin -research coordinator  - patient now on NIH ARDS trial through [facility] network - ROSE study - RCT (non-blinded) Nimbex v Std of care sedation (non-paralytic)  - randomized to Non-paralytic sedation only arm  REc  -= Vt to go to 6cc/kg/IBW  - avoid paralytics  - RASS goal -1 but if asynchronous -justify lower RASS score - any questions overnight please call DR Chase Caller or Dr Asencion Noble or Doreatha Martin research coordinator - pager [phone]    Dr. Brand Males, M.D., F.C.C.P Pulmonary and Critical Care Medicine Staff Physician [place] [facility] Pager: [phone], If no answer or between  15:00h - 7:00h: call [phone]  06/05/2014 5:36 PM

## 2014-06-05 NOTE — Progress Notes (Signed)
PULMONARY / CRITICAL CARE MEDICINE   Name: [name] MRN: [id number] DOB: Feb 09, 1956   PCP Nance Pear., NP   ADMISSION DATE:  06/02/2014 CONSULTATION DATE:  06/04/2014   REFERRING MD :  Dr Leighton Ruff  CHIEF COMPLAINT:  Post Op acute resp failure   SIGNIFICANT EVENTS: 06/02/2014 - admit 06/04/2014 - PCCM consult   HISTORY OF PRESENT ILLNESS:  59 year old femal smoker with hx of MI in the 1980s with s.p stent (details not known), and hx of ICU, septic shock from PNa/cellulitis and prolonged critical illness 7 years ago at [facility] and IBD - immunsupprssed on humira, ? Prednisone and sulfasalazine. Admitted 06/02/2014 with colonic bowel perforation and s/p EXPLORATORY LAPAROTOMY/PARTIAL COLECTOMY WITH RESECTION OF COLOSTOMY And creation of new colostomy on 06/02/14. Had clear cxr at admit. Was doing well post op and then on 06/04/2014 over a matter of few hours develop sudden onset, rapidly progressive acute resp failure needing emergent intubation. Pink frothy sputum noted from ET tube post intubation. CXR showed bilateral alveolar infiltrates suggestive of acute pulmonary edema     SUBJECTIVE:   VITAL SIGNS: Temp:  [97.1 F (36.2 C)-102.2 F (39 C)] 97.1 F (36.2 C) (03/31 0830) Pulse Rate:  [65-116] 72 (03/31 0800) Resp:  [12-39] 14 (03/31 0800) BP: (54-158)/(33-86) 108/47 mmHg (03/31 0800) SpO2:  [87 %-98 %] 97 % (03/31 0800) FiO2 (%):  [60 %-100 %] 60 % (03/31 0830) Weight:  [122 lb 9.2 oz (55.6 kg)] 122 lb 9.2 oz (55.6 kg) (03/31 0400) HEMODYNAMICS: CVP:  [14 mmHg-15 mmHg] 14 mmHg VENTILATOR SETTINGS: Vent Mode:  [-] PRVC FiO2 (%):  [60 %-100 %] 60 % Set Rate:  [14 bmp] 14 bmp Vt Set:  [360 mL] 360 mL PEEP:  [5 cmH20-12 cmH20] 10 cmH20 Plateau Pressure:  [11 cmH20-30 cmH20] 23 cmH20 INTAKE / OUTPUT:  Intake/Output Summary (Last 24 hours) at 06/05/14 1015 Last data filed at 06/05/14 4665  Gross per 24 hour  Intake 5494.85 ml  Output   2390 ml  Net  3104.85 ml    PHYSICAL EXAMINATION: General:  Sedated on vent Neuro:  Sedated HEENT:  ET tube + NGT Cardiovascular:  Tachycardic, Normal heart sounds Lungs:  Decreased bs+ Abdomen:  Soft, dressing intact Musculoskeletal:  No cyanosis, no clubbing. No edema Skin:  Intact anteriorly  LABS:  PULMONARY  Recent Labs Lab 06/04/14 1049  PHART 7.302*  PCO2ART 49.3*  PO2ART 69.5*  HCO3 23.3  TCO2 22.0  O2SAT 90.3    CBC  Recent Labs Lab 06/04/14 0335 06/04/14 1030 06/05/14 0500  HGB 9.6* 10.8* 10.1*  HCT 30.0* 34.6* 32.2*  WBC 14.5* 15.2* 22.2*  PLT 308 310 363    COAGULATION  Recent Labs Lab 06/04/14 1030  INR 1.17    CARDIAC    Recent Labs Lab 06/04/14 0909 06/04/14 1030 06/04/14 1530 06/04/14 2010 06/05/14 0500  TROPONINI 0.12* 0.18* 0.31* 0.22* 0.14*   No results for input(s): PROBNP in the last 168 hours.   CHEMISTRY  Recent Labs Lab 06/02/14 1703 06/03/14 0357 06/04/14 0335 06/04/14 1030 06/05/14 0500  NA 133* 136 138 137 133*  K 2.8* 3.8 3.9 3.6 4.1  CL 97 106 108 107 105  CO2 24 22 24 24 24   GLUCOSE 154* 139* 109* 103* 143*  BUN 9 11 10 9 10   CREATININE 0.72 0.88 0.87 0.93 0.83  CALCIUM 8.7 7.5* 7.5* 7.2* 6.8*  MG  --   --   --  1.3* 2.4  PHOS  --   --   --  2.0* 2.3   Estimated Creatinine Clearance: 57 mL/min (by C-G formula based on Cr of 0.83).   LIVER  Recent Labs Lab 06/02/14 1703 06/04/14 1030  AST 24 62*  ALT 13 18  ALKPHOS 72 50  BILITOT 0.3 0.7  PROT 7.2 5.3*  ALBUMIN 3.8 2.3*  INR  --  1.17     INFECTIOUS  Recent Labs Lab 06/02/14 1712 06/04/14 1030 06/04/14 1040 06/05/14 0500  LATICACIDVEN 1.10  --  1.2  --   PROCALCITON  --  12.31  --  9.78     ENDOCRINE CBG (last 3)   Recent Labs  06/04/14 2341 06/05/14 0335 06/05/14 0814  GLUCAP 159* 144* 153*         IMAGING x48h Dg Chest 1 View  06/05/2014   CLINICAL DATA:  Respiratory failure.  EXAM: CHEST  1 VIEW  COMPARISON:   06/04/2014, 06/02/2014.  FINDINGS: Endotracheal tube, left IJ line, NG tube in stable position. Heart size stable. Persistent dense bilateral pulmonary infiltrates remain. Infiltrates have worsened from prior exam. No pleural effusion or pneumothorax.  IMPRESSION: 1. Lines and tubes in stable position. 2. Persistent dense bilateral pulmonary infiltrates. Infiltrates have worsened from prior exam.   Electronically Signed   By: Aurora   On: 06/05/2014 07:44   Dg Chest Port 1 View  06/04/2014   CLINICAL DATA:  Evaluate central line placement  EXAM: PORTABLE CHEST - 1 VIEW  COMPARISON:  Same day at 9:30 a.m.  FINDINGS: New left IJ central line, tip at the upper cavoatrial junction. Endotracheal and orogastric tubes remain in good position. There is no complicating pneumothorax or new mediastinal widening.  Normal heart size and aortic contours. Intervally increased density of asymmetric to the left airspace disease.  IMPRESSION: 1. New left IJ central line, tip in good position.  No pneumothorax. 2. Increased pneumonia or noncardiogenic edema.   Electronically Signed   By: Monte Fantasia M.D.   On: 06/04/2014 13:23   Dg Chest Port 1 View  06/04/2014   CLINICAL DATA:  Intubation.  EXAM: PORTABLE CHEST - 1 VIEW  COMPARISON:  None.  FINDINGS: Endotracheal tube noted with tip 2.7 cm above the carina. NG tube noted with tip below left hemidiaphragm. Dense bilateral pulmonary infiltrates again noted. Interim partial clearing of right lower lobe atelectasis. Small right pleural effusion noted. Heart size normal. No pneumothorax. No acute bony abnormality .  IMPRESSION: 1. Interim intubation, endotracheal tube tip 2.7 cm above the carina. NG tube in stable position. 2. Persistent dense bilateral pulmonary infiltrates and small right pleural effusion. Interim partial clearing of right lower lobe atelectasis.   Electronically Signed   By: Marcello Moores  Register   On: 06/04/2014 09:41   Dg Chest Port 1  View  06/04/2014   CLINICAL DATA:  Shortness of Breath  EXAM: PORTABLE CHEST - 1 VIEW  COMPARISON:  June 02, 2014  FINDINGS: There is now extensive patchy airspace consolidation bilaterally. There is also consolidation in portions of the right middle and lower lobes. The heart size is normal. The pulmonary vascular is normal.  The pneumoperitoneum seen 1 day prior is not appreciable currently. There are surgical clips in the upper abdomen. Nasogastric tube tip and side port are in the stomach.  IMPRESSION: Extensive airspace consolidation bilaterally. Volume loss and significant portions of the right middle and lower lobes. Obstruction of the right bronchus intermedius is questioned. This finding may warrant bronchoscopy. Differential considerations for the current findings include pneumonia, aspiration, or  possibly noncardiogenic edema. More than 1 of these entities may exist concurrently. Nasogastric tube tip and side port in stomach.   Electronically Signed   By: Lowella Grip III M.D.   On: 06/04/2014 08:30        ASSESSMENT / PLAN:  PULMONARY OETT  06/04/2014 >>  A: Acute Post OP resp failure with sudden onste new bilateral pulmonary infiltrates 06/04/14 . Associated with pink frothy secretions  - DDX:   - Acute Pulmonary Edema due to cardiac event  - HCAP (doubt)  - High risk opportunistic pathogens (doubt)             - ARDS  P:   Full vent support  CARDIOVASCULAR CVL planned 3/30>>  A:  Hx of MI s/p stents in 1980s during age 1s Current  - concern for acute pulmonary edema   - post op hypotension +  ( at risk for realtive adrenal insuff )  P:  Stat echo>>ok Rule out MI>>ruled out Check bnp 2.3 Rule out sepsis>>++procal Start vasopressor for MAP > 65 DC  Diprivan if unable to wean pressors and use fet drip and versed Cards seeing  RENAL A:  Intact but at risk for AKI P:   Maintain volume and hemodynamics  GASTROINTESTINAL A:   Active chron's patient S/p  p-lap 328/16 for colonic perf P:   Per ccs  HEMATOLOGIC A:  AT risk for anemia of critical illness P:  PRBC per ICU guidelines  INFECTIOUS BCx2 >> UC >> Tracheal aspirate for bacteria and virus >> PCT>>9.78 Lactat>>1.2  A:   Baseline  - Immunocmpromised patient due to humira + prednisone Current   - admit with bowel perf / peritonitis 06/02/14  - pulmonary event -06/04/14 doubt infectious  P:   Pan culture as above Zosyn 3/29>> Vanc 3/31 >> dependinng on course might need bronch bal for opportunistic pathogens  ENDOCRINE A:  Chronic pred P:   Stress dose hydrocort - aim to taper asap due to bowel perf surgery SSI  NEUROLOGIC A:   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 Cards following now. Note she is on diprivan and neo.  Richardson Landry Tyishia Aune ACNP Maryanna Shape PCCM Pager [phone] till 3 pm If no answer page [phone] 06/05/2014, 10:17 AM

## 2014-06-05 NOTE — Progress Notes (Signed)
Per S.Minor,NP leave pt on 7ML and increase RR to 35 on vent due to ABG.

## 2014-06-05 NOTE — Progress Notes (Signed)
INITIAL NUTRITION ASSESSMENT  DOCUMENTATION CODES Per approved criteria  -Severe malnutrition in the context of acute illness or injury  Pt meets criteria for severe MALNUTRITION in the context of acute illness as evidenced severe fat and muscle wasting  INTERVENTION: If unable to extubate:  Initiate Vital AF 1.2 @ 20 ml/hr via OGT and increase by 10 ml every 4 hours to goal rate of 45 ml/hr.    Tube feeding regimen provides 1296 kcal (97% of needs), 81 grams of protein, and 875 ml of H2O.   RD to continue to monitor   NUTRITION DIAGNOSIS: Inadequate oral intake related to inability to eat as evidenced by NPO.    Goal: Pt to meet >/= 90% of their estimated energy requirements  Monitor:  TF regimen & tolerance, respiratory status, weight, labs, I/O's  Reason for Assessment: New vent patient  59 y.o. female  Admitting Dx: <principal problem not specified>  ASSESSMENT: 67 yoF admitted with colonic bowel perforation. S/p ex lap with partial colectomy 3/28. In ICU with postop respiratory failure on 3/30, requiring emergent intubation. CXR showed bilateral infiltrates suggestive of acute pulmonary edema. WBC elevated and patient is febrile. Patient has a long history of Crohn's disease with at least 2 previous partial colectomies and left-sided ostomy.  Patient is currently intubated on ventilator support  MV:11.4 L/min Temp (24hrs), Avg: 99 F (37.1 C), Min:98.3 F (36.8 C), Max: 99.7 F (37.6 C) Propofol: none Labs reviewed: Glu 109, Ca 7.5 Nutrition Focused Physical Exam:  Subcutaneous Fat:  Orbital Region: mild to moderate depletion Upper Arm Region: severe depletion Thoracic and Lumbar Region: n/a  Muscle:  Temple Region: severe depletion Clavicle Bone Region: moderate dpletion Clavicle and Acromion Bone Region: moderate depletion Scapular Bone Region: moderatee to severe depletion Dorsal Hand: severe depletion Patellar Region: moderate to severe  depletion Anterior Thigh Region: moderate depletion Posterior Calf Region: severe depletion  Edema: none noted (per chart notes pt is over 8 L fluid positive)  Height: Ht Readings from Last 1 Encounters:  06/04/14 5' (1.524 m)    Weight: Wt Readings from Last 1 Encounters:  06/05/14 122 lb 9.2 oz (55.6 kg)    Ideal Body Weight: 100 Lb  % Ideal Body Weight: 122%  Wt Readings from Last 10 Encounters:  06/05/14 122 lb 9.2 oz (55.6 kg)  06/02/14 101 lb (45.813 kg)  05/02/14 101 lb 12.8 oz (46.176 kg)  03/27/14 103 lb 6 oz (46.891 kg)  09/24/13 84 lb 12.8 oz (38.465 kg)  08/29/13 80 lb 6.4 oz (36.469 kg)  07/12/13 81 lb 6.4 oz (36.923 kg)  06/17/13 85 lb (38.556 kg)  06/17/13 85 lb 0.6 oz (38.574 kg)  05/21/13 95 lb 2 oz (43.148 kg)    Usual Body Weight: 100 Lb  % Usual Body Weight: 122%  BMI:  Body mass index is 23.94 kg/(m^2).  Estimated Nutritional Needs: Kcal: 1340 kcal Protein: 80 - 90g Fluid: per MD  Skin: WDL  Diet Order: Diet NPO time specified Except for: Ice Chips  EDUCATION NEEDS: -Education not appropriate at this time   Intake/Output Summary (Last 24 hours) at 06/05/14 1353 Last data filed at 06/05/14 1000  Gross per 24 hour  Intake 5257.35 ml  Output   2115 ml  Net 3142.35 ml    Last BM: 03/31 (50 ml via ostomy bag)  Labs:   Recent Labs Lab 06/04/14 0335 06/04/14 1030 06/05/14 0500  NA 138 137 133*  K 3.9 3.6 4.1  CL 108 107 105  CO2 24 24 24   BUN 10 9 10   CREATININE 0.87 0.93 0.83  CALCIUM 7.5* 7.2* 6.8*  MG  --  1.3* 2.4  PHOS  --  2.0* 2.3  GLUCOSE 109* 103* 143*    CBG (last 3)   Recent Labs  06/04/14 2341 06/05/14 0335 06/05/14 0814  GLUCAP 159* 144* 153*    Scheduled Meds: . antiseptic oral rinse  7 mL Mouth Rinse QID  . aspirin  81 mg Per NG tube Daily  . calcium gluconate  1 g Intravenous Once  . chlorhexidine  15 mL Mouth Rinse BID  . fentaNYL  50 mcg Intravenous Once  . heparin subcutaneous  5,000  Units Subcutaneous 3 times per day  . hydrocortisone sod succinate (SOLU-CORTEF) inj  100 mg Intravenous Q12H  . insulin aspart  2-6 Units Subcutaneous 6 times per day  . pantoprazole (PROTONIX) IV  40 mg Intravenous Q24H  . piperacillin-tazobactam (ZOSYN)  IV  3.375 g Intravenous Q8H  . vancomycin  500 mg Intravenous Q12H    Continuous Infusions: . dextrose 5 % and 0.9 % NaCl with KCl 20 mEq/L 125 mL/hr at 06/05/14 0929  . fentaNYL infusion INTRAVENOUS 125 mcg/hr (06/05/14 1301)  . midazolam (VERSED) infusion    . phenylephrine (NEO-SYNEPHRINE) Adult infusion 100 mcg/min (06/05/14 0235)    Past Medical History  Diagnosis Date  . Coronary atherosclerosis of unspecified type of vessel, native or graft   . Anal fistula   . Arthritis   . Personal history of colonic polyps   . Crohn's colitis   . History of colostomy   . Heart murmur   . Hx of myocardial infarction 1986    Past Surgical History  Procedure Laterality Date  . Cholecystectomy    . Colon resection  1988    1988 she thinks  . Angioplasty      1987  . Appendectomy    . Tubal ligation  1993  . Colostomy  2012  . Irrigation and debridement abscess N/A 06/18/2013    Procedure: Irrigation and Debridiment of peristomal abcess;  Surgeon: Gayland Curry, MD;  Location: [facility];  Service: General;  Laterality: N/A;  . Laparotomy N/A 06/02/2014    Procedure: EXPLORATORY LAPAROTOMY/PARTIAL COLECTOMY COLOSTOMY;  Surgeon: Excell Seltzer, MD;  Location: [facility];  Service: General;  Laterality: N/A;    Telitha Plath A. [facility] Dietetic Intern Pager: [phone] 06/05/2014 2:09 PM

## 2014-06-05 NOTE — Progress Notes (Signed)
Two rt,s attempted aline was not successful, MD aware. MD states he will have NP to places a femoral. Pt had no adverse reaction to procedure.

## 2014-06-05 NOTE — Progress Notes (Addendum)
Patient desaturated into the mid 70's despite being on 100% FIO2. Patient  maintained in  mid 70's for approx 1 minute. Crackles are now present in lung sounds. Patient was then bagged via BVM and oxygen saturation returned to high 80's low 90's. When patient was connected back to ventilator, patient  began to desaturate again into mid 80's. CCM contacted. Per CCM adjust PEEP to maintain SATS 88 or above. CVP ordered

## 2014-06-05 NOTE — Progress Notes (Signed)
ANTICOAGULATION CONSULT NOTE - Follow Up Consult  Pharmacy Consult for Heparin Indication: chest pain/ACS  Allergies  Allergen Reactions  . Remicade [Infliximab]     convulsions    Patient Measurements: Height: 5' (152.4 cm) Weight: 122 lb 9.2 oz (55.6 kg) IBW/kg (Calculated) : 45.5 Heparin Dosing Weight:   Vital Signs: Temp: 99.8 F (37.7 C) (03/31 0400) Temp Source: Oral (03/31 0400) BP: 106/41 mmHg (03/31 0515) Pulse Rate: 72 (03/31 0515)  Labs:  Recent Labs  06/04/14 0335  06/04/14 0909 06/04/14 1030 06/04/14 1530 06/04/14 2010 06/05/14 0500  HGB 9.6*  --   --  10.8*  --   --  10.1*  HCT 30.0*  --   --  34.6*  --   --  32.2*  PLT 308  --   --  310  --   --  363  APTT  --   --   --  42*  --   --   --   LABPROT  --   --   --  15.0  --   --   --   INR  --   --   --  1.17  --   --   --   HEPARINUNFRC  --   --   --   --   --  0.10* 0.25*  CREATININE 0.87  --   --  0.93  --   --  0.83  CKTOTAL  --   --  1246*  --   --   --   --   CKMB  --   --  18.7*  --   --   --   --   TROPONINI  --   < > 0.12* 0.18* 0.31* 0.22* 0.14*  < > = values in this interval not displayed.  Estimated Creatinine Clearance: 57 mL/min (by C-G formula based on Cr of 0.83).   Medications:  Infusions:  . dextrose 5 % and 0.9 % NaCl with KCl 20 mEq/L 125 mL/hr at 06/05/14 0156  . fentaNYL infusion INTRAVENOUS 350 mcg/hr (06/05/14 0235)  . heparin    . phenylephrine (NEO-SYNEPHRINE) Adult infusion 100 mcg/min (06/05/14 0235)  . propofol 45 mcg/kg/min (06/05/14 0247)    Assessment: Patient with low heparin level.  No issues per RN.  Goal of Therapy:  Heparin level 0.3-0.5 units/ml Monitor platelets by anticoagulation protocol: Yes   Plan:  Increase heparin to 900 units/hr Recheck level at [address] [place] 06/05/2014,6:24 AM

## 2014-06-05 NOTE — Progress Notes (Signed)
eLink Physician-Brief Progress Note Patient Name: [name] DOB: 1955-12-16 MRN: [id number]   Date of Service  06/05/2014  HPI/Events of Note  Sats decreased into 80's. CXR from today c/w lung injury/non cardiogenic edema. Suspect shunt pathophysiology.   eICU Interventions  PEEP increased to 12.      Intervention Category Major Interventions: Hypoxemia - evaluation and management  Sommer,Steven Cornelia Copa 06/05/2014, 12:57 AM

## 2014-06-05 NOTE — Progress Notes (Signed)
PT extremely asynch with vent, breathing 10 breaths faster then set rate breath stacking and fighting against ventilator. Sedation increased.

## 2014-06-05 NOTE — Telephone Encounter (Signed)
Letter created, signed and placed up front for pick up  [name] made aware  Nothing further needed

## 2014-06-05 NOTE — Progress Notes (Signed)
ROSE study initiated. Currently a RASS -2. On versed 2mg /hr; Fentanyl at 490mcg/hr; Neo at 74mcq/min; PIVF at 155ml/hr. Respiratory rate taken  from Vent.

## 2014-06-05 NOTE — Progress Notes (Signed)
Patient ID: [name], female   DOB: 1955/07/31, 59 y.o.   MRN: [id number]     [place]      [address] [place], [place] [address]    Phone: [phone] FAX: [phone]     Subjective: Sedated on vent.  Continues to have good ostomy output.  On neo, heparin gtt.   Adequate UOP.  Labs reviewed.    Objective:  Vital signs:  Filed Vitals:   06/05/14 0545 06/05/14 0600 06/05/14 0615 06/05/14 0630  BP: 119/75 109/51 120/40 111/41  Pulse: 66 70 66 71  Temp:      TempSrc:      Resp: _0 Height:      Weight:      SpO2: 96% 96% 96% 97%    Last BM Date: 06/04/14 (ostomy)  Intake/Output   Yesterday:  03/30 0701 - 03/31 0700 In: 6498.5 [I.V.:4828.5; IV Piggyback:650] Out: 2490 [Urine:2035; Emesis/NG output:250; Drains:5; Stool:200] This shift:    I/O last 3 completed shifts: In: 8081.9 [I.V.:6411.9; Other:1020; IV Piggyback:650] Out: 3015 [Urine:2560; Emesis/NG output:250; Drains:5; Stool:200]     Physical Exam: General: Pt sedated on vent. Chest: vent, coarse.  CV:  Pulses intact.  Regular rhythm MS: Normal AROM mjr joints.  No obvious deformity Abdomen: Soft.  Nondistended. Midline wound VAC.  Ostomy--liquid stool.   No evidence of peritonitis.  No incarcerated hernias. Ext:  SCDs BLE.  No mjr edema.  No cyanosis Skin: No petechiae / purpura   Problem List:   Active Problems:   Perforation of colon   Crohn's colitis   Acute respiratory failure with hypoxia   Acute pulmonary edema   Acute respiratory failure with hypoxemia   Elevated troponin    Results:   Labs: Results for orders placed or performed during the hospital encounter of 06/02/14 (from the past 48 hour(s))  CBC     Status: Abnormal   Collection Time: 06/04/14  3:35 AM  Result Value Ref Range   WBC 14.5 (H) 4.0 - 10.5 K/uL   RBC 3.15 (L) 3.87 - 5.11 MIL/uL   Hemoglobin 9.6 (L) 12.0 - 15.0 g/dL   HCT 30.0 (L) 36.0 - 46.0 %    MCV 95.2 78.0 - 100.0 fL   MCH 30.5 26.0 - 34.0 pg   MCHC 32.0 30.0 - 36.0 g/dL   RDW 13.1 11.5 - 15.5 %   Platelets 308 150 - 400 K/uL  Basic metabolic panel     Status: Abnormal   Collection Time: 06/04/14  3:35 AM  Result Value Ref Range   Sodium 138 135 - 145 mmol/L   Potassium 3.9 3.5 - 5.1 mmol/L   Chloride 108 96 - 112 mmol/L   CO2 24 19 - 32 mmol/L   Glucose, Bld 109 (H) 70 - 99 mg/dL   BUN 10 6 - 23 mg/dL   Creatinine, Ser 0.87 0.50 - 1.10 mg/dL   Calcium 7.5 (L) 8.4 - 10.5 mg/dL   GFR calc non Af Amer 72 (L) >90 mL/min   GFR calc Af Amer 83 (L) >90 mL/min    Comment: (NOTE) The eGFR has been calculated using the CKD EPI equation. This calculation has not been validated in all clinical situations. eGFR's persistently <90 mL/min signify possible Chronic Kidney Disease.    Anion gap 6 5 - 15  Troponin I     Status: Abnormal   Collection Time: 06/04/14  9:09 AM  Result Value  Ref Range   Troponin I 0.12 (H) <0.031 ng/mL    Comment:        PERSISTENTLY INCREASED TROPONIN VALUES IN THE RANGE OF 0.04-0.49 ng/mL CAN BE SEEN IN:       -UNSTABLE ANGINA       -CONGESTIVE HEART FAILURE       -MYOCARDITIS       -CHEST TRAUMA       -ARRYHTHMIAS       -LATE PRESENTING MYOCARDIAL INFARCTION       -COPD   CLINICAL FOLLOW-UP RECOMMENDED.    CK total and CKMB (cardiac)     Status: Abnormal   Collection Time: 06/04/14  9:09 AM  Result Value Ref Range   Total CK 1246 (H) 7 - 177 U/L   CK, MB 18.7 (HH) 0.3 - 4.0 ng/mL    Comment: REPEATED TO VERIFY CRITICAL RESULT CALLED TO, READ BACK BY AND VERIFIED WITH: D.SHEPHERD,RN 06/04/14 1341 BY BSLADE    Relative Index 1.5 0.0 - 2.5    Comment: Performed at [facility]  Brain natriuretic peptide     Status: Abnormal   Collection Time: 06/04/14  9:09 AM  Result Value Ref Range   B Natriuretic Peptide 409.2 (H) 0.0 - 100.0 pg/mL  Troponin I     Status: Abnormal   Collection Time: 06/04/14 10:30 AM  Result Value Ref Range    Troponin I 0.18 (H) <0.031 ng/mL    Comment:        PERSISTENTLY INCREASED TROPONIN VALUES IN THE RANGE OF 0.04-0.49 ng/mL CAN BE SEEN IN:       -UNSTABLE ANGINA       -CONGESTIVE HEART FAILURE       -MYOCARDITIS       -CHEST TRAUMA       -ARRYHTHMIAS       -LATE PRESENTING MYOCARDIAL INFARCTION       -COPD   CLINICAL FOLLOW-UP RECOMMENDED.   Procalcitonin - Baseline     Status: None   Collection Time: 06/04/14 10:30 AM  Result Value Ref Range   Procalcitonin 12.31 ng/mL    Comment:        Interpretation: PCT >= 10 ng/mL: Important systemic inflammatory response, almost exclusively due to severe bacterial sepsis or septic shock. (NOTE)         ICU PCT Algorithm               Non ICU PCT Algorithm    ----------------------------     ------------------------------         PCT < 0.25 ng/mL                 PCT < 0.1 ng/mL     Stopping of antibiotics            Stopping of antibiotics       strongly encouraged.               strongly encouraged.    ----------------------------     ------------------------------       PCT level decrease by               PCT < 0.25 ng/mL       >= 80% from peak PCT       OR PCT 0.25 - 0.5 ng/mL          Stopping of antibiotics  encouraged.     Stopping of antibiotics           encouraged.    ----------------------------     ------------------------------       PCT level decrease by              PCT >= 0.25 ng/mL       < 80% from peak PCT        AND PCT >= 0.5 ng/mL             Continuing antibiotics                                              encouraged.       Continuing antibiotics            encouraged.    ----------------------------     ------------------------------     PCT level increase compared          PCT > 0.5 ng/mL         with peak PCT AND          PCT >= 0.5 ng/mL             Escalation of antibiotics                                          strongly encouraged.      Escalation of  antibiotics        strongly encouraged.   Phosphorus     Status: Abnormal   Collection Time: 06/04/14 10:30 AM  Result Value Ref Range   Phosphorus 2.0 (L) 2.3 - 4.6 mg/dL  Magnesium     Status: Abnormal   Collection Time: 06/04/14 10:30 AM  Result Value Ref Range   Magnesium 1.3 (L) 1.5 - 2.5 mg/dL  CBC with Differential/Platelet     Status: Abnormal   Collection Time: 06/04/14 10:30 AM  Result Value Ref Range   WBC 15.2 (H) 4.0 - 10.5 K/uL   RBC 3.60 (L) 3.87 - 5.11 MIL/uL   Hemoglobin 10.8 (L) 12.0 - 15.0 g/dL   HCT 34.6 (L) 36.0 - 46.0 %   MCV 96.1 78.0 - 100.0 fL   MCH 30.0 26.0 - 34.0 pg   MCHC 31.2 30.0 - 36.0 g/dL   RDW 13.2 11.5 - 15.5 %   Platelets 310 150 - 400 K/uL   Neutrophils Relative % 92 (H) 43 - 77 %   Neutro Abs 14.0 (H) 1.7 - 7.7 K/uL   Lymphocytes Relative 5 (L) 12 - 46 %   Lymphs Abs 0.7 0.7 - 4.0 K/uL   Monocytes Relative 3 3 - 12 %   Monocytes Absolute 0.5 0.1 - 1.0 K/uL   Eosinophils Relative 0 0 - 5 %   Eosinophils Absolute 0.0 0.0 - 0.7 K/uL   Basophils Relative 0 0 - 1 %   Basophils Absolute 0.0 0.0 - 0.1 K/uL  Basic metabolic panel     Status: Abnormal   Collection Time: 06/04/14 10:30 AM  Result Value Ref Range   Sodium 137 135 - 145 mmol/L   Potassium 3.6 3.5 - 5.1 mmol/L   Chloride 107 96 - 112 mmol/L   CO2 24 19 - 32 mmol/L   Glucose, Bld 103 (H)  70 - 99 mg/dL   BUN 9 6 - 23 mg/dL   Creatinine, Ser 0.93 0.50 - 1.10 mg/dL   Calcium 7.2 (L) 8.4 - 10.5 mg/dL   GFR calc non Af Amer 66 (L) >90 mL/min   GFR calc Af Amer 76 (L) >90 mL/min    Comment: (NOTE) The eGFR has been calculated using the CKD EPI equation. This calculation has not been validated in all clinical situations. eGFR's persistently <90 mL/min signify possible Chronic Kidney Disease.    Anion gap 6 5 - 15  Hepatic function panel     Status: Abnormal   Collection Time: 06/04/14 10:30 AM  Result Value Ref Range   Total Protein 5.3 (L) 6.0 - 8.3 g/dL   Albumin 2.3 (L)  3.5 - 5.2 g/dL   AST 62 (H) 0 - 37 U/L   ALT 18 0 - 35 U/L   Alkaline Phosphatase 50 39 - 117 U/L   Total Bilirubin 0.7 0.3 - 1.2 mg/dL   Bilirubin, Direct 0.2 0.0 - 0.5 mg/dL   Indirect Bilirubin 0.5 0.3 - 0.9 mg/dL  Protime-INR     Status: None   Collection Time: 06/04/14 10:30 AM  Result Value Ref Range   Prothrombin Time 15.0 11.6 - 15.2 seconds   INR 1.17 0.00 - 1.49  APTT     Status: Abnormal   Collection Time: 06/04/14 10:30 AM  Result Value Ref Range   aPTT 42 (H) 24 - 37 seconds    Comment:        IF BASELINE aPTT IS ELEVATED, SUGGEST PATIENT RISK ASSESSMENT BE USED TO DETERMINE APPROPRIATE ANTICOAGULANT THERAPY.   Lactic acid, plasma     Status: None   Collection Time: 06/04/14 10:40 AM  Result Value Ref Range   Lactic Acid, Venous 1.2 0.5 - 2.0 mmol/L  Brain natriuretic peptide     Status: Abnormal   Collection Time: 06/04/14 10:40 AM  Result Value Ref Range   B Natriuretic Peptide 230.4 (H) 0.0 - 100.0 pg/mL  Blood gas, arterial     Status: Abnormal   Collection Time: 06/04/14 10:49 AM  Result Value Ref Range   FIO2 1.00 %   Delivery systems VENTILATOR    Mode PRESSURE REGULATED VOLUME CONTROL    VT 360 mL   Rate 14 resp/min   Peep/cpap 5.0 cm H20   pH, Arterial 7.302 (L) 7.350 - 7.450   pCO2 arterial 49.3 (H) 35.0 - 45.0 mmHg   pO2, Arterial 69.5 (L) 80.0 - 100.0 mmHg   Bicarbonate 23.3 20.0 - 24.0 mEq/L   TCO2 22.0 0 - 100 mmol/L   Acid-base deficit 2.4 (H) 0.0 - 2.0 mmol/L   O2 Saturation 90.3 %   Patient temperature 100.3    Collection site RIGHT BRACHIAL    Drawn by [phone]    Sample type ARTERIAL   Glucose, capillary     Status: Abnormal   Collection Time: 06/04/14 11:22 AM  Result Value Ref Range   Glucose-Capillary 119 (H) 70 - 99 mg/dL   Comment 1 Notify RN    Comment 2 Document in Chart   Troponin I     Status: Abnormal   Collection Time: 06/04/14  3:30 PM  Result Value Ref Range   Troponin I 0.31 (H) <0.031 ng/mL    Comment:         PERSISTENTLY INCREASED TROPONIN VALUES IN THE RANGE OF 0.04-0.49 ng/mL CAN BE SEEN IN:       -UNSTABLE ANGINA       -  CONGESTIVE HEART FAILURE       -MYOCARDITIS       -CHEST TRAUMA       -ARRYHTHMIAS       -LATE PRESENTING MYOCARDIAL INFARCTION       -COPD   CLINICAL FOLLOW-UP RECOMMENDED.   Glucose, capillary     Status: Abnormal   Collection Time: 06/04/14  4:06 PM  Result Value Ref Range   Glucose-Capillary 235 (H) 70 - 99 mg/dL   Comment 1 Notify RN    Comment 2 Document in Chart   Troponin I     Status: Abnormal   Collection Time: 06/04/14  8:10 PM  Result Value Ref Range   Troponin I 0.22 (H) <0.031 ng/mL    Comment:        PERSISTENTLY INCREASED TROPONIN VALUES IN THE RANGE OF 0.04-0.49 ng/mL CAN BE SEEN IN:       -UNSTABLE ANGINA       -CONGESTIVE HEART FAILURE       -MYOCARDITIS       -CHEST TRAUMA       -ARRYHTHMIAS       -LATE PRESENTING MYOCARDIAL INFARCTION       -COPD   CLINICAL FOLLOW-UP RECOMMENDED.   Heparin level (unfractionated)     Status: Abnormal   Collection Time: 06/04/14  8:10 PM  Result Value Ref Range   Heparin Unfractionated 0.10 (L) 0.30 - 0.70 IU/mL    Comment:        IF HEPARIN RESULTS ARE BELOW EXPECTED VALUES, AND PATIENT DOSAGE HAS BEEN CONFIRMED, SUGGEST FOLLOW UP TESTING OF ANTITHROMBIN III LEVELS.   Triglycerides     Status: None   Collection Time: 06/04/14  8:10 PM  Result Value Ref Range   Triglycerides 104 <150 mg/dL    Comment: Performed at [facility]  Glucose, capillary     Status: Abnormal   Collection Time: 06/04/14  8:40 PM  Result Value Ref Range   Glucose-Capillary 163 (H) 70 - 99 mg/dL  Troponin I     Status: Abnormal   Collection Time: 06/05/14  5:00 AM  Result Value Ref Range   Troponin I 0.14 (H) <0.031 ng/mL    Comment:        PERSISTENTLY INCREASED TROPONIN VALUES IN THE RANGE OF 0.04-0.49 ng/mL CAN BE SEEN IN:       -UNSTABLE ANGINA       -CONGESTIVE HEART FAILURE       -MYOCARDITIS        -CHEST TRAUMA       -ARRYHTHMIAS       -LATE PRESENTING MYOCARDIAL INFARCTION       -COPD   CLINICAL FOLLOW-UP RECOMMENDED.   Procalcitonin     Status: None   Collection Time: 06/05/14  5:00 AM  Result Value Ref Range   Procalcitonin 9.78 ng/mL    Comment:        Interpretation: PCT > 2 ng/mL: Systemic infection (sepsis) is likely, unless other causes are known. (NOTE)         ICU PCT Algorithm               Non ICU PCT Algorithm    ----------------------------     ------------------------------         PCT < 0.25 ng/mL                 PCT < 0.1 ng/mL     Stopping of antibiotics  Stopping of antibiotics       strongly encouraged.               strongly encouraged.    ----------------------------     ------------------------------       PCT level decrease by               PCT < 0.25 ng/mL       >= 80% from peak PCT       OR PCT 0.25 - 0.5 ng/mL          Stopping of antibiotics                                             encouraged.     Stopping of antibiotics           encouraged.    ----------------------------     ------------------------------       PCT level decrease by              PCT >= 0.25 ng/mL       < 80% from peak PCT        AND PCT >= 0.5 ng/mL            Continuing antibiotics                                               encouraged.       Continuing antibiotics            encouraged.    ----------------------------     ------------------------------     PCT level increase compared          PCT > 0.5 ng/mL         with peak PCT AND          PCT >= 0.5 ng/mL             Escalation of antibiotics                                          strongly encouraged.      Escalation of antibiotics        strongly encouraged.   CBC with Differential/Platelet     Status: Abnormal   Collection Time: 06/05/14  5:00 AM  Result Value Ref Range   WBC 22.2 (H) 4.0 - 10.5 K/uL   RBC 3.34 (L) 3.87 - 5.11 MIL/uL   Hemoglobin 10.1 (L) 12.0 - 15.0 g/dL   HCT 32.2 (L)  36.0 - 46.0 %   MCV 96.4 78.0 - 100.0 fL   MCH 30.2 26.0 - 34.0 pg   MCHC 31.4 30.0 - 36.0 g/dL   RDW 13.4 11.5 - 15.5 %   Platelets 363 150 - 400 K/uL   Neutrophils Relative % 94 (H) 43 - 77 %   Neutro Abs 20.7 (H) 1.7 - 7.7 K/uL   Lymphocytes Relative 3 (L) 12 - 46 %   Lymphs Abs 0.7 0.7 - 4.0 K/uL   Monocytes Relative 3 3 - 12 %   Monocytes Absolute 0.8 0.1 - 1.0 K/uL   Eosinophils Relative 0 0 - 5 %  Eosinophils Absolute 0.0 0.0 - 0.7 K/uL   Basophils Relative 0 0 - 1 %   Basophils Absolute 0.0 0.0 - 0.1 K/uL  Basic metabolic panel     Status: Abnormal   Collection Time: 06/05/14  5:00 AM  Result Value Ref Range   Sodium 133 (L) 135 - 145 mmol/L   Potassium 4.1 3.5 - 5.1 mmol/L   Chloride 105 96 - 112 mmol/L   CO2 24 19 - 32 mmol/L   Glucose, Bld 143 (H) 70 - 99 mg/dL   BUN 10 6 - 23 mg/dL   Creatinine, Ser 0.83 0.50 - 1.10 mg/dL   Calcium 6.8 (L) 8.4 - 10.5 mg/dL   GFR calc non Af Amer 76 (L) >90 mL/min   GFR calc Af Amer 88 (L) >90 mL/min    Comment: (NOTE) The eGFR has been calculated using the CKD EPI equation. This calculation has not been validated in all clinical situations. eGFR's persistently <90 mL/min signify possible Chronic Kidney Disease.    Anion gap 4 (L) 5 - 15  Magnesium     Status: None   Collection Time: 06/05/14  5:00 AM  Result Value Ref Range   Magnesium 2.4 1.5 - 2.5 mg/dL  Phosphorus     Status: None   Collection Time: 06/05/14  5:00 AM  Result Value Ref Range   Phosphorus 2.3 2.3 - 4.6 mg/dL  Heparin level (unfractionated)     Status: Abnormal   Collection Time: 06/05/14  5:00 AM  Result Value Ref Range   Heparin Unfractionated 0.25 (L) 0.30 - 0.70 IU/mL    Comment:        IF HEPARIN RESULTS ARE BELOW EXPECTED VALUES, AND PATIENT DOSAGE HAS BEEN CONFIRMED, SUGGEST FOLLOW UP TESTING OF ANTITHROMBIN III LEVELS.     Imaging / Studies: Dg Chest Port 1 View  06/04/2014   CLINICAL DATA:  Evaluate central line placement  EXAM:  PORTABLE CHEST - 1 VIEW  COMPARISON:  Same day at 9:30 a.m.  FINDINGS: New left IJ central line, tip at the upper cavoatrial junction. Endotracheal and orogastric tubes remain in good position. There is no complicating pneumothorax or new mediastinal widening.  Normal heart size and aortic contours. Intervally increased density of asymmetric to the left airspace disease.  IMPRESSION: 1. New left IJ central line, tip in good position.  No pneumothorax. 2. Increased pneumonia or noncardiogenic edema.   Electronically Signed   By: Monte Fantasia M.D.   On: 06/04/2014 13:23   Dg Chest Port 1 View  06/04/2014   CLINICAL DATA:  Intubation.  EXAM: PORTABLE CHEST - 1 VIEW  COMPARISON:  None.  FINDINGS: Endotracheal tube noted with tip 2.7 cm above the carina. NG tube noted with tip below left hemidiaphragm. Dense bilateral pulmonary infiltrates again noted. Interim partial clearing of right lower lobe atelectasis. Small right pleural effusion noted. Heart size normal. No pneumothorax. No acute bony abnormality .  IMPRESSION: 1. Interim intubation, endotracheal tube tip 2.7 cm above the carina. NG tube in stable position. 2. Persistent dense bilateral pulmonary infiltrates and small right pleural effusion. Interim partial clearing of right lower lobe atelectasis.   Electronically Signed   By: Marcello Moores  Register   On: 06/04/2014 09:41   Dg Chest Port 1 View  06/04/2014   CLINICAL DATA:  Shortness of Breath  EXAM: PORTABLE CHEST - 1 VIEW  COMPARISON:  June 02, 2014  FINDINGS: There is now extensive patchy airspace consolidation bilaterally. There is also consolidation in  portions of the right middle and lower lobes. The heart size is normal. The pulmonary vascular is normal.  The pneumoperitoneum seen 1 day prior is not appreciable currently. There are surgical clips in the upper abdomen. Nasogastric tube tip and side port are in the stomach.  IMPRESSION: Extensive airspace consolidation bilaterally. Volume loss and  significant portions of the right middle and lower lobes. Obstruction of the right bronchus intermedius is questioned. This finding may warrant bronchoscopy. Differential considerations for the current findings include pneumonia, aspiration, or possibly noncardiogenic edema. More than 1 of these entities may exist concurrently. Nasogastric tube tip and side port in stomach.   Electronically Signed   By: Lowella Grip III M.D.   On: 06/04/2014 08:30    Medications / Allergies:  Scheduled Meds: . antiseptic oral rinse  7 mL Mouth Rinse q12n4p  . chlorhexidine  15 mL Mouth Rinse BID  . hydrocortisone sod succinate (SOLU-CORTEF) inj  100 mg Intravenous Q12H  . insulin aspart  2-6 Units Subcutaneous 6 times per day  . pantoprazole (PROTONIX) IV  40 mg Intravenous Q24H  . piperacillin-tazobactam (ZOSYN)  IV  3.375 g Intravenous Q8H  . vancomycin  1,000 mg Intravenous Q24H   Continuous Infusions: . dextrose 5 % and 0.9 % NaCl with KCl 20 mEq/L 125 mL/hr at 06/05/14 0156  . fentaNYL infusion INTRAVENOUS 350 mcg/hr (06/05/14 0712)  . heparin 900 Units/hr (06/05/14 0631)  . phenylephrine (NEO-SYNEPHRINE) Adult infusion 100 mcg/min (06/05/14 0235)  . propofol 45 mcg/kg/min (06/05/14 0247)   PRN Meds:.acetaminophen, albuterol, fentaNYL, fentaNYL, ipratropium, metoprolol, midazolam, midazolam, ondansetron **OR** ondansetron (ZOFRAN) IV  Antibiotics: Anti-infectives    Start     Dose/Rate Route Frequency Ordered Stop   06/04/14 1400  vancomycin (VANCOCIN) IVPB 1000 mg/200 mL premix     1,000 mg 200 mL/hr over 60 Minutes Intravenous Every 24 hours 06/04/14 1313     06/03/14 0200  piperacillin-tazobactam (ZOSYN) IVPB 3.375 g     3.375 g 12.5 mL/hr over 240 Minutes Intravenous Every 8 hours 06/02/14 2346     06/02/14 1845  piperacillin-tazobactam (ZOSYN) IVPB 3.375 g     3.375 g 12.5 mL/hr over 240 Minutes Intravenous  Once 06/02/14 1833 06/02/14 2257       Assessment/Plan Crohn's  disease(humira, prednisone) Colon perforation POD#3 exploratory laparotomy/partial colectomy colostomy---Dr. Excell Seltzer  -has ostomy function, clamp NGT.  Check residual in 6h, if minimal, will start trickle TF -wound VAC -IVF -continue foley -continue NGT -fentanyl for pain -zosyn D#3 Respiratory failure-pulmonary edema, HCAP -appreciate CCM management -on full vent support -Vanc/Zosyn -respiratory culture 3/30 -blood cultures 3/30---> AMI -normal LVF per echo -cardiology following -on heparin gtt, neo, troponin's trending down  Erby Pian, ANP-[facility] Pager [phone](7A-4:30P)   06/05/2014 7:50 AM

## 2014-06-05 NOTE — Progress Notes (Signed)
Pt has been placed on ROSE protocol per Dr.Wright .VT 280, RR 35, PEEP 16, FI02 50%. Rt will obtain an ABG in one hour.

## 2014-06-05 NOTE — Progress Notes (Signed)
Daughter [name] updated in detail over phone ARDS explained. Rx of deep sedation + Ventilator Strategy explained. Ptiential need for nimbex through RCT, NIH Trial explained. Prognosis all explained. SHe verbalized agreement and understanding. She will need letter for work and school ; will do   Dr. Brand Males, M.D., [facility].C.P Pulmonary and Critical Care Medicine Staff Physician [facility] Pager: [phone], If no answer or between  15:00h - 7:00h: call [phone]  06/05/2014 2:21 PM

## 2014-06-05 NOTE — Progress Notes (Signed)
ANTIBIOTIC CONSULT NOTE - Follow Up  Pharmacy Consult for Vancomycin Indication: pneumonia  Allergies  Allergen Reactions  . Remicade [Infliximab]     convulsions    Patient Measurements: Height: 5' (152.4 cm) Weight: 122 lb 9.2 oz (55.6 kg) IBW/kg (Calculated) : 45.5  Vital Signs: Temp: 99.8 F (37.7 C) (03/31 0400) Temp Source: Oral (03/31 0400) BP: 111/41 mmHg (03/31 0630) Pulse Rate: 71 (03/31 0630) Intake/Output from previous day: 03/30 0701 - 03/31 0700 In: 6498.5 [I.V.:4828.5; IV Piggyback:650] Out: 2490 [Urine:2035; Emesis/NG output:250; Drains:5; Stool:200] Intake/Output from this shift:    Labs:  Recent Labs  06/04/14 0335 06/04/14 1030 06/05/14 0500  WBC 14.5* 15.2* 22.2*  HGB 9.6* 10.8* 10.1*  PLT 308 310 363  CREATININE 0.87 0.93 0.83   Estimated Creatinine Clearance: 57 mL/min (by C-G formula based on Cr of 0.83). No results for input(s): VANCOTROUGH, VANCOPEAK, VANCORANDOM, GENTTROUGH, GENTPEAK, GENTRANDOM, TOBRATROUGH, TOBRAPEAK, TOBRARND, AMIKACINPEAK, AMIKACINTROU, AMIKACIN in the last 72 hours.   Microbiology: Recent Results (from the past 720 hour(s))  MRSA PCR Screening     Status: None   Collection Time: 06/02/14 11:50 PM  Result Value Ref Range Status   MRSA by PCR NEGATIVE NEGATIVE Final    Comment:        The GeneXpert MRSA Assay (FDA approved for NASAL specimens only), is one component of a comprehensive MRSA colonization surveillance program. It is not intended to diagnose MRSA infection nor to guide or monitor treatment for MRSA infections.     Medical History: Past Medical History  Diagnosis Date  . Coronary atherosclerosis of unspecified type of vessel, native or graft   . Anal fistula   . Arthritis   . Personal history of colonic polyps   . Crohn's colitis   . History of colostomy   . Heart murmur   . Hx of myocardial infarction 1986     Assessment: 44 yoF admitted with colonic bowel perforation. S/p ex lap  with partial colectomy 3/28. In ICU with postop respiratory failure on 3/30, requiring emergent intubation.  CXR showed bilateral infiltrates suggestive of acute pulmonary edema. WBC elevated and patient is febrile. Patient has been on Zosyn per [facility]. PCCM added vancomycin per pharmacy for possible pneumonia.  Note patient is on Humira and prednisone PTA.    3/28 >> Zosyn (MD dosing) >> 3/30 >> Vancomycin >>  Tmax: 102.2 WBC: elevated and increasing (note on stress steroids) Renal: SCr 0.83, CrCl~63 ml/min (CG)  Micro: Blood, urine, respiratory cultures ordered Respiratory virus panel ordered  Today is day #2 Vanc 1g IV q24h and day #4 Zosyn 3.375g IV q8h (4 hour infusion time) for PNA, bowel perforation/peritonitis. PCT elevated, decreasing. Weight increased, slight improvement in SCr - adjusting vanc to q12h dosing (pt was borderline q12h per nomogram when started). Cultures sent, no results yet.  Goal of Therapy:  Vancomycin trough level 15-20 mcg/ml  Plan:  1.  Adjust Vancomycin to 750mg  IV q12h. 2.  F/u SCr, trough level, culture results, clinical course.   [name] 06/05/2014,8:40 AM

## 2014-06-05 NOTE — Telephone Encounter (Signed)
PATient [name] currently  In ICU with septich shock ARDS. Daughter Harte Lovena Le wants sick note . Can you please call her and ask her how she wants letter woreded and type it up. I will sign it and give to her 06/06/14 in ICU

## 2014-06-05 NOTE — Procedures (Signed)
Arterial Catheter Insertion Procedure Note VESTA WHEELAND [id number] 1956-01-14  Procedure: Insertion of Arterial Catheter  Indications: Blood pressure monitoring and Frequent blood sampling  Procedure Details Consent: Unable to obtain consent because of emergent medical necessity. Time Out: Verified patient identification, verified procedure, site/side was marked, verified correct patient position, special equipment/implants available, medications/allergies/relevent history reviewed, required imaging and test results available.  Performed  Maximum sterile technique was used including antiseptics, cap, gloves, gown, hand hygiene, mask and sheet. Skin prep: Chlorhexidine; local anesthetic administered 20 gauge catheter was inserted into right femoral artery using the Seldinger technique.  Evaluation Blood flow good; BP tracing good. Complications: No apparent complications.   Richardson Landry Minor ACNP Maryanna Shape PCCM Pager [phone] till 3 pm If no answer page [phone] 06/05/2014, 12:41 PM

## 2014-06-05 NOTE — Research (Addendum)
Protocol Title: Reevaluation of Systemic Early Neuromuscular Blockade (ROSE)  This patient, [name], has been consented to the above clinical trial according to FDA regulations, GCP guidelines and [facility] SOPs. The study design has been explained to this patient's LAR, [name], by this RN and Dr. Asencion Noble, MD, and the surrogate demonstrated comprehension. No study procedures have been initiated before consenting of this patient/surrogate. This surrogate was given sufficient time for reading the consent and asking questions. All risks, benefits and options have been thoroughly discussed. This patient/surrogate was not coerced in any way to participate or to continue participation in this clinical trial. The surrogate has signed voluntarily at 16:52pm on June 05, 2014. This surrogate was given a copy of this consent.   The patient was randomized to the standard of care sedation arm of the ROSE trial.   Please call/page with questions.  Doreatha Martin, RN   [phone]    Pager [phone]  I have seen and examined this patient and I agree with this randomization. Pt randomized to sedation arm, no NMB  Glyn Ade  [phone]  Cell  [phone]  If no response or cell goes to voicemail, call beeper [phone] 6:11 PM 06/05/2014

## 2014-06-05 NOTE — Progress Notes (Signed)
    Subjective:  Intubated and sedated   Objective:  Filed Vitals:   06/05/14 0545 06/05/14 0600 06/05/14 0615 06/05/14 0630  BP: 119/75 109/51 120/40 111/41  Pulse: 66 70 66 71  Temp:      TempSrc:      Resp: 17 17 18 16   Height:      Weight:      SpO2: 96% 96% 96% 97%    Intake/Output from previous day:  Intake/Output Summary (Last 24 hours) at 06/05/14 0810 Last data filed at 06/05/14 0631  Gross per 24 hour  Intake 6456.85 ml  Output   2490 ml  Net 3966.85 ml    Physical Exam: Physical exam: Well-developed well-nourished, intubated Skin is warm and dry.  HEENT is normal.  Neck is supple. Chest mild diffuse rhonchi Cardiovascular exam is regular rate and rhythm.  Abdominal exam not distended; colostomy Extremities show no edema. neuro intubated and sedated    Lab Results: Basic Metabolic Panel:  Recent Labs  06/04/14 1030 06/05/14 0500  NA 137 133*  K 3.6 4.1  CL 107 105  CO2 24 24  GLUCOSE 103* 143*  BUN 9 10  CREATININE 0.93 0.83  CALCIUM 7.2* 6.8*  MG 1.3* 2.4  PHOS 2.0* 2.3   CBC:  Recent Labs  06/04/14 1030 06/05/14 0500  WBC 15.2* 22.2*  NEUTROABS 14.0* 20.7*  HGB 10.8* 10.1*  HCT 34.6* 32.2*  MCV 96.1 96.4  PLT 310 363   Cardiac Enzymes:  Recent Labs  06/04/14 0909  06/04/14 1530 06/04/14 2010 06/05/14 0500  CKTOTAL 1246*  --   --   --   --   CKMB 18.7*  --   --   --   --   TROPONINI 0.12*  < > 0.31* 0.22* 0.14*  < > = values in this interval not displayed.   Assessment/Plan:  1. Acute hypoxic respiratory failure-BNP only mildly elevated; LV function normal on echo; Troponin minimally elevated with no clear trend; above suggests this is less likely ischemia mediated; CVP 14; diurese as tolerated by BP; possible ALI or infection; CCM managing; on antibiotics. 2. Mildly elevated troponin in the setting of recent acute bowel perforation and respiratory failure. No clear trend; per Dr Radford Pax, patient denied CP (sedated  this AM); would change heparin to SQ; add ASA. Will need ischemia eval (nuclear study) once she improves. 3. Bowel perforation s/p emergent laparotomy 4. Remote CAD with history of PCI in [place] 06/05/2014, 8:10 AM

## 2014-06-05 NOTE — Progress Notes (Signed)
Per MD increase RR to 28 due to ABG.7.14,62.9,74.3,20.7 on PRVC [phone]

## 2014-06-06 ENCOUNTER — Inpatient Hospital Stay (HOSPITAL_COMMUNITY): Payer: Commercial Managed Care - HMO

## 2014-06-06 DIAGNOSIS — E43 Unspecified severe protein-calorie malnutrition: Secondary | ICD-10-CM

## 2014-06-06 DIAGNOSIS — J80 Acute respiratory distress syndrome: Secondary | ICD-10-CM

## 2014-06-06 LAB — CBC WITH DIFFERENTIAL/PLATELET
BASOS ABS: 0 10*3/uL (ref 0.0–0.1)
BASOS PCT: 0 % (ref 0–1)
Eosinophils Absolute: 0 10*3/uL (ref 0.0–0.7)
Eosinophils Relative: 0 % (ref 0–5)
HCT: 28.4 % — ABNORMAL LOW (ref 36.0–46.0)
HEMOGLOBIN: 8.9 g/dL — AB (ref 12.0–15.0)
Lymphocytes Relative: 5 % — ABNORMAL LOW (ref 12–46)
Lymphs Abs: 0.7 10*3/uL (ref 0.7–4.0)
MCH: 30.2 pg (ref 26.0–34.0)
MCHC: 31.3 g/dL (ref 30.0–36.0)
MCV: 96.3 fL (ref 78.0–100.0)
Monocytes Absolute: 0.9 10*3/uL (ref 0.1–1.0)
Monocytes Relative: 6 % (ref 3–12)
NEUTROS PCT: 89 % — AB (ref 43–77)
Neutro Abs: 13.8 10*3/uL — ABNORMAL HIGH (ref 1.7–7.7)
PLATELETS: 307 10*3/uL (ref 150–400)
RBC: 2.95 MIL/uL — ABNORMAL LOW (ref 3.87–5.11)
RDW: 13.9 % (ref 11.5–15.5)
WBC: 15.4 10*3/uL — ABNORMAL HIGH (ref 4.0–10.5)

## 2014-06-06 LAB — RESPIRATORY VIRUS PANEL
Adenovirus: NEGATIVE
INFLUENZA B 1: NEGATIVE
Influenza A: NEGATIVE
METAPNEUMOVIRUS: NEGATIVE
PARAINFLUENZA 3 A: NEGATIVE
Parainfluenza 1: NEGATIVE
Parainfluenza 2: NEGATIVE
RHINOVIRUS: NEGATIVE
Respiratory Syncytial Virus A: NEGATIVE
Respiratory Syncytial Virus B: NEGATIVE

## 2014-06-06 LAB — GLUCOSE, CAPILLARY
GLUCOSE-CAPILLARY: 147 mg/dL — AB (ref 70–99)
GLUCOSE-CAPILLARY: 90 mg/dL (ref 70–99)
Glucose-Capillary: 101 mg/dL — ABNORMAL HIGH (ref 70–99)
Glucose-Capillary: 121 mg/dL — ABNORMAL HIGH (ref 70–99)
Glucose-Capillary: 136 mg/dL — ABNORMAL HIGH (ref 70–99)
Glucose-Capillary: 152 mg/dL — ABNORMAL HIGH (ref 70–99)
Glucose-Capillary: 78 mg/dL (ref 70–99)

## 2014-06-06 LAB — BASIC METABOLIC PANEL
ANION GAP: 3 — AB (ref 5–15)
BUN: 10 mg/dL (ref 6–23)
CALCIUM: 7.1 mg/dL — AB (ref 8.4–10.5)
CO2: 19 mmol/L (ref 19–32)
Chloride: 115 mmol/L — ABNORMAL HIGH (ref 96–112)
Creatinine, Ser: 0.86 mg/dL (ref 0.50–1.10)
GFR calc Af Amer: 84 mL/min — ABNORMAL LOW (ref >90)
GFR calc non Af Amer: 73 mL/min — ABNORMAL LOW (ref >90)
GLUCOSE: 153 mg/dL — AB (ref 70–99)
POTASSIUM: 5 mmol/L (ref 3.5–5.1)
SODIUM: 137 mmol/L (ref 135–145)

## 2014-06-06 LAB — BLOOD GAS, ARTERIAL
Acid-base deficit: 8.3 mmol/L — ABNORMAL HIGH (ref 0.0–2.0)
BICARBONATE: 17.8 meq/L — AB (ref 20.0–24.0)
DRAWN BY: 308601
FIO2: 0.4 %
O2 Saturation: 97.8 %
PEEP: 14 cmH2O
PO2 ART: 145 mmHg — AB (ref 80.0–100.0)
Patient temperature: 98.6
RATE: 35 resp/min
TCO2: 17.3 mmol/L (ref 0–100)
VT: 280 mL
pCO2 arterial: 42 mmHg (ref 35.0–45.0)
pH, Arterial: 7.251 — ABNORMAL LOW (ref 7.350–7.450)

## 2014-06-06 LAB — PROTIME-INR
INR: 1 (ref 0.00–1.49)
PROTHROMBIN TIME: 13.3 s (ref 11.6–15.2)

## 2014-06-06 LAB — CULTURE, RESPIRATORY

## 2014-06-06 LAB — CULTURE, RESPIRATORY W GRAM STAIN

## 2014-06-06 LAB — PROCALCITONIN: Procalcitonin: 5.49 ng/mL

## 2014-06-06 LAB — MAGNESIUM: MAGNESIUM: 2.4 mg/dL (ref 1.5–2.5)

## 2014-06-06 LAB — APTT: aPTT: 34 seconds (ref 24–37)

## 2014-06-06 LAB — PHOSPHORUS: Phosphorus: 2 mg/dL — ABNORMAL LOW (ref 2.3–4.6)

## 2014-06-06 MED ORDER — DEXTROSE-NACL 5-0.45 % IV SOLN
INTRAVENOUS | Status: DC
Start: 2014-06-06 — End: 2014-06-23
  Administered 2014-06-06 – 2014-06-16 (×4): via INTRAVENOUS

## 2014-06-06 MED ORDER — POTASSIUM PHOSPHATES 15 MMOLE/5ML IV SOLN: 10.0000 mmol | Freq: Once | INTRAVENOUS | Status: DC

## 2014-06-06 MED ORDER — CISATRACURIUM BOLUS VIA INFUSION
0.0500 mg/kg | Freq: Once | INTRAVENOUS | Status: AC
  Administered 2014-06-06: 2.8 mg via INTRAVENOUS
  Filled 2014-06-06: qty 3

## 2014-06-06 MED ORDER — PROPOFOL 10 MG/ML IV EMUL
5.0000 ug/kg/min | INTRAVENOUS | Status: DC
  Administered 2014-06-06: 30 ug/kg/min via INTRAVENOUS
  Administered 2014-06-07: 60 ug/kg/min via INTRAVENOUS
  Administered 2014-06-07: 50 ug/kg/min via INTRAVENOUS
  Administered 2014-06-08: 40 ug/kg/min via INTRAVENOUS
  Administered 2014-06-08: 45 ug/kg/min via INTRAVENOUS
  Administered 2014-06-08: 60 ug/kg/min via INTRAVENOUS
  Administered 2014-06-08: 30 ug/kg/min via INTRAVENOUS
  Administered 2014-06-09 (×4): 45 ug/kg/min via INTRAVENOUS
  Administered 2014-06-10: 55 ug/kg/min via INTRAVENOUS
  Administered 2014-06-10: 40 ug/kg/min via INTRAVENOUS
  Administered 2014-06-10 (×2): 50 ug/kg/min via INTRAVENOUS
  Administered 2014-06-11: 35 ug/kg/min via INTRAVENOUS
  Administered 2014-06-11: 20 ug/kg/min via INTRAVENOUS
  Administered 2014-06-11: 50 ug/kg/min via INTRAVENOUS
  Administered 2014-06-12 (×2): 45 ug/kg/min via INTRAVENOUS
  Administered 2014-06-12: 30 ug/kg/min via INTRAVENOUS
  Administered 2014-06-12: 40 ug/kg/min via INTRAVENOUS
  Administered 2014-06-13: 45 ug/kg/min via INTRAVENOUS
  Administered 2014-06-13: 25 ug/kg/min via INTRAVENOUS
  Administered 2014-06-13: 35 ug/kg/min via INTRAVENOUS
  Filled 2014-06-06 (×26): qty 100

## 2014-06-06 MED ORDER — FENTANYL CITRATE 0.05 MG/ML IJ SOLN: 100.0000 ug | Freq: Once | INTRAMUSCULAR | Status: AC | PRN

## 2014-06-06 MED ORDER — SODIUM CHLORIDE 0.9 % IV SOLN
25.0000 ug/h | INTRAVENOUS | Status: DC
  Administered 2014-06-06: 300 ug/h via INTRAVENOUS
  Administered 2014-06-07: 200 ug/h via INTRAVENOUS
  Administered 2014-06-08: 175 ug/h via INTRAVENOUS
  Administered 2014-06-09: 150 ug/h via INTRAVENOUS
  Administered 2014-06-10 – 2014-06-11 (×2): 250 ug/h via INTRAVENOUS
  Administered 2014-06-11: 200 ug/h via INTRAVENOUS
  Administered 2014-06-12: 275 ug/h via INTRAVENOUS
  Administered 2014-06-12: 250 ug/h via INTRAVENOUS
  Administered 2014-06-13: 175 ug/h via INTRAVENOUS
  Administered 2014-06-13: 100 ug/h via INTRAVENOUS
  Administered 2014-06-14: 200 ug/h via INTRAVENOUS
  Administered 2014-06-15: 50 ug/h via INTRAVENOUS
  Filled 2014-06-06 (×18): qty 50

## 2014-06-06 MED ORDER — SODIUM CHLORIDE 0.9 % IV SOLN
3.0000 ug/kg/min | INTRAVENOUS | Status: DC
  Administered 2014-06-06: 3 ug/kg/min via INTRAVENOUS
  Filled 2014-06-06 (×2): qty 20

## 2014-06-06 MED ORDER — SODIUM CHLORIDE 0.9 % IV SOLN
100.0000 mg | Freq: Every day | INTRAVENOUS | Status: DC
  Administered 2014-06-06 – 2014-06-13 (×8): 100 mg via INTRAVENOUS
  Filled 2014-06-06 (×8): qty 100

## 2014-06-06 MED ORDER — ARTIFICIAL TEARS OP OINT
1.0000 "application " | TOPICAL_OINTMENT | Freq: Three times a day (TID) | OPHTHALMIC | Status: DC
  Administered 2014-06-06 – 2014-06-08 (×5): 1 via OPHTHALMIC
  Filled 2014-06-06: qty 3.5

## 2014-06-06 MED ORDER — FENTANYL BOLUS VIA INFUSION
50.0000 ug | INTRAVENOUS | Status: DC | PRN
  Administered 2014-06-07 – 2014-06-13 (×8): 50 ug via INTRAVENOUS
  Filled 2014-06-06: qty 50

## 2014-06-06 MED ORDER — SODIUM PHOSPHATE 3 MMOLE/ML IV SOLN
10.0000 mmol | Freq: Once | INTRAVENOUS | Status: AC
  Administered 2014-06-06: 10 mmol via INTRAVENOUS
  Filled 2014-06-06: qty 3.33

## 2014-06-06 NOTE — Progress Notes (Signed)
ANTIBIOTIC CONSULT NOTE - FOLLOW UP  Pharmacy Consult for Vancomycin, Micafungin Indication: pneumonia, candidiasis  Allergies  Allergen Reactions  . Remicade [Infliximab]     convulsions    Patient Measurements: Height: 5' (152.4 cm) Weight: 122 lb 9.2 oz (55.6 kg) IBW/kg (Calculated) : 45.5  Vital Signs: Temp: 98.4 F (36.9 C) (04/01 0800) Temp Source: Axillary (04/01 0800) BP: 97/51 mmHg (04/01 0800) Pulse Rate: 54 (04/01 0405) Intake/Output from previous day: 03/31 0701 - 04/01 0700 In: 4023.4 [I.V.:3773.4; IV Piggyback:250] Out: 2255 [Urine:1790; Emesis/NG output:225; Drains:45; Stool:195] Intake/Output from this shift: Total I/O In: 355.4 [I.V.:345.4; Other:10] Out: -   Labs:  Recent Labs  06/04/14 1030 06/05/14 0500 06/06/14 0425  WBC 15.2* 22.2* 15.4*  HGB 10.8* 10.1* 8.9*  PLT 310 363 307  CREATININE 0.93 0.83 0.86   Estimated Creatinine Clearance: 55 mL/min (by C-G formula based on Cr of 0.86). No results for input(s): VANCOTROUGH, VANCOPEAK, VANCORANDOM, GENTTROUGH, GENTPEAK, GENTRANDOM, TOBRATROUGH, TOBRAPEAK, TOBRARND, AMIKACINPEAK, AMIKACINTROU, AMIKACIN in the last 72 hours.   Microbiology: Recent Results (from the past 720 hour(s))  MRSA PCR Screening     Status: None   Collection Time: 06/02/14 11:50 PM  Result Value Ref Range Status   MRSA by PCR NEGATIVE NEGATIVE Final    Comment:        The GeneXpert MRSA Assay (FDA approved for NASAL specimens only), is one component of a comprehensive MRSA colonization surveillance program. It is not intended to diagnose MRSA infection nor to guide or monitor treatment for MRSA infections.   Culture, respiratory (NON-Expectorated)     Status: None   Collection Time: 06/04/14 10:11 AM  Result Value Ref Range Status   Specimen Description TRACHEAL ASPIRATE  Final   Special Requests Immunocompromised  Final   Gram Stain   Final    RARE WBC PRESENT, PREDOMINANTLY MONONUCLEAR RARE SQUAMOUS  EPITHELIAL CELLS PRESENT NO ORGANISMS SEEN Performed at [facility]    Culture   Final    FEW CANDIDA ALBICANS Performed at [facility]    Report Status 06/06/2014 FINAL  Final  Culture, blood (routine x 2)     Status: None (Preliminary result)   Collection Time: 06/04/14 10:30 AM  Result Value Ref Range Status   Specimen Description BLOOD LEFT HAND  Final   Special Requests BOTTLES DRAWN AEROBIC ONLY 2CC  Final   Culture   Final           BLOOD CULTURE RECEIVED NO GROWTH TO DATE CULTURE WILL BE HELD FOR 5 DAYS BEFORE ISSUING A FINAL NEGATIVE REPORT Performed at [facility]    Report Status PENDING  Incomplete  Culture, blood (routine x 2)     Status: None (Preliminary result)   Collection Time: 06/04/14 10:40 AM  Result Value Ref Range Status   Specimen Description BLOOD RIGHT ARM  Final   Special Requests BOTTLES DRAWN AEROBIC ONLY [place]  Final   Culture   Final           BLOOD CULTURE RECEIVED NO GROWTH TO DATE CULTURE WILL BE HELD FOR 5 DAYS BEFORE ISSUING A FINAL NEGATIVE REPORT Performed at [facility]    Report Status PENDING  Incomplete     Assessment: 37 yoF admitted with colonic bowel perforation. S/p ex lap with partial colectomy 3/28. In ICU with postop respiratory failure on 3/30, requiring emergent intubation. CXR showed bilateral infiltrates suggestive of acute pulmonary edema. WBC elevated and patient is febrile. Patient has been  on Zosyn per [facility]. PCCM added vancomycin per pharmacy for possible pneumonia. Note patient is on Humira and prednisone PTA.   3/28 >> Zosyn >> 3/30 >> Vancomycin >> 4/1 >> micafungin >>  Micro: 3/28 MRSA PCR: negative 3/30 Resp cx: few candida albicans 3/30 blood x2: ngtd 3/30 urine: IP 3/30 Respiratory virus panel: IP  Today, 06/06/2014:  Tmax: improved to afebrile WBC: elevated but improved, 15.4 (note on solucortef) Renal: SCr 0.86, CrCl~55 ml/min PCT 12.31 > 9.78 > 5.49  4/1  Respiratory culture with C. Albicans.  The 2016 IDSA guidelines recommend that empiric antifungal therapy be considered in critically ill patients who are at risk for invasive candidiasis because of the high mortality risk.  An echinocandin is recommended as initial therapy.  Goal of Therapy:  Vancomycin trough level 15-20 mcg/ml  Plan:   Micafungin 100mg  IV daily  Continue Zosyn 3.375g IV Q8H infused over 4hrs.  Continue Vancomycin 500mg  IV q12h.  Measure Vanc trough at steady state.  Follow up renal fxn, culture results, and clinical course.    Leeroy Bock 06/06/2014,9:39 AM

## 2014-06-06 NOTE — Progress Notes (Addendum)
Pt not synchronous with vent.  On high doses of sedation.  On ARDS protocol.  Will add nimbex.  Chesley Mires, MD [facility] Pulmonary/Critical Care 06/06/2014, 4:21 PM Pager:  [phone] After 3pm call: [phone]

## 2014-06-06 NOTE — Progress Notes (Addendum)
Rt called MD regarding pt increased WOB, RR at 49, pt breath stacking. MD request RN to turn pt sedation back on. RN followed orders at this time.

## 2014-06-06 NOTE — Progress Notes (Signed)
    Subjective:  Intubated and sedated   Objective:  Filed Vitals:   06/06/14 0405 06/06/14 0500 06/06/14 0600 06/06/14 0700  BP: 144/62     Pulse: 54     Temp:      TempSrc:      Resp: 35 35 35 35  Height:      Weight:      SpO2: 99% 98% 98% 98%    Intake/Output from previous day:  Intake/Output Summary (Last 24 hours) at 06/06/14 0755 Last data filed at 06/06/14 0700  Gross per 24 hour  Intake 3973.38 ml  Output   2255 ml  Net 1718.38 ml    Physical Exam: Physical exam: Well-developed well-nourished, intubated Skin is warm and dry.  HEENT is normal.  Neck is supple. Chest mild diffuse rhonchi Cardiovascular exam is regular rate and rhythm.  Abdominal exam not distended; colostomy Extremities show no edema. neuro intubated and sedated    Lab Results: Basic Metabolic Panel:  Recent Labs  06/05/14 0500 06/06/14 0425  NA 133* 137  K 4.1 5.0  CL 105 115*  CO2 24 19  GLUCOSE 143* 153*  BUN 10 10  CREATININE 0.83 0.86  CALCIUM 6.8* 7.1*  MG 2.4 2.4  PHOS 2.3 2.0*   CBC:  Recent Labs  06/05/14 0500 06/06/14 0425  WBC 22.2* 15.4*  NEUTROABS 20.7* 13.8*  HGB 10.1* 8.9*  HCT 32.2* 28.4*  MCV 96.4 96.3  PLT 363 307   Cardiac Enzymes:  Recent Labs  06/04/14 0909  06/04/14 1530 06/04/14 2010 06/05/14 0500  CKTOTAL 1246*  --   --   --   --   CKMB 18.7*  --   --   --   --   TROPONINI 0.12*  < > 0.31* 0.22* 0.14*  < > = values in this interval not displayed.   Assessment/Plan:  1. Acute hypoxic respiratory failure-BNP only mildly elevated; LV function normal on echo; Troponin minimally elevated with no clear trend; above suggests this is less likely ischemia mediated; diurese as tolerated by BP; probable ALI; ?infection; CCM managing; on antibiotics. 2. Mildly elevated troponin in the setting of recent acute bowel perforation and respiratory failure. No clear trend; per Dr Radford Pax, patient denied CP (remains sedated this AM); continue ASA.  Will need ischemia eval (nuclear study) once she improves. 3. Bowel perforation s/p emergent laparotomy 4. Remote CAD with history of PCI in [place] 06/06/2014, 7:55 AM

## 2014-06-06 NOTE — Progress Notes (Signed)
Patient ID: [name], female   DOB: Aug 30, 1955, 59 y.o.   MRN: [id number]     [employer]      [address] [place], [place] [address]    Phone: [phone] FAX: [phone]     Subjective: Now in a trial for ARDS.  On neo, full vent support.  Sedated.  Having ostomy output.  Good uop.  Wbc down.    Objective:  Vital signs:  Filed Vitals:   06/06/14 0405 06/06/14 0500 06/06/14 0600 06/06/14 0700  BP: 144/62     Pulse: 54     Temp:      TempSrc:      Resp: 35 35 35 35  Height:      Weight:      SpO2: 99% 98% 98% 98%    Last BM Date: 06/04/14 (ostomy)  Intake/Output   Yesterday:  03/31 0701 - 04/01 0700 In: 3973.4 [I.V.:3773.4; IV SPQZRAQTM:226] Out: 2255 [JFHLK:5625; Emesis/NG output:225; Drains:45; Stool:195] This shift:    I/O last 3 completed shifts: In: 7054.2 [I.V.:6804.2; IV Piggyback:250] Out: 2915 [Urine:2250; Emesis/NG output:375; Drains:45; Stool:245]    Physical Exam: General: Pt sedated on vent. Chest: vent, coarse.  CV: Pulses intact. Regular rhythm MS: Normal AROM mjr joints. No obvious deformity Abdomen: Soft. Nondistended. Midline wound VAC. Ostomy functioning. No evidence of peritonitis. No incarcerated hernias. Ext: SCDs BLE. No mjr edema. No cyanosis Skin: No petechiae / purpura    Problem List:   Active Problems:   Perforation of colon   Crohn's colitis   Acute respiratory failure with hypoxia   Acute pulmonary edema   Acute respiratory failure with hypoxemia   Elevated troponin   ARDS (adult respiratory distress syndrome)   Septic shock    Results:   Labs: Results for orders placed or performed during the hospital encounter of 06/02/14 (from the past 48 hour(s))  Troponin I     Status: Abnormal   Collection Time: 06/04/14  9:09 AM  Result Value Ref Range   Troponin I 0.12 (H) <0.031 ng/mL    Comment:        PERSISTENTLY INCREASED TROPONIN VALUES IN THE  RANGE OF 0.04-0.49 ng/mL CAN BE SEEN IN:       -UNSTABLE ANGINA       -CONGESTIVE HEART FAILURE       -MYOCARDITIS       -CHEST TRAUMA       -ARRYHTHMIAS       -LATE PRESENTING MYOCARDIAL INFARCTION       -COPD   CLINICAL FOLLOW-UP RECOMMENDED.    CK total and CKMB (cardiac)     Status: Abnormal   Collection Time: 06/04/14  9:09 AM  Result Value Ref Range   Total CK 1246 (H) 7 - 177 U/L   CK, MB 18.7 (HH) 0.3 - 4.0 ng/mL    Comment: REPEATED TO VERIFY CRITICAL RESULT CALLED TO, READ BACK BY AND VERIFIED WITH: D.SHEPHERD,RN 06/04/14 1341 BY BSLADE    Relative Index 1.5 0.0 - 2.5    Comment: Performed at [facility]  Brain natriuretic peptide     Status: Abnormal   Collection Time: 06/04/14  9:09 AM  Result Value Ref Range   B Natriuretic Peptide 409.2 (H) 0.0 - 100.0 pg/mL  Culture, respiratory (NON-Expectorated)     Status: None (Preliminary result)   Collection Time: 06/04/14 10:11 AM  Result Value Ref Range   Specimen Description TRACHEAL ASPIRATE    Special  Requests Immunocompromised    Gram Stain      RARE WBC PRESENT, PREDOMINANTLY MONONUCLEAR RARE SQUAMOUS EPITHELIAL CELLS PRESENT NO ORGANISMS SEEN Performed at [facility]    Culture      NO GROWTH 1 DAY Performed at [facility]    Report Status PENDING   Troponin I     Status: Abnormal   Collection Time: 06/04/14 10:30 AM  Result Value Ref Range   Troponin I 0.18 (H) <0.031 ng/mL    Comment:        PERSISTENTLY INCREASED TROPONIN VALUES IN THE RANGE OF 0.04-0.49 ng/mL CAN BE SEEN IN:       -UNSTABLE ANGINA       -CONGESTIVE HEART FAILURE       -MYOCARDITIS       -CHEST TRAUMA       -ARRYHTHMIAS       -LATE PRESENTING MYOCARDIAL INFARCTION       -COPD   CLINICAL FOLLOW-UP RECOMMENDED.   Procalcitonin - Baseline     Status: None   Collection Time: 06/04/14 10:30 AM  Result Value Ref Range   Procalcitonin 12.31 ng/mL    Comment:        Interpretation: PCT >= 10  ng/mL: Important systemic inflammatory response, almost exclusively due to severe bacterial sepsis or septic shock. (NOTE)         ICU PCT Algorithm               Non ICU PCT Algorithm    ----------------------------     ------------------------------         PCT < 0.25 ng/mL                 PCT < 0.1 ng/mL     Stopping of antibiotics            Stopping of antibiotics       strongly encouraged.               strongly encouraged.    ----------------------------     ------------------------------       PCT level decrease by               PCT < 0.25 ng/mL       >= 80% from peak PCT       OR PCT 0.25 - 0.5 ng/mL          Stopping of antibiotics                                             encouraged.     Stopping of antibiotics           encouraged.    ----------------------------     ------------------------------       PCT level decrease by              PCT >= 0.25 ng/mL       < 80% from peak PCT        AND PCT >= 0.5 ng/mL             Continuing antibiotics                                              encouraged.  Continuing antibiotics            encouraged.    ----------------------------     ------------------------------     PCT level increase compared          PCT > 0.5 ng/mL         with peak PCT AND          PCT >= 0.5 ng/mL             Escalation of antibiotics                                          strongly encouraged.      Escalation of antibiotics        strongly encouraged.   Phosphorus     Status: Abnormal   Collection Time: 06/04/14 10:30 AM  Result Value Ref Range   Phosphorus 2.0 (L) 2.3 - 4.6 mg/dL  Magnesium     Status: Abnormal   Collection Time: 06/04/14 10:30 AM  Result Value Ref Range   Magnesium 1.3 (L) 1.5 - 2.5 mg/dL  CBC with Differential/Platelet     Status: Abnormal   Collection Time: 06/04/14 10:30 AM  Result Value Ref Range   WBC 15.2 (H) 4.0 - 10.5 K/uL   RBC 3.60 (L) 3.87 - 5.11 MIL/uL   Hemoglobin 10.8 (L) 12.0 - 15.0 g/dL   HCT  34.6 (L) 36.0 - 46.0 %   MCV 96.1 78.0 - 100.0 fL   MCH 30.0 26.0 - 34.0 pg   MCHC 31.2 30.0 - 36.0 g/dL   RDW 13.2 11.5 - 15.5 %   Platelets 310 150 - 400 K/uL   Neutrophils Relative % 92 (H) 43 - 77 %   Neutro Abs 14.0 (H) 1.7 - 7.7 K/uL   Lymphocytes Relative 5 (L) 12 - 46 %   Lymphs Abs 0.7 0.7 - 4.0 K/uL   Monocytes Relative 3 3 - 12 %   Monocytes Absolute 0.5 0.1 - 1.0 K/uL   Eosinophils Relative 0 0 - 5 %   Eosinophils Absolute 0.0 0.0 - 0.7 K/uL   Basophils Relative 0 0 - 1 %   Basophils Absolute 0.0 0.0 - 0.1 K/uL  Basic metabolic panel     Status: Abnormal   Collection Time: 06/04/14 10:30 AM  Result Value Ref Range   Sodium 137 135 - 145 mmol/L   Potassium 3.6 3.5 - 5.1 mmol/L   Chloride 107 96 - 112 mmol/L   CO2 24 19 - 32 mmol/L   Glucose, Bld 103 (H) 70 - 99 mg/dL   BUN 9 6 - 23 mg/dL   Creatinine, Ser 0.93 0.50 - 1.10 mg/dL   Calcium 7.2 (L) 8.4 - 10.5 mg/dL   GFR calc non Af Amer 66 (L) >90 mL/min   GFR calc Af Amer 76 (L) >90 mL/min    Comment: (NOTE) The eGFR has been calculated using the CKD EPI equation. This calculation has not been validated in all clinical situations. eGFR's persistently <90 mL/min signify possible Chronic Kidney Disease.    Anion gap 6 5 - 15  Hepatic function panel     Status: Abnormal   Collection Time: 06/04/14 10:30 AM  Result Value Ref Range   Total Protein 5.3 (L) 6.0 - 8.3 g/dL   Albumin 2.3 (L) 3.5 - 5.2 g/dL   AST 62 (H) 0 - 37 U/L  ALT 18 0 - 35 U/L   Alkaline Phosphatase 50 39 - 117 U/L   Total Bilirubin 0.7 0.3 - 1.2 mg/dL   Bilirubin, Direct 0.2 0.0 - 0.5 mg/dL   Indirect Bilirubin 0.5 0.3 - 0.9 mg/dL  Protime-INR     Status: None   Collection Time: 06/04/14 10:30 AM  Result Value Ref Range   Prothrombin Time 15.0 11.6 - 15.2 seconds   INR 1.17 0.00 - 1.49  Culture, blood (routine x 2)     Status: None (Preliminary result)   Collection Time: 06/04/14 10:30 AM  Result Value Ref Range   Specimen Description  BLOOD LEFT HAND    Special Requests BOTTLES DRAWN AEROBIC ONLY 2CC    Culture             BLOOD CULTURE RECEIVED NO GROWTH TO DATE CULTURE WILL BE HELD FOR 5 DAYS BEFORE ISSUING A FINAL NEGATIVE REPORT Performed at [facility]    Report Status PENDING   APTT     Status: Abnormal   Collection Time: 06/04/14 10:30 AM  Result Value Ref Range   aPTT 42 (H) 24 - 37 seconds    Comment:        IF BASELINE aPTT IS ELEVATED, SUGGEST PATIENT RISK ASSESSMENT BE USED TO DETERMINE APPROPRIATE ANTICOAGULANT THERAPY.   Lactic acid, plasma     Status: None   Collection Time: 06/04/14 10:40 AM  Result Value Ref Range   Lactic Acid, Venous 1.2 0.5 - 2.0 mmol/L  Brain natriuretic peptide     Status: Abnormal   Collection Time: 06/04/14 10:40 AM  Result Value Ref Range   B Natriuretic Peptide 230.4 (H) 0.0 - 100.0 pg/mL  Culture, blood (routine x 2)     Status: None (Preliminary result)   Collection Time: 06/04/14 10:40 AM  Result Value Ref Range   Specimen Description BLOOD RIGHT ARM    Special Requests BOTTLES DRAWN AEROBIC ONLY [place]    Culture             BLOOD CULTURE RECEIVED NO GROWTH TO DATE CULTURE WILL BE HELD FOR 5 DAYS BEFORE ISSUING A FINAL NEGATIVE REPORT Performed at [facility]    Report Status PENDING   Blood gas, arterial     Status: Abnormal   Collection Time: 06/04/14 10:49 AM  Result Value Ref Range   FIO2 1.00 %   Delivery systems VENTILATOR    Mode PRESSURE REGULATED VOLUME CONTROL    VT 360 mL   Rate 14 resp/min   Peep/cpap 5.0 cm H20   pH, Arterial 7.302 (L) 7.350 - 7.450   pCO2 arterial 49.3 (H) 35.0 - 45.0 mmHg   pO2, Arterial 69.5 (L) 80.0 - 100.0 mmHg   Bicarbonate 23.3 20.0 - 24.0 mEq/L   TCO2 22.0 0 - 100 mmol/L   Acid-base deficit 2.4 (H) 0.0 - 2.0 mmol/L   O2 Saturation 90.3 %   Patient temperature 100.3    Collection site RIGHT BRACHIAL    Drawn by [phone]    Sample type ARTERIAL   Glucose, capillary     Status: Abnormal    Collection Time: 06/04/14 11:22 AM  Result Value Ref Range   Glucose-Capillary 119 (H) 70 - 99 mg/dL   Comment 1 Notify RN    Comment 2 Document in Chart   Troponin I     Status: Abnormal   Collection Time: 06/04/14  3:30 PM  Result Value Ref Range   Troponin I 0.31 (H) <  0.031 ng/mL    Comment:        PERSISTENTLY INCREASED TROPONIN VALUES IN THE RANGE OF 0.04-0.49 ng/mL CAN BE SEEN IN:       -UNSTABLE ANGINA       -CONGESTIVE HEART FAILURE       -MYOCARDITIS       -CHEST TRAUMA       -ARRYHTHMIAS       -LATE PRESENTING MYOCARDIAL INFARCTION       -COPD   CLINICAL FOLLOW-UP RECOMMENDED.   Glucose, capillary     Status: Abnormal   Collection Time: 06/04/14  4:06 PM  Result Value Ref Range   Glucose-Capillary 235 (H) 70 - 99 mg/dL   Comment 1 Notify RN    Comment 2 Document in Chart   Troponin I     Status: Abnormal   Collection Time: 06/04/14  8:10 PM  Result Value Ref Range   Troponin I 0.22 (H) <0.031 ng/mL    Comment:        PERSISTENTLY INCREASED TROPONIN VALUES IN THE RANGE OF 0.04-0.49 ng/mL CAN BE SEEN IN:       -UNSTABLE ANGINA       -CONGESTIVE HEART FAILURE       -MYOCARDITIS       -CHEST TRAUMA       -ARRYHTHMIAS       -LATE PRESENTING MYOCARDIAL INFARCTION       -COPD   CLINICAL FOLLOW-UP RECOMMENDED.   Heparin level (unfractionated)     Status: Abnormal   Collection Time: 06/04/14  8:10 PM  Result Value Ref Range   Heparin Unfractionated 0.10 (L) 0.30 - 0.70 IU/mL    Comment:        IF HEPARIN RESULTS ARE BELOW EXPECTED VALUES, AND PATIENT DOSAGE HAS BEEN CONFIRMED, SUGGEST FOLLOW UP TESTING OF ANTITHROMBIN III LEVELS.   Triglycerides     Status: None   Collection Time: 06/04/14  8:10 PM  Result Value Ref Range   Triglycerides 104 <150 mg/dL    Comment: Performed at [facility]  Glucose, capillary     Status: Abnormal   Collection Time: 06/04/14  8:40 PM  Result Value Ref Range   Glucose-Capillary 163 (H) 70 - 99 mg/dL  Glucose,  capillary     Status: Abnormal   Collection Time: 06/04/14 11:41 PM  Result Value Ref Range   Glucose-Capillary 159 (H) 70 - 99 mg/dL   Comment 1 Notify RN    Comment 2 Document in Chart   Glucose, capillary     Status: Abnormal   Collection Time: 06/05/14  3:35 AM  Result Value Ref Range   Glucose-Capillary 144 (H) 70 - 99 mg/dL  Troponin I     Status: Abnormal   Collection Time: 06/05/14  5:00 AM  Result Value Ref Range   Troponin I 0.14 (H) <0.031 ng/mL    Comment:        PERSISTENTLY INCREASED TROPONIN VALUES IN THE RANGE OF 0.04-0.49 ng/mL CAN BE SEEN IN:       -UNSTABLE ANGINA       -CONGESTIVE HEART FAILURE       -MYOCARDITIS       -CHEST TRAUMA       -ARRYHTHMIAS       -LATE PRESENTING MYOCARDIAL INFARCTION       -COPD   CLINICAL FOLLOW-UP RECOMMENDED.   Procalcitonin     Status: None   Collection Time: 06/05/14  5:00 AM  Result Value Ref Range  Procalcitonin 9.78 ng/mL    Comment:        Interpretation: PCT > 2 ng/mL: Systemic infection (sepsis) is likely, unless other causes are known. (NOTE)         ICU PCT Algorithm               Non ICU PCT Algorithm    ----------------------------     ------------------------------         PCT < 0.25 ng/mL                 PCT < 0.1 ng/mL     Stopping of antibiotics            Stopping of antibiotics       strongly encouraged.               strongly encouraged.    ----------------------------     ------------------------------       PCT level decrease by               PCT < 0.25 ng/mL       >= 80% from peak PCT       OR PCT 0.25 - 0.5 ng/mL          Stopping of antibiotics                                             encouraged.     Stopping of antibiotics           encouraged.    ----------------------------     ------------------------------       PCT level decrease by              PCT >= 0.25 ng/mL       < 80% from peak PCT        AND PCT >= 0.5 ng/mL            Continuing antibiotics                                                encouraged.       Continuing antibiotics            encouraged.    ----------------------------     ------------------------------     PCT level increase compared          PCT > 0.5 ng/mL         with peak PCT AND          PCT >= 0.5 ng/mL             Escalation of antibiotics                                          strongly encouraged.      Escalation of antibiotics        strongly encouraged.   CBC with Differential/Platelet     Status: Abnormal   Collection Time: 06/05/14  5:00 AM  Result Value Ref Range   WBC 22.2 (H) 4.0 - 10.5 K/uL   RBC 3.34 (L) 3.87 - 5.11 MIL/uL   Hemoglobin 10.1 (L) 12.0 - 15.0  g/dL   HCT 32.2 (L) 36.0 - 46.0 %   MCV 96.4 78.0 - 100.0 fL   MCH 30.2 26.0 - 34.0 pg   MCHC 31.4 30.0 - 36.0 g/dL   RDW 13.4 11.5 - 15.5 %   Platelets 363 150 - 400 K/uL   Neutrophils Relative % 94 (H) 43 - 77 %   Neutro Abs 20.7 (H) 1.7 - 7.7 K/uL   Lymphocytes Relative 3 (L) 12 - 46 %   Lymphs Abs 0.7 0.7 - 4.0 K/uL   Monocytes Relative 3 3 - 12 %   Monocytes Absolute 0.8 0.1 - 1.0 K/uL   Eosinophils Relative 0 0 - 5 %   Eosinophils Absolute 0.0 0.0 - 0.7 K/uL   Basophils Relative 0 0 - 1 %   Basophils Absolute 0.0 0.0 - 0.1 K/uL  Basic metabolic panel     Status: Abnormal   Collection Time: 06/05/14  5:00 AM  Result Value Ref Range   Sodium 133 (L) 135 - 145 mmol/L   Potassium 4.1 3.5 - 5.1 mmol/L   Chloride 105 96 - 112 mmol/L   CO2 24 19 - 32 mmol/L   Glucose, Bld 143 (H) 70 - 99 mg/dL   BUN 10 6 - 23 mg/dL   Creatinine, Ser 0.83 0.50 - 1.10 mg/dL   Calcium 6.8 (L) 8.4 - 10.5 mg/dL   GFR calc non Af Amer 76 (L) >90 mL/min   GFR calc Af Amer 88 (L) >90 mL/min    Comment: (NOTE) The eGFR has been calculated using the CKD EPI equation. This calculation has not been validated in all clinical situations. eGFR's persistently <90 mL/min signify possible Chronic Kidney Disease.    Anion gap 4 (L) 5 - 15  Magnesium     Status: None   Collection  Time: 06/05/14  5:00 AM  Result Value Ref Range   Magnesium 2.4 1.5 - 2.5 mg/dL  Phosphorus     Status: None   Collection Time: 06/05/14  5:00 AM  Result Value Ref Range   Phosphorus 2.3 2.3 - 4.6 mg/dL  Heparin level (unfractionated)     Status: Abnormal   Collection Time: 06/05/14  5:00 AM  Result Value Ref Range   Heparin Unfractionated 0.25 (L) 0.30 - 0.70 IU/mL    Comment:        IF HEPARIN RESULTS ARE BELOW EXPECTED VALUES, AND PATIENT DOSAGE HAS BEEN CONFIRMED, SUGGEST FOLLOW UP TESTING OF ANTITHROMBIN III LEVELS.   Glucose, capillary     Status: Abnormal   Collection Time: 06/05/14  8:14 AM  Result Value Ref Range   Glucose-Capillary 153 (H) 70 - 99 mg/dL  Blood gas, arterial     Status: Abnormal   Collection Time: 06/05/14 10:30 AM  Result Value Ref Range   FIO2 0.60 %   Delivery systems VENTILATOR    Mode PRESSURE REGULATED VOLUME CONTROL    VT 360 mL   Rate 14 resp/min   Peep/cpap 8.0 cm H20   pH, Arterial 7.143 (LL) 7.350 - 7.450    Comment: CRITICAL RESULT CALLED TO, READ BACK BY AND VERIFIED WITH: S.MINOR,NP AT 1038 BY LISA CRADDOCK,RRT,RCP ON 06/05/14    pCO2 arterial 62.9 (HH) 35.0 - 45.0 mmHg    Comment: CRITICAL RESULT CALLED TO, READ BACK BY AND VERIFIED WITH: S.MINOR,NP AT 1038 BY LISA CRADDOCK ON 06/05/14    pO2, Arterial 74.3 (L) 80.0 - 100.0 mmHg   Bicarbonate 20.7 20.0 - 24.0 mEq/L   TCO2  20.4 0 - 100 mmol/L   Acid-base deficit 8.3 (H) 0.0 - 2.0 mmol/L   O2 Saturation 90.9 %   Patient temperature 98.6    Collection site BRACHIAL ARTERY    Drawn by [id number]    Sample type ARTERIAL DRAW    Allens test (pass/fail) PASS PASS  Blood gas, arterial     Status: Abnormal   Collection Time: 06/05/14 12:00 PM  Result Value Ref Range   FIO2 0.60 %   Delivery systems VENTILATOR    Mode PRESSURE REGULATED VOLUME CONTROL    VT 360 mL   Rate 28 resp/min   Peep/cpap 8.0 cm H20   pH, Arterial 7.282 (L) 7.350 - 7.450   pCO2 arterial 42.7 35.0 - 45.0 mmHg    pO2, Arterial 68.4 (L) 80.0 - 100.0 mmHg   Bicarbonate 19.5 (L) 20.0 - 24.0 mEq/L   TCO2 18.7 0 - 100 mmol/L   Acid-base deficit 6.3 (H) 0.0 - 2.0 mmol/L   O2 Saturation 92.1 %   Patient temperature 98.6    Collection site BRACHIAL ARTERY    Drawn by [id number]    Sample type ARTERIAL DRAW   Glucose, capillary     Status: Abnormal   Collection Time: 06/05/14 12:40 PM  Result Value Ref Range   Glucose-Capillary 113 (H) 70 - 99 mg/dL  Blood gas, arterial     Status: Abnormal   Collection Time: 06/05/14  1:08 PM  Result Value Ref Range   FIO2 0.60 %   Delivery systems VENTILATOR    Mode PRESSURE REGULATED VOLUME CONTROL    VT 320 mL   Rate 32 resp/min   Peep/cpap 8.0 cm H20   pH, Arterial 7.248 (L) 7.350 - 7.450   pCO2 arterial 47.1 (H) 35.0 - 45.0 mmHg   pO2, Arterial 88.0 80.0 - 100.0 mmHg   Bicarbonate 19.8 (L) 20.0 - 24.0 mEq/L   TCO2 19.2 0 - 100 mmol/L   Acid-base deficit 6.6 (H) 0.0 - 2.0 mmol/L   O2 Saturation 95.4 %   Patient temperature 98.6    Collection site A-LINE    Drawn by [id number]    Sample type ARTERIAL DRAW    Allens test (pass/fail) PASS PASS  Lactic acid, plasma     Status: None   Collection Time: 06/05/14  1:58 PM  Result Value Ref Range   Lactic Acid, Venous 1.2 0.5 - 2.0 mmol/L  Glucose, capillary     Status: None   Collection Time: 06/05/14  4:38 PM  Result Value Ref Range   Glucose-Capillary 92 70 - 99 mg/dL  Blood gas, arterial     Status: Abnormal   Collection Time: 06/05/14  5:45 PM  Result Value Ref Range   FIO2 0.60 %   Delivery systems VENTILATOR    Mode PRESSURE REGULATED VOLUME CONTROL    VT 320 mL   Rate 35 resp/min   Peep/cpap 8.0 cm H20   pH, Arterial 7.289 (L) 7.350 - 7.450   pCO2 arterial 42.2 35.0 - 45.0 mmHg   pO2, Arterial 87.7 80.0 - 100.0 mmHg   Bicarbonate 19.6 (L) 20.0 - 24.0 mEq/L   TCO2 18.8 0 - 100 mmol/L   Acid-base deficit 6.1 (H) 0.0 - 2.0 mmol/L   O2 Saturation 95.5 %   Patient temperature 98.6    Collection  site A-LINE    Drawn by [id number]    Sample type ARTERIAL DRAW    Allens test (pass/fail) PASS PASS  Pregnancy, urine  Status: None   Collection Time: 06/05/14  5:50 PM  Result Value Ref Range   Preg Test, Ur NEGATIVE NEGATIVE  Blood gas, arterial     Status: Abnormal   Collection Time: 06/05/14  7:20 PM  Result Value Ref Range   FIO2 0.50 %   Delivery systems VENTILATOR    Mode PRESSURE REGULATED VOLUME CONTROL    VT 280 mL   Rate 35 resp/min   Peep/cpap 16.0 cm H20   pH, Arterial 7.258 (L) 7.350 - 7.450   pCO2 arterial 44.7 35.0 - 45.0 mmHg   pO2, Arterial 141.0 (H) 80.0 - 100.0 mmHg   Bicarbonate 19.3 (L) 20.0 - 24.0 mEq/L   TCO2 18.6 0 - 100 mmol/L   Acid-base deficit 7.0 (H) 0.0 - 2.0 mmol/L   O2 Saturation 97.7 %   Patient temperature 98.6    Collection site A-LINE    Drawn by [id number]    Sample type ARTERIAL DRAW   Glucose, capillary     Status: Abnormal   Collection Time: 06/05/14  7:55 PM  Result Value Ref Range   Glucose-Capillary 128 (H) 70 - 99 mg/dL   Comment 1 Document in Chart    Comment 2 Repeat Test   Glucose, capillary     Status: Abnormal   Collection Time: 06/06/14 12:30 AM  Result Value Ref Range   Glucose-Capillary 121 (H) 70 - 99 mg/dL   Comment 1 Document in Chart    Comment 2 Repeat Test   Blood gas, arterial     Status: Abnormal   Collection Time: 06/06/14  4:05 AM  Result Value Ref Range   FIO2 0.40 %   Delivery systems VENTILATOR    Mode PRESSURE REGULATED VOLUME CONTROL    VT 280 mL   Rate 35 resp/min   Peep/cpap 14.0 cm H20   pH, Arterial 7.251 (L) 7.350 - 7.450   pCO2 arterial 42.0 35.0 - 45.0 mmHg   pO2, Arterial 145.0 (H) 80.0 - 100.0 mmHg   Bicarbonate 17.8 (L) 20.0 - 24.0 mEq/L   TCO2 17.3 0 - 100 mmol/L   Acid-base deficit 8.3 (H) 0.0 - 2.0 mmol/L   O2 Saturation 97.8 %   Patient temperature 98.6    Collection site A-LINE    Drawn by [id number]    Sample type ARTERIAL DRAW   Glucose, capillary     Status: Abnormal   Collection  Time: 06/06/14  4:17 AM  Result Value Ref Range   Glucose-Capillary 136 (H) 70 - 99 mg/dL   Comment 1 Document in Chart    Comment 2 Repeat Test   Procalcitonin     Status: None   Collection Time: 06/06/14  4:25 AM  Result Value Ref Range   Procalcitonin 5.49 ng/mL    Comment:        Interpretation: PCT > 2 ng/mL: Systemic infection (sepsis) is likely, unless other causes are known. (NOTE)         ICU PCT Algorithm               Non ICU PCT Algorithm    ----------------------------     ------------------------------         PCT < 0.25 ng/mL                 PCT < 0.1 ng/mL     Stopping of antibiotics            Stopping of antibiotics       strongly encouraged.  strongly encouraged.    ----------------------------     ------------------------------       PCT level decrease by               PCT < 0.25 ng/mL       >= 80% from peak PCT       OR PCT 0.25 - 0.5 ng/mL          Stopping of antibiotics                                             encouraged.     Stopping of antibiotics           encouraged.    ----------------------------     ------------------------------       PCT level decrease by              PCT >= 0.25 ng/mL       < 80% from peak PCT        AND PCT >= 0.5 ng/mL            Continuing antibiotics                                               encouraged.       Continuing antibiotics            encouraged.    ----------------------------     ------------------------------     PCT level increase compared          PCT > 0.5 ng/mL         with peak PCT AND          PCT >= 0.5 ng/mL             Escalation of antibiotics                                          strongly encouraged.      Escalation of antibiotics        strongly encouraged.   CBC with Differential/Platelet     Status: Abnormal   Collection Time: 06/06/14  4:25 AM  Result Value Ref Range   WBC 15.4 (H) 4.0 - 10.5 K/uL   RBC 2.95 (L) 3.87 - 5.11 MIL/uL   Hemoglobin 8.9 (L) 12.0 - 15.0  g/dL   HCT 28.4 (L) 36.0 - 46.0 %   MCV 96.3 78.0 - 100.0 fL   MCH 30.2 26.0 - 34.0 pg   MCHC 31.3 30.0 - 36.0 g/dL   RDW 13.9 11.5 - 15.5 %   Platelets 307 150 - 400 K/uL   Neutrophils Relative % 89 (H) 43 - 77 %   Neutro Abs 13.8 (H) 1.7 - 7.7 K/uL   Lymphocytes Relative 5 (L) 12 - 46 %   Lymphs Abs 0.7 0.7 - 4.0 K/uL   Monocytes Relative 6 3 - 12 %   Monocytes Absolute 0.9 0.1 - 1.0 K/uL   Eosinophils Relative 0 0 - 5 %   Eosinophils Absolute 0.0 0.0 - 0.7 K/uL   Basophils Relative 0 0 - 1 %   Basophils Absolute 0.0 0.0 - 0.1  K/uL  Basic metabolic panel     Status: Abnormal   Collection Time: 06/06/14  4:25 AM  Result Value Ref Range   Sodium 137 135 - 145 mmol/L   Potassium 5.0 3.5 - 5.1 mmol/L    Comment: DELTA CHECK NOTED REPEATED TO VERIFY NO VISIBLE HEMOLYSIS    Chloride 115 (H) 96 - 112 mmol/L    Comment: DELTA CHECK NOTED REPEATED TO VERIFY    CO2 19 19 - 32 mmol/L   Glucose, Bld 153 (H) 70 - 99 mg/dL   BUN 10 6 - 23 mg/dL   Creatinine, Ser 0.86 0.50 - 1.10 mg/dL   Calcium 7.1 (L) 8.4 - 10.5 mg/dL   GFR calc non Af Amer 73 (L) >90 mL/min   GFR calc Af Amer 84 (L) >90 mL/min    Comment: (NOTE) The eGFR has been calculated using the CKD EPI equation. This calculation has not been validated in all clinical situations. eGFR's persistently <90 mL/min signify possible Chronic Kidney Disease.    Anion gap 3 (L) 5 - 15  Magnesium     Status: None   Collection Time: 06/06/14  4:25 AM  Result Value Ref Range   Magnesium 2.4 1.5 - 2.5 mg/dL  Phosphorus     Status: Abnormal   Collection Time: 06/06/14  4:25 AM  Result Value Ref Range   Phosphorus 2.0 (L) 2.3 - 4.6 mg/dL  Protime-INR     Status: None   Collection Time: 06/06/14  4:25 AM  Result Value Ref Range   Prothrombin Time 13.3 11.6 - 15.2 seconds   INR 1.00 0.00 - 1.49  APTT     Status: None   Collection Time: 06/06/14  4:25 AM  Result Value Ref Range   aPTT 34 24 - 37 seconds    Imaging /  Studies: Dg Chest 1 View  06/05/2014   CLINICAL DATA:  Respiratory failure.  EXAM: CHEST  1 VIEW  COMPARISON:  06/04/2014, 06/02/2014.  FINDINGS: Endotracheal tube, left IJ line, NG tube in stable position. Heart size stable. Persistent dense bilateral pulmonary infiltrates remain. Infiltrates have worsened from prior exam. No pleural effusion or pneumothorax.  IMPRESSION: 1. Lines and tubes in stable position. 2. Persistent dense bilateral pulmonary infiltrates. Infiltrates have worsened from prior exam.   Electronically Signed   By: Byng   On: 06/05/2014 07:44   Dg Chest Port 1 View  06/06/2014   CLINICAL DATA:  Respiratory failure.  EXAM: PORTABLE CHEST - 1 VIEW  COMPARISON:  06/05/2014 and 06/04/2014 and 06/01/2008  FINDINGS: Endotracheal tube is in good position. Central venous catheter tip is in the superior vena cava 3.8 cm below the carina. Bilateral pulmonary infiltrates have improved on the left and in the right upper lobe but I have worsened at the right base. Heart size and vascularity are normal.  IMPRESSION: Persistent bilateral pulmonary infiltrates consistent with ARDS. Infiltrates of increased at the right base but improved in the remainder of the lungs.   Electronically Signed   By: Lorriane Shire M.D.   On: 06/06/2014 07:15   Dg Chest Port 1 View  06/04/2014   CLINICAL DATA:  Evaluate central line placement  EXAM: PORTABLE CHEST - 1 VIEW  COMPARISON:  Same day at 9:30 a.m.  FINDINGS: New left IJ central line, tip at the upper cavoatrial junction. Endotracheal and orogastric tubes remain in good position. There is no complicating pneumothorax or new mediastinal widening.  Normal heart size and aortic contours. Intervally  increased density of asymmetric to the left airspace disease.  IMPRESSION: 1. New left IJ central line, tip in good position.  No pneumothorax. 2. Increased pneumonia or noncardiogenic edema.   Electronically Signed   By: Monte Fantasia M.D.   On: 06/04/2014  13:23   Dg Chest Port 1 View  06/04/2014   CLINICAL DATA:  Intubation.  EXAM: PORTABLE CHEST - 1 VIEW  COMPARISON:  None.  FINDINGS: Endotracheal tube noted with tip 2.7 cm above the carina. NG tube noted with tip below left hemidiaphragm. Dense bilateral pulmonary infiltrates again noted. Interim partial clearing of right lower lobe atelectasis. Small right pleural effusion noted. Heart size normal. No pneumothorax. No acute bony abnormality .  IMPRESSION: 1. Interim intubation, endotracheal tube tip 2.7 cm above the carina. NG tube in stable position. 2. Persistent dense bilateral pulmonary infiltrates and small right pleural effusion. Interim partial clearing of right lower lobe atelectasis.   Electronically Signed   By: Marcello Moores  Register   On: 06/04/2014 09:41   Dg Chest Port 1 View  06/04/2014   CLINICAL DATA:  Shortness of Breath  EXAM: PORTABLE CHEST - 1 VIEW  COMPARISON:  June 02, 2014  FINDINGS: There is now extensive patchy airspace consolidation bilaterally. There is also consolidation in portions of the right middle and lower lobes. The heart size is normal. The pulmonary vascular is normal.  The pneumoperitoneum seen 1 day prior is not appreciable currently. There are surgical clips in the upper abdomen. Nasogastric tube tip and side port are in the stomach.  IMPRESSION: Extensive airspace consolidation bilaterally. Volume loss and significant portions of the right middle and lower lobes. Obstruction of the right bronchus intermedius is questioned. This finding may warrant bronchoscopy. Differential considerations for the current findings include pneumonia, aspiration, or possibly noncardiogenic edema. More than 1 of these entities may exist concurrently. Nasogastric tube tip and side port in stomach.   Electronically Signed   By: Lowella Grip III M.D.   On: 06/04/2014 08:30    Medications / Allergies:  Scheduled Meds: . antiseptic oral rinse  7 mL Mouth Rinse QID  . aspirin  81 mg Per  NG tube Daily  . chlorhexidine  15 mL Mouth Rinse BID  . fentaNYL  50 mcg Intravenous Once  . heparin subcutaneous  5,000 Units Subcutaneous 3 times per day  . hydrocortisone sod succinate (SOLU-CORTEF) inj  100 mg Intravenous Q12H  . insulin aspart  2-6 Units Subcutaneous 6 times per day  . pantoprazole (PROTONIX) IV  40 mg Intravenous Q24H  . piperacillin-tazobactam (ZOSYN)  IV  3.375 g Intravenous Q8H  . vancomycin  500 mg Intravenous Q12H   Continuous Infusions: . dextrose 5 % and 0.9 % NaCl with KCl 20 mEq/L 125 mL/hr at 06/06/14 0600  . fentaNYL infusion INTRAVENOUS 400 mcg/hr (06/06/14 0711)  . midazolam (VERSED) infusion 3 mg/hr (06/06/14 0712)  . phenylephrine (NEO-SYNEPHRINE) Adult infusion 30 mcg/min (06/06/14 0718)   PRN Meds:.Place/Maintain arterial line **AND** sodium chloride, acetaminophen, albuterol, fentaNYL, ipratropium, metoprolol, midazolam, midazolam, midazolam, ondansetron **OR** ondansetron (ZOFRAN) IV  Antibiotics: Anti-infectives    Start     Dose/Rate Route Frequency Ordered Stop   06/05/14 1000  vancomycin (VANCOCIN) 500 mg in sodium chloride 0.9 % 100 mL IVPB     500 mg 100 mL/hr over 60 Minutes Intravenous Every 12 hours 06/05/14 0839     06/04/14 1400  vancomycin (VANCOCIN) IVPB 1000 mg/200 mL premix  Status:  Discontinued     1,000  mg 200 mL/hr over 60 Minutes Intravenous Every 24 hours 06/04/14 1313 06/05/14 0839   06/03/14 0200  piperacillin-tazobactam (ZOSYN) IVPB 3.375 g     3.375 g 12.5 mL/hr over 240 Minutes Intravenous Every 8 hours 06/02/14 2346     06/02/14 1845  piperacillin-tazobactam (ZOSYN) IVPB 3.375 g     3.375 g 12.5 mL/hr over 240 Minutes Intravenous  Once 06/02/14 1833 06/02/14 2257       Assessment/Plan Crohn's disease(humira, prednisone) Colon perforation POD#4 exploratory laparotomy/partial colectomy colostomy---Dr. Excell Seltzer  -start trickle TF -wound VAC -IVF -continue foley -fentanyl for pain -zosyn D#4 ARDS -now in  ROSE study -appreciate CCM management -on full vent support -Vanc/Zosyn -respiratory culture 3/30 negative to date -blood cultures 3/30--->negative to date Hx MI/+troponin's -normal LVF per echo -cardiology following   Erby Pian, ANP-BC [place] Pager [phone](7A-4:30P)   06/06/2014 7:38 AM

## 2014-06-06 NOTE — Progress Notes (Signed)
RN/RT calling patient  - RASS now +1 +2 and patient agitated. AT RASS 0 to -1 patient vent dysnchronous as well.  Plan Goal rass -3/-4 and vent synchroncy - restart sedation Daily WUA - with goal RASS o0 to -1 + vent synchrony if possible. IF this cannot be achieved, increase sedation to RASS -3/-5   Dr. Brand Males, M.D., [facility].C.P Pulmonary and Critical Care Medicine Staff Physician [place] [facility] Pager: [phone], If no answer or between  15:00h - 7:00h: call [phone]  06/06/2014 1:13 PM

## 2014-06-06 NOTE — Progress Notes (Signed)
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 Prednisone and sulfasalazine. Admitted 06/02/2014 with colonic bowel perforation and s/p EXPLORATORY LAPAROTOMY/PARTIAL COLECTOMY WITH RESECTION OF COLOSTOMY And creation of new colostomy on 06/02/14. Had clear cxr at admit. Was doing well post op and then on 06/04/2014 over a matter of few hours develop sudden onset, rapidly progressive acute resp failure needing emergent intubation. Pink frothy sputum noted from ET tube post intubation. CXR showed bilateral alveolar infiltrates suggestive of acute pulmonary edema but final diagnosis 06/05/14 is ARDS   CHIEF COMPLAINT:  Post Op acute resp failure   SIGNIFICANT EVENTS: 06/02/2014 - admit 06/04/2014 - PCCM consult 06/05/14: ARDS NET + Start  NIH ROSE Trial (Re-evaluaton Of Systemic Early NMB) - randomized to sedation only Arm   SUBJECTIVE:  06/06/2014: Current RASS -4. Yesterday - vent dysnchrony with lighter sedation yesterday. PF ratio improved. CXR better    VITAL SIGNS: Temp:  [97.4 F (36.3 C)-98.6 F (37 C)] 98.4 F (36.9 C) (04/01 0800) Pulse Rate:  [50-67] 54 (04/01 0405) Resp:  [5-40] 35 (04/01 0845) BP: (50-153)/(30-77) 97/51 mmHg (04/01 0800) SpO2:  [89 %-99 %] 98 % (04/01 0845) Arterial Line BP: (91-113)/(37-48) 110/48 mmHg (04/01 0845) FiO2 (%):  [40 %-60 %] 40 % (04/01 0800) HEMODYNAMICS: CVP:  [14 mmHg-16 mmHg] 15 mmHg VENTILATOR SETTINGS: Vent Mode:  [-] PRVC FiO2 (%):  [40 %-60 %] 40 % Set Rate:  [28 bmp-35 bmp] 35 bmp Vt Set:  [280 mL-360 mL] 280 mL PEEP:  [8 cmH20-16  cmH20] 10 cmH20 Plateau Pressure:  [18 cmH20-30 cmH20] 25 cmH20 INTAKE / OUTPUT:  Intake/Output Summary (Last 24 hours) at 06/06/14 0852 Last data filed at 06/06/14 0800  Gross per 24 hour  Intake 4172.31 ml  Output   1855 ml  Net 2317.31 ml    PHYSICAL EXAMINATION: General:  Sedated on vent Neuro:  Sedated. Current RASS -3/-4 HEENT:  ET tube + NGT Cardiovascular:  Tachycardic, Normal heart sounds Lungs:  Decreased bs+ Abdomen:  Soft, dressing intact Musculoskeletal:  No cyanosis, no clubbing. No edema Skin:  Intact anteriorly  LABS:  PULMONARY  Recent Labs Lab 06/05/14 1200 06/05/14 1308 06/05/14 1745 06/05/14 1920 06/06/14 0405  PHART 7.282* 7.248* 7.289* 7.258* 7.251*  PCO2ART 42.7 47.1* 42.2 44.7 42.0  PO2ART 68.4* 88.0 87.7 141.0* 145.0*  HCO3 19.5* 19.8* 19.6* 19.3* 17.8*  TCO2 18.7 19.2 18.8 18.6 17.3  O2SAT 92.1 95.4 95.5 97.7 97.8    CBC  Recent Labs Lab 06/04/14 1030 06/05/14 0500 06/06/14 0425  HGB 10.8* 10.1* 8.9*  HCT 34.6* 32.2* 28.4*  WBC 15.2* 22.2* 15.4*  PLT 310 363 307    COAGULATION  Recent Labs Lab 06/04/14 1030 06/06/14 0425  INR 1.17 1.00    CARDIAC    Recent Labs Lab 06/04/14 0909 06/04/14 1030 06/04/14 1530 06/04/14 2010 06/05/14 0500  TROPONINI 0.12* 0.18* 0.31* 0.22* 0.14*   No results for input(s): PROBNP in the last 168 hours.   CHEMISTRY  Recent Labs Lab 06/03/14 0357 06/04/14 0335 06/04/14 1030 06/05/14  0500 06/06/14 0425  NA 136 138 137 133* 137  K 3.8 3.9 3.6 4.1 5.0  CL 106 108 107 105 115*  CO2 22 24 24 24 19   GLUCOSE 139* 109* 103* 143* 153*  BUN 11 10 9 10 10   CREATININE 0.88 0.87 0.93 0.83 0.86  CALCIUM 7.5* 7.5* 7.2* 6.8* 7.1*  MG  --   --  1.3* 2.4 2.4  PHOS  --   --  2.0* 2.3 2.0*   Estimated Creatinine Clearance: 55 mL/min (by C-G formula based on Cr of 0.86).   LIVER  Recent Labs Lab 06/02/14 1703 06/04/14 1030 06/06/14 0425  AST 24 62*  --   ALT 13 18  --   ALKPHOS  72 50  --   BILITOT 0.3 0.7  --   PROT 7.2 5.3*  --   ALBUMIN 3.8 2.3*  --   INR  --  1.17 1.00     INFECTIOUS  Recent Labs Lab 06/02/14 1712 06/04/14 1030 06/04/14 1040 06/05/14 0500 06/05/14 1358 06/06/14 0425  LATICACIDVEN 1.10  --  1.2  --  1.2  --   PROCALCITON  --  12.31  --  9.78  --  5.49     ENDOCRINE CBG (last 3)   Recent Labs  06/06/14 0030 06/06/14 0417 06/06/14 0828  GLUCAP 121* 136* 147*         IMAGING x48h Dg Chest 1 View  06/05/2014   CLINICAL DATA:  Respiratory failure.  EXAM: CHEST  1 VIEW  COMPARISON:  06/04/2014, 06/02/2014.  FINDINGS: Endotracheal tube, left IJ line, NG tube in stable position. Heart size stable. Persistent dense bilateral pulmonary infiltrates remain. Infiltrates have worsened from prior exam. No pleural effusion or pneumothorax.  IMPRESSION: 1. Lines and tubes in stable position. 2. Persistent dense bilateral pulmonary infiltrates. Infiltrates have worsened from prior exam.   Electronically Signed   By: [place]   On: 06/05/2014 07:44   Dg Chest Port 1 View  06/06/2014   CLINICAL DATA:  Respiratory failure.  EXAM: PORTABLE CHEST - 1 VIEW  COMPARISON:  06/05/2014 and 06/04/2014 and 06/01/2008  FINDINGS: Endotracheal tube is in good position. Central venous catheter tip is in the superior vena cava 3.8 cm below the carina. Bilateral pulmonary infiltrates have improved on the left and in the right upper lobe but I have worsened at the right base. Heart size and vascularity are normal.  IMPRESSION: Persistent bilateral pulmonary infiltrates consistent with ARDS. Infiltrates of increased at the right base but improved in the remainder of the lungs.   Electronically Signed   By: Lorriane Shire M.D.   On: 06/06/2014 07:15   Dg Chest Port 1 View  06/04/2014   CLINICAL DATA:  Evaluate central line placement  EXAM: PORTABLE CHEST - 1 VIEW  COMPARISON:  Same day at 9:30 a.m.  FINDINGS: New left IJ central line, tip at the upper  cavoatrial junction. Endotracheal and orogastric tubes remain in good position. There is no complicating pneumothorax or new mediastinal widening.  Normal heart size and aortic contours. Intervally increased density of asymmetric to the left airspace disease.  IMPRESSION: 1. New left IJ central line, tip in good position.  No pneumothorax. 2. Increased pneumonia or noncardiogenic edema.   Electronically Signed   By: Monte Fantasia M.D.   On: 06/04/2014 13:23   Dg Chest Port 1 View  06/04/2014   CLINICAL DATA:  Intubation.  EXAM: PORTABLE CHEST - 1 VIEW  COMPARISON:  None.  FINDINGS: Endotracheal tube noted with tip 2.7 cm above the carina. NG tube noted with tip below left hemidiaphragm. Dense bilateral pulmonary infiltrates again noted. Interim partial clearing of right lower lobe atelectasis. Small right pleural effusion noted. Heart size normal. No pneumothorax. No acute bony abnormality .  IMPRESSION: 1. Interim intubation, endotracheal tube tip 2.7 cm above the carina. NG tube in stable position. 2. Persistent dense bilateral pulmonary infiltrates and small right pleural effusion. Interim partial clearing of right lower lobe atelectasis.   Electronically Signed   By: Marcello Moores  Register   On: 06/04/2014 09:41        ASSESSMENT / PLAN:  PULMONARY OETT  06/04/2014 >>  A: Acute Post OP resp failure with sudden onste new bilateral pulmonary infiltrates 06/04/14 . Associated with pink frothy secretions  -  SEvere ARDS 06/05/14 (Acute pulm edema ruled out)    - Improved PF ratio and CXR  P:   ARDS Net protocol for vent - Vt goal 6cc/kg/IBW - to extent possible kee at 6/cc/kg/IBW ROSE NIH PETAL NETWORK TRIAL - on std of care sedation arm - avoid NMB blockade  - If clinically NMB blocakade which is Plat > 32cm x 10 minutes - try increasing sedation, Decrease Vt, decrease Peep before Rx with NMB   -  refer to Page 24 - bedside protocol for precise  criteria and loop out  - Diuresis per Trial Protocol   see CVS section  CARDIOVASCULAR CVL planned 3/30>>  A:  #Hx of MI s/p stents in 1980s during age 62s #Initial concern for Acute pulmonary edema 3/3/0/16 - ruled out   - still on neo at 64mcg  P:  Vasopressor for MAP > 65 - MD std of care driven Diuresis >/=  12h after patient off vasopressors    - CVP goal 4 per Trial protocol  - diuresis is nursing driven per [facility] bedside flow chart and will be weighted by CVP, MAP and Urine Output   RENAL A:  Intact but at risk for AKI   - low phos wuith high K  P:   Replete phos Change fluids - no maintenance K Maintain volume and hemodynamics  GASTROINTESTINAL A:   Active chron's patient S/p p-lap 328/16 for colonic perf P:   Per ccs  HEMATOLOGIC A:  AT risk for anemia of critical illness P:  PRBC per ICU guidelines  INFECTIOUS BCx2 >> UC >> Tracheal aspirate for bacteria and virus 06/04/14 >> few candida albicans  A:   Baseline  - Immunocmpromised patient due to humira + prednisone Current   - admit with bowel perf / peritonitis 06/02/14  - pulmonary event -06/04/14 ARDS - trach aspirate with few candida   P:   Pan culture as above Zosyn 3/29>> Vanc 3/31 >> Micafungin 06/06/14 (candida in BAL) >>    ENDOCRINE A:  Chronic pred P:   Stress dose hydrocort - aim to taper asap due to bowel perf surgery SSI  NEUROLOGIC A:   #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  #currently  - ARDS NET sedation - ROSE TRIAL - randomized 06/05/14 to sedation only arm  - Ideal RASS goal is 0 to -1 per Trial protocol with vent synchrony   - 06/06/14: vent dysnchrony reported at RASS -2 on 06/05/14 but WUA pending 06/06/2014   P:   For 06/06/14 will try RASS goal 0 to -1 but if dysnchrony will document and go for RASS goal -3 Avoid NMB - exceptions see  page 24 of bedside protocol  FAMILY  - Updates: duaghter updated in detail over phone 06/05/2014      GLOBAL  severe aRDS and septic shock     The  patient is critically ill with multiple organ systems failure and requires high complexity decision making for assessment and support, frequent evaluation and titration of therapies, application of advanced monitoring technologies and extensive interpretation of multiple databases.   Critical Care Time devoted to patient care services described in this note is  45  Minutes. This time reflects time of care of this signee Dr Brand Males. This critical care time does not reflect procedure time, or teaching time or supervisory time of PA/NP/Med student/Med Resident etc but could involve care discussion time    Dr. Brand Males, M.D., [facility].C.P Pulmonary and Critical Care Medicine Staff Physician [place] [facility]monary and Critical Care Pager: [phone], If no answer or between  15:00h - 7:00h: call [phone]  06/06/2014 9:30 AM

## 2014-06-06 NOTE — Progress Notes (Signed)
Rt went to do 16:00 vent CK pt still breathing at RR of 49 and breath stacking. RN called MD and orders was placed to start NIMBEX. Once NIMBEX was started pt recovered quickly.

## 2014-06-06 NOTE — Progress Notes (Signed)
Per Ramaswamy sedation was turned off and fentanyl reduced this AM to maintain a RASS of -1 to -2. Patient now at rass of -1 to -2 but patient is breathing 45 times a minute and dyssynchronous with the ventilator. Sedation restarted and fentanyl increased to make patient synchronous with ventilator.

## 2014-06-06 NOTE — Progress Notes (Signed)
NUTRITION FOLLOW UP  Intervention:   Initiate trickle feeds of Vital AF 1.2 @ 20 ml/hr via OGT  Advance per surgery When medically able, advance by 10 ml every 4 hours to goal rate of 50 ml/hr.    Tube feeding goal rate regimen provides 1440 kcal (95% of needs), 90 grams of protein, and 972 ml of H2O.   Nutrition Dx:   Inadequate oral intake related to inability to eat as evidenced by NPO. ongoing  Goal:   Pt to meet >/= 90% of their estimated energy requirements. Not met  Monitor:   TF regimen & tolerance, respiratory status, weight, labs, I/O's  Assessment:   40 yoF admitted with colonic bowel perforation. S/p ex lap with partial colectomy 3/28. In ICU with postop respiratory failure on 3/30, requiring emergent intubation. CXR showed bilateral infiltrates suggestive of acute pulmonary edema. WBC elevated and patient is febrile. Patient has a long history of Crohn's disease with at least 2 previous partial colectomies and left-sided ostomy.  Consult received for trickle feeds  Patient is currently intubated on ventilator support  MV: 9.7 L/min Temp (24hrs), Min:98.3 F (36.8 C), Max: 98.7 F (37.6 C) Propofol: none Labs reviewed: Glu 153, Ca 7.1, phos 2.0, CL 115   Height: Ht Readings from Last 1 Encounters:  06/04/14 5' (1.524 m)    Weight Status:   Wt Readings from Last 1 Encounters:  06/05/14 122 lb 9.2 oz (55.6 kg)    Re-estimated needs:  Kcal: 1514 kcal Protein: 80 - 90g Fluid: per MD  Skin: WDL  Diet Order: Diet NPO time specified Except for: Ice Chips   Intake/Output Summary (Last 24 hours) at 06/06/14 1058 Last data filed at 06/06/14 0800  Gross per 24 hour  Intake 3984.81 ml  Output   1455 ml  Net 2529.81 ml    Last BM: 03/31 (50 ml via ostomy bag)   Labs:   Recent Labs Lab 06/04/14 1030 06/05/14 0500 06/06/14 0425  NA 137 133* 137  K 3.6 4.1 5.0  CL 107 105 115*  CO2 $Re'24 24 19  'bav$ BUN $R'9 10 10  'Ym$ CREATININE 0.93 0.83 0.86  CALCIUM  7.2* 6.8* 7.1*  MG 1.3* 2.4 2.4  PHOS 2.0* 2.3 2.0*  GLUCOSE 103* 143* 153*    CBG (last 3)   Recent Labs  06/06/14 0030 06/06/14 0417 06/06/14 0828  GLUCAP 121* 136* 147*    Scheduled Meds: . antiseptic oral rinse  7 mL Mouth Rinse QID  . aspirin  81 mg Per NG tube Daily  . chlorhexidine  15 mL Mouth Rinse BID  . heparin subcutaneous  5,000 Units Subcutaneous 3 times per day  . hydrocortisone sod succinate (SOLU-CORTEF) inj  100 mg Intravenous Q12H  . insulin aspart  2-6 Units Subcutaneous 6 times per day  . micafungin [facility]) IV  100 mg Intravenous Daily  . pantoprazole (PROTONIX) IV  40 mg Intravenous Q24H  . piperacillin-tazobactam (ZOSYN)  IV  3.375 g Intravenous Q8H  . sodium phosphate  Dextrose 5% IVPB  10 mmol Intravenous Once  . vancomycin  500 mg Intravenous Q12H    Continuous Infusions: . dextrose 5 % and 0.45% NaCl 100 mL/hr at 06/06/14 1034  . fentaNYL infusion INTRAVENOUS 75 mcg/hr (06/06/14 1000)  . midazolam (VERSED) infusion Stopped (06/06/14 0930)  . phenylephrine (NEO-SYNEPHRINE) Adult infusion Stopped (06/06/14 0930)    Lyall Faciane A. [facility] Dietetic Intern Pager: [phone] 06/06/2014 11:20 AM

## 2014-06-06 NOTE — Care Management Note (Signed)
CARE MANAGEMENT NOTE 06/06/2014  Patient:  [name], [name]   Account Number:  [id number]  Date Initiated:  06/06/2014  Documentation initiated by:  Anastacio Bua  Subjective/Objective Assessment:   had outpt colonscopy sustaned perforated bowel and was taken to or for colostomy has hx of Crohn's disease     Action/Plan:   home when stable   Anticipated DC Date:  06/09/2014   Anticipated DC Plan:  HOME/SELF CARE  In-house referral  NA      DC Planning Services  CM consult      PAC Choice  NA   Choice offered to / List presented to:  NA           Status of service:  In process, will continue to follow Medicare Important Message given?   (If response is "NO", the following Medicare IM given date fields will be blank) Date Medicare IM given:   Medicare IM given by:   Date Additional Medicare IM given:   Additional Medicare IM given by:    Discharge Disposition:    Per UR Regulation:  Reviewed for med. necessity/level of care/duration of stay  If discussed at [place] of Stay Meetings, dates discussed:    Comments:  [id number]/Atiyana Welte Rosana Hoes RN, BSN, CCN: [phone] Case management. Chart reviewed for discharge planning and present needs. Discharge needs: none present at time of review.   June 03, 2014/Isa Hitz L. Rosana Hoes, RN, BSN, CCM. Case Management [place] [phone] No discharge needs present of time of review.

## 2014-06-06 NOTE — Consult Note (Signed)
WOC ostomy follow up Stoma type/location: LLQ Colostomy Stomal assessment/size:1 and 3/4 inch round in a circumferential gulley.  OS at center  Peristomal assessment: intact, clear Treatment options for stomal/peristomal skin: skin barrier ring Output thin, brown effluent Ostomy pouching: 2pc. 2 and 1/4 inch pouching system with skin barrier ring Education provided: none Enrolled patient in [employer] Discharge program: No  WOC wound follow up Wound type:Surgical left open to heal by secondary intention Measurement:14.5cm x 2.5cm x 2.5cm Wound KTG:YBWL pink, moist Drainage (amount, consistency, odor) serous Periwound:intact, clear Dressing procedure/placement/frequency:NPWT with 177mmHg negative continuous pressure.  1 piece of black foam used to obliterate dead space. [place] nursing team will follow, and will remain available to this patient, the nursing, surgical and medical teams.  Next dressing/pouch change is due Monday, 06/09/14. Thanks, Maudie Flakes, MSN, RN, [place], [place], [place] [phone])

## 2014-06-07 ENCOUNTER — Inpatient Hospital Stay (HOSPITAL_COMMUNITY): Payer: Commercial Managed Care - HMO

## 2014-06-07 DIAGNOSIS — Z9889 Other specified postprocedural states: Secondary | ICD-10-CM

## 2014-06-07 DIAGNOSIS — I2510 Atherosclerotic heart disease of native coronary artery without angina pectoris: Secondary | ICD-10-CM

## 2014-06-07 DIAGNOSIS — A419 Sepsis, unspecified organism: Secondary | ICD-10-CM

## 2014-06-07 DIAGNOSIS — R6521 Severe sepsis with septic shock: Secondary | ICD-10-CM

## 2014-06-07 DIAGNOSIS — J810 Acute pulmonary edema: Secondary | ICD-10-CM

## 2014-06-07 DIAGNOSIS — E43 Unspecified severe protein-calorie malnutrition: Secondary | ICD-10-CM

## 2014-06-07 DIAGNOSIS — K50119 Crohn's disease of large intestine with unspecified complications: Secondary | ICD-10-CM

## 2014-06-07 DIAGNOSIS — R0989 Other specified symptoms and signs involving the circulatory and respiratory systems: Secondary | ICD-10-CM

## 2014-06-07 LAB — BLOOD GAS, ARTERIAL
ACID-BASE DEFICIT: 6.6 mmol/L — AB (ref 0.0–2.0)
Bicarbonate: 19.9 mEq/L — ABNORMAL LOW (ref 20.0–24.0)
Drawn by: 308601
FIO2: 0.4 %
LHR: 35 {breaths}/min
MECHVT: 280 mL
O2 Saturation: 85.6 %
PEEP/CPAP: 10 cmH2O
PO2 ART: 57.3 mmHg — AB (ref 80.0–100.0)
Patient temperature: 37
TCO2: 19.3 mmol/L (ref 0–100)
pCO2 arterial: 47.5 mmHg — ABNORMAL HIGH (ref 35.0–45.0)
pH, Arterial: 7.245 — ABNORMAL LOW (ref 7.350–7.450)

## 2014-06-07 LAB — CBC WITH DIFFERENTIAL/PLATELET
BASOS ABS: 0 10*3/uL (ref 0.0–0.1)
Basophils Relative: 0 % (ref 0–1)
EOS PCT: 0 % (ref 0–5)
Eosinophils Absolute: 0 10*3/uL (ref 0.0–0.7)
HCT: 26.4 % — ABNORMAL LOW (ref 36.0–46.0)
HEMOGLOBIN: 8.3 g/dL — AB (ref 12.0–15.0)
LYMPHS ABS: 0.7 10*3/uL (ref 0.7–4.0)
Lymphocytes Relative: 5 % — ABNORMAL LOW (ref 12–46)
MCH: 30.3 pg (ref 26.0–34.0)
MCHC: 31.4 g/dL (ref 30.0–36.0)
MCV: 96.4 fL (ref 78.0–100.0)
MONOS PCT: 6 % (ref 3–12)
Monocytes Absolute: 0.8 10*3/uL (ref 0.1–1.0)
NEUTROS PCT: 89 % — AB (ref 43–77)
Neutro Abs: 12 10*3/uL — ABNORMAL HIGH (ref 1.7–7.7)
PLATELETS: 278 10*3/uL (ref 150–400)
RBC: 2.74 MIL/uL — ABNORMAL LOW (ref 3.87–5.11)
RDW: 13.9 % (ref 11.5–15.5)
WBC: 13.5 10*3/uL — ABNORMAL HIGH (ref 4.0–10.5)

## 2014-06-07 LAB — BASIC METABOLIC PANEL
ANION GAP: 4 — AB (ref 5–15)
BUN: 9 mg/dL (ref 6–23)
CHLORIDE: 114 mmol/L — AB (ref 96–112)
CO2: 20 mmol/L (ref 19–32)
CREATININE: 0.67 mg/dL (ref 0.50–1.10)
Calcium: 7.1 mg/dL — ABNORMAL LOW (ref 8.4–10.5)
GFR calc Af Amer: >90 mL/min (ref >90)
GFR calc non Af Amer: >90 mL/min (ref >90)
GLUCOSE: 120 mg/dL — AB (ref 70–99)
Potassium: 4.1 mmol/L (ref 3.5–5.1)
SODIUM: 138 mmol/L (ref 135–145)

## 2014-06-07 LAB — GLUCOSE, CAPILLARY
GLUCOSE-CAPILLARY: 94 mg/dL (ref 70–99)
Glucose-Capillary: 123 mg/dL — ABNORMAL HIGH (ref 70–99)
Glucose-Capillary: 106 mg/dL — ABNORMAL HIGH (ref 70–99)
Glucose-Capillary: 110 mg/dL — ABNORMAL HIGH (ref 70–99)
Glucose-Capillary: 90 mg/dL (ref 70–99)

## 2014-06-07 LAB — URINE CULTURE
COLONY COUNT: NO GROWTH
Culture: NO GROWTH

## 2014-06-07 LAB — PHOSPHORUS: Phosphorus: 2.2 mg/dL — ABNORMAL LOW (ref 2.3–4.6)

## 2014-06-07 LAB — VANCOMYCIN, TROUGH: VANCOMYCIN TR: 15.5 ug/mL (ref 10.0–20.0)

## 2014-06-07 LAB — MAGNESIUM: Magnesium: 2 mg/dL (ref 1.5–2.5)

## 2014-06-07 MED ORDER — FUROSEMIDE 10 MG/ML IJ SOLN
INTRAMUSCULAR | Status: AC
  Filled 2014-06-07: qty 4

## 2014-06-07 MED ORDER — FUROSEMIDE 10 MG/ML IJ SOLN
40.0000 mg | Freq: Once | INTRAMUSCULAR | Status: AC
  Administered 2014-06-07: 40 mg via INTRAVENOUS
  Filled 2014-06-07: qty 4

## 2014-06-07 MED ORDER — FUROSEMIDE 10 MG/ML IJ SOLN
20.0000 mg | Freq: Once | INTRAMUSCULAR | Status: AC
  Administered 2014-06-07: 20 mg via INTRAVENOUS

## 2014-06-07 NOTE — Progress Notes (Signed)
10cc Versed drip wasted in sink from 50mg  in 50cc bag.  Witnessed by Marcelline Deist, RN.

## 2014-06-07 NOTE — Progress Notes (Signed)
5 Days Post-Op partial colectomy and colostomy Subjective: Pt with ARDS on vent, sedated and paralyzed   Objective: Vital signs in last 24 hours: Temp:  [97 F (36.1 C)-98.6 F (37 C)] 98.2 F (36.8 C) (04/02 0800) Pulse Rate:  [75-88] 86 (04/02 0700) Resp:  [35-52] 35 (04/02 0814) BP: (84-129)/(47-59) 117/47 mmHg (04/02 0305) SpO2:  [87 %-100 %] 93 % (04/02 0700) Arterial Line BP: (91-188)/(39-71) 151/63 mmHg (04/02 0814) FiO2 (%):  [40 %-50 %] 40 % (04/02 0810)   Intake/Output from previous day: 04/01 0701 - 04/02 0700 In: 3915.3 [I.V.:3101.9; IV Piggyback:803.3] Out: 1025 [Urine:925; Drains:100] Intake/Output this shift:     General appearance: sedated GI: soft, non-distended, ostomy viable  Incision: vac in place  Lab Results:   Recent Labs  06/06/14 0425 06/07/14 0400  WBC 15.4* 13.5*  HGB 8.9* 8.3*  HCT 28.4* 26.4*  PLT 307 278   BMET  Recent Labs  06/06/14 0425 06/07/14 0400  NA 137 138  K 5.0 4.1  CL 115* 114*  CO2 19 20  GLUCOSE 153* 120*  BUN 10 9  CREATININE 0.86 0.67  CALCIUM 7.1* 7.1*   PT/INR  Recent Labs  06/04/14 1030 06/06/14 0425  LABPROT 15.0 13.3  INR 1.17 1.00   ABG  Recent Labs  06/06/14 0405 06/07/14 0425  PHART 7.251* 7.245*  HCO3 17.8* 19.9*    MEDS, Scheduled . antiseptic oral rinse  7 mL Mouth Rinse QID  . artificial tears  1 application Both Eyes 3 times per day  . aspirin  81 mg Per NG tube Daily  . chlorhexidine  15 mL Mouth Rinse BID  . heparin subcutaneous  5,000 Units Subcutaneous 3 times per day  . hydrocortisone sod succinate (SOLU-CORTEF) inj  100 mg Intravenous Q12H  . insulin aspart  2-6 Units Subcutaneous 6 times per day  . micafungin [facility]) IV  100 mg Intravenous Daily  . pantoprazole (PROTONIX) IV  40 mg Intravenous Q24H  . piperacillin-tazobactam (ZOSYN)  IV  3.375 g Intravenous Q8H  . vancomycin  500 mg Intravenous Q12H    Studies/Results: Dg Chest Port 1 View  06/06/2014    CLINICAL DATA:  Respiratory failure.  EXAM: PORTABLE CHEST - 1 VIEW  COMPARISON:  06/05/2014 and 06/04/2014 and 06/01/2008  FINDINGS: Endotracheal tube is in good position. Central venous catheter tip is in the superior vena cava 3.8 cm below the carina. Bilateral pulmonary infiltrates have improved on the left and in the right upper lobe but I have worsened at the right base. Heart size and vascularity are normal.  IMPRESSION: Persistent bilateral pulmonary infiltrates consistent with ARDS. Infiltrates of increased at the right base but improved in the remainder of the lungs.   Electronically Signed   By: Lorriane Shire M.D.   On: 06/06/2014 07:15    Assessment: s/p Procedure(s): EXPLORATORY LAPAROTOMY/PARTIAL COLECTOMY COLOSTOMY Patient Active Problem List   Diagnosis Date Noted  . Protein-calorie malnutrition, severe 06/06/2014  . ARDS (adult respiratory distress syndrome) 06/05/2014  . Septic shock 06/05/2014  . Acute respiratory failure with hypoxia 06/04/2014  . Acute pulmonary edema 06/04/2014  . Elevated troponin 06/04/2014  . Crohn's colitis   . Acute respiratory failure with hypoxemia   . Perforation of colon 06/02/2014  . Abdominal pain, acute, left lower quadrant 06/17/2013  . CAD (coronary artery disease) of artery bypass graft 05/17/2013  . Osteoporosis, unspecified 05/17/2013  . RECTAL PAIN 12/01/2009  . COLITIS, ULCERATIVE NOS 07/31/2008  . CORONARY ARTERY DISEASE 05/03/2007  .  ANAL FISTULA 05/03/2007  . 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  Would advance slowly (q12h) and monitor residuals q4h   LOS: 5 days     .Rosario Adie, [facility], [place]   06/07/2014 8:29 AM

## 2014-06-07 NOTE — Progress Notes (Signed)
ANTIBIOTIC CONSULT NOTE - FOLLOW UP  Pharmacy Consult for Vancomycin Indication: pneumonia  Allergies  Allergen Reactions  . Remicade [Infliximab]     convulsions    Patient Measurements: Height: 5' (152.4 cm) Weight: 122 lb 9.2 oz (55.6 kg) IBW/kg (Calculated) : 45.5 Adjusted Body Weight:   Vital Signs: Temp: 98.8 F (37.1 C) (04/02 2000) Temp Source: Oral (04/02 2000) BP: 114/42 mmHg (04/02 2300) Pulse Rate: 60 (04/02 2300) Intake/Output from previous day: 04/01 0701 - 04/02 0700 In: 3998.6 [I.V.:3185.2; IV Piggyback:803.3] Out: 1025 [Urine:925; Drains:100] Intake/Output from this shift: Total I/O In: 377 [I.V.:277; IV Piggyback:100] Out: 1100 [Urine:975; Stool:125]  Labs:  Recent Labs  06/05/14 0500 06/06/14 0425 06/07/14 0400  WBC 22.2* 15.4* 13.5*  HGB 10.1* 8.9* 8.3*  PLT 363 307 278  CREATININE 0.83 0.86 0.67   Estimated Creatinine Clearance: 59.2 mL/min (by C-G formula based on Cr of 0.67).  Recent Labs  06/07/14 2100  Kaplan 15.5     Microbiology: Recent Results (from the past 720 hour(s))  MRSA PCR Screening     Status: None   Collection Time: 06/02/14 11:50 PM  Result Value Ref Range Status   MRSA by PCR NEGATIVE NEGATIVE Final    Comment:        The GeneXpert MRSA Assay (FDA approved for NASAL specimens only), is one component of a comprehensive MRSA colonization surveillance program. It is not intended to diagnose MRSA infection nor to guide or monitor treatment for MRSA infections.   Culture, respiratory (NON-Expectorated)     Status: None   Collection Time: 06/04/14 10:11 AM  Result Value Ref Range Status   Specimen Description TRACHEAL ASPIRATE  Final   Special Requests Immunocompromised  Final   Gram Stain   Final    RARE WBC PRESENT, PREDOMINANTLY MONONUCLEAR RARE SQUAMOUS EPITHELIAL CELLS PRESENT NO ORGANISMS SEEN Performed at [facility]    Culture   Final    FEW CANDIDA ALBICANS Performed at [facility]    Report Status 06/06/2014 FINAL  Final  Culture, Urine     Status: None   Collection Time: 06/04/14 10:20 AM  Result Value Ref Range Status   Specimen Description URINE, CATHETERIZED  Final   Special Requests Immunocompromised  Final   Colony Count NO GROWTH Performed at [facility]   Final   Culture NO GROWTH Performed at [facility]   Final   Report Status 06/07/2014 FINAL  Final  Culture, blood (routine x 2)     Status: None (Preliminary result)   Collection Time: 06/04/14 10:30 AM  Result Value Ref Range Status   Specimen Description BLOOD LEFT HAND  Final   Special Requests BOTTLES DRAWN AEROBIC ONLY 2CC  Final   Culture   Final           BLOOD CULTURE RECEIVED NO GROWTH TO DATE CULTURE WILL BE HELD FOR 5 DAYS BEFORE ISSUING A FINAL NEGATIVE REPORT Performed at [facility]    Report Status PENDING  Incomplete  Culture, blood (routine x 2)     Status: None (Preliminary result)   Collection Time: 06/04/14 10:40 AM  Result Value Ref Range Status   Specimen Description BLOOD RIGHT ARM  Final   Special Requests BOTTLES DRAWN AEROBIC ONLY [place]  Final   Culture   Final           BLOOD CULTURE RECEIVED NO GROWTH TO DATE CULTURE WILL BE HELD FOR 5 DAYS BEFORE ISSUING A FINAL  NEGATIVE REPORT Performed at [facility]    Report Status PENDING  Incomplete  Respiratory virus panel     Status: None   Collection Time: 06/04/14 11:03 AM  Result Value Ref Range Status   Respiratory Syncytial Virus A Negative Negative Final   Respiratory Syncytial Virus B Negative Negative Final   Influenza A Negative Negative Final   Influenza B Negative Negative Final   Parainfluenza 1 Negative Negative Final   Parainfluenza 2 Negative Negative Final   Parainfluenza 3 Negative Negative Final   Metapneumovirus Negative Negative Final   Rhinovirus Negative Negative Final   Adenovirus Negative Negative Final    Comment: (NOTE) Performed At: [facility] [place], [place] [id number] Lindon Romp MD [id number]     Anti-infectives    Start     Dose/Rate Route Frequency Ordered Stop   06/06/14 1200  micafungin (MYCAMINE) 100 mg in sodium chloride 0.9 % 100 mL IVPB     100 mg 100 mL/hr over 1 Hours Intravenous Daily 06/06/14 0944     06/05/14 1000  vancomycin (VANCOCIN) 500 mg in sodium chloride 0.9 % 100 mL IVPB     500 mg 100 mL/hr over 60 Minutes Intravenous Every 12 hours 06/05/14 0839     06/04/14 1400  vancomycin (VANCOCIN) IVPB 1000 mg/200 mL premix  Status:  Discontinued     1,000 mg 200 mL/hr over 60 Minutes Intravenous Every 24 hours 06/04/14 1313 06/05/14 0839   06/03/14 0200  piperacillin-tazobactam (ZOSYN) IVPB 3.375 g     3.375 g 12.5 mL/hr over 240 Minutes Intravenous Every 8 hours 06/02/14 2346     06/02/14 1845  piperacillin-tazobactam (ZOSYN) IVPB 3.375 g     3.375 g 12.5 mL/hr over 240 Minutes Intravenous  Once 06/02/14 1833 06/02/14 2257      Assessment: Vancomycin trough at goal.  Prior doses given mostly on schedule.    Goal of Therapy:  Vancomycin trough level 15-20 mcg/ml  Plan:  Measure antibiotic drug levels at steady state Follow up culture results Continue with current dosing and recheck levels as needed  [name] 06/07/2014,11:45 PM

## 2014-06-07 NOTE — Progress Notes (Signed)
SUBJECTIVE: Intubated and sedated.     Intake/Output Summary (Last 24 hours) at 06/07/14 0814 Last data filed at 06/07/14 0600  Gross per 24 hour  Intake 3559.83 ml  Output   1025 ml  Net 2534.83 ml    Current Facility-Administered Medications  Medication Dose Route Frequency Provider Last Rate Last Dose  . 0.9 %  sodium chloride infusion   Intra-arterial PRN Brand Males, MD      . acetaminophen (TYLENOL) suppository 650 mg  650 mg Rectal Q4H PRN Donita Brooks, NP      . albuterol (PROVENTIL) (2.5 MG/3ML) 0.083% nebulizer solution 2.5 mg  2.5 mg Nebulization H6P PRN Leighton Ruff, MD   2.5 mg at 06/04/14 0900  . antiseptic oral rinse (CPC / CETYLPYRIDINIUM CHLORIDE 0.05%) solution 7 mL  7 mL Mouth Rinse QID Brand Males, MD   7 mL at 06/07/14 0403  . artificial tears (LACRILUBE) ophthalmic ointment 1 application  1 application Both Eyes 3 times per day Chesley Mires, MD   1 application at 59/16/38 0522  . aspirin chewable tablet 81 mg  81 mg Per NG tube Daily Lelon Perla, MD   81 mg at 06/06/14 1048  . chlorhexidine (PERIDEX) 0.12 % solution 15 mL  15 mL Mouth Rinse BID Brand Males, MD   15 mL at 06/06/14 2055  . cisatracurium (NIMBEX) 200 mg in sodium chloride 0.9 % 200 mL (1 mg/mL) infusion  3-10 mcg/kg/min Intravenous Titrated Chesley Mires, MD 8.3 mL/hr at 06/07/14 0430 2.5 mcg/kg/min at 06/07/14 0430  . dextrose 5 %-0.45 % sodium chloride infusion   Intravenous Continuous Anders Simmonds, MD 50 mL/hr at 06/07/14 0518    . fentaNYL (SUBLIMAZE) 2,500 mcg in sodium chloride 0.9 % 250 mL (10 mcg/mL) infusion  25-300 mcg/hr Intravenous Continuous Chesley Mires, MD 20 mL/hr at 06/07/14 0544 200 mcg/hr at 06/07/14 0544  . fentaNYL (SUBLIMAZE) bolus via infusion 50 mcg  50 mcg Intravenous Q30 min PRN Chesley Mires, MD      . heparin injection 5,000 Units  5,000 Units Subcutaneous 3 times per day Lelon Perla, MD   5,000 Units at 06/07/14 0518  . hydrocortisone  sodium succinate (SOLU-CORTEF) 100 MG injection 100 mg  100 mg Intravenous Q12H Brand Males, MD   100 mg at 06/06/14 2201  . insulin aspart (novoLOG) injection 2-6 Units  2-6 Units Subcutaneous 6 times per day Brand Males, MD   4 Units at 06/06/14 1200  . ipratropium (ATROVENT) nebulizer solution 0.5 mg  0.5 mg Nebulization G6K PRN Leighton Ruff, MD   0.5 mg at 06/04/14 0900  . metoprolol (LOPRESSOR) injection 5 mg  5 mg Intravenous Q6H PRN Emina Riebock, NP      . micafungin (MYCAMINE) 100 mg in sodium chloride 0.9 % 100 mL IVPB  100 mg Intravenous Daily Brand Males, MD   100 mg at 06/06/14 1254  . ondansetron (ZOFRAN) tablet 4 mg  4 mg Oral Q6H PRN Excell Seltzer, MD       Or  . ondansetron [facility]) injection 4 mg  4 mg Intravenous Q6H PRN Excell Seltzer, MD      . pantoprazole (PROTONIX) injection 40 mg  40 mg Intravenous Q24H Vishal Mungal, MD   40 mg at 06/06/14 1754  . phenylephrine (NEO-SYNEPHRINE) 40 mg in dextrose 5 % 250 mL (0.16 mg/mL) infusion  30-200 mcg/min Intravenous Continuous Md Ccs, MD   Stopped at 06/06/14 0930  . piperacillin-tazobactam (ZOSYN)  IVPB 3.375 g  3.375 g Intravenous Q8H Excell Seltzer, MD   3.375 g at 06/07/14 0253  . propofol (DIPRIVAN) 10 mg/ml infusion  5-80 mcg/kg/min Intravenous Continuous Chesley Mires, MD 8.3 mL/hr at 06/07/14 0100 25 mcg/kg/min at 06/07/14 0100  . vancomycin (VANCOCIN) 500 mg in sodium chloride 0.9 % 100 mL IVPB  500 mg Intravenous Q12H Donald Prose Runyon, RPH   500 mg at 06/06/14 2201    Filed Vitals:   06/07/14 0500 06/07/14 0600 06/07/14 0700 06/07/14 0800  BP:      Pulse: 87 81 86   Temp:    98.2 F (36.8 C)  TempSrc:      Resp: 35 35 35   Height:      Weight:      SpO2: 91% 96% 93%     PHYSICAL EXAM General: Intubated, sedated. HEENT: Normal. Neck: Supple.  Lungs: Coarse b/l. CV: Regular rate and rhythm, normal S1/S2, no S3/S4, no murmur.  No pretibial edema. Extremities are warm to palpation. Abdomen:  Soft, no distention.  Neurologic: Sedated.   TELEMETRY: Reviewed telemetry pt in normal sinus rhythm.  LABS: Basic Metabolic Panel:  Recent Labs  06/06/14 0425 06/07/14 0400  NA 137 138  K 5.0 4.1  CL 115* 114*  CO2 19 20  GLUCOSE 153* 120*  BUN 10 9  CREATININE 0.86 0.67  CALCIUM 7.1* 7.1*  MG 2.4 2.0  PHOS 2.0* 2.2*   Liver Function Tests:  Recent Labs  06/04/14 1030  AST 62*  ALT 18  ALKPHOS 50  BILITOT 0.7  PROT 5.3*  ALBUMIN 2.3*   No results for input(s): LIPASE, AMYLASE in the last 72 hours. CBC:  Recent Labs  06/06/14 0425 06/07/14 0400  WBC 15.4* 13.5*  NEUTROABS 13.8* 12.0*  HGB 8.9* 8.3*  HCT 28.4* 26.4*  MCV 96.3 96.4  PLT 307 278   Cardiac Enzymes:  Recent Labs  06/04/14 0909  06/04/14 1530 06/04/14 2010 06/05/14 0500  CKTOTAL 1246*  --   --   --   --   CKMB 18.7*  --   --   --   --   TROPONINI 0.12*  < > 0.31* 0.22* 0.14*  < > = values in this interval not displayed. BNP: Invalid input(s): POCBNP D-Dimer: No results for input(s): DDIMER in the last 72 hours. Hemoglobin A1C: No results for input(s): HGBA1C in the last 72 hours. Fasting Lipid Panel:  Recent Labs  06/04/14 2010  TRIG 104   Thyroid Function Tests: No results for input(s): TSH, T4TOTAL, T3FREE, THYROIDAB in the last 72 hours.  Invalid input(s): FREET3 Anemia Panel: No results for input(s): VITAMINB12, FOLATE, FERRITIN, TIBC, IRON, RETICCTPCT in the last 72 hours.  RADIOLOGY: Dg Chest 1 View  06/05/2014   CLINICAL DATA:  Respiratory failure.  EXAM: CHEST  1 VIEW  COMPARISON:  06/04/2014, 06/02/2014.  FINDINGS: Endotracheal tube, left IJ line, NG tube in stable position. Heart size stable. Persistent dense bilateral pulmonary infiltrates remain. Infiltrates have worsened from prior exam. No pleural effusion or pneumothorax.  IMPRESSION: 1. Lines and tubes in stable position. 2. Persistent dense bilateral pulmonary infiltrates. Infiltrates have worsened from  prior exam.   Electronically Signed   By: [place]ia   On: 06/05/2014 07:44   Dg Chest Port 1 View  06/06/2014   CLINICAL DATA:  Respiratory failure.  EXAM: PORTABLE CHEST - 1 VIEW  COMPARISON:  06/05/2014 and 06/04/2014 and 06/01/2008  FINDINGS: Endotracheal tube is in good position. Central  venous catheter tip is in the superior vena cava 3.8 cm below the carina. Bilateral pulmonary infiltrates have improved on the left and in the right upper lobe but I have worsened at the right base. Heart size and vascularity are normal.  IMPRESSION: Persistent bilateral pulmonary infiltrates consistent with ARDS. Infiltrates of increased at the right base but improved in the remainder of the lungs.   Electronically Signed   By: Lorriane Shire M.D.   On: 06/06/2014 07:15   Dg Chest Port 1 View  06/04/2014   CLINICAL DATA:  Evaluate central line placement  EXAM: PORTABLE CHEST - 1 VIEW  COMPARISON:  Same day at 9:30 a.m.  FINDINGS: New left IJ central line, tip at the upper cavoatrial junction. Endotracheal and orogastric tubes remain in good position. There is no complicating pneumothorax or new mediastinal widening.  Normal heart size and aortic contours. Intervally increased density of asymmetric to the left airspace disease.  IMPRESSION: 1. New left IJ central line, tip in good position.  No pneumothorax. 2. Increased pneumonia or noncardiogenic edema.   Electronically Signed   By: Monte Fantasia M.D.   On: 06/04/2014 13:23   Dg Chest Port 1 View  06/04/2014   CLINICAL DATA:  Intubation.  EXAM: PORTABLE CHEST - 1 VIEW  COMPARISON:  None.  FINDINGS: Endotracheal tube noted with tip 2.7 cm above the carina. NG tube noted with tip below left hemidiaphragm. Dense bilateral pulmonary infiltrates again noted. Interim partial clearing of right lower lobe atelectasis. Small right pleural effusion noted. Heart size normal. No pneumothorax. No acute bony abnormality .  IMPRESSION: 1. Interim intubation, endotracheal  tube tip 2.7 cm above the carina. NG tube in stable position. 2. Persistent dense bilateral pulmonary infiltrates and small right pleural effusion. Interim partial clearing of right lower lobe atelectasis.   Electronically Signed   By: Marcello Moores  Register   On: 06/04/2014 09:41   Dg Chest Port 1 View  06/04/2014   CLINICAL DATA:  Shortness of Breath  EXAM: PORTABLE CHEST - 1 VIEW  COMPARISON:  June 02, 2014  FINDINGS: There is now extensive patchy airspace consolidation bilaterally. There is also consolidation in portions of the right middle and lower lobes. The heart size is normal. The pulmonary vascular is normal.  The pneumoperitoneum seen 1 day prior is not appreciable currently. There are surgical clips in the upper abdomen. Nasogastric tube tip and side port are in the stomach.  IMPRESSION: Extensive airspace consolidation bilaterally. Volume loss and significant portions of the right middle and lower lobes. Obstruction of the right bronchus intermedius is questioned. This finding may warrant bronchoscopy. Differential considerations for the current findings include pneumonia, aspiration, or possibly noncardiogenic edema. More than 1 of these entities may exist concurrently. Nasogastric tube tip and side port in stomach.   Electronically Signed   By: Lowella Grip III M.D.   On: 06/04/2014 08:30   Dg Abd Acute W/chest  06/02/2014   CLINICAL DATA:  Abdominal pain, nausea and vomiting, bleeding after colonoscopy, evaluate for free air.  EXAM: ACUTE ABDOMEN SERIES (ABDOMEN 2 VIEW & CHEST 1 VIEW)  COMPARISON:  None.  FINDINGS: Moderate free intra-abdominal air. There is gaseous distention of transverse colon with air-fluid level. Enteric chain sutures noted in the left upper quadrant. Small volume of air throughout normal caliber small bowel loops. Multiple surgical clips in the right upper quadrant of the abdomen. Small air-fluid level in the stomach. Bibasilar atelectasis. Cardiomediastinal contours are  normal. No acute osseous  abnormalities are seen.  IMPRESSION: Moderate free intra-abdominal air. Gaseous distention of transverse colon with air-fluid level.  Critical Value/emergent results were called by telephone at the time of interpretation on 06/02/2014 at 7:33 pm to Dr. Ernestina Patches , who verbally acknowledged these results.   Electronically Signed   By: Jeb Levering M.D.   On: 06/02/2014 19:34      ASSESSMENT AND PLAN: 1. Acute hypoxic respiratory failure-BNP only mildly elevated; LV function normal on echo; Troponin minimally elevated with no clear trend; above suggests this is less likely ischemia mediated; Chest xray on 4/1 showed b/l pulmonary infiltrates c/w ARDS, no pulmonary edema; probable ALI; ?infection; CCM managing; on antibiotics.  2. Mildly elevated troponin in the setting of recent acute bowel perforation and respiratory failure. No clear trend; per Dr Radford Pax, patient denied CP (remains sedated this AM); continue ASA and IV metoprolol. Will need ischemia evaluation (nuclear study) which can be pursued as an outpatient.  3. Bowel perforation s/p emergent laparotomy  4. Remote CAD with history of PCI in 1987: Receiving ASA per NG tube and IV metoprolol. Continue. BP normal to mildly elevated.   Kate Sable, M.D., F.A.C.C.

## 2014-06-07 NOTE — Progress Notes (Signed)
Left upper extremity venous and arterial duplex completed.  Upper extremity venous duplex:  No evidence of DVT or superficial thrombosis in the left upper extremity and right subclavian vein.  Upper extremity arterial duplex:  No evidence of significant plaque or stenosis noted in the left upper extremity and right subclavian artery.

## 2014-06-07 NOTE — Progress Notes (Signed)
ANTIBIOTIC CONSULT NOTE - FOLLOW UP  Pharmacy Consult for Vancomycin, Micafungin Indication: pneumonia, candidiasis  Allergies  Allergen Reactions  . Remicade [Infliximab]     convulsions    Patient Measurements: Height: 5' (152.4 cm) Weight: 122 lb 9.2 oz (55.6 kg) IBW/kg (Calculated) : 45.5  Vital Signs: Temp: 98.2 F (36.8 C) (04/02 0800) Temp Source: Axillary (04/02 0000) BP: 114/51 mmHg (04/02 1100) Pulse Rate: 78 (04/02 1100) Intake/Output from previous day: 04/01 0701 - 04/02 0700 In: 3948.6 [I.V.:3135.2; IV Piggyback:803.3] Out: 1025 [Urine:925; Drains:100] Intake/Output from this shift: Total I/O In: 76.6 [I.V.:76.6] Out: 75 [Urine:75]  Labs:  Recent Labs  06/05/14 0500 06/06/14 0425 06/07/14 0400  WBC 22.2* 15.4* 13.5*  HGB 10.1* 8.9* 8.3*  PLT 363 307 278  CREATININE 0.83 0.86 0.67   Estimated Creatinine Clearance: 59.2 mL/min (by C-G formula based on Cr of 0.67). No results for input(s): VANCOTROUGH, VANCOPEAK, VANCORANDOM, GENTTROUGH, GENTPEAK, GENTRANDOM, TOBRATROUGH, TOBRAPEAK, TOBRARND, AMIKACINPEAK, AMIKACINTROU, AMIKACIN in the last 72 hours.    Assessment: 22 yoF admitted with colonic bowel perforation. S/p ex lap with partial colectomy 3/28. In ICU with postop respiratory failure on 3/30, requiring emergent intubation. CXR showed bilateral infiltrates suggestive of acute pulmonary edema. Patient has been on Zosyn per [facility]. PCCM added vancomycin per pharmacy for possible pneumonia. Note patient is on Humira and prednisone PTA.  Pharmacy is consulted to add micafungin for yeast in respiratory culture   3/28 >> Zosyn >> 3/30 >> Vancomycin >> 4/1 >> micafungin >>  Micro: 3/28 MRSA PCR: negative 3/30 Resp cx: few candida albicans 3/30 blood x2: ngtd 3/30 urine: NGF 3/30 Respiratory virus panel: none detected  Today, 06/07/2014:   Tmax: remains afebrile  WBC: elevated but improved, 13.5 (solucortef)  Renal: SCr 0.67, CrCl~59  ml/min  PCT 12.31 > 9.78 > 5.49  4/2 D6 Zosyn, D4 Vancomycin, D2 micafungin.  Cultures unrevealing except respiratory culture with C. Albicans.  Goal of Therapy:  Vancomycin trough level 15-20 mcg/ml  Appropriate abx dosing, eradication of infection.   Plan:   Continue Micafungin 100mg  IV daily  Continue Zosyn 3.375g IV Q8H infused over 4hrs per MD dosing.  Continue Vancomycin 500mg  IV q12h.  Measure Vanc trough at steady state.  Follow up renal fxn, culture results, and clinical course.   Gretta Arab PharmD, BCPS Pager [phone] 06/07/2014 11:14 AM

## 2014-06-07 NOTE — Progress Notes (Signed)
PULMONARY / CRITICAL CARE MEDICINE   Name: [name] MRN: [id number] DOB: 1955/10/27   PCP Nance Pear., NP   ADMISSION DATE:  06/02/2014 CONSULTATION DATE:  06/04/2014   REFERRING MD :  Dr Leighton Ruff    BRIEF  59 year old femal smoker with hx of MI in the 1980s with s.p stent (details not known), and hx of ICU, septic shock from PNa/cellulitis and prolonged critical illness 7 years ago at [facility] and IBD - immunsupprssed on humira, ? Prednisone and sulfasalazine. Admitted 06/02/2014 with colonic bowel perforation and s/p EXPLORATORY LAPAROTOMY/PARTIAL COLECTOMY WITH RESECTION OF COLOSTOMY And creation of new colostomy on 06/02/14. Had clear cxr at admit. Was doing well post op and then on 06/04/2014 over a matter of few hours develop sudden onset, rapidly progressive acute resp failure needing emergent intubation. Pink frothy sputum noted from ET tube post intubation. CXR showed bilateral alveolar infiltrates suggestive of acute pulmonary edema but final diagnosis 06/05/14 is ARDS   CHIEF COMPLAINT:  Post Op acute resp failure   SIGNIFICANT EVENTS: 06/02/2014 - admit 06/04/2014 - PCCM consult 06/05/14: ARDS NET + Start  NIH ROSE Trial (Re-evaluaton Of Systemic Early NMB) - randomized to sedation only Arm   SUBJECTIVE:  Had to have paralytic started overnight L UE noted to be cool, weak pulses, different BP's in the two arms.    VITAL SIGNS: Temp:  [97 F (36.1 C)-98.6 F (37 C)] 98.2 F (36.8 C) (04/02 0800) Pulse Rate:  [75-88] 78 (04/02 1100) Resp:  [35-52] 35 (04/02 1100) BP: (94-148)/(47-69) 114/51 mmHg (04/02 1100) SpO2:  [87 %-100 %] 95 % (04/02 1100) Arterial Line BP: (100-188)/(39-75) 138/64 mmHg (04/02 1100) FiO2 (%):  [40 %-50 %] 40 % (04/02 1130) HEMODYNAMICS: CVP:  [12 mmHg-23 mmHg] 12 mmHg VENTILATOR SETTINGS: Vent Mode:  [-] PRVC FiO2 (%):  [40 %-50 %] 40 % Set Rate:  [35 bmp] 35 bmp Vt Set:  [280 mL] 280 mL PEEP:  [8 cmH20-16 cmH20] 10  cmH20 Plateau Pressure:  [26 cmH20-32 cmH20] 30 cmH20 INTAKE / OUTPUT:  Intake/Output Summary (Last 24 hours) at 06/07/14 1226 Last data filed at 06/07/14 1200  Gross per 24 hour  Intake 3073.07 ml  Output   1200 ml  Net 1873.07 ml    PHYSICAL EXAMINATION: General:  Sedated on vent Neuro:  Sedated and paralyzed  HEENT:  ET tube + NGT Cardiovascular:  Tachycardic, Normal heart sounds Lungs:  Decreased bs+ Abdomen:  Soft, dressing intact Musculoskeletal:  L arm cool, only able to doppler weak pulse Skin:  Intact anteriorly  LABS:  PULMONARY  Recent Labs Lab 06/05/14 1308 06/05/14 1745 06/05/14 1920 06/06/14 0405 06/07/14 0425  PHART 7.248* 7.289* 7.258* 7.251* 7.245*  PCO2ART 47.1* 42.2 44.7 42.0 47.5*  PO2ART 88.0 87.7 141.0* 145.0* 57.3*  HCO3 19.8* 19.6* 19.3* 17.8* 19.9*  TCO2 19.2 18.8 18.6 17.3 19.3  O2SAT 95.4 95.5 97.7 97.8 85.6    CBC  Recent Labs Lab 06/05/14 0500 06/06/14 0425 06/07/14 0400  HGB 10.1* 8.9* 8.3*  HCT 32.2* 28.4* 26.4*  WBC 22.2* 15.4* 13.5*  PLT 363 307 278    COAGULATION  Recent Labs Lab 06/04/14 1030 06/06/14 0425  INR 1.17 1.00    CARDIAC    Recent Labs Lab 06/04/14 0909 06/04/14 1030 06/04/14 1530 06/04/14 2010 06/05/14 0500  TROPONINI 0.12* 0.18* 0.31* 0.22* 0.14*   No results for input(s): PROBNP in the last 168 hours.   CHEMISTRY  Recent Labs Lab 06/04/14 0335 06/04/14  1030 06/05/14 0500 06/06/14 0425 06/07/14 0400  NA 138 137 133* 137 138  K 3.9 3.6 4.1 5.0 4.1  CL 108 107 105 115* 114*  CO2 24 24 24 19 20   GLUCOSE 109* 103* 143* 153* 120*  BUN 10 9 10 10 9   CREATININE 0.87 0.93 0.83 0.86 0.67  CALCIUM 7.5* 7.2* 6.8* 7.1* 7.1*  MG  --  1.3* 2.4 2.4 2.0  PHOS  --  2.0* 2.3 2.0* 2.2*   Estimated Creatinine Clearance: 59.2 mL/min (by C-G formula based on Cr of 0.67).   LIVER  Recent Labs Lab 06/02/14 1703 06/04/14 1030 06/06/14 0425  AST 24 62*  --   ALT 13 18  --   ALKPHOS 72  50  --   BILITOT 0.3 0.7  --   PROT 7.2 5.3*  --   ALBUMIN 3.8 2.3*  --   INR  --  1.17 1.00     INFECTIOUS  Recent Labs Lab 06/02/14 1712 06/04/14 1030 06/04/14 1040 06/05/14 0500 06/05/14 1358 06/06/14 0425  LATICACIDVEN 1.10  --  1.2  --  1.2  --   PROCALCITON  --  12.31  --  9.78  --  5.49     ENDOCRINE CBG (last 3)   Recent Labs  06/07/14 0214 06/07/14 0359 06/07/14 0752  GLUCAP 123* 106* 110*     IMAGING x48h Dg Chest Port 1 View  06/07/2014   CLINICAL DATA:  J80 (ICD-10-CM) - ARDS (adult respiratory distress syndrome). Hx of heart murmur and myocardial infarction.  EXAM: PORTABLE CHEST - 1 VIEW  COMPARISON:  06/06/2014 and 06/05/2014.  FINDINGS: 0614 hours. The endotracheal tube is 1.7 cm above the carina, not significantly changed. Left IJ central venous catheter and nasogastric tube remain in place. The visualized heart size and mediastinal contours are stable. There is no pneumothorax. Compared with yesterday's examination, there has been slight worsening of the bilateral airspace opacities, especially in the right upper lobe and at the left lung base. There may be a small left pleural effusion.  IMPRESSION: Slight interval worsening of bilateral airspace opacities. Possible small left pleural effusion. Endotracheal tube approximately 1.7 cm above the carina.   Electronically Signed   By: Richardean Sale M.D.   On: 06/07/2014 08:46   Dg Chest Port 1 View  06/06/2014   CLINICAL DATA:  Respiratory failure.  EXAM: PORTABLE CHEST - 1 VIEW  COMPARISON:  06/05/2014 and 06/04/2014 and 06/01/2008  FINDINGS: Endotracheal tube is in good position. Central venous catheter tip is in the superior vena cava 3.8 cm below the carina. Bilateral pulmonary infiltrates have improved on the left and in the right upper lobe but I have worsened at the right base. Heart size and vascularity are normal.  IMPRESSION: Persistent bilateral pulmonary infiltrates consistent with ARDS. Infiltrates  of increased at the right base but improved in the remainder of the lungs.   Electronically Signed   By: Lorriane Shire M.D.   On: 06/06/2014 07:15     ASSESSMENT / PLAN:  PULMONARY OETT  06/04/2014 >>  A: Acute Respiratory failure ARDS  P:   ARDS Net protocol for vent - Vt goal 6cc/kg/IBW - to extent possible keep at 6/cc/kg/IBW; will consider liberalizing Vt if it will allow [place] to stop paralytics ROSE NIH PETAL NETWORK TRIAL - on std of care sedation arm - avoid NMB blockade > will try to stop 4/2  - refer to Page 24 - bedside protocol for precise  criteria and  loop out  - Planned Diuresis per Trial Protocol, see CVS section  CARDIOVASCULAR CVL L IJ 3/30>>  A:  CAD, Hx of MI s/p stents in 1980s during age 66s Shock, presumed septic shock due to peritonitis Cool L UE, still has pulses but concerning for clot or ischemia P:  Vasopressor for MAP > 65  Diuresis >/=  12h after patient off vasopressors   - CVP goal 4 per Trial protocol  - diuresis is nursing driven per [place] bedside flow chart and will be weighted by CVP, MAP and Urine Output Check doppler US of the LUE   RENAL A:  Intact but at risk for AKI P:   Follow BMP Replace electrolytes as indicated  GASTROINTESTINAL A:   Crohn's disease Perforation s/p surgical resection Peritonitis  P:   Appreciate CCS and GI recs Will defer TF until sure that paralysis can be lifted  HEMATOLOGIC A:  AT risk for anemia of critical illness P:  PRBC per ICU guidelines  INFECTIOUS BCx2 >> UC >> Tracheal aspirate for bacteria and virus 06/04/14 >> few candida albicans  A:   Baseline  - Immunocmpromised patient due to humira + prednisone Current   - admit with bowel perf / peritonitis 06/02/14  - pulmonary event -06/04/14 ARDS - trach aspirate with few candida P:   Pan culture as above Zosyn 3/29>> Vanc 3/31 >> Micafungin 06/06/14 (candida in BAL, peritonitis) >>    ENDOCRINE A:  Chronic pred  P:   Stress  dose hydrocort, at risk for adrenal insufficiency SSI  NEUROLOGIC A:   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

## 2014-06-07 NOTE — Progress Notes (Addendum)
eLink Physician-Brief Progress Note Patient Name: [name] DOB: [birth year] MRN: [id number]   Date of Service  06/07/2014  HPI/Events of Note  Nurse requests IV fluid reduced to [facility] per study protocol. Patient had colon perforation and colon resection. She is not on nutrition (TPN or enteral) and will need onging IV fluids.  eICU Interventions  Will cut the IV infusion rate from 100 mL/hour to 50 mL/hour. Defer further cut in IV fluids to those who are familiar with the study.         Jancarlos Thrun Cornelia Copa 06/07/2014, 5:13 AM

## 2014-06-08 ENCOUNTER — Inpatient Hospital Stay (HOSPITAL_COMMUNITY): Payer: Commercial Managed Care - HMO

## 2014-06-08 LAB — GLUCOSE, CAPILLARY
GLUCOSE-CAPILLARY: 102 mg/dL — AB (ref 70–99)
GLUCOSE-CAPILLARY: 112 mg/dL — AB (ref 70–99)
GLUCOSE-CAPILLARY: 117 mg/dL — AB (ref 70–99)
GLUCOSE-CAPILLARY: 122 mg/dL — AB (ref 70–99)
Glucose-Capillary: 102 mg/dL — ABNORMAL HIGH (ref 70–99)
Glucose-Capillary: 96 mg/dL (ref 70–99)
Glucose-Capillary: 115 mg/dL — ABNORMAL HIGH (ref 70–99)

## 2014-06-08 LAB — CBC WITH DIFFERENTIAL/PLATELET
BASOS PCT: 0 % (ref 0–1)
Basophils Absolute: 0 10*3/uL (ref 0.0–0.1)
Eosinophils Absolute: 0 10*3/uL (ref 0.0–0.7)
Eosinophils Relative: 0 % (ref 0–5)
HEMATOCRIT: 26.1 % — AB (ref 36.0–46.0)
Hemoglobin: 8.3 g/dL — ABNORMAL LOW (ref 12.0–15.0)
LYMPHS PCT: 10 % — AB (ref 12–46)
Lymphs Abs: 1.3 10*3/uL (ref 0.7–4.0)
MCH: 30.2 pg (ref 26.0–34.0)
MCHC: 31.8 g/dL (ref 30.0–36.0)
MCV: 94.9 fL (ref 78.0–100.0)
MONOS PCT: 8 % (ref 3–12)
Monocytes Absolute: 1 10*3/uL (ref 0.1–1.0)
NEUTROS ABS: 10.6 10*3/uL — AB (ref 1.7–7.7)
Neutrophils Relative %: 82 % — ABNORMAL HIGH (ref 43–77)
Platelets: 315 10*3/uL (ref 150–400)
RBC: 2.75 MIL/uL — AB (ref 3.87–5.11)
RDW: 13.9 % (ref 11.5–15.5)
WBC: 12.9 10*3/uL — ABNORMAL HIGH (ref 4.0–10.5)

## 2014-06-08 LAB — BASIC METABOLIC PANEL
Anion gap: 8 (ref 5–15)
BUN: 15 mg/dL (ref 6–23)
CHLORIDE: 109 mmol/L (ref 96–112)
CO2: 25 mmol/L (ref 19–32)
Calcium: 7.9 mg/dL — ABNORMAL LOW (ref 8.4–10.5)
Creatinine, Ser: 0.91 mg/dL (ref 0.50–1.10)
GFR, EST AFRICAN AMERICAN: 78 mL/min — AB (ref >90)
GFR, EST NON AFRICAN AMERICAN: 68 mL/min — AB (ref >90)
Glucose, Bld: 107 mg/dL — ABNORMAL HIGH (ref 70–99)
Potassium: 3.7 mmol/L (ref 3.5–5.1)
Sodium: 142 mmol/L (ref 135–145)

## 2014-06-08 LAB — MAGNESIUM: Magnesium: 2 mg/dL (ref 1.5–2.5)

## 2014-06-08 LAB — PHOSPHORUS: PHOSPHORUS: 4.7 mg/dL — AB (ref 2.3–4.6)

## 2014-06-08 MED ORDER — FUROSEMIDE 10 MG/ML IJ SOLN
INTRAMUSCULAR | Status: AC
  Administered 2014-06-08: 20 mg
  Filled 2014-06-08: qty 2

## 2014-06-08 MED ORDER — VITAL AF 1.2 CAL PO LIQD: 1000.0000 mL | ORAL | Status: DC

## 2014-06-08 MED ORDER — JEVITY 1.2 CAL PO LIQD: 1000.0000 mL | ORAL | Status: DC

## 2014-06-08 MED ORDER — PRO-STAT SUGAR FREE PO LIQD
30.0000 mL | Freq: Two times a day (BID) | ORAL | Status: DC
  Administered 2014-06-08 (×2): 30 mL via ORAL
  Administered 2014-06-09: 11:00:00 via ORAL
  Administered 2014-06-09 – 2014-06-13 (×8): 30 mL via ORAL
  Filled 2014-06-08 (×18): qty 30

## 2014-06-08 MED ORDER — HYDROCORTISONE NA SUCCINATE PF 100 MG IJ SOLR
50.0000 mg | Freq: Two times a day (BID) | INTRAMUSCULAR | Status: DC
  Administered 2014-06-08 – 2014-06-11 (×6): 50 mg via INTRAVENOUS
  Filled 2014-06-08 (×6): qty 2

## 2014-06-08 MED ORDER — VITAL AF 1.2 CAL PO LIQD
1000.0000 mL | ORAL | Status: DC
  Administered 2014-06-09 – 2014-06-11 (×2): 1000 mL
  Filled 2014-06-08 (×3): qty 1000

## 2014-06-08 MED ORDER — VITAL AF 1.2 CAL PO LIQD
1000.0000 mL | ORAL | Status: DC
  Filled 2014-06-08: qty 1000

## 2014-06-08 MED ORDER — FUROSEMIDE 10 MG/ML IJ SOLN: 20.0000 mg | Freq: Once | INTRAMUSCULAR | Status: AC

## 2014-06-08 NOTE — Progress Notes (Signed)
6 Days Post-Op partial colectomy and colostomy Subjective: Pt with ARDS on vent, sedated and paralyzed   Objective: Vital signs in last 24 hours: Temp:  [97.6 F (36.4 C)-98.8 F (37.1 C)] 97.6 F (36.4 C) (04/03 0800) Pulse Rate:  [48-87] 60 (04/03 0742) Resp:  [35] 35 (04/03 0742) BP: (114-131)/(42-59) 131/56 mmHg (04/03 0742) SpO2:  [93 %-99 %] 98 % (04/03 0742) Arterial Line BP: (83-177)/(36-74) 137/57 mmHg (04/03 0742) FiO2 (%):  [40 %] 40 % (04/03 0700) Weight:  [58.9 kg (129 lb 13.6 oz)] 58.9 kg (129 lb 13.6 oz) (04/03 0400)   Intake/Output from previous day: 04/02 0701 - 04/03 0700 In: 1897.6 [I.V.:1437.6; IV Piggyback:450] Out: 4000 [Urine:3400; Emesis/NG output:150; Drains:125; Stool:325] Intake/Output this shift: Total I/O In: 1.9 [I.V.:1.9] Out: 185 [Urine:60; Stool:125]   General appearance: sedated GI: soft, non-distended, ostomy viable  Incision: vac in place  Lab Results:   Recent Labs  06/07/14 0400 06/08/14 0513  WBC 13.5* 12.9*  HGB 8.3* 8.3*  HCT 26.4* 26.1*  PLT 278 315   BMET  Recent Labs  06/07/14 0400 06/08/14 0513  NA 138 142  K 4.1 3.7  CL 114* 109  CO2 20 25  GLUCOSE 120* 107*  BUN 9 15  CREATININE 0.67 0.91  CALCIUM 7.1* 7.9*   PT/INR  Recent Labs  06/06/14 0425  LABPROT 13.3  INR 1.00   ABG  Recent Labs  06/06/14 0405 06/07/14 0425  PHART 7.251* 7.245*  HCO3 17.8* 19.9*    MEDS, Scheduled . antiseptic oral rinse  7 mL Mouth Rinse QID  . artificial tears  1 application Both Eyes 3 times per day  . aspirin  81 mg Per NG tube Daily  . chlorhexidine  15 mL Mouth Rinse BID  . heparin subcutaneous  5,000 Units Subcutaneous 3 times per day  . hydrocortisone sod succinate (SOLU-CORTEF) inj  100 mg Intravenous Q12H  . insulin aspart  2-6 Units Subcutaneous 6 times per day  . micafungin [facility]) IV  100 mg Intravenous Daily  . pantoprazole (PROTONIX) IV  40 mg Intravenous Q24H  . piperacillin-tazobactam  (ZOSYN)  IV  3.375 g Intravenous Q8H  . vancomycin  500 mg Intravenous Q12H    Studies/Results: Dg Chest Port 1 View  06/08/2014   CLINICAL DATA:  Acute respiratory failure.  On ventilator.  EXAM: PORTABLE CHEST - 1 VIEW  COMPARISON:  06/07/2014  FINDINGS: Support lines and tubes in appropriate position. Endotracheal tube also remains in appropriate position. No pneumothorax visualized.  Increased lung volumes are noted bilaterally. Mild decrease in right upper lobe atelectasis seen.  Diffuse bilateral airspace disease shows mild improvement in lung bases. Probable small bilateral pleural effusions again noted. Heart size is at the upper lobes normal stable.  IMPRESSION: Improved aeration of both lungs. Diffuse bilateral airspace disease/edema shows decrease in lung bases. Right upper lobe atelectasis has also improved.   Electronically Signed   By: Earle Gell M.D.   On: 06/08/2014 07:41   Dg Chest Port 1 View  06/07/2014   CLINICAL DATA:  J80 (ICD-10-CM) - ARDS (adult respiratory distress syndrome). Hx of heart murmur and myocardial infarction.  EXAM: PORTABLE CHEST - 1 VIEW  COMPARISON:  06/06/2014 and 06/05/2014.  FINDINGS: 0614 hours. The endotracheal tube is 1.7 cm above the carina, not significantly changed. Left IJ central venous catheter and nasogastric tube remain in place. The visualized heart size and mediastinal contours are stable. There is no pneumothorax. Compared with yesterday's examination, there has  been slight worsening of the bilateral airspace opacities, especially in the right upper lobe and at the left lung base. There may be a small left pleural effusion.  IMPRESSION: Slight interval worsening of bilateral airspace opacities. Possible small left pleural effusion. Endotracheal tube approximately 1.7 cm above the carina.   Electronically Signed   By: Richardean Sale M.D.   On: 06/07/2014 08:46    Assessment: s/p Procedure(s): EXPLORATORY LAPAROTOMY/PARTIAL COLECTOMY  COLOSTOMY Patient Active Problem List   Diagnosis Date Noted  . Protein-calorie malnutrition, severe 06/06/2014  . ARDS (adult respiratory distress syndrome) 06/05/2014  . Septic shock 06/05/2014  . Acute respiratory failure with hypoxia 06/04/2014  . Acute pulmonary edema 06/04/2014  . Elevated troponin 06/04/2014  . Crohn's colitis   . Acute respiratory failure with hypoxemia   . Perforation of colon 06/02/2014  . Abdominal pain, acute, left lower quadrant 06/17/2013  . CAD (coronary artery disease) of artery bypass graft 05/17/2013  . Osteoporosis, unspecified 05/17/2013  . RECTAL PAIN 12/01/2009  . COLITIS, ULCERATIVE NOS 07/31/2008  . CORONARY ARTERY DISEASE 05/03/2007  . ANAL FISTULA 05/03/2007  . CROHN'S DISEASE, LARGE INTESTINE 09/26/2005    Pt with ARDS and sepsis after bowel resection for colonic perforation  Plan: CCM does not want to start tube feeds due to paralytics.  Will have PICC inserted and start TPN.     LOS: 6 days     .[name], [facility], [place]   06/08/2014 9:10 AM

## 2014-06-08 NOTE — Progress Notes (Signed)
Elevated trop.       Patient Name: [name] Date of Encounter: 06/09/2014    SUBJECTIVE:Intubated, and not responsive  TELEMETRY:  ?NSR and bradycardia, with regular rhythm. Filed Vitals:   06/09/14 0200 06/09/14 0300 06/09/14 0305 06/09/14 0400  BP:   109/41   Pulse: 59 57 57 59  Temp:    97.7 F (36.5 C)  TempSrc:    Axillary  Resp: 21 28 38 26  Height:      Weight:    131 lb 13.4 oz (59.8 kg)  SpO2: 91% 93% 93% 91%    Intake/Output Summary (Last 24 hours) at 06/09/14 0635 Last data filed at 06/09/14 1610  Gross per 24 hour  Intake 1555.77 ml  Output   2350 ml  Net -794.23 ml   LABS: Basic Metabolic Panel:  Recent Labs  06/08/14 0513 06/09/14 0520  NA 142 144  K 3.7 2.7*  CL 109 108  CO2 25 28  GLUCOSE 107* 145*  BUN 15 26*  CREATININE 0.91 0.82  CALCIUM 7.9* 8.0*  MG 2.0 2.0  PHOS 4.7* 3.4   CBC:  Recent Labs  06/07/14 0400 06/08/14 0513 06/09/14 0520  WBC 13.5* 12.9* 16.0*  NEUTROABS 12.0* 10.6*  --   HGB 8.3* 8.3* 8.0*  HCT 26.4* 26.1* 25.5*  MCV 96.4 94.9 94.1  PLT 278 315 309   BNP    Component Value Date/Time   BNP 230.4* 06/04/2014 1040    ProBNP No results found for: PROBNP   Radiology/Studies:  CXR: Bilat airspace disease  Physical Exam: Blood pressure 109/41, pulse 59, temperature 97.7 F (36.5 C), temperature source Axillary, resp. rate 26, height 5' (1.524 m), weight 131 lb 13.4 oz (59.8 kg), SpO2 91 %. Weight change: 1 lb 15.7 oz (0.9 kg)  Wt Readings from Last 3 Encounters:  06/09/14 131 lb 13.4 oz (59.8 kg)  06/02/14 101 lb (45.813 kg)  05/02/14 101 lb 12.8 oz (46.176 kg)   Frail appearing Rhonchi that clear with suctioning. No pericardial rub  ASSESSMENT:  1. Elevated troponin related to stress (Supply / demand mismatch). 2. CAD with prior PCI, remote 3. Bowel perforation 4. Respiratory failure  Plan:  1. Check ECG. None in EPIC 2. Will follow and help as needed.  Demetrios Isaacs 06/09/2014, 6:35 AM

## 2014-06-08 NOTE — Progress Notes (Signed)
NUTRITION FOLLOW UP  Intervention:   Advance Vital AF 1.2 @ 20 ml/hr via OGT by 10 ml every 4 hours to goal rate of 30 ml/hr.    30 ml Prostat BID  Tube feeding regimen + current Propofol infusion provides 1328 kcal (100% of needs), 84 grams of protein, and 584 ml of H2O.   RD to continue to monitor   Nutrition Dx:   Inadequate oral intake related to inability to eat as evidenced by NPO. ongoing  Goal:   Pt to meet >/= 90% of their estimated energy requirements. Not met  Monitor:   TF regimen & tolerance, respiratory status, weight, labs, I/O's  Assessment:   36 yoF admitted with colonic bowel perforation. S/p ex lap with partial colectomy 3/28. In ICU with postop respiratory failure on 3/30, requiring emergent intubation. CXR showed bilateral infiltrates suggestive of acute pulmonary edema. WBC elevated and patient is febrile. Patient has a long history of Crohn's disease with at least 2 previous partial colectomies and left-sided ostomy.  Consult received to adjust and manage enteral nutrition support.  Patient is currently intubated on ventilator support MV: 9.4 L/min Temp (24hrs), Avg:98.4 F (36.9 C), Min:97.6 F (36.4 C), Max:98.8 F (37.1 C)  Propofol: 10 ml/hr ->provides 264 fat kcal  Labs reviewed:  Elevated Phos  Height: Ht Readings from Last 1 Encounters:  06/04/14 5' (1.524 m)    Weight Status:   Wt Readings from Last 1 Encounters:  06/08/14 129 lb 13.6 oz (58.9 kg)   Body mass index is 25.36 kg/(m^2).  Re-estimated needs:  Kcal: 1318 kcal Protein: 85 - 95g Fluid: per MD  Skin: WDL  Diet Order: Diet NPO time specified Except for: Ice Chips   Intake/Output Summary (Last 24 hours) at 06/08/14 1042 Last data filed at 06/08/14 0900  Gross per 24 hour  Intake 1797.59 ml  Output   4110 ml  Net -2312.41 ml    Last BM: 4/3 (125 ml via ostomy bag)   Labs:   Recent Labs Lab 06/06/14 0425 06/07/14 0400 06/08/14 0513  NA 137 138 142   K 5.0 4.1 3.7  CL 115* 114* 109  CO2 _0 BUN _1 CREATININE 0.86 0.67 0.91  CALCIUM 7.1* 7.1* 7.9*  MG 2.4 2.0 2.0  PHOS 2.0* 2.2* 4.7*  GLUCOSE 153* 120* 107*    CBG (last 3)   Recent Labs  06/07/14 1950 06/08/14 06/08/14 0804  GLUCAP 112* 117* 102*    Scheduled Meds: . antiseptic oral rinse  7 mL Mouth Rinse QID  . aspirin  81 mg Per NG tube Daily  . chlorhexidine  15 mL Mouth Rinse BID  . heparin subcutaneous  5,000 Units Subcutaneous 3 times per day  . hydrocortisone sod succinate (SOLU-CORTEF) inj  100 mg Intravenous Q12H  . insulin aspart  2-6 Units Subcutaneous 6 times per day  . micafungin [facility]) IV  100 mg Intravenous Daily  . pantoprazole (PROTONIX) IV  40 mg Intravenous Q24H  . piperacillin-tazobactam (ZOSYN)  IV  3.375 g Intravenous Q8H  . vancomycin  500 mg Intravenous Q12H    Continuous Infusions: . dextrose 5 % and 0.45% NaCl 10 mL/hr (06/07/14 0929)  . feeding supplement (VITAL AF 1.2 CAL)    . fentaNYL infusion INTRAVENOUS 175 mcg/hr (06/08/14 0958)  . phenylephrine (NEO-SYNEPHRINE) Adult infusion Stopped (06/08/14 0730)  . propofol 30 mcg/kg/min (06/08/14 0957)    Clayton Bibles, MS, RD, LDN Pager: [phone] After Hours Pager: [phone]

## 2014-06-08 NOTE — Progress Notes (Signed)
PULMONARY / CRITICAL CARE MEDICINE   Name: [name] MRN: [id number] DOB: 08-Apr-1955   PCP Nance Pear., NP   ADMISSION DATE:  06/02/2014 CONSULTATION DATE:  06/04/2014   REFERRING MD :  Dr Leighton Ruff    BRIEF  59 year old femal smoker with hx of MI in the 1980s with s.p stent (details not known), and hx of ICU, septic shock from PNa/cellulitis and prolonged critical illness 7 years ago at [facility] and IBD - immunsupprssed on humira, ? Prednisone and sulfasalazine. Admitted 06/02/2014 with colonic bowel perforation and s/p EXPLORATORY LAPAROTOMY/PARTIAL COLECTOMY WITH RESECTION OF COLOSTOMY And creation of new colostomy on 06/02/14. Had clear cxr at admit. Was doing well post op and then on 06/04/2014 over a matter of few hours develop sudden onset, rapidly progressive acute resp failure needing emergent intubation. Pink frothy sputum noted from ET tube post intubation. CXR showed bilateral alveolar infiltrates suggestive of acute pulmonary edema but final diagnosis 06/05/14 is ARDS   CHIEF COMPLAINT:  Post Op acute resp failure   SIGNIFICANT EVENTS: 06/02/2014 - admit 06/04/2014 - PCCM consult 06/05/14: ARDS NET + Start  NIH ROSE Trial (Re-evaluaton Of Systemic Early NMB) - randomized to sedation only Arm  06/08/14 >> LUE arterial and venous dopplers >> negative  SUBJECTIVE:  Paralytic off LUE arterial and venous doppler negative   VITAL SIGNS: Temp:  [97.6 F (36.4 C)-98.8 F (37.1 C)] 97.6 F (36.4 C) (04/03 0800) Pulse Rate:  [48-87] 69 (04/03 0900) Resp:  [22-35] 24 (04/03 0900) BP: (114-131)/(42-56) 131/56 mmHg (04/03 0742) SpO2:  [93 %-99 %] 95 % (04/03 0900) Arterial Line BP: (83-177)/(36-74) 149/58 mmHg (04/03 0900) FiO2 (%):  [40 %] 40 % (04/03 0944) Weight:  [58.9 kg (129 lb 13.6 oz)] 58.9 kg (129 lb 13.6 oz) (04/03 0400) HEMODYNAMICS: CVP:  [10 mmHg-14 mmHg] 14 mmHg VENTILATOR SETTINGS: Vent Mode:  [-] PRVC FiO2 (%):  [40 %] 40 % Set Rate:  [35 bmp] 35  bmp Vt Set:  [280 mL] 280 mL PEEP:  [8 cmH20-10 cmH20] 8 cmH20 Plateau Pressure:  [23 cmH20-32 cmH20] 25 cmH20 INTAKE / OUTPUT:  Intake/Output Summary (Last 24 hours) at 06/08/14 1041 Last data filed at 06/08/14 0900  Gross per 24 hour  Intake 1797.59 ml  Output   4110 ml  Net -2312.41 ml    PHYSICAL EXAMINATION: General:  Sedated on vent Neuro:  Sedated, grimace to stim HEENT:  ET tube + NGT Cardiovascular:  Tachycardic, Normal heart sounds Lungs:  Decreased bs+ Abdomen:  Soft, dressing intact Musculoskeletal:  L arm remains cool, able to doppler pulses Skin:  Intact anteriorly  LABS:  PULMONARY  Recent Labs Lab 06/05/14 1308 06/05/14 1745 06/05/14 1920 06/06/14 0405 06/07/14 0425  PHART 7.248* 7.289* 7.258* 7.251* 7.245*  PCO2ART 47.1* 42.2 44.7 42.0 47.5*  PO2ART 88.0 87.7 141.0* 145.0* 57.3*  HCO3 19.8* 19.6* 19.3* 17.8* 19.9*  TCO2 19.2 18.8 18.6 17.3 19.3  O2SAT 95.4 95.5 97.7 97.8 85.6    CBC  Recent Labs Lab 06/06/14 0425 06/07/14 0400 06/08/14 0513  HGB 8.9* 8.3* 8.3*  HCT 28.4* 26.4* 26.1*  WBC 15.4* 13.5* 12.9*  PLT 307 278 315    COAGULATION  Recent Labs Lab 06/04/14 1030 06/06/14 0425  INR 1.17 1.00    CARDIAC    Recent Labs Lab 06/04/14 0909 06/04/14 1030 06/04/14 1530 06/04/14 2010 06/05/14 0500  TROPONINI 0.12* 0.18* 0.31* 0.22* 0.14*   No results for input(s): PROBNP in the last 168 hours.  CHEMISTRY  Recent Labs Lab 06/04/14 1030 06/05/14 0500 06/06/14 0425 06/07/14 0400 06/08/14 0513  NA 137 133* 137 138 142  K 3.6 4.1 5.0 4.1 3.7  CL 107 105 115* 114* 109  CO2 24 24 19 20 25   GLUCOSE 103* 143* 153* 120* 107*  BUN 9 10 10 9 15   CREATININE 0.93 0.83 0.86 0.67 0.91  CALCIUM 7.2* 6.8* 7.1* 7.1* 7.9*  MG 1.3* 2.4 2.4 2.0 2.0  PHOS 2.0* 2.3 2.0* 2.2* 4.7*   Estimated Creatinine Clearance: 53.5 mL/min (by C-G formula based on Cr of 0.91).   LIVER  Recent Labs Lab 06/02/14 1703 06/04/14 1030  06/06/14 0425  AST 24 62*  --   ALT 13 18  --   ALKPHOS 72 50  --   BILITOT 0.3 0.7  --   PROT 7.2 5.3*  --   ALBUMIN 3.8 2.3*  --   INR  --  1.17 1.00     INFECTIOUS  Recent Labs Lab 06/02/14 1712 06/04/14 1030 06/04/14 1040 06/05/14 0500 06/05/14 1358 06/06/14 0425  LATICACIDVEN 1.10  --  1.2  --  1.2  --   PROCALCITON  --  12.31  --  9.78  --  5.49     ENDOCRINE CBG (last 3)   Recent Labs  06/07/14 1950 06/08/14 06/08/14 0804  GLUCAP 112* 117* 102*     IMAGING x48h Dg Chest Port 1 View  06/08/2014   CLINICAL DATA:  Acute respiratory failure.  On ventilator.  EXAM: PORTABLE CHEST - 1 VIEW  COMPARISON:  06/07/2014  FINDINGS: Support lines and tubes in appropriate position. Endotracheal tube also remains in appropriate position. No pneumothorax visualized.  Increased lung volumes are noted bilaterally. Mild decrease in right upper lobe atelectasis seen.  Diffuse bilateral airspace disease shows mild improvement in lung bases. Probable small bilateral pleural effusions again noted. Heart size is at the upper lobes normal stable.  IMPRESSION: Improved aeration of both lungs. Diffuse bilateral airspace disease/edema shows decrease in lung bases. Right upper lobe atelectasis has also improved.   Electronically Signed   By: Earle Gell M.D.   On: 06/08/2014 07:41   Dg Chest Port 1 View  06/07/2014   CLINICAL DATA:  J80 (ICD-10-CM) - ARDS (adult respiratory distress syndrome). Hx of heart murmur and myocardial infarction.  EXAM: PORTABLE CHEST - 1 VIEW  COMPARISON:  06/06/2014 and 06/05/2014.  FINDINGS: 0614 hours. The endotracheal tube is 1.7 cm above the carina, not significantly changed. Left IJ central venous catheter and nasogastric tube remain in place. The visualized heart size and mediastinal contours are stable. There is no pneumothorax. Compared with yesterday's examination, there has been slight worsening of the bilateral airspace opacities, especially in the right upper  lobe and at the left lung base. There may be a small left pleural effusion.  IMPRESSION: Slight interval worsening of bilateral airspace opacities. Possible small left pleural effusion. Endotracheal tube approximately 1.7 cm above the carina.   Electronically Signed   By: Richardean Sale M.D.   On: 06/07/2014 08:46     ASSESSMENT / PLAN:  PULMONARY OETT  06/04/2014 >>  A: Acute Respiratory failure ARDS P:   ARDS Net protocol for vent - Vt goal 6cc/kg/IBW - to extent possible keep at 6/cc/kg/IBW;  Now back off paralytics and tolerating ROSE NIH PETAL NETWORK TRIAL - on std of care sedation arm - avoid NMB blockade > will try to stop 4/2  - refer to Page 24 - bedside  protocol for precise  criteria and loop out  - Planned Diuresis per Trial Protocol, see CVS section Goal decrease PEEP to 8 on 4/3   CARDIOVASCULAR CVL L IJ 3/30>> A:  CAD, Hx of MI s/p stents in 1980s during age 10s Shock, presumed septic shock due to peritonitis Cool L UE, still has pulses but concerning for clot or ischemia; doppler negative arterial and venous P:  Vasopressors is needed for MAP > 65  Diuresis >/=  12h after patient off vasopressors   - CVP goal 4 per Trial protocol  - diuresis is nursing driven per PETAL Network bedside flow chart and will be weighted by CVP, MAP and Urine Output   RENAL A:  Intact but at risk for AKI P:   Follow BMP Replace electrolytes as indicated  GASTROINTESTINAL A:   Crohn's disease Perforation s/p surgical resection Peritonitis  P:   Appreciate CCS and GI recs Paralytics off, OK from PCCM standpoint to start TF  HEMATOLOGIC A:  AT risk for anemia of critical illness P:  PRBC per ICU guidelines  INFECTIOUS BCx2 >> UC >> negative Tracheal aspirate for bacteria and virus 06/04/14 >> few candida albicans RVP >> negative  A:   Baseline  - Immunocmpromised patient due to humira + prednisone Current   - admit with bowel perf / peritonitis 06/02/14  -  pulmonary event -06/04/14 ARDS - trach aspirate with few candida P:   Pan culture as above Zosyn 3/29>> Vanc 3/31 >> Micafungin 06/06/14 (candida in BAL, peritonitis) >>    ENDOCRINE A:  Chronic pred  P:   Stress dose hydrocort, at risk for adrenal insufficiency; will wean 4/3 SSI  NEUROLOGIC A:   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

## 2014-06-09 ENCOUNTER — Inpatient Hospital Stay (HOSPITAL_COMMUNITY): Payer: Commercial Managed Care - HMO

## 2014-06-09 LAB — CBC
HCT: 25.5 % — ABNORMAL LOW (ref 36.0–46.0)
Hemoglobin: 8 g/dL — ABNORMAL LOW (ref 12.0–15.0)
MCH: 29.5 pg (ref 26.0–34.0)
MCHC: 31.4 g/dL (ref 30.0–36.0)
MCV: 94.1 fL (ref 78.0–100.0)
Platelets: 309 10*3/uL (ref 150–400)
RBC: 2.71 MIL/uL — ABNORMAL LOW (ref 3.87–5.11)
RDW: 13.8 % (ref 11.5–15.5)
WBC: 16 10*3/uL — ABNORMAL HIGH (ref 4.0–10.5)

## 2014-06-09 LAB — GLUCOSE, CAPILLARY
GLUCOSE-CAPILLARY: 100 mg/dL — AB (ref 70–99)
GLUCOSE-CAPILLARY: 127 mg/dL — AB (ref 70–99)
Glucose-Capillary: 112 mg/dL — ABNORMAL HIGH (ref 70–99)
Glucose-Capillary: 114 mg/dL — ABNORMAL HIGH (ref 70–99)
Glucose-Capillary: 119 mg/dL — ABNORMAL HIGH (ref 70–99)
Glucose-Capillary: 126 mg/dL — ABNORMAL HIGH (ref 70–99)
Glucose-Capillary: 137 mg/dL — ABNORMAL HIGH (ref 70–99)

## 2014-06-09 LAB — PHOSPHORUS: Phosphorus: 3.4 mg/dL (ref 2.3–4.6)

## 2014-06-09 LAB — BASIC METABOLIC PANEL
ANION GAP: 8 (ref 5–15)
BUN: 26 mg/dL — ABNORMAL HIGH (ref 6–23)
CALCIUM: 8 mg/dL — AB (ref 8.4–10.5)
CO2: 28 mmol/L (ref 19–32)
CREATININE: 0.82 mg/dL (ref 0.50–1.10)
Chloride: 108 mmol/L (ref 96–112)
GFR calc Af Amer: 89 mL/min — ABNORMAL LOW (ref >90)
GFR calc non Af Amer: 77 mL/min — ABNORMAL LOW (ref >90)
Glucose, Bld: 145 mg/dL — ABNORMAL HIGH (ref 70–99)
Potassium: 2.7 mmol/L — CL (ref 3.5–5.1)
Sodium: 144 mmol/L (ref 135–145)

## 2014-06-09 LAB — MAGNESIUM: Magnesium: 2 mg/dL (ref 1.5–2.5)

## 2014-06-09 MED ORDER — FUROSEMIDE 10 MG/ML IJ SOLN
INTRAMUSCULAR | Status: AC
  Administered 2014-06-09: 20 mg
  Filled 2014-06-09: qty 2

## 2014-06-09 MED ORDER — FUROSEMIDE 10 MG/ML IJ SOLN
20.0000 mg | Freq: Once | INTRAMUSCULAR | Status: AC
  Administered 2014-06-09: 20 mg via INTRAVENOUS

## 2014-06-09 MED ORDER — POTASSIUM CHLORIDE 10 MEQ/50ML IV SOLN
10.0000 meq | INTRAVENOUS | Status: AC
  Administered 2014-06-09 (×4): 10 meq via INTRAVENOUS
  Filled 2014-06-09 (×4): qty 50

## 2014-06-09 MED ORDER — FUROSEMIDE 10 MG/ML IJ SOLN
20.0000 mg | Freq: Once | INTRAMUSCULAR | Status: AC
Start: 2014-06-09 — End: 2014-06-09
  Administered 2014-06-09: 20 mg via INTRAVENOUS

## 2014-06-09 MED ORDER — POTASSIUM CHLORIDE 20 MEQ PO PACK: 40.0000 meq | PACK | Freq: Two times a day (BID) | ORAL | Status: DC

## 2014-06-09 MED ORDER — FUROSEMIDE 10 MG/ML IJ SOLN
INTRAMUSCULAR | Status: AC
Start: 2014-06-09 — End: 2014-06-09
  Filled 2014-06-09: qty 2

## 2014-06-09 MED ORDER — POTASSIUM CHLORIDE CRYS ER 20 MEQ PO TBCR: 40.0000 meq | EXTENDED_RELEASE_TABLET | Freq: Two times a day (BID) | ORAL | Status: DC

## 2014-06-09 MED ORDER — FUROSEMIDE 10 MG/ML IJ SOLN
INTRAMUSCULAR | Status: AC
  Filled 2014-06-09: qty 2

## 2014-06-09 NOTE — Progress Notes (Signed)
PULMONARY / CRITICAL CARE MEDICINE   Name: [name] MRN: [id number] DOB: Nov 21, 1955   PCP Nance Pear., NP   ADMISSION DATE:  06/02/2014 CONSULTATION DATE:  06/04/2014   REFERRING MD :  Dr Leighton Ruff    BRIEF  59 year old femal smoker with hx of MI in the 1980s with s.p stent (details not known), and hx of ICU, septic shock from PNa/cellulitis and prolonged critical illness 7 years ago at [facility] and IBD - immunsupprssed on humira, ? Prednisone and sulfasalazine. Admitted 06/02/2014 with colonic bowel perforation and s/p EXPLORATORY LAPAROTOMY/PARTIAL COLECTOMY WITH RESECTION OF COLOSTOMY And creation of new colostomy on 06/02/14. Had clear cxr at admit. Was doing well post op and then on 06/04/2014 over a matter of few hours develop sudden onset, rapidly progressive acute resp failure needing emergent intubation. Pink frothy sputum noted from ET tube post intubation. CXR showed bilateral alveolar infiltrates suggestive of acute pulmonary edema but final diagnosis 06/05/14 is ARDS   CHIEF COMPLAINT:  Post Op acute resp failure   SIGNIFICANT EVENTS: 06/02/2014 - admit 06/04/2014 - PCCM consult 06/05/14: ARDS NET + Start  NIH ROSE Trial (Re-evaluaton Of Systemic Early NMB) - randomized to sedation only Arm  06/08/14 >> LUE arterial and venous dopplers >> negative  SUBJECTIVE:  FiO2 at 0.50, PEEP at 8 No new issues reported   VITAL SIGNS: Temp:  [97.7 F (36.5 C)-99.3 F (37.4 C)] 98.4 F (36.9 C) (04/04 1145) Pulse Rate:  [56-80] 80 (04/04 1145) Resp:  [19-44] 19 (04/04 1145) BP: (104-197)/(41-65) 197/65 mmHg (04/04 0844) SpO2:  [89 %-99 %] 96 % (04/04 1145) Arterial Line BP: (101-172)/(38-65) 170/63 mmHg (04/04 1145) FiO2 (%):  [40 %-50 %] 50 % (04/04 0844) Weight:  [59.8 kg (131 lb 13.4 oz)] 59.8 kg (131 lb 13.4 oz) (04/04 0400) HEMODYNAMICS: CVP:  [10 mmHg-17 mmHg] 10 mmHg VENTILATOR SETTINGS: Vent Mode:  [-] PRVC FiO2 (%):  [40 %-50 %] 50 % Set Rate:  [35 bmp]  35 bmp Vt Set:  [280 mL] 280 mL PEEP:  [8 cmH20] 8 cmH20 Plateau Pressure:  [16 cmH20-28 cmH20] 16 cmH20 INTAKE / OUTPUT:  Intake/Output Summary (Last 24 hours) at 06/09/14 1153 Last data filed at 06/09/14 1134  Gross per 24 hour  Intake 2542.21 ml  Output   3730 ml  Net -1187.79 ml    PHYSICAL EXAMINATION: General:  Sedated on vent Neuro:  Sedated, grimace to stim HEENT:  ET tube + NGT Cardiovascular:  Tachycardic, Normal heart sounds Lungs:  Decreased bs+ Abdomen:  Soft, dressing intact Musculoskeletal:  L arm remains cool, able to doppler pulses Skin:  Intact anteriorly  LABS:  PULMONARY  Recent Labs Lab 06/05/14 1308 06/05/14 1745 06/05/14 1920 06/06/14 0405 06/07/14 0425  PHART 7.248* 7.289* 7.258* 7.251* 7.245*  PCO2ART 47.1* 42.2 44.7 42.0 47.5*  PO2ART 88.0 87.7 141.0* 145.0* 57.3*  HCO3 19.8* 19.6* 19.3* 17.8* 19.9*  TCO2 19.2 18.8 18.6 17.3 19.3  O2SAT 95.4 95.5 97.7 97.8 85.6    CBC  Recent Labs Lab 06/07/14 0400 06/08/14 0513 06/09/14 0520  HGB 8.3* 8.3* 8.0*  HCT 26.4* 26.1* 25.5*  WBC 13.5* 12.9* 16.0*  PLT 278 315 309    COAGULATION  Recent Labs Lab 06/04/14 1030 06/06/14 0425  INR 1.17 1.00    CARDIAC    Recent Labs Lab 06/04/14 0909 06/04/14 1030 06/04/14 1530 06/04/14 2010 06/05/14 0500  TROPONINI 0.12* 0.18* 0.31* 0.22* 0.14*   No results for input(s): PROBNP in the last  168 hours.   CHEMISTRY  Recent Labs Lab 06/05/14 0500 06/06/14 0425 06/07/14 0400 06/08/14 0513 06/09/14 0520  NA 133* 137 138 142 144  K 4.1 5.0 4.1 3.7 2.7*  CL 105 115* 114* 109 108  CO2 24 19 20 25 28   GLUCOSE 143* 153* 120* 107* 145*  BUN 10 10 9 15  26*  CREATININE 0.83 0.86 0.67 0.91 0.82  CALCIUM 6.8* 7.1* 7.1* 7.9* 8.0*  MG 2.4 2.4 2.0 2.0 2.0  PHOS 2.3 2.0* 2.2* 4.7* 3.4   Estimated Creatinine Clearance: 59.7 mL/min (by C-G formula based on Cr of 0.82).   LIVER  Recent Labs Lab 06/02/14 1703 06/04/14 1030  06/06/14 0425  AST 24 62*  --   ALT 13 18  --   ALKPHOS 72 50  --   BILITOT 0.3 0.7  --   PROT 7.2 5.3*  --   ALBUMIN 3.8 2.3*  --   INR  --  1.17 1.00     INFECTIOUS  Recent Labs Lab 06/02/14 1712 06/04/14 1030 06/04/14 1040 06/05/14 0500 06/05/14 1358 06/06/14 0425  LATICACIDVEN 1.10  --  1.2  --  1.2  --   PROCALCITON  --  12.31  --  9.78  --  5.49     ENDOCRINE CBG (last 3)   Recent Labs  06/08/14 2347 06/09/14 0303 06/09/14 0831  GLUCAP 114* 137* 112*     IMAGING x48h Dg Chest Port 1 View  06/09/2014   CLINICAL DATA:  Respiratory failure.  EXAM: PORTABLE CHEST - 1 VIEW  COMPARISON:  07/05/2014.  FINDINGS: Endotracheal tube, NG tube, left IJ line in stable position. Heart size stable. Persistent bilateral diffuse airspace disease noted. No change. Small left pleural effusion cannot be excluded. No pneumothorax.  IMPRESSION: 1. Lines and tubes in stable position. 2. Persistent bilateral diffuse airspace disease, no interim change. Small left pleural effusion cannot be excluded.   Electronically Signed   By: [place]   On: 06/09/2014 07:12   Dg Chest Port 1 View  06/08/2014   CLINICAL DATA:  Acute respiratory failure.  On ventilator.  EXAM: PORTABLE CHEST - 1 VIEW  COMPARISON:  06/07/2014  FINDINGS: Support lines and tubes in appropriate position. Endotracheal tube also remains in appropriate position. No pneumothorax visualized.  Increased lung volumes are noted bilaterally. Mild decrease in right upper lobe atelectasis seen.  Diffuse bilateral airspace disease shows mild improvement in lung bases. Probable small bilateral pleural effusions again noted. Heart size is at the upper lobes normal stable.  IMPRESSION: Improved aeration of both lungs. Diffuse bilateral airspace disease/edema shows decrease in lung bases. Right upper lobe atelectasis has also improved.   Electronically Signed   By: Earle Gell M.D.   On: 06/08/2014 07:41     ASSESSMENT /  PLAN:  PULMONARY OETT  06/04/2014 >>  A: Acute Respiratory failure ARDS P:   ARDS Net protocol for vent - Vt goal 6cc/kg/IBW - to extent possible keep at 6/cc/kg/IBW;  Now back off paralytics and tolerating ROSE NIH PETAL NETWORK TRIAL - on std of care sedation arm - avoid NMB blockade > will try to stop 4/2  - refer to Page 24 - bedside protocol for precise  criteria and loop out  - Planned Diuresis per Trial Protocol, see CVS section Continue to wean PEEP and FiO2   CARDIOVASCULAR CVL L IJ 3/30>> A:  CAD, Hx of MI s/p stents in 1980s during age 8s Shock, presumed septic shock due to  peritonitis Cool L UE, still has pulses but concerning for clot or ischemia; doppler negative arterial and venous P:  Vasopressors is needed for MAP > 65  Diuresis >/=  12h after patient off vasopressors   - CVP goal 4 per Trial protocol  - diuresis is nursing driven per [facility] Network bedside flow chart and will be weighted by CVP, MAP and Urine Output   RENAL A:  Intact but at risk for AKI Hypokalemia in setting diuretics P:   Follow BMP Replace electrolytes as indicated  GASTROINTESTINAL A:   Crohn's disease Perforation s/p surgical resection Peritonitis  P:   Appreciate CCS and GI recs Paralytics off, on TF  HEMATOLOGIC A:  AT risk for anemia of critical illness P:  PRBC per ICU guidelines  INFECTIOUS BCx2 >> UC >> negative Tracheal aspirate for bacteria and virus 06/04/14 >> few candida albicans RVP >> negative  A:   Baseline  - Immunocmpromised patient due to humira + prednisone Current   - admit with bowel perf / peritonitis 06/02/14  - pulmonary event -06/04/14 ARDS - trach aspirate with few candida P:   Pan culture as above Zosyn 3/29>> Vanc 3/31 >> Micafungin 06/06/14 (candida in BAL, peritonitis) >>    ENDOCRINE A:  Chronic pred for Crohn's P:   Stress dose hydrocort, at risk for adrenal insufficiency; decreased 4/3 SSI  NEUROLOGIC A:   Hx of ICU induced  "coma" per daughter 7years ago ARDS NET sedation - ROSE TRIAL - randomized 06/05/14 to sedation only arm  - Changed RASS goal is -3 per Trial protocol with vent synchrony P:   changed RASS goal to -3 to -2  FAMILY  - Updates: reviewed status with pt's daughter at bedside today 4/4.    Independent CC time 35 minutes   Baltazar Apo, MD, PhD 06/09/2014, 11:53 AM [facility] [phone] or if no answer [phone]

## 2014-06-09 NOTE — Progress Notes (Signed)
CRITICAL VALUE ALERT  Critical value received:  K 2.7  Date of notification:  06/09/14  Time of notification:  0603  Critical value read back:Yes.    Nurse who received alert:  Jari Favre RN  MD notified (1st page):  E-Link  Time of first page:  0605  MD notified (2nd page):  Time of second page:  Responding MD:  Dr Nelda Marseille  Time MD responded:  [phone]

## 2014-06-09 NOTE — Care Management Note (Signed)
CARE MANAGEMENT NOTE 06/09/2014  Patient:  [name], [name]   Account Number:  [id number]  Date Initiated:  06/06/2014  Documentation initiated by:  Jasemine Nawaz  Subjective/Objective Assessment:   had outpt colonoscopy sustained perforated bowel and was taken to or for colostomy has hx of Crohn disease     Action/Plan:   home when stable   Anticipated DC Date:  06/12/2014   Anticipated DC Plan:  HOME/SELF CARE  In-house referral  NA      DC Planning Services  CM consult      PAC Choice  NA   Choice offered to / List presented to:  NA           Status of service:  In process, will continue to follow Medicare Important Message given?   (If response is "NO", the following Medicare IM given date fields will be blank) Date Medicare IM given:   Medicare IM given by:   Date Additional Medicare IM given:   Additional Medicare IM given by:    Discharge Disposition:    Per UR Regulation:  Reviewed for med. necessity/level of care/duration of stay  If discussed at [place] of Stay Meetings, dates discussed:    Comments:  [id number]/[name] Rosana Hoes RN, BSN, CCN: [phone] Case management. Chart reviewed for discharge planning and present needs. Discharge needs: none present at time of review Remains on full vent support/ Iv nimbex per the ARDS Protocol study.  [id number] Rosana Hoes RN, BSN, CCN: [phone] Case management. Chart reviewed for discharge planning and present needs. Discharge needs: none present at time of review.   June 03, 2014/Carlis Blanchard L. Rosana Hoes, RN, BSN, CCM. Case Management [place] [phone] No discharge needs present of time of review.

## 2014-06-09 NOTE — Progress Notes (Signed)
Per Marzetta Board RN with ROSE trail, Furosemide was not given due to patient's critical low potassium. Pt would be reassessed after the firstt 2 runs of potassium.

## 2014-06-09 NOTE — Progress Notes (Signed)
NUTRITION FOLLOW UP  Intervention:   Continue Vital AF 1.2 @ 30 ml/hr.    30 ml Prostat BID  Tube feeding regimen + current Propofol infusion provides 1484 kcal (100% of estimated needs), 84 grams of protein, and 584 ml of H2O.   RD to continue to monitor   Nutrition Dx:   Inadequate oral intake related to inability to eat as evidenced by NPO. ongoing  Goal:   Pt to meet >/= 90% of their estimated energy requirements. Met with TF and calories from propofol  Monitor:   TF regimen & tolerance, respiratory status, weight, labs, I/O's  Assessment:   [name] admitted with colonic bowel perforation. S/p ex lap with partial colectomy 3/28. In ICU with postop respiratory failure on 3/30, requiring emergent intubation. CXR showed bilateral infiltrates suggestive of acute pulmonary edema. WBC elevated and patient is febrile. Patient has a long history of Crohn's disease with at least 2 previous partial colectomies and left-sided ostomy.  - Per RN, pt tolerating TF at goal rate with no residuals.  - Pt with wound vac  Patient is currently intubated on ventilator support MV: 10.2 L/min Temp (24hrs), Avg:98.4 F (36.9 C), Min:97.7 F (36.5 C), Max:99.3 F (37.4 C)  Propofol: 15.9 ml/hr providing additional 420 kcal  Labs reviewed:  K low 2.7 BUN elevated 26 Phos and Mg WNL  Height: Ht Readings from Last 1 Encounters:  06/04/14 5' (1.524 m)    Weight Status:   Wt Readings from Last 1 Encounters:  06/09/14 131 lb 13.4 oz (59.8 kg)   Body mass index is 25.75 kg/(m^2).  Re-estimated needs:  Kcal: 1350-1500 kcal Protein: 85 - 95g  Fluid: per MD  Skin: incision on abdomen- wound vac in place  Diet Order: Diet NPO time specified   Intake/Output Summary (Last 24 hours) at 06/09/14 1423 Last data filed at 06/09/14 1300  Gross per 24 hour  Intake 2522.45 ml  Output   4765 ml  Net -2242.55 ml    Last BM: 4/4 (200 ml via ostomy bag)   Labs:   Recent Labs Lab  06/07/14 0400 06/08/14 0513 06/09/14 0520  NA 138 142 144  K 4.1 3.7 2.7*  CL 114* 109 108  CO2 $Re'20 25 28  'jQp$ BUN 9 15 26*  CREATININE 0.67 0.91 0.82  CALCIUM 7.1* 7.9* 8.0*  MG 2.0 2.0 2.0  PHOS 2.2* 4.7* 3.4  GLUCOSE 120* 107* 145*    CBG (last 3)   Recent Labs  06/09/14 0303 06/09/14 0831 06/09/14 1232  GLUCAP 137* 112* 127*    Scheduled Meds: . antiseptic oral rinse  7 mL Mouth Rinse QID  . aspirin  81 mg Per NG tube Daily  . chlorhexidine  15 mL Mouth Rinse BID  . feeding supplement (PRO-STAT SUGAR FREE 64)  30 mL Oral BID  . furosemide      . heparin subcutaneous  5,000 Units Subcutaneous 3 times per day  . hydrocortisone sod succinate (SOLU-CORTEF) inj  50 mg Intravenous Q12H  . insulin aspart  2-6 Units Subcutaneous 6 times per day  . micafungin [facility]) IV  100 mg Intravenous Daily  . pantoprazole (PROTONIX) IV  40 mg Intravenous Q24H  . piperacillin-tazobactam (ZOSYN)  IV  3.375 g Intravenous Q8H  . potassium chloride  10 mEq Intravenous Q1 Hr x 4  . potassium chloride  40 mEq Oral BID  . vancomycin  500 mg Intravenous Q12H    Continuous Infusions: . dextrose 5 % and 0.45%  NaCl 10 mL/hr (06/07/14 0929)  . feeding supplement (VITAL AF 1.2 CAL) 1,000 mL (06/09/14 0300)  . fentaNYL infusion INTRAVENOUS 200 mcg/hr (06/09/14 0856)  . propofol 50 mcg/kg/min (06/09/14 0855)    [name], [place], [place]

## 2014-06-09 NOTE — Progress Notes (Signed)
7 Days Post-Op  Subjective: Intubated and sedated on vent  Objective: Vital signs in last 24 hours: Temp:  [97.7 F (36.5 C)-99.3 F (37.4 C)] 98.5 F (36.9 C) (04/04 0800) Pulse Rate:  [56-79] 79 (04/04 0900) Resp:  [25-44] 25 (04/04 0900) BP: (104-197)/(41-65) 197/65 mmHg (04/04 0844) SpO2:  [89 %-99 %] 94 % (04/04 0900) Arterial Line BP: (101-172)/(38-65) 172/65 mmHg (04/04 0900) FiO2 (%):  [40 %-50 %] 50 % (04/04 0844) Weight:  [59.8 kg (131 lb 13.4 oz)] 59.8 kg (131 lb 13.4 oz) (04/04 0400) Last BM Date: 06/09/14 (colostomy)  Intake/Output from previous day: 04/03 0701 - 04/04 0700 In: 2113.1 [I.V.:1095.6; NG/GT:455; IV Piggyback:562.5] Out: 2350 [Urine:1825; Drains:150; Stool:375] Intake/Output this shift: Total I/O In: 382.5 [I.V.:222.5; NG/GT:60; IV Piggyback:100] Out: 700 [Urine:700]  Resp: diminished breath sounds bilaterally Cardio: regular rate and rhythm GI: soft, nontender, nondistended. vac in place. tolerating tube feeds  Lab Results:   Recent Labs  06/08/14 0513 06/09/14 0520  WBC 12.9* 16.0*  HGB 8.3* 8.0*  HCT 26.1* 25.5*  PLT 315 309   BMET  Recent Labs  06/08/14 0513 06/09/14 0520  NA 142 144  K 3.7 2.7*  CL 109 108  CO2 25 28  GLUCOSE 107* 145*  BUN 15 26*  CREATININE 0.91 0.82  CALCIUM 7.9* 8.0*   PT/INR No results for input(s): LABPROT, INR in the last 72 hours. ABG  Recent Labs  06/07/14 0425  PHART 7.245*  HCO3 19.9*    Studies/Results: Dg Chest Port 1 View  06/09/2014   CLINICAL DATA:  Respiratory failure.  EXAM: PORTABLE CHEST - 1 VIEW  COMPARISON:  07/05/2014.  FINDINGS: Endotracheal tube, NG tube, left IJ line in stable position. Heart size stable. Persistent bilateral diffuse airspace disease noted. No change. Small left pleural effusion cannot be excluded. No pneumothorax.  IMPRESSION: 1. Lines and tubes in stable position. 2. Persistent bilateral diffuse airspace disease, no interim change. Small left pleural  effusion cannot be excluded.   Electronically Signed   By: [place]   On: 06/09/2014 07:12   Dg Chest Port 1 View  06/08/2014   CLINICAL DATA:  Acute respiratory failure.  On ventilator.  EXAM: PORTABLE CHEST - 1 VIEW  COMPARISON:  06/07/2014  FINDINGS: Support lines and tubes in appropriate position. Endotracheal tube also remains in appropriate position. No pneumothorax visualized.  Increased lung volumes are noted bilaterally. Mild decrease in right upper lobe atelectasis seen.  Diffuse bilateral airspace disease shows mild improvement in lung bases. Probable small bilateral pleural effusions again noted. Heart size is at the upper lobes normal stable.  IMPRESSION: Improved aeration of both lungs. Diffuse bilateral airspace disease/edema shows decrease in lung bases. Right upper lobe atelectasis has also improved.   Electronically Signed   By: Earle Gell M.D.   On: 06/08/2014 07:41    Anti-infectives: Anti-infectives    Start     Dose/Rate Route Frequency Ordered Stop   06/06/14 1200  micafungin (MYCAMINE) 100 mg in sodium chloride 0.9 % 100 mL IVPB     100 mg 100 mL/hr over 1 Hours Intravenous Daily 06/06/14 0944     06/05/14 1000  vancomycin (VANCOCIN) 500 mg in sodium chloride 0.9 % 100 mL IVPB     500 mg 100 mL/hr over 60 Minutes Intravenous Every 12 hours 06/05/14 0839     06/04/14 1400  vancomycin (VANCOCIN) IVPB 1000 mg/200 mL premix  Status:  Discontinued     1,000 mg 200 mL/hr over  60 Minutes Intravenous Every 24 hours 06/04/14 1313 06/05/14 0839   06/03/14 0200  piperacillin-tazobactam (ZOSYN) IVPB 3.375 g     3.375 g 12.5 mL/hr over 240 Minutes Intravenous Every 8 hours 06/02/14 2346     06/02/14 1845  piperacillin-tazobactam (ZOSYN) IVPB 3.375 g     3.375 g 12.5 mL/hr over 240 Minutes Intravenous  Once 06/02/14 1833 06/02/14 2257      Assessment/Plan: s/p Procedure(s): EXPLORATORY LAPAROTOMY/PARTIAL COLECTOMY COLOSTOMY (N/A) Continue tube feeds for protein calorie  malnutrition  Continue antibiotics Vent and critical care per CCM Elevated trop per Cards  LOS: 7 days    TOTH III,PAUL S 06/09/2014

## 2014-06-09 NOTE — Consult Note (Signed)
WOC ostomy follow up Stoma type/location: LLQ Colostomy Stomal assessment/size: 1 and 3/4 inches round.  Os drifting downward toward 6 o'clock.  Stoma still edematous. Circumferential gulley. Peristomal assessment: intact. Treatment options for stomal/peristomal skin: skin barrier ring Output brown thin effluent Ostomy pouching: 2pc., 2 and 1/4 inch pouching system with skin barrier ring. Education provided: None.  Patient sedated and on vent.  No family in room. Enrolled patient in [place] Start Discharge program: No  WOC wound follow up Wound type:midline surgical insicion Measurement:14cm x 2cm x 3.5cm Wound IRJ:JOAC, pink moist Drainage (amount, consistency, odor) serous Periwound:intact, clear Dressing procedure/placement/frequency:NPWT placed after cleansing with NS.  Mepitel as a wound contact layer.  1 piece of black foam used.  AN immediate seal is achieved.  148mmHg negative continuous pressure tolerated well.  Bilateral heels are intact, but at risk.  Prevalon Boots ordered today. Bedside RN may change dressing with WOC Nurses seeing once or twice per week.  [place] nursing team will follow, and will remain available to this patient, the nursing, surgical and medical teams.   Thanks, Maudie Flakes, MSN, RN, [place], [place], [place] [phone])

## 2014-06-10 ENCOUNTER — Inpatient Hospital Stay (HOSPITAL_COMMUNITY): Payer: Commercial Managed Care - HMO

## 2014-06-10 LAB — CULTURE, BLOOD (ROUTINE X 2)
Culture: NO GROWTH
Culture: NO GROWTH

## 2014-06-10 LAB — GLUCOSE, CAPILLARY
Glucose-Capillary: 108 mg/dL — ABNORMAL HIGH (ref 70–99)
Glucose-Capillary: 115 mg/dL — ABNORMAL HIGH (ref 70–99)
Glucose-Capillary: 130 mg/dL — ABNORMAL HIGH (ref 70–99)
Glucose-Capillary: 135 mg/dL — ABNORMAL HIGH (ref 70–99)
Glucose-Capillary: 143 mg/dL — ABNORMAL HIGH (ref 70–99)
Glucose-Capillary: 118 mg/dL — ABNORMAL HIGH (ref 70–99)

## 2014-06-10 LAB — BASIC METABOLIC PANEL
Anion gap: 12 (ref 5–15)
Anion gap: 9 (ref 5–15)
BUN: 28 mg/dL — AB (ref 6–23)
BUN: 27 mg/dL — ABNORMAL HIGH (ref 6–23)
CHLORIDE: 95 mmol/L — AB (ref 96–112)
CO2: 39 mmol/L — ABNORMAL HIGH (ref 19–32)
CO2: 46 mmol/L — AB (ref 19–32)
CREATININE: 0.82 mg/dL (ref 0.50–1.10)
Calcium: 7.9 mg/dL — ABNORMAL LOW (ref 8.4–10.5)
Calcium: 7.6 mg/dL — ABNORMAL LOW (ref 8.4–10.5)
Chloride: 92 mmol/L — ABNORMAL LOW (ref 96–112)
Creatinine, Ser: 0.79 mg/dL (ref 0.50–1.10)
GFR calc Af Amer: 89 mL/min — ABNORMAL LOW (ref >90)
GFR calc Af Amer: >90 mL/min (ref >90)
GFR, EST NON AFRICAN AMERICAN: 77 mL/min — AB (ref >90)
GFR, EST NON AFRICAN AMERICAN: 89 mL/min — AB (ref >90)
GLUCOSE: 126 mg/dL — AB (ref 70–99)
GLUCOSE: 137 mg/dL — AB (ref 70–99)
Potassium: 2.1 mmol/L — CL (ref 3.5–5.1)
Potassium: 2.5 mmol/L — CL (ref 3.5–5.1)
SODIUM: 147 mmol/L — AB (ref 135–145)
Sodium: 146 mmol/L — ABNORMAL HIGH (ref 135–145)

## 2014-06-10 LAB — PHOSPHORUS: PHOSPHORUS: 3.5 mg/dL (ref 2.3–4.6)

## 2014-06-10 LAB — MAGNESIUM: Magnesium: 1.6 mg/dL (ref 1.5–2.5)

## 2014-06-10 LAB — CBC
HEMATOCRIT: 27.8 % — AB (ref 36.0–46.0)
HEMOGLOBIN: 8.9 g/dL — AB (ref 12.0–15.0)
MCH: 29.7 pg (ref 26.0–34.0)
MCHC: 32 g/dL (ref 30.0–36.0)
MCV: 92.7 fL (ref 78.0–100.0)
Platelets: 393 10*3/uL (ref 150–400)
RBC: 3 MIL/uL — ABNORMAL LOW (ref 3.87–5.11)
RDW: 13.8 % (ref 11.5–15.5)
WBC: 16.1 10*3/uL — ABNORMAL HIGH (ref 4.0–10.5)

## 2014-06-10 MED ORDER — FUROSEMIDE 10 MG/ML IJ SOLN
INTRAMUSCULAR | Status: AC
  Administered 2014-06-10: 20 mg
  Filled 2014-06-10: qty 2

## 2014-06-10 MED ORDER — MAGNESIUM SULFATE 2 GM/50ML IV SOLN
2.0000 g | Freq: Once | INTRAVENOUS | Status: AC
  Administered 2014-06-10: 2 g via INTRAVENOUS
  Filled 2014-06-10: qty 50

## 2014-06-10 MED ORDER — FUROSEMIDE 10 MG/ML IJ SOLN
20.0000 mg | Freq: Once | INTRAMUSCULAR | Status: AC
  Administered 2014-06-10: 20 mg via INTRAVENOUS

## 2014-06-10 MED ORDER — FUROSEMIDE 10 MG/ML IJ SOLN
80.0000 mg | Freq: Once | INTRAMUSCULAR | Status: AC
  Administered 2014-06-10: 80 mg via INTRAVENOUS
  Filled 2014-06-10: qty 8

## 2014-06-10 MED ORDER — POTASSIUM CHLORIDE 10 MEQ/50ML IV SOLN
10.0000 meq | INTRAVENOUS | Status: AC
  Administered 2014-06-10 – 2014-06-11 (×6): 10 meq via INTRAVENOUS
  Filled 2014-06-10 (×6): qty 50

## 2014-06-10 MED ORDER — POTASSIUM CHLORIDE 10 MEQ/50ML IV SOLN
10.0000 meq | INTRAVENOUS | Status: AC
  Administered 2014-06-10 (×6): 10 meq via INTRAVENOUS
  Filled 2014-06-10 (×4): qty 50

## 2014-06-10 MED ORDER — POTASSIUM CHLORIDE 20 MEQ/15ML (10%) PO SOLN
40.0000 meq | Freq: Two times a day (BID) | ORAL | Status: DC
  Administered 2014-06-10 – 2014-06-14 (×10): 40 meq
  Filled 2014-06-10 (×10): qty 30

## 2014-06-10 MED ORDER — ACETAMINOPHEN 160 MG/5ML PO SOLN
650.0000 mg | Freq: Four times a day (QID) | ORAL | Status: DC | PRN
  Administered 2014-06-13 – 2014-06-17 (×3): 650 mg
  Filled 2014-06-10 (×4): qty 20.3

## 2014-06-10 MED ORDER — FUROSEMIDE 10 MG/ML IJ SOLN
40.0000 mg | Freq: Once | INTRAMUSCULAR | Status: AC
  Administered 2014-06-10: 40 mg via INTRAVENOUS
  Filled 2014-06-10: qty 4

## 2014-06-10 MED ORDER — POTASSIUM CHLORIDE 20 MEQ/15ML (10%) PO SOLN: 40.0000 meq | Freq: Two times a day (BID) | ORAL | Status: DC

## 2014-06-10 MED ORDER — FUROSEMIDE 10 MG/ML IJ SOLN
20.0000 mg | Freq: Once | INTRAMUSCULAR | Status: AC
Start: 2014-06-10 — End: 2014-06-10
  Administered 2014-06-10: 20 mg via INTRAVENOUS
  Filled 2014-06-10: qty 2

## 2014-06-10 NOTE — Progress Notes (Signed)
eLink Physician-Brief Progress Note Patient Name: YALANDA SODERMAN DOB: 06/19/1955 MRN: [id number]   Date of Service  06/10/2014  HPI/Events of Note    eICU Interventions  Hypokalemia -repleted      Intervention Category Intermediate Interventions: Electrolyte abnormality - evaluation and management  ALVA,RAKESH V. 06/10/2014, 6:32 PM

## 2014-06-10 NOTE — Progress Notes (Signed)
CRITICAL VALUE ALERT  Critical value received:  K+ 2.1  Date of notification:  06/10/14  Time of notification:  04:15  Critical value read back:Yes.    Nurse who received alert:  Lenox Ahr  MD notified (1st page):  Dr. Jimmy Footman  Time of first page:  04:15  MD notified (2nd page): N/A  Time of second page: N/A  Responding MD: Dr. Jimmy Footman   Time MD responded:  04:15

## 2014-06-10 NOTE — Progress Notes (Signed)
eLink Physician-Brief Progress Note Patient Name: [name] DOB: May 07, 1955 MRN: [id number]   Date of Service  06/10/2014  HPI/Events of Note  Hypokalemia and moderately low mag  eICU Interventions  Potassium and Mag replaced     Intervention Category Intermediate Interventions: Electrolyte abnormality - evaluation and management  Chinonso Linker 06/10/2014, 4:39 AM

## 2014-06-10 NOTE — Progress Notes (Signed)
PULMONARY / CRITICAL CARE MEDICINE   Name: [name] MRN: [id number] DOB: April 02, 1955   PCP Nance Pear., NP   ADMISSION DATE:  06/02/2014 CONSULTATION DATE:  06/04/2014   REFERRING MD :  Dr Leighton Ruff  BRIEF SUMMARY:  59 y/o F, smoker, with hx of MI in the 1980s with s.p stent (details not known), prolonged ICU stay for septic shock from PNA/cellulitis (2009) at [facility] and IBD immunsupprssed on humira, ? Prednisone and sulfasalazine. Admitted 3/28 with colonic bowel perforation and s/p ex-lap with partial colectomy / resection of colostomy & creation of new colostomy 3/28.  On 3/30, she had sudden respiratory decompensation requiring emergent intubation.  Clear CXR on admit.  Post intubation CXR c/w pulmonary edema.  Progressed to ARDS 3/31.     SIGNIFICANT EVENTS: 3/28  Admit 3/30  PCCM consult for abrupt resp decline 3/31  ARDS NET + Start  NIH ROSE Trial (Re-evaluaton Of Systemic Early NMB) - randomized to sedation only Arm  4/03  LUE arterial and venous dopplers >> negative 4/05  Weaned to 0.40, PEEP 8  SUBJECTIVE:  FiO2 at 0.40, PEEP at 8, RN reports WUA tolerated / alert but BP went up.  No acute issues.    VITAL SIGNS: Temp:  [97.7 F (36.5 C)-100.9 F (38.3 C)] 97.7 F (36.5 C) (04/05 0400) Pulse Rate:  [66-93] 77 (04/05 0600) Resp:  [19-45] 27 (04/05 0600) BP: (136-196)/(47-77) 168/57 mmHg (04/05 0508) SpO2:  [88 %-97 %] 94 % (04/05 0600) Arterial Line BP: (106-191)/(39-75) 161/59 mmHg (04/05 0600) FiO2 (%):  [40 %-50 %] 40 % (04/05 0508) Weight:  [114 lb 6.7 oz (51.9 kg)] 114 lb 6.7 oz (51.9 kg) (04/05 0315)   HEMODYNAMICS: CVP:  [10 mmHg-13 mmHg] 11 mmHg   VENTILATOR SETTINGS: Vent Mode:  [-] PRVC FiO2 (%):  [40 %-50 %] 40 % Set Rate:  [35 bmp] 35 bmp Vt Set:  [280 mL] 280 mL PEEP:  [5 cmH20-8 cmH20] 8 cmH20 Plateau Pressure:  [20 cmH20] 20 cmH20   INTAKE / OUTPUT:  Intake/Output Summary (Last 24 hours) at 06/10/14 0925 Last data filed at  06/10/14 0602  Gross per 24 hour  Intake 2011.83 ml  Output   9950 ml  Net -7938.17 ml    PHYSICAL EXAMINATION: General:  Sedated on vent Neuro:  Sedate, more alert during WUA, generalized weakness  HEENT:  ET tube + NGT Cardiovascular:  Tachycardic, Normal heart sounds Lungs:  Decreased bs+ Abdomen:  Soft, dressing intact Musculoskeletal:  L arm remains cool, able to doppler pulses Skin:  Intact anteriorly  LABS:  PULMONARY  Recent Labs Lab 06/05/14 1308 06/05/14 1745 06/05/14 1920 06/06/14 0405 06/07/14 0425  PHART 7.248* 7.289* 7.258* 7.251* 7.245*  PCO2ART 47.1* 42.2 44.7 42.0 47.5*  PO2ART 88.0 87.7 141.0* 145.0* 57.3*  HCO3 19.8* 19.6* 19.3* 17.8* 19.9*  TCO2 19.2 18.8 18.6 17.3 19.3  O2SAT 95.4 95.5 97.7 97.8 85.6   CBC  Recent Labs Lab 06/08/14 0513 06/09/14 0520 06/10/14 0300  HGB 8.3* 8.0* 8.9*  HCT 26.1* 25.5* 27.8*  WBC 12.9* 16.0* 16.1*  PLT 315 309 393   COAGULATION  Recent Labs Lab 06/04/14 1030 06/06/14 0425  INR 1.17 1.00   CARDIAC  Recent Labs Lab 06/04/14 0909 06/04/14 1030 06/04/14 1530 06/04/14 2010 06/05/14 0500  TROPONINI 0.12* 0.18* 0.31* 0.22* 0.14*   No results for input(s): PROBNP in the last 168 hours.  CHEMISTRY  Recent Labs Lab 06/06/14 0425 06/07/14 0400 06/08/14 0513 06/09/14 0520 06/10/14  0300  NA 137 138 142 144 146*  K 5.0 4.1 3.7 2.7* 2.1*  CL 115* 114* 109 108 95*  CO2 19 20 25 28  39*  GLUCOSE 153* 120* 107* 145* 126*  BUN 10 9 15  26* 28*  CREATININE 0.86 0.67 0.91 0.82 0.82  CALCIUM 7.1* 7.1* 7.9* 8.0* 7.9*  MG 2.4 2.0 2.0 2.0 1.6  PHOS 2.0* 2.2* 4.7* 3.4 3.5   Estimated Creatinine Clearance: 53.1 mL/min (by C-G formula based on Cr of 0.82).  LIVER  Recent Labs Lab 06/04/14 1030 06/06/14 0425  AST 62*  --   ALT 18  --   ALKPHOS 50  --   BILITOT 0.7  --   PROT 5.3*  --   ALBUMIN 2.3*  --   INR 1.17 1.00   INFECTIOUS  Recent Labs Lab 06/04/14 1030 06/04/14 1040  06/05/14 0500 06/05/14 1358 06/06/14 0425  LATICACIDVEN  --  1.2  --  1.2  --   PROCALCITON 12.31  --  9.78  --  5.49   ENDOCRINE CBG (last 3)   Recent Labs  06/09/14 1622 06/09/14 1929 06/10/14 0823  GLUCAP 126* 119* 108*     IMAGING x48h Dg Chest Port 1 View  06/10/2014   CLINICAL DATA:  Respiratory failure.  EXAM: PORTABLE CHEST - 1 VIEW  COMPARISON:  06/09/2014 .  FINDINGS: Endotracheal tube, left IJ line, NG tube in stable position. Heart size stable. Persistent but slightly improving bilateral diffuse airspace disease. Right mid lung and bibasilar subsegmental atelectasis P. Small left pleural effusion. No pneumothorax.  IMPRESSION: 1. Lines and tubes in stable position. 2. Persistent diffuse bilateral airspace disease, slight improvement from prior exam. Right mid and bibasilar subsegmental atelectasis. Small left pleural effusion again noted.   Electronically Signed   By: Marcello Moores  Register   On: 06/10/2014 07:17   Dg Chest Port 1 View  06/09/2014   CLINICAL DATA:  Respiratory failure.  EXAM: PORTABLE CHEST - 1 VIEW  COMPARISON:  07/05/2014.  FINDINGS: Endotracheal tube, NG tube, left IJ line in stable position. Heart size stable. Persistent bilateral diffuse airspace disease noted. No change. Small left pleural effusion cannot be excluded. No pneumothorax.  IMPRESSION: 1. Lines and tubes in stable position. 2. Persistent bilateral diffuse airspace disease, no interim change. Small left pleural effusion cannot be excluded.   Electronically Signed   By: Marcello Moores  Register   On: 06/09/2014 07:12     ASSESSMENT / PLAN:  PULMONARY OETT  06/04/2014 >> A:  Acute Respiratory failure ARDS P:   ARDS Net protocol for vent - Vt goal 6cc/kg/IBW - to extent possible keep at 6/cc/kg/IBW;  Now back off paralytics and tolerating ROSE NIH [facility] TRIAL - on std of care sedation arm - avoid NMB blockade > will try to stop 4/2  - refer to Page 24 - bedside protocol for precise  criteria  and loop out  - Planned Diuresis per Trial Protocol, see CVS section Continue to wean PEEP and FiO2   CARDIOVASCULAR CVL L IJ 3/30>> A:  CAD, Hx of MI s/p stents in 1980s during age 22s Shock, presumed septic shock due to peritonitis Cool L UE, still has pulses but concerning for clot or ischemia; doppler negative arterial and venous P:  Vasopressors is needed for MAP > 65  Diuresis >/=  12h after patient off vasopressors   - CVP goal 4 per Trial protocol  - diuresis is nursing driven per [facility]AL [facility] bedside flow chart and will  be weighted by CVP, MAP and Urine Output   RENAL A:   Intact but at risk for AKI Hypokalemia in setting diuretics P:   Follow BMP Replace electrolytes as indicated  GASTROINTESTINAL A:   Crohn's disease Perforation s/p surgical resection Peritonitis  P:   Appreciate CCS and GI recs Paralytics off, on TF  HEMATOLOGIC A:   At risk for anemia of critical illness P:  PRBC per ICU guidelines  INFECTIOUS BCx2 >> UC >> negative Tracheal aspirate for bacteria and virus 06/04/14 >> few candida albicans RVP >> negative  A:   Baseline  - Immunocmpromised patient due to humira + prednisone Current   - admit with bowel perf / peritonitis 06/02/14  - pulmonary event -06/04/14 ARDS - trach aspirate with few candida P:   Pan culture as above Zosyn 3/29>> Vanc 3/31 >> Micafungin 06/06/14 (candida in BAL, peritonitis) >>    ENDOCRINE A:   Chronic pred for Crohn's P:   Stress dose hydrocort, at risk for adrenal insufficiency; decreased 4/3 SSI  NEUROLOGIC A:   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    Noe Gens, NP-C [facility] Pgr: [phone] or  [phone] 06/10/2014, 9:25 AM   Attending Note:  I have examined patient, reviewed labs, studies and notes. I have discussed the case with B Ollis, and I agree with the data and plans as amended above. Pt with ARDS following perforation and repair. Did follow commands this am on her WUA, did not desaturate with decreased sedation. Will continue to push forward weaning FiOa and PEEP. Hopefully can lighten sedation soon.  Independent critical care time is 35 minutes.   Baltazar Apo, MD, PhD 06/10/2014, 6:17 PM [facility] [phone] or if no answer [phone]

## 2014-06-10 NOTE — Progress Notes (Signed)
       Patient Name: [name] Date of Encounter: 06/10/2014    SUBJECTIVE: Intubated  TELEMETRY:  NSR Filed Vitals:   06/10/14 0600 06/10/14 0915 06/10/14 0925 06/10/14 0927  BP:  210/83    Pulse: 77 90  102  Temp:      TempSrc:      Resp: 27 40 47 43  Height:      Weight:      SpO2: 94% 92%  92%    Intake/Output Summary (Last 24 hours) at 06/10/14 1006 Last data filed at 06/10/14 0935  Gross per 24 hour  Intake 1865.13 ml  Output  11350 ml  Net -9484.87 ml   LABS: Basic Metabolic Panel:  Recent Labs  06/09/14 0520 06/10/14 0300  NA 144 146*  K 2.7* 2.1*  CL 108 95*  CO2 28 39*  GLUCOSE 145* 126*  BUN 26* 28*  CREATININE 0.82 0.82  CALCIUM 8.0* 7.9*  MG 2.0 1.6  PHOS 3.4 3.5   CBC:  Recent Labs  06/08/14 0513 06/09/14 0520 06/10/14 0300  WBC 12.9* 16.0* 16.1*  NEUTROABS 10.6*  --   --   HGB 8.3* 8.0* 8.9*  HCT 26.1* 25.5* 27.8*  MCV 94.9 94.1 92.7  PLT 315 309 393   ECG : NSR with LBBB  Radiology/Studies:  No new relevant data  Physical Exam: Blood pressure 210/83, pulse 102, temperature 97.7 F (36.5 C), temperature source Axillary, resp. rate 43, height 5' (1.524 m), weight 114 lb 6.7 oz (51.9 kg), SpO2 92 %. Weight change: -17 lb 6.7 oz (-7.9 kg)  Wt Readings from Last 3 Encounters:  06/10/14 114 lb 6.7 oz (51.9 kg)  06/02/14 101 lb (45.813 kg)  05/02/14 101 lb 12.8 oz (46.176 kg)    Rhonchi Regular rate and rhythm  ASSESSMENT:  1. H/O CAD with no active ischemia. 2. Demand ischemia, resolved  Plan:  Please call if we can help further  Signed, Sinclair Grooms 06/10/2014, 10:06 AM

## 2014-06-10 NOTE — Progress Notes (Signed)
ANTIBIOTIC CONSULT NOTE - FOLLOW UP  Pharmacy Consult for Vancomycin, Zosyn, and Micfungin Indication: Peritonitis, Pneumonia, and Trach aspirate candidiasis in an immunocompromised patient  Allergies  Allergen Reactions  . Remicade [Infliximab]     convulsions    Patient Measurements: Height: 5' (152.4 cm) Weight: 114 lb 6.7 oz (51.9 kg) IBW/kg (Calculated) : 45.5 Adjusted Body Weight:   Vital Signs: Temp: 99.8 F (37.7 C) (04/05 0800) Temp Source: Oral (04/05 0800) BP: 194/74 mmHg (04/05 1012) Pulse Rate: 88 (04/05 1100) Intake/Output from previous day: 04/04 0701 - 04/05 0700 In: 2434.3 [I.V.:1169.3; NG/GT:390; IV Piggyback:875] Out: 95621 [Urine:10350; Stool:300] Intake/Output from this shift: Total I/O In: 756.8 [I.V.:86.8; NG/GT:210; IV Piggyback:460] Out: 1400 [Urine:1300; Stool:100]  Labs:  Recent Labs  06/08/14 0513 06/09/14 0520 06/10/14 0300  WBC 12.9* 16.0* 16.1*  HGB 8.3* 8.0* 8.9*  PLT 315 309 393  CREATININE 0.91 0.82 0.82   Estimated Creatinine Clearance: 53.1 mL/min (by C-G formula based on Cr of 0.82).  Recent Labs  06/07/14 2100  [place] 15.5     Microbiology: Recent Results (from the past 720 hour(s))  MRSA PCR Screening     Status: None   Collection Time: 06/02/14 11:50 PM  Result Value Ref Range Status   MRSA by PCR NEGATIVE NEGATIVE Final    Comment:        The GeneXpert MRSA Assay (FDA approved for NASAL specimens only), is one component of a comprehensive MRSA colonization surveillance program. It is not intended to diagnose MRSA infection nor to guide or monitor treatment for MRSA infections.   Culture, respiratory (NON-Expectorated)     Status: None   Collection Time: 06/04/14 10:11 AM  Result Value Ref Range Status   Specimen Description TRACHEAL ASPIRATE  Final   Special Requests Immunocompromised  Final   Gram Stain   Final    RARE WBC PRESENT, PREDOMINANTLY MONONUCLEAR RARE SQUAMOUS EPITHELIAL CELLS  PRESENT NO ORGANISMS SEEN Performed at [facility]    Culture   Final    FEW CANDIDA ALBICANS Performed at [facility]    Report Status 06/06/2014 FINAL  Final  Culture, Urine     Status: None   Collection Time: 06/04/14 10:20 AM  Result Value Ref Range Status   Specimen Description URINE, CATHETERIZED  Final   Special Requests Immunocompromised  Final   Colony Count NO GROWTH Performed at [facility]   Final   Culture NO GROWTH Performed at [facility]   Final   Report Status 06/07/2014 FINAL  Final  Culture, blood (routine x 2)     Status: None   Collection Time: 06/04/14 10:30 AM  Result Value Ref Range Status   Specimen Description BLOOD LEFT HAND  Final   Special Requests BOTTLES DRAWN AEROBIC ONLY 2CC  Final   Culture   Final    NO GROWTH 5 DAYS Performed at [facility]    Report Status 06/10/2014 FINAL  Final  Culture, blood (routine x 2)     Status: None   Collection Time: 06/04/14 10:40 AM  Result Value Ref Range Status   Specimen Description BLOOD RIGHT ARM  Final   Special Requests BOTTLES DRAWN AEROBIC ONLY [place]  Final   Culture   Final    NO GROWTH 5 DAYS Performed at [facility]    Report Status 06/10/2014 FINAL  Final  Respiratory virus panel     Status: None   Collection Time: 06/04/14 11:03 AM  Result  Value Ref Range Status   Respiratory Syncytial Virus A Negative Negative Final   Respiratory Syncytial Virus B Negative Negative Final   Influenza A Negative Negative Final   Influenza B Negative Negative Final   Parainfluenza 1 Negative Negative Final   Parainfluenza 2 Negative Negative Final   Parainfluenza 3 Negative Negative Final   Metapneumovirus Negative Negative Final   Rhinovirus Negative Negative Final   Adenovirus Negative Negative Final    Comment: (NOTE) Performed At: [facility] [place], [place] [id number] Lindon Romp MD [id number]      Anti-infectives    Start     Dose/Rate Route Frequency Ordered Stop   06/06/14 1200  micafungin (MYCAMINE) 100 mg in sodium chloride 0.9 % 100 mL IVPB     100 mg 100 mL/hr over 1 Hours Intravenous Daily 06/06/14 0944     06/05/14 1000  vancomycin (VANCOCIN) 500 mg in sodium chloride 0.9 % 100 mL IVPB     500 mg 100 mL/hr over 60 Minutes Intravenous Every 12 hours 06/05/14 0839     06/04/14 1400  vancomycin (VANCOCIN) IVPB 1000 mg/200 mL premix  Status:  Discontinued     1,000 mg 200 mL/hr over 60 Minutes Intravenous Every 24 hours 06/04/14 1313 06/05/14 0839   06/03/14 0200  piperacillin-tazobactam (ZOSYN) IVPB 3.375 g     3.375 g 12.5 mL/hr over 240 Minutes Intravenous Every 8 hours 06/02/14 2346     06/02/14 1845  piperacillin-tazobactam (ZOSYN) IVPB 3.375 g     3.375 g 12.5 mL/hr over 240 Minutes Intravenous  Once 06/02/14 1833 06/02/14 2257      Assessment: 58 YOF presented 06/02/14 due to diffuse abdominal pain & tachycardia. Patient has a history of Crohn's disease s/p 2 partial colectomies & left-sided ostomy. S/p colonoscopy that found stricture near the ostomy w/ concern for tear of the colon, which was found to be an acute bowel perforation and was taken directly to the OR with the creation of new colostomy. On 06/04/14 the patient went into ARDs and was intubation. NOTE: Patient is a participant in the ROSE trial- Re-evaluation of Systemic Early NMB  Today, 06/10/14 is Day 7 of vancomycin 500mg  IV q12, Day 9 of extended infusion Zosyn per MD & Day 5 micafungin 100mg  q24 for peritonitis, pneumonia & trach aspirate candidiasis in an immunocompromised patient. Since renal function remains unchanged today, antibiotic dosing remains appropriate.  3/28 zosyn>> 3/30 vanc>> 4/1 mica>>  Levels/dose adjustments: 3/31 vanc 1g q24h>>500mg  q12h  4/2 2100 = VT 15.5, 500 q12h>>no change  3/28 MRSA PCR>>neg 3/30 TA Cx>>CA 3/30 BCx>>NGTD 3/30 UCx>>neg 3/30 RVP>>neg  Today,  06/10/14:  Tmax: 100.9  WBCs: increasing and elevated at 16.1  Renal: SCr 0.82 with CrCl~53 mL/min (normalized ~15mL/min)  06/07/14 VT = 15.5  Goal of Therapy:  Vancomycin trough level 15-20 mcg/ml  Appropriate antibiotic dosing Eradication of infection  Plan:  Continue vancomycin 500mg  IV q12h Continue Zosyn 3.375g IV q8h Continue micafungin 100ng IV daily Recheck vancomycin trough as needed Follow up renal function, culture results, and clinical course/improvement  Gloriajean Dell, PharmD Candidate   Addendum 06/10/2014 12:56 PM : I agree with the above assessment and plan.  Hershal Coria, PharmD, BCPS Pager: [phone] 06/10/2014 12:56 PM

## 2014-06-10 NOTE — Progress Notes (Signed)
8 Days Post-Op  Subject: Intubated and sedated on vent but a little more alert today and able to follow a simple command  Objective: Vital signs in last 24 hours: Temp:  [97.7 F (36.5 C)-100.9 F (38.3 C)] 97.7 F (36.5 C) (04/05 0400) Pulse Rate:  [66-93] 77 (04/05 0600) Resp:  [19-45] 27 (04/05 0600) BP: (136-196)/(47-77) 168/57 mmHg (04/05 0508) SpO2:  [88 %-97 %] 94 % (04/05 0600) Arterial Line BP: (106-191)/(39-75) 161/59 mmHg (04/05 0600) FiO2 (%):  [40 %-50 %] 40 % (04/05 0508) Weight:  [51.9 kg (114 lb 6.7 oz)] 51.9 kg (114 lb 6.7 oz) (04/05 0315) Last BM Date: 06/10/14 (colostomy)  Intake/Output from previous day: 04/04 0701 - 04/05 0700 In: 2394.3 [I.V.:1159.3; NG/GT:360; IV Piggyback:875] Out: 93716 [Urine:10350; Stool:300] Intake/Output this shift:    Resp: clear to auscultation bilaterally Cardio: regular rate and rhythm GI: soft, nontender. vac in place. ostomy pink and productive  Lab Results:   Recent Labs  06/09/14 0520 06/10/14 0300  WBC 16.0* 16.1*  HGB 8.0* 8.9*  HCT 25.5* 27.8*  PLT 309 393   BMET  Recent Labs  06/09/14 0520 06/10/14 0300  NA 144 146*  K 2.7* 2.1*  CL 108 95*  CO2 28 39*  GLUCOSE 145* 126*  BUN 26* 28*  CREATININE 0.82 0.82  CALCIUM 8.0* 7.9*   PT/INR No results for input(s): LABPROT, INR in the last 72 hours. ABG No results for input(s): PHART, HCO3 in the last 72 hours.  Invalid input(s): PCO2, PO2  Studies/Results: Dg Chest Port 1 View  06/10/2014   CLINICAL DATA:  Respiratory failure.  EXAM: PORTABLE CHEST - 1 VIEW  COMPARISON:  06/09/2014 .  FINDINGS: Endotracheal tube, left IJ line, NG tube in stable position. Heart size stable. Persistent but slightly improving bilateral diffuse airspace disease. Right mid lung and bibasilar subsegmental atelectasis P. Small left pleural effusion. No pneumothorax.  IMPRESSION: 1. Lines and tubes in stable position. 2. Persistent diffuse bilateral airspace disease, slight  improvement from prior exam. Right mid and bibasilar subsegmental atelectasis. Small left pleural effusion again noted.   Electronically Signed   By: Marcello Moores  Register   On: 06/10/2014 07:17   Dg Chest Port 1 View  06/09/2014   CLINICAL DATA:  Respiratory failure.  EXAM: PORTABLE CHEST - 1 VIEW  COMPARISON:  07/05/2014.  FINDINGS: Endotracheal tube, NG tube, left IJ line in stable position. Heart size stable. Persistent bilateral diffuse airspace disease noted. No change. Small left pleural effusion cannot be excluded. No pneumothorax.  IMPRESSION: 1. Lines and tubes in stable position. 2. Persistent bilateral diffuse airspace disease, no interim change. Small left pleural effusion cannot be excluded.   Electronically Signed   By: Marcello Moores  Register   On: 06/09/2014 07:12    Anti-infectives: Anti-infectives    Start     Dose/Rate Route Frequency Ordered Stop   06/06/14 1200  micafungin (MYCAMINE) 100 mg in sodium chloride 0.9 % 100 mL IVPB     100 mg 100 mL/hr over 1 Hours Intravenous Daily 06/06/14 0944     06/05/14 1000  vancomycin (VANCOCIN) 500 mg in sodium chloride 0.9 % 100 mL IVPB     500 mg 100 mL/hr over 60 Minutes Intravenous Every 12 hours 06/05/14 0839     06/04/14 1400  vancomycin (VANCOCIN) IVPB 1000 mg/200 mL premix  Status:  Discontinued     1,000 mg 200 mL/hr over 60 Minutes Intravenous Every 24 hours 06/04/14 1313 06/05/14 0839   06/03/14 0200  piperacillin-tazobactam (ZOSYN) IVPB 3.375 g     3.375 g 12.5 mL/hr over 240 Minutes Intravenous Every 8 hours 06/02/14 2346     06/02/14 1845  piperacillin-tazobactam (ZOSYN) IVPB 3.375 g     3.375 g 12.5 mL/hr over 240 Minutes Intravenous  Once 06/02/14 1833 06/02/14 2257      Assessment/Plan: s/p Procedure(s): EXPLORATORY LAPAROTOMY/PARTIAL COLECTOMY COLOSTOMY (N/A) Continue tube feeds  VDRF not much different. Per CCM Continue abx  LOS: 8 days    TOTH III,[name] S 06/10/2014

## 2014-06-10 NOTE — Progress Notes (Signed)
CRITICAL VALUE ALERT  Critical value received:  K+ 2.5, CO 2 46  Date of notification:  06-09-2014  Time of notification:  0923  Critical value read back: yes  Nurse who received alert:  Jerrel Ivory, RN  MD notified (1st page): Called Elink, Dr. Elsworth Soho  Time of first page:    MD notified (2nd page):  Time of second page:  Responding MD:  Dr. Elsworth Soho  Time MD responded:

## 2014-06-11 ENCOUNTER — Inpatient Hospital Stay (HOSPITAL_COMMUNITY): Payer: Commercial Managed Care - HMO

## 2014-06-11 LAB — BASIC METABOLIC PANEL
ANION GAP: 6 (ref 5–15)
BUN: 29 mg/dL — ABNORMAL HIGH (ref 6–23)
CO2: 43 mmol/L (ref 19–32)
Calcium: 7.9 mg/dL — ABNORMAL LOW (ref 8.4–10.5)
Chloride: 98 mmol/L (ref 96–112)
Creatinine, Ser: 0.74 mg/dL (ref 0.50–1.10)
GFR calc non Af Amer: >90 mL/min (ref >90)
Glucose, Bld: 146 mg/dL — ABNORMAL HIGH (ref 70–99)
Potassium: 3.8 mmol/L (ref 3.5–5.1)
Sodium: 147 mmol/L — ABNORMAL HIGH (ref 135–145)

## 2014-06-11 LAB — BLOOD GAS, ARTERIAL
ACID-BASE EXCESS: 16.5 mmol/L — AB (ref 0.0–2.0)
ACID-BASE EXCESS: 16.8 mmol/L — AB (ref 0.0–2.0)
ACID-BASE EXCESS: 18.2 mmol/L — AB (ref 0.0–2.0)
BICARBONATE: 42.3 meq/L — AB (ref 20.0–24.0)
Bicarbonate: 42.5 mEq/L — ABNORMAL HIGH (ref 20.0–24.0)
Bicarbonate: 44.3 mEq/L — ABNORMAL HIGH (ref 20.0–24.0)
Drawn by: 257701
Drawn by: 257701
Drawn by: 257701
FIO2: 0.4 %
FIO2: 0.4 %
FIO2: 0.4 %
LHR: 30 {breaths}/min
MECHVT: 360 mL
O2 SAT: 92.5 %
O2 SAT: 95.6 %
O2 Saturation: 96.3 %
PATIENT TEMPERATURE: 37
PATIENT TEMPERATURE: 37
PCO2 ART: 56 mmHg — AB (ref 35.0–45.0)
PEEP/CPAP: 8 cmH2O
PEEP/CPAP: 5 cmH2O
PEEP: 5 cmH2O
PH ART: 7.493 — AB (ref 7.350–7.450)
PH ART: 7.489 — AB (ref 7.350–7.450)
PO2 ART: 92.2 mmHg (ref 80.0–100.0)
Patient temperature: 37
RATE: 24 resp/min
RATE: 20 resp/min
TCO2: 38.1 mmol/L (ref 0–100)
TCO2: 39.1 mmol/L (ref 0–100)
TCO2: 40.6 mmol/L (ref 0–100)
VT: 360 mL
VT: 360 mL
pCO2 arterial: 55.4 mmHg — ABNORMAL HIGH (ref 35.0–45.0)
pCO2 arterial: 58.8 mmHg (ref 35.0–45.0)
pH, Arterial: 7.495 — ABNORMAL HIGH (ref 7.350–7.450)
pO2, Arterial: 70.6 mmHg — ABNORMAL LOW (ref 80.0–100.0)
pO2, Arterial: 89.5 mmHg (ref 80.0–100.0)

## 2014-06-11 LAB — GLUCOSE, CAPILLARY
Glucose-Capillary: 133 mg/dL — ABNORMAL HIGH (ref 70–99)
Glucose-Capillary: 117 mg/dL — ABNORMAL HIGH (ref 70–99)
Glucose-Capillary: 107 mg/dL — ABNORMAL HIGH (ref 70–99)
Glucose-Capillary: 128 mg/dL — ABNORMAL HIGH (ref 70–99)
Glucose-Capillary: 114 mg/dL — ABNORMAL HIGH (ref 70–99)
Glucose-Capillary: 114 mg/dL — ABNORMAL HIGH (ref 70–99)

## 2014-06-11 LAB — CBC
HEMATOCRIT: 27.5 % — AB (ref 36.0–46.0)
HEMOGLOBIN: 8.5 g/dL — AB (ref 12.0–15.0)
MCH: 29.6 pg (ref 26.0–34.0)
MCHC: 30.9 g/dL (ref 30.0–36.0)
MCV: 95.8 fL (ref 78.0–100.0)
Platelets: 406 10*3/uL — ABNORMAL HIGH (ref 150–400)
RBC: 2.87 MIL/uL — ABNORMAL LOW (ref 3.87–5.11)
RDW: 14.5 % (ref 11.5–15.5)
WBC: 17.3 10*3/uL — ABNORMAL HIGH (ref 4.0–10.5)

## 2014-06-11 MED ORDER — FUROSEMIDE 10 MG/ML IJ SOLN
20.0000 mg | Freq: Once | INTRAMUSCULAR | Status: AC
  Administered 2014-06-11: 20 mg via INTRAVENOUS
  Filled 2014-06-11: qty 2

## 2014-06-11 MED ORDER — HYDROCORTISONE NA SUCCINATE PF 100 MG IJ SOLR
25.0000 mg | Freq: Every day | INTRAMUSCULAR | Status: DC
  Administered 2014-06-12 – 2014-06-18 (×7): 25 mg via INTRAVENOUS
  Filled 2014-06-11 (×7): qty 2

## 2014-06-11 MED ORDER — FUROSEMIDE 10 MG/ML IJ SOLN
INTRAMUSCULAR | Status: AC
  Filled 2014-06-11: qty 2

## 2014-06-11 MED ORDER — FUROSEMIDE 10 MG/ML IJ SOLN
20.0000 mg | Freq: Once | INTRAMUSCULAR | Status: AC
  Administered 2014-06-11: 20 mg via INTRAVENOUS

## 2014-06-11 NOTE — Progress Notes (Signed)
PULMONARY / CRITICAL CARE MEDICINE   Name: [name] MRN: [id number] DOB: 1955-10-26   PCP Nance Pear., NP   ADMISSION DATE:  06/02/2014 CONSULTATION DATE:  06/04/2014   REFERRING MD :  Dr Leighton Ruff  BRIEF SUMMARY:  59 y/o F, smoker, with hx of MI in the 1980s with s.p stent (details not known), prolonged ICU stay for septic shock from PNA/cellulitis (2009) at [facility] and IBD immunsupprssed on humira, ? Prednisone and sulfasalazine. Admitted 3/28 with colonic bowel perforation and s/p ex-lap with partial colectomy / resection of colostomy & creation of new colostomy 3/28.  On 3/30, she had sudden respiratory decompensation requiring emergent intubation.  Clear CXR on admit.  Post intubation CXR c/w pulmonary edema.  Progressed to ARDS 3/31.     SIGNIFICANT EVENTS: 3/28  Admit 3/30  PCCM consult for abrupt resp decline 3/31  ARDS NET + Start  NIH ROSE Trial (Re-evaluaton Of Systemic Early NMB) - randomized to sedation only Arm  4/03  LUE arterial and venous dopplers >> negative 4/05  Weaned to 0.40, PEEP 8  SUBJECTIVE:  FiO2 at 0.40, PEEP at 8, RN reports WUA tolerated / alert but BP went up.  No acute issues.    VITAL SIGNS: Temp:  [97.7 F (36.5 C)-101.1 F (38.4 C)] 97.7 F (36.5 C) (04/06 0800) Pulse Rate:  [63-111] 111 (04/06 1000) Resp:  [17-40] 33 (04/06 1000) BP: (104-179)/(42-70) 105/42 mmHg (04/06 0346) SpO2:  [89 %-99 %] 89 % (04/06 1000) Arterial Line BP: (89-207)/(35-79) 185/73 mmHg (04/06 1000) FiO2 (%):  [40 %] 40 % (04/06 1034) Weight:  [50.4 kg (111 lb 1.8 oz)] 50.4 kg (111 lb 1.8 oz) (04/06 0500)   HEMODYNAMICS: CVP:  [4 mmHg-8 mmHg] 4 mmHg   VENTILATOR SETTINGS: Vent Mode:  [-] PRVC FiO2 (%):  [40 %] 40 % Set Rate:  [30 bmp-35 bmp] 30 bmp Vt Set:  [280 mL-360 mL] 360 mL PEEP:  [5 cmH20-8 cmH20] 8 cmH20 Plateau Pressure:  [20 cmH20-27 cmH20] 27 cmH20   INTAKE / OUTPUT:  Intake/Output Summary (Last 24 hours) at 06/11/14 1042 Last  data filed at 06/11/14 1000  Gross per 24 hour  Intake 3392.24 ml  Output   3755 ml  Net -362.76 ml    PHYSICAL EXAMINATION: General:  Sedated on vent Neuro:  Sedate, more alert during WUA, generalized weakness  HEENT:  ET tube + NGT Cardiovascular:  Tachycardic, Normal heart sounds Lungs:  Decreased bs+ Abdomen:  Soft, dressing intact Musculoskeletal:  L arm remains cool, able to doppler pulses Skin:  Intact anteriorly  LABS:  PULMONARY  Recent Labs Lab 06/05/14 1308 06/05/14 1745 06/05/14 1920 06/06/14 0405 06/07/14 0425  PHART 7.248* 7.289* 7.258* 7.251* 7.245*  PCO2ART 47.1* 42.2 44.7 42.0 47.5*  PO2ART 88.0 87.7 141.0* 145.0* 57.3*  HCO3 19.8* 19.6* 19.3* 17.8* 19.9*  TCO2 19.2 18.8 18.6 17.3 19.3  O2SAT 95.4 95.5 97.7 97.8 85.6   CBC  Recent Labs Lab 06/09/14 0520 06/10/14 0300 06/11/14 0500  HGB 8.0* 8.9* 8.5*  HCT 25.5* 27.8* 27.5*  WBC 16.0* 16.1* 17.3*  PLT 309 393 406*   COAGULATION  Recent Labs Lab 06/06/14 0425  INR 1.00   CARDIAC  Recent Labs Lab 06/04/14 1530 06/04/14 2010 06/05/14 0500  TROPONINI 0.31* 0.22* 0.14*   No results for input(s): PROBNP in the last 168 hours.  CHEMISTRY  Recent Labs Lab 06/06/14 0425 06/07/14 0400 06/08/14 0513 06/09/14 0520 06/10/14 0300 06/10/14 1640 06/11/14 0500  NA 137  138 142 144 146* 147* 147*  K 5.0 4.1 3.7 2.7* 2.1* 2.5* 3.8  CL 115* 114* 109 108 95* 92* 98  CO2 19 20 25 28  39* 46* 43*  GLUCOSE 153* 120* 107* 145* 126* 137* 146*  BUN 10 9 15  26* 28* 27* 29*  CREATININE 0.86 0.67 0.91 0.82 0.82 0.79 0.74  CALCIUM 7.1* 7.1* 7.9* 8.0* 7.9* 7.6* 7.9*  MG 2.4 2.0 2.0 2.0 1.6  --   --   PHOS 2.0* 2.2* 4.7* 3.4 3.5  --   --    Estimated Creatinine Clearance: 54.4 mL/min (by C-G formula based on Cr of 0.74).  LIVER  Recent Labs Lab 06/06/14 0425  INR 1.00   INFECTIOUS  Recent Labs Lab 06/05/14 0500 06/05/14 1358 06/06/14 0425  LATICACIDVEN  --  1.2  --   PROCALCITON  9.78  --  5.49   ENDOCRINE CBG (last 3)   Recent Labs  06/10/14 1652 06/10/14 2003 06/11/14 0438  GLUCAP 143* 118* 133*     IMAGING x48h Dg Chest Port 1 View  06/11/2014   CLINICAL DATA:  Respiratory failure.  EXAM: PORTABLE CHEST - 1 VIEW  COMPARISON:  06/10/2014 .  FINDINGS: Endotracheal tube, left IJ line, NG tube in stable position. Mediastinum hilar structures stable. Heart size stable. Persistent diffuse bilateral airspace disease. Atelectatic changes in the right mid lung. Persistent left pleural effusion. No pneumothorax. No acute osseous abnormality.  IMPRESSION: 1. Lines and tubes in stable position. 2. Persistent bilateral airspace disease with prominent atelectatic changes in the right mid lung. Persistent left pleural effusion.   Electronically Signed   By: [place]   On: 06/11/2014 07:13   Dg Chest Port 1 View  06/10/2014   CLINICAL DATA:  Respiratory failure.  EXAM: PORTABLE CHEST - 1 VIEW  COMPARISON:  06/09/2014 .  FINDINGS: Endotracheal tube, left IJ line, NG tube in stable position. Heart size stable. Persistent but slightly improving bilateral diffuse airspace disease. Right mid lung and bibasilar subsegmental atelectasis P. Small left pleural effusion. No pneumothorax.  IMPRESSION: 1. Lines and tubes in stable position. 2. Persistent diffuse bilateral airspace disease, slight improvement from prior exam. Right mid and bibasilar subsegmental atelectasis. Small left pleural effusion again noted.   Electronically Signed   By: Marcello Moores  Register   On: 06/10/2014 07:17    ASSESSMENT / PLAN:  PULMONARY OETT  06/04/2014 >> A:  Acute Respiratory failure ARDS P:   ARDS Net protocol for vent - Vt goal 6cc/kg/IBW - believe we can now liberalize to 8cc/kg based on improved CXR, improved gas exchange, desire to decrease sedation. She has not required any further paralytic  ROSE NIH PETAL NETWORK TRIAL - on std of care sedation arm  - refer to Page 24 - bedside protocol for  precise  criteria and loop out  - Planned Diuresis per Trial Protocol, see CVS section Continue to wean PEEP   CARDIOVASCULAR CVL L IJ 3/30>> A:  CAD, Hx of MI s/p stents in 1980s during age 74s Shock, presumed septic shock due to peritonitis Cool L UE, still has pulses but concerning for clot or ischemia; doppler negative arterial and venous P:  Vasopressors as needed for MAP > 65  Diuresis >/=  12h after patient off vasopressors   - CVP goal 4 per Trial protocol  - diuresis is nursing driven per [place] bedside flow chart and will be weighted by CVP, MAP and Urine Output  RENAL A:   Intact but  at risk for AKI Hypokalemia in setting diuretics P:   Follow BMP Replace electrolytes as indicated  GASTROINTESTINAL A:   Crohn's disease Perforation s/p surgical resection Peritonitis  P:   Appreciate CCS and GI recs Tolerating TF  HEMATOLOGIC A:   At risk for anemia of critical illness P:  PRBC per ICU guidelines  INFECTIOUS BCx2 >> negative UC >> negative Tracheal aspirate for bacteria and virus 06/04/14 >> few candida albicans RVP >> negative A:   Baseline  - Immunocmpromised patient due to humira + prednisone Current   - admit with bowel perf / peritonitis 06/02/14  - pulmonary event -06/04/14 ARDS - trach aspirate with few candida P:   Zosyn 3/29>> (plan 10-14 days) Vanc 3/31 >> 4/6 Micafungin 06/06/14 (candida in BAL, peritonitis) >>  Check Pct, a negative result would influence timing on d/c zosyn   ENDOCRINE A:   Chronic pred for Crohn's P:   Stress dose hydrocort, at risk for adrenal insufficiency; decreased 4/3 SSI  NEUROLOGIC A:   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    Independent critical care time is 40 minutes.   Baltazar Apo, MD,  PhD 06/11/2014, 10:42 AM [facility] [phone] or if no answer [phone]

## 2014-06-11 NOTE — Progress Notes (Signed)
9 Days Post-Op  Subjective: Intubated and sedated. Less alert today  Objective: Vital signs in last 24 hours: Temp:  [97.7 F (36.5 C)-101.1 F (38.4 C)] 97.7 F (36.5 C) (04/06 0800) Pulse Rate:  [63-102] 99 (04/06 0800) Resp:  [19-47] 40 (04/06 0800) BP: (104-210)/(42-83) 105/42 mmHg (04/06 0346) SpO2:  [89 %-99 %] 99 % (04/06 0800) Arterial Line BP: (89-216)/(35-85) 175/74 mmHg (04/06 0800) FiO2 (%):  [40 %] 40 % (04/06 0800) Weight:  [50.4 kg (111 lb 1.8 oz)] 50.4 kg (111 lb 1.8 oz) (04/06 0500) Last BM Date: 06/11/14  Intake/Output from previous day: 04/05 0701 - 04/06 0700 In: 3132.9 [I.V.:1249.9; NG/GT:723; IV Piggyback:910] Out: 6761 [Urine:4375; Drains:30; Stool:450] Intake/Output this shift: Total I/O In: 446.9 [I.V.:13.4; NG/GT:433.5] Out: -   Resp: clear to auscultation bilaterally and on vent Cardio: regular rate and rhythm GI: soft, nontender. ostomy pink and productive. wound clean  Lab Results:   Recent Labs  06/10/14 0300 06/11/14 0500  WBC 16.1* 17.3*  HGB 8.9* 8.5*  HCT 27.8* 27.5*  PLT 393 406*   BMET  Recent Labs  06/10/14 1640 06/11/14 0500  NA 147* 147*  K 2.5* 3.8  CL 92* 98  CO2 46* 43*  GLUCOSE 137* 146*  BUN 27* 29*  CREATININE 0.79 0.74  CALCIUM 7.6* 7.9*   PT/INR No results for input(s): LABPROT, INR in the last 72 hours. ABG No results for input(s): PHART, HCO3 in the last 72 hours.  Invalid input(s): PCO2, PO2  Studies/Results: Dg Chest Port 1 View  06/11/2014   CLINICAL DATA:  Respiratory failure.  EXAM: PORTABLE CHEST - 1 VIEW  COMPARISON:  06/10/2014 .  FINDINGS: Endotracheal tube, left IJ line, NG tube in stable position. Mediastinum hilar structures stable. Heart size stable. Persistent diffuse bilateral airspace disease. Atelectatic changes in the right mid lung. Persistent left pleural effusion. No pneumothorax. No acute osseous abnormality.  IMPRESSION: 1. Lines and tubes in stable position. 2. Persistent  bilateral airspace disease with prominent atelectatic changes in the right mid lung. Persistent left pleural effusion.   Electronically Signed   By: [place]   On: 06/11/2014 07:13   Dg Chest Port 1 View  06/10/2014   CLINICAL DATA:  Respiratory failure.  EXAM: PORTABLE CHEST - 1 VIEW  COMPARISON:  06/09/2014 .  FINDINGS: Endotracheal tube, left IJ line, NG tube in stable position. Heart size stable. Persistent but slightly improving bilateral diffuse airspace disease. Right mid lung and bibasilar subsegmental atelectasis P. Small left pleural effusion. No pneumothorax.  IMPRESSION: 1. Lines and tubes in stable position. 2. Persistent diffuse bilateral airspace disease, slight improvement from prior exam. Right mid and bibasilar subsegmental atelectasis. Small left pleural effusion again noted.   Electronically Signed   By: Marcello Moores  Register   On: 06/10/2014 07:17    Anti-infectives: Anti-infectives    Start     Dose/Rate Route Frequency Ordered Stop   06/06/14 1200  micafungin (MYCAMINE) 100 mg in sodium chloride 0.9 % 100 mL IVPB     100 mg 100 mL/hr over 1 Hours Intravenous Daily 06/06/14 0944     06/05/14 1000  vancomycin (VANCOCIN) 500 mg in sodium chloride 0.9 % 100 mL IVPB     500 mg 100 mL/hr over 60 Minutes Intravenous Every 12 hours 06/05/14 0839     06/04/14 1400  vancomycin (VANCOCIN) IVPB 1000 mg/200 mL premix  Status:  Discontinued     1,000 mg 200 mL/hr over 60 Minutes Intravenous Every 24 hours  06/04/14 1313 06/05/14 0839   06/03/14 0200  piperacillin-tazobactam (ZOSYN) IVPB 3.375 g     3.375 g 12.5 mL/hr over 240 Minutes Intravenous Every 8 hours 06/02/14 2346     06/02/14 1845  piperacillin-tazobactam (ZOSYN) IVPB 3.375 g     3.375 g 12.5 mL/hr over 240 Minutes Intravenous  Once 06/02/14 1833 06/02/14 2257      Assessment/Plan: s/p Procedure(s): EXPLORATORY LAPAROTOMY/PARTIAL COLECTOMY COLOSTOMY (N/A) Continue tube feeds  Continue abx. WBC creeping back up.  Doubt abdominal source but if there is any question we could scan her  Will follow  LOS: 9 days    TOTH III,Rosalba Totty S 06/11/2014

## 2014-06-11 NOTE — Progress Notes (Signed)
RT called [facility] spoke with Luellen Pucker- she will have Dr. Elsworth Soho call RT at [phone] concerning ABG / ARDS/ RR.

## 2014-06-12 ENCOUNTER — Other Ambulatory Visit: Payer: Self-pay

## 2014-06-12 ENCOUNTER — Inpatient Hospital Stay (HOSPITAL_COMMUNITY): Payer: Commercial Managed Care - HMO

## 2014-06-12 LAB — BASIC METABOLIC PANEL
Anion gap: 9 (ref 5–15)
BUN: 28 mg/dL — ABNORMAL HIGH (ref 6–23)
CO2: 40 mmol/L — AB (ref 19–32)
Calcium: 8.1 mg/dL — ABNORMAL LOW (ref 8.4–10.5)
Chloride: 101 mmol/L (ref 96–112)
Creatinine, Ser: 0.68 mg/dL (ref 0.50–1.10)
GFR calc non Af Amer: >90 mL/min (ref >90)
GLUCOSE: 107 mg/dL — AB (ref 70–99)
POTASSIUM: 3.3 mmol/L — AB (ref 3.5–5.1)
Sodium: 150 mmol/L — ABNORMAL HIGH (ref 135–145)

## 2014-06-12 LAB — CBC
HEMATOCRIT: 26.5 % — AB (ref 36.0–46.0)
Hemoglobin: 8 g/dL — ABNORMAL LOW (ref 12.0–15.0)
MCH: 29.4 pg (ref 26.0–34.0)
MCHC: 30.2 g/dL (ref 30.0–36.0)
MCV: 97.4 fL (ref 78.0–100.0)
Platelets: 439 10*3/uL — ABNORMAL HIGH (ref 150–400)
RBC: 2.72 MIL/uL — ABNORMAL LOW (ref 3.87–5.11)
RDW: 14.9 % (ref 11.5–15.5)
WBC: 17.3 10*3/uL — AB (ref 4.0–10.5)

## 2014-06-12 LAB — BLOOD GAS, ARTERIAL
Acid-Base Excess: 14.2 mmol/L — ABNORMAL HIGH (ref 0.0–2.0)
Bicarbonate: 39.2 mEq/L — ABNORMAL HIGH (ref 20.0–24.0)
Drawn by: 257701
FIO2: 0.4 %
MECHVT: 360 mL
O2 Saturation: 94 %
PATIENT TEMPERATURE: 98.6
PEEP: 5 cmH2O
PH ART: 7.481 — AB (ref 7.350–7.450)
RATE: 20 resp/min
TCO2: 36.9 mmol/L (ref 0–100)
pCO2 arterial: 53.1 mmHg — ABNORMAL HIGH (ref 35.0–45.0)
pO2, Arterial: 75.2 mmHg — ABNORMAL LOW (ref 80.0–100.0)

## 2014-06-12 LAB — GLUCOSE, CAPILLARY
GLUCOSE-CAPILLARY: 121 mg/dL — AB (ref 70–99)
GLUCOSE-CAPILLARY: 94 mg/dL (ref 70–99)
Glucose-Capillary: 108 mg/dL — ABNORMAL HIGH (ref 70–99)

## 2014-06-12 LAB — MAGNESIUM: Magnesium: 1.9 mg/dL (ref 1.5–2.5)

## 2014-06-12 LAB — PHOSPHORUS: Phosphorus: 2.2 mg/dL — ABNORMAL LOW (ref 2.3–4.6)

## 2014-06-12 MED ORDER — FUROSEMIDE 10 MG/ML IJ SOLN
20.0000 mg | Freq: Once | INTRAMUSCULAR | Status: AC
  Administered 2014-06-12: 20 mg via INTRAVENOUS
  Filled 2014-06-12: qty 2

## 2014-06-12 MED ORDER — FREE WATER
200.0000 mL | Freq: Three times a day (TID) | Status: DC
  Administered 2014-06-12 – 2014-06-13 (×3): 200 mL

## 2014-06-12 MED ORDER — FUROSEMIDE 10 MG/ML IJ SOLN
20.0000 mg | Freq: Once | INTRAMUSCULAR | Status: DC
  Filled 2014-06-12: qty 2

## 2014-06-12 MED ORDER — POTASSIUM PHOSPHATES 15 MMOLE/5ML IV SOLN
30.0000 mmol | Freq: Once | INTRAVENOUS | Status: AC
  Administered 2014-06-12: 30 mmol via INTRAVENOUS
  Filled 2014-06-12: qty 10

## 2014-06-12 MED ORDER — VITAL AF 1.2 CAL PO LIQD
1000.0000 mL | ORAL | Status: DC
  Administered 2014-06-12 – 2014-06-13 (×2): 1000 mL
  Filled 2014-06-12 (×3): qty 1000

## 2014-06-12 NOTE — Progress Notes (Signed)
PULMONARY / CRITICAL CARE MEDICINE   Name: TAMMATHA COBB MRN: [id number] DOB: Dec 05, 1955   PCP Nance Pear., NP   ADMISSION DATE:  06/02/2014 CONSULTATION DATE:  06/04/2014   REFERRING MD :  Dr Leighton Ruff  BRIEF SUMMARY:  59 y/o F, smoker, with hx of MI in the 1980s with s.p stent (details not known), prolonged ICU stay for septic shock from PNA/cellulitis (2009) at [facility] and IBD immunsupprssed on humira, ? Prednisone and sulfasalazine. Admitted 3/28 with colonic bowel perforation and s/p ex-lap with partial colectomy / resection of colostomy & creation of new colostomy 3/28.  On 3/30, she had sudden respiratory decompensation requiring emergent intubation.  Clear CXR on admit.  Post intubation CXR c/w pulmonary edema.  Progressed to ARDS 3/31.     SIGNIFICANT EVENTS: 3/28  Admit 3/30  PCCM consult for abrupt resp decline 3/31  ARDS NET + Start  NIH ROSE Trial (Re-evaluaton Of Systemic Early NMB) - randomized to sedation only Arm  4/03  LUE arterial and venous dopplers >> negative 4/05  Weaned to 0.40, PEEP 8 4/06  Vt liberalized to 8cc/kg  SUBJECTIVE:  PEEP down to 5 Vt liberalized to 8cc/kg on 4/6  VITAL SIGNS: Temp:  [97.9 F (36.6 C)-100.5 F (38.1 C)] 100.5 F (38.1 C) (04/07 0800) Pulse Rate:  [42-114] 66 (04/07 1202) Resp:  [20-29] 20 (04/07 1202) BP: (128-154)/(53-62) 151/62 mmHg (04/07 1202) SpO2:  [91 %-97 %] 95 % (04/07 1202) Arterial Line BP: (97-169)/(38-72) 166/67 mmHg (04/07 1200) FiO2 (%):  [30 %-40 %] 30 % (04/07 1202) Weight:  [48.5 kg (106 lb 14.8 oz)] 48.5 kg (106 lb 14.8 oz) (04/07 0650)   HEMODYNAMICS: CVP:  [5 mmHg-12 mmHg] 12 mmHg   VENTILATOR SETTINGS: Vent Mode:  [-] PRVC FiO2 (%):  [30 %-40 %] 30 % Set Rate:  [20 bmp-24 bmp] 20 bmp Vt Set:  [360 mL] 360 mL PEEP:  [5 cmH20] 5 cmH20 Plateau Pressure:  [13 cmH20-23 cmH20] 20 cmH20   INTAKE / OUTPUT:  Intake/Output Summary (Last 24 hours) at 06/12/14 1231 Last data filed at  06/12/14 1221  Gross per 24 hour  Intake 1720.82 ml  Output   2700 ml  Net -979.18 ml    PHYSICAL EXAMINATION: General:  Sedated on vent Neuro:  Sedate, more alert during WUA, generalized weakness  HEENT:  ET tube + NGT Cardiovascular:  Tachycardic, Normal heart sounds Lungs:  Decreased bs+ Abdomen:  Soft, dressing intact, vac in place Musculoskeletal:  L arm remains cool, able to doppler pulses Skin:  Intact anteriorly  LABS:  PULMONARY  Recent Labs Lab 06/07/14 0425 06/11/14 1145 06/11/14 1429 06/11/14 1557 06/12/14 1000  PHART 7.245* 7.493* 7.495* 7.489* 7.481*  PCO2ART 47.5* 56.0* 55.4* 58.8* 53.1*  PO2ART 57.3* 70.6* 92.2 89.5 75.2*  HCO3 19.9* 42.5* 42.3* 44.3* 39.2*  TCO2 19.3 38.1 39.1 40.6 36.9  O2SAT 85.6 92.5 96.3 95.6 94.0   CBC  Recent Labs Lab 06/10/14 0300 06/11/14 0500 06/12/14 0420  HGB 8.9* 8.5* 8.0*  HCT 27.8* 27.5* 26.5*  WBC 16.1* 17.3* 17.3*  PLT 393 406* 439*   COAGULATION  Recent Labs Lab 06/06/14 0425  INR 1.00   CARDIAC No results for input(s): TROPONINI in the last 168 hours. No results for input(s): PROBNP in the last 168 hours.  CHEMISTRY  Recent Labs Lab 06/07/14 0400 06/08/14 0513 06/09/14 0520 06/10/14 0300 06/10/14 1640 06/11/14 0500 06/12/14 0420  NA 138 142 144 146* 147* 147* 150*  K 4.1 3.7  2.7* 2.1* 2.5* 3.8 3.3*  CL 114* 109 108 95* 92* 98 101  CO2 20 25 28  39* 46* 43* 40*  GLUCOSE 120* 107* 145* 126* 137* 146* 107*  BUN 9 15 26* 28* 27* 29* 28*  CREATININE 0.67 0.91 0.82 0.82 0.79 0.74 0.68  CALCIUM 7.1* 7.9* 8.0* 7.9* 7.6* 7.9* 8.1*  MG 2.0 2.0 2.0 1.6  --   --  1.9  PHOS 2.2* 4.7* 3.4 3.5  --   --  2.2*   Estimated Creatinine Clearance: 54.4 mL/min (by C-G formula based on Cr of 0.68).  LIVER  Recent Labs Lab 06/06/14 0425  INR 1.00   INFECTIOUS  Recent Labs Lab 06/05/14 1358 06/06/14 0425  LATICACIDVEN 1.2  --   PROCALCITON  --  5.49   ENDOCRINE CBG (last 3)   Recent Labs   06/11/14 1919 06/12/14 0040 06/12/14 0517  GLUCAP 114* 94 108*     IMAGING x48h Dg Chest Port 1 View  06/12/2014   CLINICAL DATA:  ARDS.  EXAM: PORTABLE CHEST - 1 VIEW  COMPARISON:  06/11/2014.  FINDINGS: Image is rotated. Endotracheal tube, NG tube, and central line in stable position. Heart size stable. No pulmonary venous congestion persistent bilateral airspace disease with atelectatic changes in the left mid lung. Persistent left pleural effusion. No pneumothorax.  IMPRESSION: 1. Lines and tubes in stable position. 2. Image is rotated. Persistent bilateral airspace disease with right mid lung field and left base atelectasis. Persistent left pleural effusion. No interim change.   Electronically Signed   By: Marcello Moores  Register   On: 06/12/2014 07:37   Dg Chest Port 1 View  06/11/2014   CLINICAL DATA:  Respiratory failure.  EXAM: PORTABLE CHEST - 1 VIEW  COMPARISON:  06/10/2014 .  FINDINGS: Endotracheal tube, left IJ line, NG tube in stable position. Mediastinum hilar structures stable. Heart size stable. Persistent diffuse bilateral airspace disease. Atelectatic changes in the right mid lung. Persistent left pleural effusion. No pneumothorax. No acute osseous abnormality.  IMPRESSION: 1. Lines and tubes in stable position. 2. Persistent bilateral airspace disease with prominent atelectatic changes in the right mid lung. Persistent left pleural effusion.   Electronically Signed   By: Marcello Moores  Register   On: 06/11/2014 07:13    ASSESSMENT / PLAN:  PULMONARY OETT  06/04/2014 >> A:  Acute Respiratory failure ARDS P:   ARDS Net protocol for vent - Vt goal 6cc/kg/IBW - believe we can now liberalize to 8cc/kg based on improved CXR, improved gas exchange, desire to decrease sedation. She has not required any further paralytic  ROSE NIH PETAL NETWORK TRIAL - on std of care sedation arm  - refer to Page 24 - bedside protocol for precise  criteria and loop out  - Planned Diuresis per Trial Protocol,  see CVS section  Should be in position to start PSV / SBT 4/7   CARDIOVASCULAR CVL L IJ 3/30>> A:  CAD, Hx of MI s/p stents in 1980s during age 59s Shock, presumed septic shock due to peritonitis Cool L UE, still has pulses but concerning for clot or ischemia; doppler negative arterial and venous P:  Vasopressors as needed for MAP > 65  Diuresis >/=  12h after patient off vasopressors   - CVP goal 4 per Trial protocol  - diuresis is nursing driven per [place] bedside flow chart and will be weighted by CVP, MAP and Urine Output Note hypernatremia, will need to hold off diuresis and give some free water  RENAL A:   Intact but at risk for AKI Hypokalemia in setting diuretics Hypophos Hypernatremia P:   Follow BMP Replace electrolytes as indicated Start free water on 4/7  GASTROINTESTINAL A:   Crohn's disease, chronic immunosuppresion Perforation s/p surgical resection Peritonitis  P:   Appreciate CCS and GI recs Tolerating TF Wound vac being changed MWF  HEMATOLOGIC A:   At risk for anemia of critical illness P:  PRBC per ICU guidelines  INFECTIOUS BCx2 >> negative UC >> negative Tracheal aspirate for bacteria and virus 06/04/14 >> few candida albicans RVP >> negative A:   Baseline  - Immunocmpromised patient due to humira + prednisone Current   - admit with bowel perf / peritonitis 06/02/14  - pulmonary event -06/04/14 ARDS - trach aspirate with few candida P:   Zosyn 3/29>> (plan 10-14 days) Vanc 3/31 >> 4/6 Micafungin 06/06/14 (candida in BAL, peritonitis) >>  Check Pct 4/7, a negative result would influence timing on d/c zosyn   ENDOCRINE A:   Chronic pred for Crohn's P:   Stress dose hydrocort, at risk for adrenal insufficiency; decreased 4/3 SSI  NEUROLOGIC A:   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    Independent critical care time is 35 minutes.   Baltazar Apo, MD, PhD 06/12/2014, 12:31 PM [facility] [phone] or if no answer [phone]

## 2014-06-12 NOTE — Progress Notes (Signed)
eLink Physician-Brief Progress Note Patient Name: NELISSA BOLDUC DOB: 08/29/1955 MRN: [id number]   Date of Service  06/12/2014  HPI/Events of Note  K and Phos low  eICU Interventions  Kphos now     Intervention Category Minor Interventions: Electrolytes abnormality - evaluation and management  MCQUAID, DOUGLAS 06/12/2014, 6:21 AM

## 2014-06-12 NOTE — Progress Notes (Signed)
Ur review performed.

## 2014-06-12 NOTE — Progress Notes (Signed)
NUTRITION FOLLOW UP  Intervention:   Continue Vital AF 1.2 @ 30 ml/hr.    30 ml Prostat BID  Tube feeding regimen + current Propofol infusion provides 1505 kcal (100% of estimated needs), 84 grams of protein, and 584 ml of H2O.   RD to continue to monitor   Nutrition Dx:   Inadequate oral intake related to inability to eat as evidenced by NPO. ongoing  Goal:   Pt to meet >/= 90% of their estimated energy requirements. Met with TF and calories from propofol  Monitor:   TF regimen & tolerance, respiratory status, weight, labs, I/O's  Assessment:   28 yoF admitted with colonic bowel perforation. S/p ex lap with partial colectomy 3/28. In ICU with postop respiratory failure on 3/30, requiring emergent intubation. CXR showed bilateral infiltrates suggestive of acute pulmonary edema. WBC elevated and patient is febrile. Patient has a long history of Crohn's disease with at least 2 previous partial colectomies and left-sided ostomy.  - Per chart, pt tolerating TF at goal rate with 0 ml residuals.  - Pt with wound vac  Patient is currently intubated on ventilator support MV: 9.7 L/min Temp (24hrs), Avg:99.5 F (37.5 C), Min:97.9 F (36.6 C), Max:100.5 F (38.1 C)  Propofol: 16.7 ml/hr providing additional 441 kcal  Labs reviewed:  Low K Elevated Na, BUN Low Phos Mg WNL Phos and Mg WNL  Height: Ht Readings from Last 1 Encounters:  06/04/14 5' (1.524 m)    Weight Status:   Wt Readings from Last 1 Encounters:  06/12/14 106 lb 14.8 oz (48.5 kg)   Body mass index is 20.88 kg/(m^2).  Re-estimated needs:  Kcal: 1350-1500 kcal Protein: 85 - 95g  Fluid: per MD  Skin: incision on abdomen- wound vac in place  Diet Order: Diet NPO time specified   Intake/Output Summary (Last 24 hours) at 06/12/14 1059 Last data filed at 06/12/14 1014  Gross per 24 hour  Intake 1949.05 ml  Output   2975 ml  Net -1025.95 ml    Last BM: 4/6 (125 ml via ostomy  bag)   Labs:   Recent Labs Lab 06/09/14 0520 06/10/14 0300 06/10/14 1640 06/11/14 0500 06/12/14 0420  NA 144 146* 147* 147* 150*  K 2.7* 2.1* 2.5* 3.8 3.3*  CL 108 95* 92* 98 101  CO2 28 39* 46* 43* 40*  BUN 26* 28* 27* 29* 28*  CREATININE 0.82 0.82 0.79 0.74 0.68  CALCIUM 8.0* 7.9* 7.6* 7.9* 8.1*  MG 2.0 1.6  --   --  1.9  PHOS 3.4 3.5  --   --  2.2*  GLUCOSE 145* 126* 137* 146* 107*    CBG (last 3)   Recent Labs  06/11/14 1919 06/12/14 0040 06/12/14 0517  GLUCAP 114* 94 108*    Scheduled Meds: . antiseptic oral rinse  7 mL Mouth Rinse QID  . aspirin  81 mg Per NG tube Daily  . chlorhexidine  15 mL Mouth Rinse BID  . feeding supplement (PRO-STAT SUGAR FREE 64)  30 mL Oral BID  . feeding supplement (VITAL AF 1.2 CAL)  1,000 mL Per Tube Q24H  . furosemide  20 mg Intravenous Once  . heparin subcutaneous  5,000 Units Subcutaneous 3 times per day  . hydrocortisone sod succinate (SOLU-CORTEF) inj  25 mg Intravenous Daily  . insulin aspart  2-6 Units Subcutaneous 6 times per day  . micafungin [facility]) IV  100 mg Intravenous Daily  . pantoprazole (PROTONIX) IV  40 mg Intravenous Q24H  .  piperacillin-tazobactam (ZOSYN)  IV  3.375 g Intravenous Q8H  . potassium chloride  40 mEq Per Tube BID  . potassium phosphate IVPB (mmol)  30 mmol Intravenous Once    Continuous Infusions: . dextrose 5 % and 0.45% NaCl Stopped (06/11/14 0500)  . fentaNYL infusion INTRAVENOUS 275 mcg/hr (06/12/14 1014)  . propofol 50 mcg/kg/min (06/12/14 1015)   Clayton Bibles, MS, RD, LDN Pager: [phone] After Hours Pager: [phone]

## 2014-06-12 NOTE — Progress Notes (Signed)
[facility] Progress Note  10 Days Post-Op  Subjective: Intubated and sedated.  Minimally arousable, but does not follow commands.    Objective: Vital signs in last 24 hours: Temp:  [97.7 F (36.5 C)-100.5 F (38.1 C)] 98.5 F (36.9 C) (04/07 0650) Pulse Rate:  [61-125] 69 (04/07 0500) Resp:  [17-48] 25 (04/07 0300) BP: (128-193)/(53-83) 152/61 mmHg (04/07 0300) SpO2:  [89 %-99 %] 94 % (04/07 0500) Arterial Line BP: (97-207)/(38-91) 106/40 mmHg (04/07 0500) FiO2 (%):  [40 %] 40 % (04/07 0300) Weight:  [48.5 kg (106 lb 14.8 oz)] 48.5 kg (106 lb 14.8 oz) (04/07 0650) Last BM Date: 06/11/14  Intake/Output from previous day: 04/06 0701 - 04/07 0700 In: 2023.2 [I.V.:719.7; NG/GT:1003.5; IV Piggyback:300] Out: 2130 [Urine:3050; Stool:125] Intake/Output this shift:    PE: Gen:  Alert, NAD, pleasant Card:  RRR, no M/G/R heard Pulm:  On Vent Abd: Soft, NT/ND, few BS, no HSM, midline wound with wound vac in place, ostomy pink with flatus in bag, TF going at 68mL/hr Ext:  No erythema, edema, or tenderness, pulses not palpable b/l  Lab Results:   Recent Labs  06/11/14 0500 06/12/14 0420  WBC 17.3* 17.3*  HGB 8.5* 8.0*  HCT 27.5* 26.5*  PLT 406* 439*   BMET  Recent Labs  06/11/14 0500 06/12/14 0420  NA 147* 150*  K 3.8 3.3*  CL 98 101  CO2 43* 40*  GLUCOSE 146* 107*  BUN 29* 28*  CREATININE 0.74 0.68  CALCIUM 7.9* 8.1*   PT/INR No results for input(s): LABPROT, INR in the last 72 hours. CMP     Component Value Date/Time   NA 150* 06/12/2014 0420   K 3.3* 06/12/2014 0420   CL 101 06/12/2014 0420   CO2 40* 06/12/2014 0420   GLUCOSE 107* 06/12/2014 0420   BUN 28* 06/12/2014 0420   CREATININE 0.68 06/12/2014 0420   CALCIUM 8.1* 06/12/2014 0420   PROT 5.3* 06/04/2014 1030   ALBUMIN 2.3* 06/04/2014 1030   AST 62* 06/04/2014 1030   ALT 18 06/04/2014 1030   ALKPHOS 50 06/04/2014 1030   BILITOT 0.7 06/04/2014 1030   GFRNONAA >90 06/12/2014 0420    GFRAA >90 06/12/2014 0420   Lipase  No results found for: LIPASE     Studies/Results: Dg Chest Port 1 View  06/12/2014   CLINICAL DATA:  ARDS.  EXAM: PORTABLE CHEST - 1 VIEW  COMPARISON:  06/11/2014.  FINDINGS: Image is rotated. Endotracheal tube, NG tube, and central line in stable position. Heart size stable. No pulmonary venous congestion persistent bilateral airspace disease with atelectatic changes in the left mid lung. Persistent left pleural effusion. No pneumothorax.  IMPRESSION: 1. Lines and tubes in stable position. 2. Image is rotated. Persistent bilateral airspace disease with right mid lung field and left base atelectasis. Persistent left pleural effusion. No interim change.   Electronically Signed   By: Marcello Moores  Register   On: 06/12/2014 07:37   Dg Chest Port 1 View  06/11/2014   CLINICAL DATA:  Respiratory failure.  EXAM: PORTABLE CHEST - 1 VIEW  COMPARISON:  06/10/2014 .  FINDINGS: Endotracheal tube, left IJ line, NG tube in stable position. Mediastinum hilar structures stable. Heart size stable. Persistent diffuse bilateral airspace disease. Atelectatic changes in the right mid lung. Persistent left pleural effusion. No pneumothorax. No acute osseous abnormality.  IMPRESSION: 1. Lines and tubes in stable position. 2. Persistent bilateral airspace disease with prominent atelectatic changes in the right mid lung. Persistent left pleural  effusion.   Electronically Signed   By: Marcello Moores  Register   On: 06/11/2014 07:13    Anti-infectives: Anti-infectives    Start     Dose/Rate Route Frequency Ordered Stop   06/06/14 1200  micafungin (MYCAMINE) 100 mg in sodium chloride 0.9 % 100 mL IVPB     100 mg 100 mL/hr over 1 Hours Intravenous Daily 06/06/14 0944     06/05/14 1000  vancomycin (VANCOCIN) 500 mg in sodium chloride 0.9 % 100 mL IVPB  Status:  Discontinued     500 mg 100 mL/hr over 60 Minutes Intravenous Every 12 hours 06/05/14 0839 06/11/14 1048   06/04/14 1400  vancomycin  (VANCOCIN) IVPB 1000 mg/200 mL premix  Status:  Discontinued     1,000 mg 200 mL/hr over 60 Minutes Intravenous Every 24 hours 06/04/14 1313 06/05/14 0839   06/03/14 0200  piperacillin-tazobactam (ZOSYN) IVPB 3.375 g     3.375 g 12.5 mL/hr over 240 Minutes Intravenous Every 8 hours 06/02/14 2346     06/02/14 1845  piperacillin-tazobactam (ZOSYN) IVPB 3.375 g     3.375 g 12.5 mL/hr over 240 Minutes Intravenous  Once 06/02/14 1833 06/02/14 2257     SIGNIFICANT EVENTS: 3/28 Admit 3/30 PCCM consult for abrupt resp decline 3/31 ARDS NET + Start NIH ROSE Trial (Re-evaluaton Of Systemic Early NMB) - randomized to sedation only Arm  4/03 LUE arterial and venous dopplers >> negative 4/05 Weaned to 0.40, PEEP 8  Antibiotics: Zosyn 3/29>> (plan 10-14 days) Vanc 3/31 >> 4/6 Micafungin 06/06/14 (candida in BAL, peritonitis) >>  Assessment/Plan Colonic perforation & Crohns disease POD #10 s/p Ex Lap, partial colectomy/colostomy  -Suspected secondary to IBD immunosuppressed on humira, prednisone, and sulfasalazine Continue tube feeds  -Intubated and sedated -Continue abx. WBC creeping back up. Doubt abdominal source but if there is any question we could scan her  -Dressing changes BID -Tube feeds currently at 44mL/hr -Wound vac MWF, WOC following  Septic shock from peritonitis Acute respiratory failure -ARDS per CCM H/o MI with stent Tobacco use VTE Proph - heparin and SCD's   LOS: 10 days    [name], [name] 06/12/2014, 7:58 AM Pager: [phone]

## 2014-06-12 NOTE — Progress Notes (Signed)
Spoke twice with Erline Levine RN with ROSE study to inform her of patients values.  No lasix needed for 2000 time and lasix not given at midnight.  CVP 7 and MAP 59 with greater than average UOP.  Will reassess patient status at 0400.

## 2014-06-12 NOTE — Progress Notes (Signed)
CO2 - 40 critical called to [place].  Also noted potassium and phos low with replacement ordered per elink.

## 2014-06-13 ENCOUNTER — Inpatient Hospital Stay (HOSPITAL_COMMUNITY): Payer: Commercial Managed Care - HMO

## 2014-06-13 LAB — BASIC METABOLIC PANEL
ANION GAP: 9 (ref 5–15)
BUN: 33 mg/dL — AB (ref 6–23)
CHLORIDE: 105 mmol/L (ref 96–112)
CO2: 34 mmol/L — ABNORMAL HIGH (ref 19–32)
CREATININE: 0.71 mg/dL (ref 0.50–1.10)
Calcium: 7.8 mg/dL — ABNORMAL LOW (ref 8.4–10.5)
GFR calc non Af Amer: >90 mL/min (ref >90)
Glucose, Bld: 118 mg/dL — ABNORMAL HIGH (ref 70–99)
POTASSIUM: 3.9 mmol/L (ref 3.5–5.1)
Sodium: 148 mmol/L — ABNORMAL HIGH (ref 135–145)

## 2014-06-13 LAB — GLUCOSE, CAPILLARY
GLUCOSE-CAPILLARY: 94 mg/dL (ref 70–99)
Glucose-Capillary: 122 mg/dL — ABNORMAL HIGH (ref 70–99)
Glucose-Capillary: 135 mg/dL — ABNORMAL HIGH (ref 70–99)
Glucose-Capillary: 112 mg/dL — ABNORMAL HIGH (ref 70–99)
Glucose-Capillary: 114 mg/dL — ABNORMAL HIGH (ref 70–99)
Glucose-Capillary: 177 mg/dL — ABNORMAL HIGH (ref 70–99)
Glucose-Capillary: 135 mg/dL — ABNORMAL HIGH (ref 70–99)
Glucose-Capillary: 118 mg/dL — ABNORMAL HIGH (ref 70–99)

## 2014-06-13 LAB — PROCALCITONIN: Procalcitonin: 0.66 ng/mL

## 2014-06-13 MED ORDER — CLONAZEPAM 0.5 MG PO TABS
0.5000 mg | ORAL_TABLET | Freq: Two times a day (BID) | ORAL | Status: DC
  Administered 2014-06-13 – 2014-06-17 (×9): 0.5 mg
  Filled 2014-06-13 (×9): qty 1

## 2014-06-13 MED ORDER — RISPERIDONE 1 MG/ML PO SOLN
0.5000 mg | Freq: Two times a day (BID) | ORAL | Status: DC
  Administered 2014-06-13 – 2014-06-17 (×9): 0.5 mg
  Filled 2014-06-13 (×12): qty 0.5

## 2014-06-13 MED ORDER — LIP MEDEX EX OINT
TOPICAL_OINTMENT | CUTANEOUS | Status: AC
  Administered 2014-06-13: 1
  Filled 2014-06-13: qty 7

## 2014-06-13 MED ORDER — FREE WATER
200.0000 mL | Freq: Four times a day (QID) | Status: DC
  Administered 2014-06-13 – 2014-06-18 (×20): 200 mL

## 2014-06-13 MED ORDER — VITAL AF 1.2 CAL PO LIQD
1000.0000 mL | ORAL | Status: DC
  Administered 2014-06-13 – 2014-06-16 (×4): 1000 mL
  Filled 2014-06-13 (×7): qty 1000

## 2014-06-13 MED ORDER — DEXMEDETOMIDINE HCL IN NACL 200 MCG/50ML IV SOLN
0.0000 ug/kg/h | INTRAVENOUS | Status: DC
  Administered 2014-06-13: 0.4 ug/kg/h via INTRAVENOUS
  Administered 2014-06-13: 0.8 ug/kg/h via INTRAVENOUS
  Administered 2014-06-13: 1 ug/kg/h via INTRAVENOUS
  Administered 2014-06-13: 1.2 ug/kg/h via INTRAVENOUS
  Administered 2014-06-13 – 2014-06-14 (×5): 0.8 ug/kg/h via INTRAVENOUS
  Administered 2014-06-15: 0.6 ug/kg/h via INTRAVENOUS
  Administered 2014-06-15: 0.803 ug/kg/h via INTRAVENOUS
  Administered 2014-06-15 (×2): 0.8 ug/kg/h via INTRAVENOUS
  Administered 2014-06-15 – 2014-06-16 (×2): 1 ug/kg/h via INTRAVENOUS
  Filled 2014-06-13 (×7): qty 50
  Filled 2014-06-13: qty 100
  Filled 2014-06-13 (×8): qty 50

## 2014-06-13 NOTE — Progress Notes (Addendum)
ANTIBIOTIC CONSULT NOTE - FOLLOW UP  Pharmacy Consult for Zosyn and Micfungin Indication: Peritonitis, Pneumonia, and Trach aspirate candidiasis in an immunocompromised patient  Allergies  Allergen Reactions  . Remicade [Infliximab]     convulsions    Patient Measurements: Height: 5' (152.4 cm) Weight: 108 lb 11 oz (49.3 kg) IBW/kg (Calculated) : 45.5 Adjusted Body Weight:   Vital Signs: Temp: 100 F (37.8 C) (04/08 0344) Temp Source: Axillary (04/08 0344) Pulse Rate: 72 (04/08 0700) Intake/Output from previous day: 04/07 0701 - 04/08 0700 In: 2691.5 [I.V.:1286; NG/GT:900; IV Piggyback:505.5] Out: 1856 [Urine:1210; Drains:50; Stool:475] Intake/Output from this shift: Total I/O In: 106.3 [I.V.:76.3; NG/GT:30] Out: -   Labs:  Recent Labs  06/11/14 0500 06/12/14 0420 06/13/14 0350  WBC 17.3* 17.3*  --   HGB 8.5* 8.0*  --   PLT 406* 439*  --   CREATININE 0.74 0.68 0.71   Estimated Creatinine Clearance: 54.4 mL/min (by C-G formula based on Cr of 0.71). No results for input(s): VANCOTROUGH, VANCOPEAK, VANCORANDOM, GENTTROUGH, GENTPEAK, GENTRANDOM, TOBRATROUGH, TOBRAPEAK, TOBRARND, AMIKACINPEAK, AMIKACINTROU, AMIKACIN in the last 72 hours.   Microbiology: Recent Results (from the past 720 hour(s))  MRSA PCR Screening     Status: None   Collection Time: 06/02/14 11:50 PM  Result Value Ref Range Status   MRSA by PCR NEGATIVE NEGATIVE Final    Comment:        The GeneXpert MRSA Assay (FDA approved for NASAL specimens only), is one component of a comprehensive MRSA colonization surveillance program. It is not intended to diagnose MRSA infection nor to guide or monitor treatment for MRSA infections.   Culture, respiratory (NON-Expectorated)     Status: None   Collection Time: 06/04/14 10:11 AM  Result Value Ref Range Status   Specimen Description TRACHEAL ASPIRATE  Final   Special Requests Immunocompromised  Final   Gram Stain   Final    RARE WBC PRESENT,  PREDOMINANTLY MONONUCLEAR RARE SQUAMOUS EPITHELIAL CELLS PRESENT NO ORGANISMS SEEN Performed at [facility]    Culture   Final    FEW CANDIDA ALBICANS Performed at [facility]    Report Status 06/06/2014 FINAL  Final  Culture, Urine     Status: None   Collection Time: 06/04/14 10:20 AM  Result Value Ref Range Status   Specimen Description URINE, CATHETERIZED  Final   Special Requests Immunocompromised  Final   Colony Count NO GROWTH Performed at [facility]   Final   Culture NO GROWTH Performed at [facility]   Final   Report Status 06/07/2014 FINAL  Final  Culture, blood (routine x 2)     Status: None   Collection Time: 06/04/14 10:30 AM  Result Value Ref Range Status   Specimen Description BLOOD LEFT HAND  Final   Special Requests BOTTLES DRAWN AEROBIC ONLY 2CC  Final   Culture   Final    NO GROWTH 5 DAYS Performed at [facility]    Report Status 06/10/2014 FINAL  Final  Culture, blood (routine x 2)     Status: None   Collection Time: 06/04/14 10:40 AM  Result Value Ref Range Status   Specimen Description BLOOD RIGHT ARM  Final   Special Requests BOTTLES DRAWN AEROBIC ONLY [place]  Final   Culture   Final    NO GROWTH 5 DAYS Performed at [facility]    Report Status 06/10/2014 FINAL  Final  Respiratory virus panel     Status: None  Collection Time: 06/04/14 11:03 AM  Result Value Ref Range Status   Respiratory Syncytial Virus A Negative Negative Final   Respiratory Syncytial Virus B Negative Negative Final   Influenza A Negative Negative Final   Influenza B Negative Negative Final   Parainfluenza 1 Negative Negative Final   Parainfluenza 2 Negative Negative Final   Parainfluenza 3 Negative Negative Final   Metapneumovirus Negative Negative Final   Rhinovirus Negative Negative Final   Adenovirus Negative Negative Final    Comment: (NOTE) Performed At: [facility] [place], [place]  [id number] Lindon Romp MD HE:[id number]     Anti-infectives    Start     Dose/Rate Route Frequency Ordered Stop   06/06/14 1200  micafungin (MYCAMINE) 100 mg in sodium chloride 0.9 % 100 mL IVPB     100 mg 100 mL/hr over 1 Hours Intravenous Daily 06/06/14 0944     06/05/14 1000  vancomycin (VANCOCIN) 500 mg in sodium chloride 0.9 % 100 mL IVPB  Status:  Discontinued     500 mg 100 mL/hr over 60 Minutes Intravenous Every 12 hours 06/05/14 0839 06/11/14 1048   06/04/14 1400  vancomycin (VANCOCIN) IVPB 1000 mg/200 mL premix  Status:  Discontinued     1,000 mg 200 mL/hr over 60 Minutes Intravenous Every 24 hours 06/04/14 1313 06/05/14 0839   06/03/14 0200  piperacillin-tazobactam (ZOSYN) IVPB 3.375 g     3.375 g 12.5 mL/hr over 240 Minutes Intravenous Every 8 hours 06/02/14 2346     06/02/14 1845  piperacillin-tazobactam (ZOSYN) IVPB 3.375 g     3.375 g 12.5 mL/hr over 240 Minutes Intravenous  Once 06/02/14 1833 06/02/14 2257      Assessment: 58 YOF presented 06/02/14 due to diffuse abdominal pain & tachycardia. Patient has a history of Crohn's disease s/p 2 partial colectomies & left-sided ostomy. S/p colonoscopy that found stricture near the ostomy w/ concern for tear of the colon, which was found to be an acute bowel perforation and was taken directly to the OR with the creation of new colostomy. On 06/04/14 the patient went into ARDs and was intubation. NOTE: Patient is a participant in the ROSE trial- Re-evaluation of Systemic Early NMB (sedation arm)  Today, 06/13/14 is Day 12 of total antibitiocs, Day 12 of extended infusion Zosyn per MD (planned 14 day course) & Day 8 micafungin 100mg  q24 for peritonitis, pneumonia & trach aspirate candidiasis in an immunocompromised patient. Since renal function remains unchanged today, antibiotic dosing remains appropriate.  3/28 zosyn>> 3/30 vanc>>4/6 4/1 mica>>  Levels/dose adjustments: 3/31 vanc 1g q24h>>500mg  q12h  4/2 2100 = VT 15.5,  500 q12h>>no change 4/8 no change in renal function>>continue Abx dosing  3/28 MRSA PCR>>neg 3/30 TA Cx>>CA 3/30 BCx>>neg 3/30 UCx>>neg 3/30 RVP>>neg  Today, 06/13/14:  Tmax: 101.2, currently febrile at 100  WBCs: remains elevated and stable at 17.3 as of CBC on 06/12/14  Renal: SCr 0.71 with CrCl~54 mL/min (normalized >192mL/min)  Goal of Therapy:  Appropriate antibiotic dosing Eradication of infection  Plan:  Continue Zosyn 3.375g IV q8h Continue micafungin 100mg  IV daily Follow up renal function, culture results, and clinical course/improvement Medications are appropriate per renal function, pharmacy will follow at distance, please re-consult if needed  Gloriajean Dell, PharmD Candidate  --------------------------------------------------- I have read and agree with the above findings and plan of care.  Continued leukocytosis and temps, both of which could be due to other causes (steroids, Zosyn), but may need to consider new cultures if  clinically suspecting persistent infection.  Reuel Boom, PharmD Pager: [phone] 06/13/2014, 8:51 AM

## 2014-06-13 NOTE — Progress Notes (Signed)
[facility] Progress Note  11 Days Post-Op  Subjective: More purposeful today, but does not follow commands.  Very alert as sedation is weaning.  Trying to wean from the  Vent.  Still req vent support.  TF at 30mL/hr, will start advancing this.  Objective: Vital signs in last 24 hours: Temp:  [98.2 F (36.8 C)-101.2 F (38.4 C)] 100 F (37.8 C) (04/08 0344) Pulse Rate:  [42-86] 72 (04/08 0700) Resp:  [20] 20 (04/07 1941) BP: (151-154)/(58-62) 151/62 mmHg (04/07 1202) SpO2:  [91 %-96 %] 92 % (04/08 0700) Arterial Line BP: (87-166)/(32-67) 89/36 mmHg (04/08 0700) FiO2 (%):  [30 %-40 %] 30 % (04/08 0700) Weight:  [49.3 kg (108 lb 11 oz)] 49.3 kg (108 lb 11 oz) (04/08 0600) Last BM Date: 06/12/14  Intake/Output from previous day: 04/07 0701 - 04/08 0700 In: 2691.5 [I.V.:1286; NG/GT:900; IV Piggyback:505.5] Out: 1735 [Urine:1210; Drains:50; Stool:475] Intake/Output this shift: Total I/O In: 51.2 [I.V.:51.2] Out: -   PE: Gen: Alert, NAD, pleasant Card: RRR, no M/G/R heard Pulm: On Vent Abd: Soft, NT/ND, good BS, no HSM, midline wound with wound vac in place, ostomy pink with flatus and yellow brown stools in bag, TF going at 33mL/hr. Ext: Moves all 4 extremities  Lab Results:   Recent Labs  06/11/14 0500 06/12/14 0420  WBC 17.3* 17.3*  HGB 8.5* 8.0*  HCT 27.5* 26.5*  PLT 406* 439*   BMET  Recent Labs  06/12/14 0420 06/13/14 0350  NA 150* 148*  K 3.3* 3.9  CL 101 105  CO2 40* 34*  GLUCOSE 107* 118*  BUN 28* 33*  CREATININE 0.68 0.71  CALCIUM 8.1* 7.8*   PT/INR No results for input(s): LABPROT, INR in the last 72 hours. CMP     Component Value Date/Time   NA 148* 06/13/2014 0350   K 3.9 06/13/2014 0350   CL 105 06/13/2014 0350   CO2 34* 06/13/2014 0350   GLUCOSE 118* 06/13/2014 0350   BUN 33* 06/13/2014 0350   CREATININE 0.71 06/13/2014 0350   CALCIUM 7.8* 06/13/2014 0350   PROT 5.3* 06/04/2014 1030   ALBUMIN 2.3* 06/04/2014 1030    AST 62* 06/04/2014 1030   ALT 18 06/04/2014 1030   ALKPHOS 50 06/04/2014 1030   BILITOT 0.7 06/04/2014 1030   GFRNONAA >90 06/13/2014 0350   GFRAA >90 06/13/2014 0350   Lipase  No results found for: LIPASE     Studies/Results: Dg Chest Port 1 View  06/12/2014   CLINICAL DATA:  ARDS.  EXAM: PORTABLE CHEST - 1 VIEW  COMPARISON:  06/11/2014.  FINDINGS: Image is rotated. Endotracheal tube, NG tube, and central line in stable position. Heart size stable. No pulmonary venous congestion persistent bilateral airspace disease with atelectatic changes in the left mid lung. Persistent left pleural effusion. No pneumothorax.  IMPRESSION: 1. Lines and tubes in stable position. 2. Image is rotated. Persistent bilateral airspace disease with right mid lung field and left base atelectasis. Persistent left pleural effusion. No interim change.   Electronically Signed   By: Marcello Moores  Register   On: 06/12/2014 07:37    Anti-infectives: Anti-infectives    Start     Dose/Rate Route Frequency Ordered Stop   06/06/14 1200  micafungin (MYCAMINE) 100 mg in sodium chloride 0.9 % 100 mL IVPB     100 mg 100 mL/hr over 1 Hours Intravenous Daily 06/06/14 0944     06/05/14 1000  vancomycin (VANCOCIN) 500 mg in sodium chloride 0.9 % 100 mL IVPB  Status:  Discontinued     500 mg 100 mL/hr over 60 Minutes Intravenous Every 12 hours 06/05/14 0839 06/11/14 1048   06/04/14 1400  vancomycin (VANCOCIN) IVPB 1000 mg/200 mL premix  Status:  Discontinued     1,000 mg 200 mL/hr over 60 Minutes Intravenous Every 24 hours 06/04/14 1313 06/05/14 0839   06/03/14 0200  piperacillin-tazobactam (ZOSYN) IVPB 3.375 g     3.375 g 12.5 mL/hr over 240 Minutes Intravenous Every 8 hours 06/02/14 2346     06/02/14 1845  piperacillin-tazobactam (ZOSYN) IVPB 3.375 g     3.375 g 12.5 mL/hr over 240 Minutes Intravenous  Once 06/02/14 1833 06/02/14 2257     SIGNIFICANT EVENTS: 3/28 Admit 3/30 PCCM consult for abrupt resp decline 3/31  ARDS NET + Start NIH ROSE Trial (Re-evaluaton Of Systemic Early NMB) - randomized to sedation only Arm  4/03 LUE arterial and venous dopplers >> negative 4/05 Weaned to 0.40, PEEP 8 4/07  TF at 59mL/hr, start to advance TF to goal  Assessment/Plan Colonic perforation & Crohns disease POD #11 s/p Ex Lap, partial colectomy/colostomy  -Suspected secondary to IBD immunosuppressed on humira, prednisone, and sulfasalazine -Intubated and sedated -Continue abx. WBC pending but was 17.3 last 2 days, Abdomen seems benign - NT/ND -Wound vac was changed on Thursday instead of Friday.  I want to get her back on MWF changes so we will have it changed on Saturday, then resume normal M/W/F.  WOC following PCM/inability to take PO on tube feedings -Tube feeds currently at 62mL/hr, advance to goal, order pre-albumin, dietitian consult, check residuals Septic shock from peritonitis Acute respiratory failure -ARDS per CCM H/o MI with stent Tobacco use VTE Proph - heparin and SCD's    LOS: 11 days    [name], [name] 06/13/2014, 7:17 AM Pager: [phone]

## 2014-06-13 NOTE — Consult Note (Signed)
WOC ostomy follow up Stoma type/location: LLQ, end colostomy Stomal assessment/size: pouch changed by nursing student prior to my arrival  Output liquid brown, green effluent, flatus Ostomy pouching: 2pc.2 1/4" in place and intact, placed over NPWT drape when changed Education provided: Pt on vent, agitated, restless at the time of my visit Enrolled patient in [address] Start Discharge program:No  NPWT VAC dressing also changed per nursing staff/student this am at 5am. Dated both pouch and ostomy  [place] team will follow along with you for support with ostomy and wound care.  Danil Wedge [place] RN,CWOCN [phone]

## 2014-06-13 NOTE — Progress Notes (Signed)
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 Prednisone and sulfasalazine. Admitted 3/28 with colonic bowel perforation and s/p ex-lap with partial colectomy / resection of colostomy & creation of new colostomy 3/28.  On 3/30, she had sudden respiratory decompensation requiring emergent intubation.  Clear CXR on admit.  Post intubation CXR c/w pulmonary edema.  Progressed to ARDS 3/31.     SIGNIFICANT EVENTS: 3/28  Admit 3/30  PCCM consult for abrupt resp decline 3/31  ARDS NET + Start  NIH ROSE Trial (Re-evaluaton Of Systemic Early NMB) - randomized to sedation only Arm  4/03  LUE arterial and venous dopplers >> negative 4/05  Weaned to 0.40, PEEP 8 4/06  Vt liberalized to 8cc/kg  SUBJECTIVE:  Sedation lightened this am, tachypneic on PSV  VITAL SIGNS: Temp:  [98.2 F (36.8 C)-101.9 F (38.8 C)] 101.9 F (38.8 C) (04/08 0800) Pulse Rate:  [61-120] 120 (04/08 1000) Resp:  [20] 20 (04/07 1941) BP: (151)/(62) 151/62 mmHg (04/07 1202) SpO2:  [89 %-95 %] 95 % (04/08 1000) Arterial Line BP: (87-183)/(32-70) 167/70 mmHg (04/08 1000) FiO2 (%):  [30 %-40 %] 40 % (04/08 1000) Weight:  [49.3 kg (108 lb 11 oz)] 49.3 kg (108 lb 11 oz) (04/08 0600)   HEMODYNAMICS: CVP:  [6 mmHg-13 mmHg] 12 mmHg   VENTILATOR SETTINGS: Vent Mode:  [-] PRVC FiO2 (%):  [30 %-40 %] 40 % Set Rate:  [20 bmp] 20 bmp Vt Set:  [360 mL] 360 mL PEEP:  [5 cmH20] 5 cmH20 Plateau Pressure:  [18 cmH20-21 cmH20] 21 cmH20   INTAKE / OUTPUT:  Intake/Output Summary (Last 24 hours) at 06/13/14 1013 Last data filed at 06/13/14 0751  Gross per  24 hour  Intake 2297.52 ml  Output   1695 ml  Net 602.52 ml    PHYSICAL EXAMINATION: General:  Ill appearing woman, on MV Neuro:  Eyes open and active, moving UE's and her head, will not track or follow commands (on propofol and fentanyl gtts) HEENT:  ET tube + NGT Cardiovascular:  Tachycardic, Normal heart sounds Lungs:  Decreased bs+, no wheeze Abdomen:  Soft, dressing intact, vac in place Musculoskeletal:  L arm pulses intact Skin:  No defects  LABS:  PULMONARY  Recent Labs Lab 06/07/14 0425 06/11/14 1145 06/11/14 1429 06/11/14 1557 06/12/14 1000  PHART 7.245* 7.493* 7.495* 7.489* 7.481*  PCO2ART 47.5* 56.0* 55.4* 58.8* 53.1*  PO2ART 57.3* 70.6* 92.2 89.5 75.2*  HCO3 19.9* 42.5* 42.3* 44.3* 39.2*  TCO2 19.3 38.1 39.1 40.6 36.9  O2SAT 85.6 92.5 96.3 95.6 94.0   CBC  Recent Labs Lab 06/10/14 0300 06/11/14 0500 06/12/14 0420  HGB 8.9* 8.5* 8.0*  HCT 27.8* 27.5* 26.5*  WBC 16.1* 17.3* 17.3*  PLT 393 406* 439*   COAGULATION No results for input(s): INR in the last 168 hours. CARDIAC No results for input(s): TROPONINI in the last 168 hours. No results for input(s): PROBNP in the last 168 hours.  CHEMISTRY  Recent Labs Lab 06/07/14 0400 06/08/14 0513 06/09/14 0520 06/10/14 0300 06/10/14 1640 06/11/14 0500 06/12/14 0420 06/13/14 0350  NA 138 142  144 146* 147* 147* 150* 148*  K 4.1 3.7 2.7* 2.1* 2.5* 3.8 3.3* 3.9  CL 114* 109 108 95* 92* 98 101 105  CO2 20 25 28  39* 46* 43* 40* 34*  GLUCOSE 120* 107* 145* 126* 137* 146* 107* 118*  BUN 9 15 26* 28* 27* 29* 28* 33*  CREATININE 0.67 0.91 0.82 0.82 0.79 0.74 0.68 0.71  CALCIUM 7.1* 7.9* 8.0* 7.9* 7.6* 7.9* 8.1* 7.8*  MG 2.0 2.0 2.0 1.6  --   --  1.9  --   PHOS 2.2* 4.7* 3.4 3.5  --   --  2.2*  --    Estimated Creatinine Clearance: 54.4 mL/min (by C-G formula based on Cr of 0.71).  LIVER No results for input(s): AST, ALT, ALKPHOS, BILITOT, PROT, ALBUMIN, INR in the last 168  hours. INFECTIOUS  Recent Labs Lab 06/13/14 0350  PROCALCITON 0.66   ENDOCRINE CBG (last 3)   Recent Labs  06/12/14 0517 06/12/14 1631 06/13/14 0814  GLUCAP 108* 121* 114*     IMAGING x48h Dg Chest Port 1 View  06/13/2014   CLINICAL DATA:  ARDS.  EXAM: PORTABLE CHEST - 1 VIEW  COMPARISON:  06/12/2014.  FINDINGS: Endotracheal tube, NG tube, left IJ line stable position. Heart size normal. Persistent bilateral airspace disease noted with atelectatic changes in the right mid lung and left lower lobe. Persistent left pleural effusion. No interim change. No pneumothorax. No acute bony abnormality .  IMPRESSION: 1. Lines and tubes in stable position. 2. Persistent bilateral airspace disease with right mid lung field subsegmental atelectasis and left base prominent atelectatic changes. Persistent left pleural effusion. Chest is unchanged.   Electronically Signed   By: Marcello Moores  Register   On: 06/13/2014 07:23   Dg Chest Port 1 View  06/12/2014   CLINICAL DATA:  ARDS.  EXAM: PORTABLE CHEST - 1 VIEW  COMPARISON:  06/11/2014.  FINDINGS: Image is rotated. Endotracheal tube, NG tube, and central line in stable position. Heart size stable. No pulmonary venous congestion persistent bilateral airspace disease with atelectatic changes in the left mid lung. Persistent left pleural effusion. No pneumothorax.  IMPRESSION: 1. Lines and tubes in stable position. 2. Image is rotated. Persistent bilateral airspace disease with right mid lung field and left base atelectasis. Persistent left pleural effusion. No interim change.   Electronically Signed   By: Marcello Moores  Register   On: 06/12/2014 07:37    ASSESSMENT / PLAN:  PULMONARY OETT  06/04/2014 >> A:  Acute Respiratory failure ARDS P:   ARDS Net protocol for vent - Vt goal 6cc/kg/IBW - believe we can now liberalize to 8cc/kg based on improved CXR, improved gas exchange, desire to decrease sedation. She has not required any further paralytic  ROSE NIH PETAL  NETWORK TRIAL - on std of care sedation arm  - refer to Page 24 - bedside protocol for precise  criteria and loop out  - Planned Diuresis per Trial Protocol, see CVS section  Goal start PSV / SBT 4/8 Her sedation dn pain control needs are a barrier. Will attempt to manage HTN and start enteral sedation as below. She may benefit from trach to facilitate weaning.    CARDIOVASCULAR CVL L IJ 3/30>> A:  CAD, Hx of MI s/p stents in 1980s during age 40s Shock, presumed septic shock due to peritonitis Cool L UE, still has pulses but concerning for clot or ischemia; doppler negative arterial and venous P:  Vasopressors as needed for MAP > 65  Diuresis >/=  12h after patient off vasopressors   - CVP goal 4 per Trial protocol  - diuresis is nursing driven per [place] bedside flow chart and will be weighted by CVP, MAP and Urine Output Note hypernatremia, diuresis held (CVP 6)  RENAL A:   Intact but at risk for AKI Hypokalemia in setting diuretics Hypophos Hypernatremia P:   Follow BMP Replace electrolytes as indicated Continue free water, diuresis held 4/7 (protocol completed, now dosing clinically and by CVP)  GASTROINTESTINAL A:   Crohn's disease, chronic immunosuppresion Perforation s/p surgical resection Peritonitis  P:   Appreciate CCS and GI recs Tolerating TF Wound vac changed 4/7, planning change 4/9 and then transition to MWF  HEMATOLOGIC A:   At risk for anemia of critical illness P:  PRBC per ICU guidelines  INFECTIOUS BCx2 >> negative UC >> negative Tracheal aspirate for bacteria and virus 06/04/14 >> few candida albicans RVP >> negative A:   Baseline  - Immunocompromised patient due to humira + prednisone Current   - admit with bowel perf / peritonitis 06/02/14  - pulmonary event -06/04/14 ARDS - trach aspirate with few candida P:   Zosyn 3/29>> 4/8 Vanc 3/31 >> 4/6 Micafungin 06/06/14 (candida in BAL, peritonitis) >> 4/8  Pct 4/8  0.66 (has dropped  from a peak of 12.31 on 3/30) Currently day 11 abx > will stop 4/8 and follow clinically. If fever, decompensation would restart both zosyn and antifungal coverage.   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    Independent critical care time is 35 minutes.   Baltazar Apo, MD, PhD 06/13/2014, 10:13 AM [facility] [phone] or if no answer [phone]

## 2014-06-13 NOTE — Progress Notes (Signed)
NUTRITION FOLLOW UP  Intervention:   Advance Vital AF 1.2 @ 30 ml/hr by 10 ml every 4 hours to goal rate of 55 ml/hr. D/C Prostat  Tube feeding regimen provides 1584 kcal (98% of estimated needs), 99 grams of protein, and 1071 ml of H2O.   RD to continue to monitor   Nutrition Dx:   Inadequate oral intake related to inability to eat as evidenced by NPO. ongoing  Goal:   Pt to meet >/= 90% of their estimated energy requirements. Met with TF   Monitor:   TF regimen & tolerance, respiratory status, weight, labs, I/O's  Assessment:   66 yoF admitted with colonic bowel perforation. S/p ex lap with partial colectomy 3/28. In ICU with postop respiratory failure on 3/30, requiring emergent intubation. CXR showed bilateral infiltrates suggestive of acute pulmonary edema. WBC elevated and patient is febrile. Patient has a long history of Crohn's disease with at least 2 previous partial colectomies and left-sided ostomy.  -RD consulted to advance TF goal as pt is no longer on Propofol -Per RN, pt tolerating TF well. Notified RN that goal rate would increase and Prostat is d/c  Patient is currently intubated on ventilator support MV: 8.5 L/min Temp (24hrs), Avg:100.1 F (37.8 C), Min:98.2 F (36.8 C), Max:102.2 F (39 C)  Propofol: none  Labs reviewed:  Elevated Na, BUN Low Phos Mg WNL   Height: Ht Readings from Last 1 Encounters:  06/04/14 5' (1.524 m)    Weight Status:   Wt Readings from Last 1 Encounters:  06/13/14 108 lb 11 oz (49.3 kg)   Body mass index is 21.23 kg/(m^2).  Re-estimated needs (4/8):  Kcal: 1616 Protein: 85 - 95g  Fluid: per MD  Skin: incision on abdomen- wound vac in place  Diet Order: Diet NPO time specified   Intake/Output Summary (Last 24 hours) at 06/13/14 1323 Last data filed at 06/13/14 1320  Gross per 24 hour  Intake 2368.74 ml  Output   1790 ml  Net 578.74 ml    Last BM: 4/7 (125 ml via ostomy bag)   Labs:   Recent  Labs Lab 06/09/14 0520 06/10/14 0300  06/11/14 0500 06/12/14 0420 06/13/14 0350  NA 144 146*  < > 147* 150* 148*  K 2.7* 2.1*  < > 3.8 3.3* 3.9  CL 108 95*  < > 98 101 105  CO2 28 39*  < > 43* 40* 34*  BUN 26* 28*  < > 29* 28* 33*  CREATININE 0.82 0.82  < > 0.74 0.68 0.71  CALCIUM 8.0* 7.9*  < > 7.9* 8.1* 7.8*  MG 2.0 1.6  --   --  1.9  --   PHOS 3.4 3.5  --   --  2.2*  --   GLUCOSE 145* 126*  < > 146* 107* 118*  < > = values in this interval not displayed.  CBG (last 3)   Recent Labs  06/12/14 0517 06/12/14 1631 06/13/14 0814  GLUCAP 108* 121* 114*    Scheduled Meds: . antiseptic oral rinse  7 mL Mouth Rinse QID  . aspirin  81 mg Per NG tube Daily  . chlorhexidine  15 mL Mouth Rinse BID  . clonazePAM  0.5 mg Per Tube BID  . feeding supplement (PRO-STAT SUGAR FREE 64)  30 mL Oral BID  . feeding supplement (VITAL AF 1.2 CAL)  1,000 mL Per Tube Q24H  . free water  200 mL Per Tube 4 times per day  .  furosemide  20 mg Intravenous Once  . heparin subcutaneous  5,000 Units Subcutaneous 3 times per day  . hydrocortisone sod succinate (SOLU-CORTEF) inj  25 mg Intravenous Daily  . insulin aspart  2-6 Units Subcutaneous 6 times per day  . pantoprazole (PROTONIX) IV  40 mg Intravenous Q24H  . potassium chloride  40 mEq Per Tube BID  . risperiDONE  0.5 mg Per Tube BID    Continuous Infusions: . dexmedetomidine 1.1 mcg/kg/hr (06/13/14 1320)  . dextrose 5 % and 0.45% NaCl Stopped (06/11/14 0500)  . fentaNYL infusion INTRAVENOUS 250 mcg/hr (06/13/14 1320)   Clayton Bibles, MS, RD, LDN Pager: [phone] After Hours Pager: [phone]

## 2014-06-14 LAB — CBC
HCT: 23.7 % — ABNORMAL LOW (ref 36.0–46.0)
Hemoglobin: 7.2 g/dL — ABNORMAL LOW (ref 12.0–15.0)
MCH: 29.8 pg (ref 26.0–34.0)
MCHC: 30.4 g/dL (ref 30.0–36.0)
MCV: 97.9 fL (ref 78.0–100.0)
PLATELETS: 443 10*3/uL — AB (ref 150–400)
RBC: 2.42 MIL/uL — ABNORMAL LOW (ref 3.87–5.11)
RDW: 14.9 % (ref 11.5–15.5)
WBC: 18.1 10*3/uL — AB (ref 4.0–10.5)

## 2014-06-14 LAB — BASIC METABOLIC PANEL
ANION GAP: 7 (ref 5–15)
BUN: 24 mg/dL — ABNORMAL HIGH (ref 6–23)
CO2: 28 mmol/L (ref 19–32)
Calcium: 8 mg/dL — ABNORMAL LOW (ref 8.4–10.5)
Chloride: 109 mmol/L (ref 96–112)
Creatinine, Ser: 0.6 mg/dL (ref 0.50–1.10)
GFR calc Af Amer: >90 mL/min (ref >90)
GLUCOSE: 131 mg/dL — AB (ref 70–99)
Potassium: 3.9 mmol/L (ref 3.5–5.1)
SODIUM: 144 mmol/L (ref 135–145)

## 2014-06-14 LAB — GLUCOSE, CAPILLARY
GLUCOSE-CAPILLARY: 137 mg/dL — AB (ref 70–99)
GLUCOSE-CAPILLARY: 120 mg/dL — AB (ref 70–99)
Glucose-Capillary: 117 mg/dL — ABNORMAL HIGH (ref 70–99)
Glucose-Capillary: 124 mg/dL — ABNORMAL HIGH (ref 70–99)
Glucose-Capillary: 134 mg/dL — ABNORMAL HIGH (ref 70–99)
Glucose-Capillary: 142 mg/dL — ABNORMAL HIGH (ref 70–99)

## 2014-06-14 LAB — PROCALCITONIN: Procalcitonin: 0.48 ng/mL

## 2014-06-14 LAB — PREALBUMIN: Prealbumin: 8 mg/dL — ABNORMAL LOW (ref 17–34)

## 2014-06-14 MED ORDER — FUROSEMIDE 10 MG/ML IJ SOLN
40.0000 mg | Freq: Two times a day (BID) | INTRAMUSCULAR | Status: DC
  Administered 2014-06-15 – 2014-06-16 (×5): 40 mg via INTRAVENOUS
  Filled 2014-06-14 (×5): qty 4

## 2014-06-14 MED ORDER — FUROSEMIDE 10 MG/ML IJ SOLN
INTRAMUSCULAR | Status: AC
  Administered 2014-06-14: 40 mg
  Filled 2014-06-14: qty 4

## 2014-06-14 NOTE — Progress Notes (Signed)
12 Days Post-Op  Subjective: PT with no acute issues overnight.  TF adv.  Weaning vent as tol  Objective: Vital signs in last 24 hours: Temp:  [98.1 F (36.7 C)-102.2 F (39 C)] 98.1 F (36.7 C) (04/08 2341) Pulse Rate:  [67-120] 83 (04/09 0600) Resp:  [25-28] 28 (04/09 0436) BP: (118-177)/(50-80) 137/56 mmHg (04/09 0436) SpO2:  [89 %-99 %] 97 % (04/09 0600) Arterial Line BP: (103-202)/(44-87) 142/57 mmHg (04/09 0600) FiO2 (%):  [30 %-40 %] 40 % (04/09 0600) Weight:  [48.7 kg (107 lb 5.8 oz)] 48.7 kg (107 lb 5.8 oz) (04/09 0400) Last BM Date: 06/12/14  Intake/Output from previous day: 04/08 0701 - 04/09 0700 In: 2497.8 [I.V.:992.8; NG/GT:1405; IV Piggyback:100] Out: 1460 [Urine:1185; Stool:275] Intake/Output this shift: Total I/O In: 60 [Other:60] Out: -   General appearance: alert GI: soft, nttp, midline wound with vac in place  Lab Results:   Recent Labs  06/12/14 0420 06/14/14 0500  WBC 17.3* 18.1*  HGB 8.0* 7.2*  HCT 26.5* 23.7*  PLT 439* 443*   BMET  Recent Labs  06/13/14 0350 06/14/14 0500  NA 148* 144  K 3.9 3.9  CL 105 109  CO2 34* 28  GLUCOSE 118* 131*  BUN 33* 24*  CREATININE 0.71 0.60  CALCIUM 7.8* 8.0*   PT/INR No results for input(s): LABPROT, INR in the last 72 hours. ABG  Recent Labs  06/11/14 1557 06/12/14 1000  PHART 7.489* 7.481*  HCO3 44.3* 39.2*    Studies/Results: Dg Chest Port 1 View  06/13/2014   CLINICAL DATA:  ARDS.  EXAM: PORTABLE CHEST - 1 VIEW  COMPARISON:  06/12/2014.  FINDINGS: Endotracheal tube, NG tube, left IJ line stable position. Heart size normal. Persistent bilateral airspace disease noted with atelectatic changes in the right mid lung and left lower lobe. Persistent left pleural effusion. No interim change. No pneumothorax. No acute bony abnormality .  IMPRESSION: 1. Lines and tubes in stable position. 2. Persistent bilateral airspace disease with right mid lung field subsegmental atelectasis and left base  prominent atelectatic changes. Persistent left pleural effusion. Chest is unchanged.   Electronically Signed   By: Marcello Moores  Register   On: 06/13/2014 07:23    Anti-infectives: Anti-infectives    Start     Dose/Rate Route Frequency Ordered Stop   06/06/14 1200  micafungin (MYCAMINE) 100 mg in sodium chloride 0.9 % 100 mL IVPB  Status:  Discontinued     100 mg 100 mL/hr over 1 Hours Intravenous Daily 06/06/14 0944 06/13/14 1025   06/05/14 1000  vancomycin (VANCOCIN) 500 mg in sodium chloride 0.9 % 100 mL IVPB  Status:  Discontinued     500 mg 100 mL/hr over 60 Minutes Intravenous Every 12 hours 06/05/14 0839 06/11/14 1048   06/04/14 1400  vancomycin (VANCOCIN) IVPB 1000 mg/200 mL premix  Status:  Discontinued     1,000 mg 200 mL/hr over 60 Minutes Intravenous Every 24 hours 06/04/14 1313 06/05/14 0839   06/03/14 0200  piperacillin-tazobactam (ZOSYN) IVPB 3.375 g  Status:  Discontinued     3.375 g 12.5 mL/hr over 240 Minutes Intravenous Every 8 hours 06/02/14 2346 06/13/14 1025   06/02/14 1845  piperacillin-tazobactam (ZOSYN) IVPB 3.375 g     3.375 g 12.5 mL/hr over 240 Minutes Intravenous  Once 06/02/14 1833 06/02/14 2257      Assessment/Plan:  Colonic perforation & Crohns disease POD #12 s/p Ex Lap, partial colectomy/colostomy  -Suspected secondary to IBD immunosuppressed on humira, prednisone, and sulfasalazine -Intubated  and sedated -Continue abx. WBC 18.1, Abdomen seems benign, CXR pending -Wound vac was change today  PCM/inability to take PO on tube feedings -Tube feeds advancing to goal, order pre-albumin, dietitian consult, check residuals  Septic shock from peritonitis  Acute respiratory failure -ARDS per CCM  H/o MI with stent Tobacco use VTE Proph - heparin and SCD's  LOS: 12 days    Rosario Jacks., [facility] 06/14/2014

## 2014-06-14 NOTE — Progress Notes (Addendum)
PULMONARY / CRITICAL CARE MEDICINE   Name: [name] MRN: [id number] DOB: May 03, 1955   PCP Nance Pear., NP   ADMISSION DATE:  06/02/2014 CONSULTATION DATE:  06/04/2014   REFERRING MD :  Dr Leighton Ruff  BRIEF SUMMARY:  59 y/o F, smoker, with hx of MI in the 1980s with s.p stent (details not known), prolonged ICU stay for septic shock from PNA/cellulitis (2009) at [facility] and IBD immunsupprssed on humira, ? Prednisone and sulfasalazine. Admitted 3/28 with colonic bowel perforation and s/p ex-lap with partial colectomy / resection of colostomy & creation of new colostomy 3/28.  On 3/30, she had sudden respiratory decompensation requiring emergent intubation.  Clear CXR on admit.  Post intubation CXR c/w pulmonary edema.  Progressed to ARDS 3/31.     SIGNIFICANT EVENTS: 3/28  Admit 3/30  PCCM consult for abrupt resp decline 3/31  ARDS NET + Start  NIH ROSE Trial (Re-evaluaton Of Systemic Early NMB) - randomized to sedation only Arm  4/03  LUE arterial and venous dopplers >> negative 4/05  Weaned to 0.40, PEEP 8 4/06  Vt liberalized to 8cc/kg 06/13/14: PEEP down to 5, Vt liberalized to 8cc/kg on 4/6   SUBJECTIVE/OVERNIGHT/INTERVAL HX 06/14/14: . Awake but not following commands. On Precedex gtt + fent gtt 173mcg and doing PSV on it; this is half the dose of fent earlier. Tolerating TF. Making urine. Febrile last night - part of ongoping fevers  VITAL SIGNS: Temp:  [98.1 F (36.7 C)-101.2 F (38.4 C)] 100.3 F (37.9 C) (04/09 1000) Pulse Rate:  [67-89] 89 (04/09 1000) Resp:  [25-28] 28 (04/09 0840) BP: (118-177)/(50-80) 131/53 mmHg (04/09 0840) SpO2:  [96 %-100 %] 100 % (04/09 1000) Arterial Line BP: (103-170)/(44-78) 148/60 mmHg (04/09 1000) FiO2 (%):  [40 %] 40 % (04/09 1147) Weight:  [48.7 kg (107 lb 5.8 oz)] 48.7 kg (107 lb 5.8 oz) (04/09 0400)   HEMODYNAMICS: CVP:  [6 mmHg-12 mmHg] 11 mmHg   VENTILATOR SETTINGS: Vent Mode:  [-] CPAP FiO2 (%):  [40 %] 40 % Set  Rate:  [20 bmp] 20 bmp Vt Set:  [360 mL] 360 mL PEEP:  [5 cmH20] 5 cmH20 Pressure Support:  [10 cmH20] 10 cmH20 Plateau Pressure:  [16 cmH20-25 cmH20] 24 cmH20   INTAKE / OUTPUT:  Intake/Output Summary (Last 24 hours) at 06/14/14 1214 Last data filed at 06/14/14 1100  Gross per 24 hour  Intake 2359.53 ml  Output   1235 ml  Net 1124.53 ml    PHYSICAL EXAMINATION: General:  Critically ill looking female Neuro:  On sedation awake and has moved arms.  HEENT:  ET tube + NGT Cardiovascular:  Tachycardic, Normal heart sounds Lungs:  Decreased bs+ Abdomen:  Soft, dressing intact, vac in place Musculoskeletal:  L arm remains cool, able to doppler pulses Skin:  Intact anteriorly  LABS:  PULMONARY  Recent Labs Lab 06/11/14 1145 06/11/14 1429 06/11/14 1557 06/12/14 1000  PHART 7.493* 7.495* 7.489* 7.481*  PCO2ART 56.0* 55.4* 58.8* 53.1*  PO2ART 70.6* 92.2 89.5 75.2*  HCO3 42.5* 42.3* 44.3* 39.2*  TCO2 38.1 39.1 40.6 36.9  O2SAT 92.5 96.3 95.6 94.0   CBC  Recent Labs Lab 06/11/14 0500 06/12/14 0420 06/14/14 0500  HGB 8.5* 8.0* 7.2*  HCT 27.5* 26.5* 23.7*  WBC 17.3* 17.3* 18.1*  PLT 406* 439* 443*   COAGULATION No results for input(s): INR in the last 168 hours. CARDIAC No results for input(s): TROPONINI in the last 168 hours. No results for input(s): PROBNP in  the last 168 hours.  CHEMISTRY  Recent Labs Lab 06/08/14 0513 06/09/14 0520 06/10/14 0300 06/10/14 1640 06/11/14 0500 06/12/14 0420 06/13/14 0350 06/14/14 0500  NA 142 144 146* 147* 147* 150* 148* 144  K 3.7 2.7* 2.1* 2.5* 3.8 3.3* 3.9 3.9  CL 109 108 95* 92* 98 101 105 109  CO2 25 28 39* 46* 43* 40* 34* 28  GLUCOSE 107* 145* 126* 137* 146* 107* 118* 131*  BUN 15 26* 28* 27* 29* 28* 33* 24*  CREATININE 0.91 0.82 0.82 0.79 0.74 0.68 0.71 0.60  CALCIUM 7.9* 8.0* 7.9* 7.6* 7.9* 8.1* 7.8* 8.0*  MG 2.0 2.0 1.6  --   --  1.9  --   --   PHOS 4.7* 3.4 3.5  --   --  2.2*  --   --    Estimated  Creatinine Clearance: 54.4 mL/min (by C-G formula based on Cr of 0.6).  LIVER No results for input(s): AST, ALT, ALKPHOS, BILITOT, PROT, ALBUMIN, INR in the last 168 hours. INFECTIOUS  Recent Labs Lab 06/13/14 0350  PROCALCITON 0.66   ENDOCRINE CBG (last 3)   Recent Labs  06/13/14 1214 06/13/14 1747 06/13/14 2024  GLUCAP 177* 135* 118*     IMAGING x48h Dg Chest Port 1 View  06/13/2014   CLINICAL DATA:  ARDS.  EXAM: PORTABLE CHEST - 1 VIEW  COMPARISON:  06/12/2014.  FINDINGS: Endotracheal tube, NG tube, left IJ line stable position. Heart size normal. Persistent bilateral airspace disease noted with atelectatic changes in the right mid lung and left lower lobe. Persistent left pleural effusion. No interim change. No pneumothorax. No acute bony abnormality .  IMPRESSION: 1. Lines and tubes in stable position. 2. Persistent bilateral airspace disease with right mid lung field subsegmental atelectasis and left base prominent atelectatic changes. Persistent left pleural effusion. Chest is unchanged.   Electronically Signed   By: Marcello Moores  Register   On: 06/13/2014 07:23    ASSESSMENT / PLAN:  PULMONARY OETT  06/04/2014 >> A:  Acute Respiratory failure due to ARDS - s/p ROSE NIH PETAL Trial - randomized to sedation only arm.    - doing SBT 06/14/14. Not ready for extubation P:   Should be in position to start PSV / SBT 4/7 On SBT 06/14/14 but sedation needs preclude extubation Consider lasix     CARDIOVASCULAR CVL L IJ 3/30>> A:  CAD, Hx of MI s/p stents in 1980s during age 71s Shock, presumed septic shock due to peritonitis Cool L UE, still has pulses but concerning for clot or ischemia; doppler negative arterial and venous   - off pressors    P:  Diuresis x 1 06/14/14  RENAL A:   Intact but at risk for AKI   P:   Follow BMP Replace electrolytes as indicated Start free water on 4/7 but ok for lasix x 1  GASTROINTESTINAL A:   Crohn's disease, chronic  immunosuppresion Perforation s/p surgical resection Peritonitis  P:   Appreciate CCS and GI recs Tolerating TF Wound vac being changed MWF  HEMATOLOGIC A:   At risk for anemia of critical illness P:  PRBC per ICU guidelines  INFECTIOUS BCx2 >> negative UC >> negative Tracheal aspirate for bacteria and virus 06/04/14 >> few candida albicans RVP >> negative ./.....................Marland Kitchen    A:   Baseline  - Immunocmpromised patient due to humira + prednisone Current   - admit with bowel perf / peritonitis 06/02/14  - pulmonary event -06/04/14 ARDS - trach aspirate with  few candida  Ongoing fevers 06/14/2014. 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  None at bedside 06/14/2014      The patient is critically ill with multiple organ systems failure and requires high complexity decision making for assessment and support, frequent evaluation and titration of therapies, application of advanced monitoring technologies and extensive interpretation of multiple databases.   Critical Care Time devoted to patient care services described in this note is  45  Minutes. This time reflects time of care of this signee Dr Brand Males. This critical care time does not reflect procedure time, or teaching time or  supervisory time of PA/NP/Med student/Med Resident etc but could involve care discussion time    Dr. Brand Males, M.D., [facility]C.P Pulmonary and Critical Care Medicine Staff Physician [facility] Pager: [phone], If no answer or between  15:00h - 7:00h: call [phone]  06/14/2014 12:33 PM          Anti-infectives    Start     Dose/Rate Route Frequency Ordered Stop   06/06/14 1200  micafungin (MYCAMINE) 100 mg in sodium chloride 0.9 % 100 mL IVPB  Status:  Discontinued     100 mg 100 mL/hr over 1 Hours Intravenous Daily 06/06/14 0944 06/13/14 1025   06/05/14 1000  vancomycin (VANCOCIN) 500 mg in sodium chloride 0.9 % 100 mL IVPB  Status:  Discontinued     500 mg 100 mL/hr over 60 Minutes Intravenous Every 12 hours 06/05/14 0839 06/11/14 1048   06/04/14 1400  vancomycin (VANCOCIN) IVPB 1000 mg/200 mL premix  Status:  Discontinued     1,000 mg 200 mL/hr over 60 Minutes Intravenous Every 24 hours 06/04/14 1313 06/05/14 0839   06/03/14 0200  piperacillin-tazobactam (ZOSYN) IVPB 3.375 g  Status:  Discontinued     3.375 g 12.5 mL/hr over 240 Minutes Intravenous Every 8 hours 06/02/14 2346 06/13/14 1025   06/02/14 1845  piperacillin-tazobactam (ZOSYN) IVPB 3.375 g     3.375 g 12.5 mL/hr over 240 Minutes Intravenous  Once 06/02/14 1833 06/02/14 2257

## 2014-06-15 ENCOUNTER — Inpatient Hospital Stay (HOSPITAL_COMMUNITY): Payer: Commercial Managed Care - HMO

## 2014-06-15 LAB — GLUCOSE, CAPILLARY
GLUCOSE-CAPILLARY: 147 mg/dL — AB (ref 70–99)
GLUCOSE-CAPILLARY: 89 mg/dL (ref 70–99)
GLUCOSE-CAPILLARY: 133 mg/dL — AB (ref 70–99)
GLUCOSE-CAPILLARY: 110 mg/dL — AB (ref 70–99)
GLUCOSE-CAPILLARY: 132 mg/dL — AB (ref 70–99)

## 2014-06-15 LAB — BASIC METABOLIC PANEL
Anion gap: 8 (ref 5–15)
BUN: 31 mg/dL — ABNORMAL HIGH (ref 6–23)
CO2: 30 mmol/L (ref 19–32)
Calcium: 7.9 mg/dL — ABNORMAL LOW (ref 8.4–10.5)
Chloride: 107 mmol/L (ref 96–112)
Creatinine, Ser: 0.68 mg/dL (ref 0.50–1.10)
GFR calc Af Amer: >90 mL/min (ref >90)
GLUCOSE: 138 mg/dL — AB (ref 70–99)
Potassium: 3.5 mmol/L (ref 3.5–5.1)
Sodium: 145 mmol/L (ref 135–145)

## 2014-06-15 LAB — URINE CULTURE
Colony Count: NO GROWTH
Culture: NO GROWTH

## 2014-06-15 LAB — CBC WITH DIFFERENTIAL/PLATELET
BASOS ABS: 0 10*3/uL (ref 0.0–0.1)
Basophils Relative: 0 % (ref 0–1)
Eosinophils Absolute: 0.5 10*3/uL (ref 0.0–0.7)
Eosinophils Relative: 3 % (ref 0–5)
HEMATOCRIT: 23.3 % — AB (ref 36.0–46.0)
HEMOGLOBIN: 7.1 g/dL — AB (ref 12.0–15.0)
LYMPHS ABS: 2.9 10*3/uL (ref 0.7–4.0)
Lymphocytes Relative: 19 % (ref 12–46)
MCH: 29.8 pg (ref 26.0–34.0)
MCHC: 30.5 g/dL (ref 30.0–36.0)
MCV: 97.9 fL (ref 78.0–100.0)
Monocytes Absolute: 1.4 10*3/uL — ABNORMAL HIGH (ref 0.1–1.0)
Monocytes Relative: 9 % (ref 3–12)
NEUTROS PCT: 69 % (ref 43–77)
Neutro Abs: 10.3 10*3/uL — ABNORMAL HIGH (ref 1.7–7.7)
Platelets: 536 10*3/uL — ABNORMAL HIGH (ref 150–400)
RBC: 2.38 MIL/uL — AB (ref 3.87–5.11)
RDW: 15.1 % (ref 11.5–15.5)
WBC: 15.1 10*3/uL — ABNORMAL HIGH (ref 4.0–10.5)

## 2014-06-15 LAB — MAGNESIUM: MAGNESIUM: 1.9 mg/dL (ref 1.5–2.5)

## 2014-06-15 LAB — PHOSPHORUS: Phosphorus: 3.2 mg/dL (ref 2.3–4.6)

## 2014-06-15 LAB — PROCALCITONIN: PROCALCITONIN: 0.37 ng/mL

## 2014-06-15 MED ORDER — POTASSIUM CHLORIDE 20 MEQ/15ML (10%) PO SOLN
40.0000 meq | Freq: Two times a day (BID) | ORAL | Status: DC
  Administered 2014-06-15 – 2014-06-17 (×5): 40 meq
  Filled 2014-06-15 (×5): qty 30

## 2014-06-15 MED ORDER — MAGNESIUM SULFATE 2 GM/50ML IV SOLN
2.0000 g | Freq: Once | INTRAVENOUS | Status: AC
  Administered 2014-06-15: 2 g via INTRAVENOUS
  Filled 2014-06-15: qty 50

## 2014-06-15 MED ORDER — SODIUM CHLORIDE 0.9 % IV SOLN
100.0000 mg | Freq: Every day | INTRAVENOUS | Status: DC
  Administered 2014-06-15 – 2014-06-20 (×6): 100 mg via INTRAVENOUS
  Filled 2014-06-15 (×7): qty 100

## 2014-06-15 NOTE — Progress Notes (Signed)
PULMONARY / CRITICAL CARE MEDICINE   Name: [name] MRN: [id number] DOB: Nov 29, 1955   PCP Nance Pear., NP   ADMISSION DATE:  06/02/2014 CONSULTATION DATE:  06/04/2014   REFERRING MD :  Dr Leighton Ruff  BRIEF SUMMARY:  59 y/o F, smoker, with hx of MI in the 1980s with s.p stent (details not known), prolonged ICU stay for septic shock from PNA/cellulitis (2009) at [facility] and IBD immunsupprssed on humira, ? Prednisone and sulfasalazine. Admitted 3/28 with colonic bowel perforation and s/p ex-lap with partial colectomy / resection of colostomy & creation of new colostomy 3/28.  On 3/30, she had sudden respiratory decompensation requiring emergent intubation.  Clear CXR on admit.  Post intubation CXR c/w pulmonary edema.  Progressed to ARDS 3/31.     SIGNIFICANT EVENTS: 3/28  Admit 3/30  PCCM consult for abrupt resp decline 3/31  ARDS NET + Start  NIH ROSE Trial (Re-evaluaton Of Systemic Early NMB) - randomized to sedation only Arm  4/03  LUE arterial and venous dopplers >> negative 4/05  Weaned to 0.40, PEEP 8 4/06  Vt liberalized to 8cc/kg 06/13/14: PEEP down to 5, Vt liberalized to 8cc/kg on 4/6 06/14/14: . Awake but not following commands. On Precedex gtt + fent gtt 13mcg and doing PSV on it; this is half the dose of fent earlier. Tolerating TF. Making urine. Febrile last night - part of ongoping fevers. ALine removed  SUBJECTIVE/OVERNIGHT/INTERVAL HX 06/15/14 A line (femoral) dc'ed yesterday but : Still with fevers but WBC/PCT trending down. Doing SBT on lower dose fent gtt 25mcg/precedex and following commands. Did get agitated on lower dose fent yesterday   VITAL SIGNS: Temp:  [97.6 F (36.4 C)-101.3 F (38.5 C)] 101.3 F (38.5 C) (04/10 0800) Pulse Rate:  [66-89] 79 (04/10 0800) Resp:  [24-28] 28 (04/10 0304) BP: (83-117)/(36-68) 102/57 mmHg (04/10 0800) SpO2:  [98 %-100 %] 99 % (04/10 0800) Arterial Line BP: (105-154)/(44-70) 141/58 mmHg (04/09 1400) FiO2 (%):   [30 %-40 %] 30 % (04/10 0823) Weight:  [50.4 kg (111 lb 1.8 oz)] 50.4 kg (111 lb 1.8 oz) (04/10 0438)   HEMODYNAMICS: CVP:  [6 mmHg-10 mmHg] 6 mmHg   VENTILATOR SETTINGS: Vent Mode:  [-] CPAP FiO2 (%):  [30 %-40 %] 30 % Set Rate:  [20 bmp] 20 bmp Vt Set:  [360 mL] 360 mL PEEP:  [5 cmH20] 5 cmH20 Pressure Support:  [10 cmH20] 10 cmH20 Plateau Pressure:  [18 cmH20-24 cmH20] 20 cmH20   INTAKE / OUTPUT:  Intake/Output Summary (Last 24 hours) at 06/15/14 0853 Last data filed at 06/15/14 0800  Gross per 24 hour  Intake 2555.1 ml  Output   3940 ml  Net -1384.9 ml    PHYSICAL EXAMINATION: General:  Critically ill looking female Neuro:  fent 50 + precedex -> CAM-IC neg for delirium. Movesl all 4s. rASS 0 HEENT:  ET tube + NGT Cardiovascular:  Tachycardic, Normal heart sounds Lungs:  Decreased bs+ Abdomen:  Soft, dressing intact, vac in place Musculoskeletal:  L arm remains cool, able to doppler pulses Skin:  Intact anteriorly  LABS:  PULMONARY  Recent Labs Lab 06/11/14 1145 06/11/14 1429 06/11/14 1557 06/12/14 1000  PHART 7.493* 7.495* 7.489* 7.481*  PCO2ART 56.0* 55.4* 58.8* 53.1*  PO2ART 70.6* 92.2 89.5 75.2*  HCO3 42.5* 42.3* 44.3* 39.2*  TCO2 38.1 39.1 40.6 36.9  O2SAT 92.5 96.3 95.6 94.0   CBC  Recent Labs Lab 06/12/14 0420 06/14/14 0500 06/15/14 0534  HGB 8.0* 7.2* 7.1*  HCT 26.5* 23.7* 23.3*  WBC 17.3* 18.1* 15.1*  PLT 439* 443* 536*   COAGULATION No results for input(s): INR in the last 168 hours. CARDIAC No results for input(s): TROPONINI in the last 168 hours. No results for input(s): PROBNP in the last 168 hours.  CHEMISTRY  Recent Labs Lab 06/09/14 0520 06/10/14 0300  06/11/14 0500 06/12/14 0420 06/13/14 0350 06/14/14 0500 06/15/14 0534  NA 144 146*  < > 147* 150* 148* 144 145  K 2.7* 2.1*  < > 3.8 3.3* 3.9 3.9 3.5  CL 108 95*  < > 98 101 105 109 107  CO2 28 39*  < > 43* 40* 34* 28 30  GLUCOSE 145* 126*  < > 146* 107* 118* 131*  138*  BUN 26* 28*  < > 29* 28* 33* 24* 31*  CREATININE 0.82 0.82  < > 0.74 0.68 0.71 0.60 0.68  CALCIUM 8.0* 7.9*  < > 7.9* 8.1* 7.8* 8.0* 7.9*  MG 2.0 1.6  --   --  1.9  --   --  1.9  PHOS 3.4 3.5  --   --  2.2*  --   --  3.2  < > = values in this interval not displayed. Estimated Creatinine Clearance: 54.4 mL/min (by C-G formula based on Cr of 0.68).  LIVER No results for input(s): AST, ALT, ALKPHOS, BILITOT, PROT, ALBUMIN, INR in the last 168 hours. INFECTIOUS  Recent Labs Lab 06/13/14 0350 06/14/14 1303 06/15/14 0534  PROCALCITON 0.66 0.48 0.37   ENDOCRINE CBG (last 3)   Recent Labs  06/14/14 1621 06/14/14 1958 06/14/14 2308  GLUCAP 142* 147* 89     IMAGING x48h Dg Chest Port 1 View  06/15/2014   CLINICAL DATA:  59 year old female with respiratory failure.  ARDS  EXAM: PORTABLE CHEST - 1 VIEW  COMPARISON:  06/13/2014 and prior radiographs  FINDINGS: The cardiomediastinal silhouette is unremarkable.  An endotracheal tube with tip 2.5 cm above the carina, left IJ central venous catheter with tip overlying the mid SVC, and NG tube with tip overlying the proximal -mid stomach is again noted.  Bilateral airspace and interstitial opacities are again noted.  A small left pleural effusion and left basilar atelectasis/ consolidation is again identified.  There is no evidence of pneumothorax.  IMPRESSION: No significant changes. Support apparatus described with continued bilateral airspace and interstitial opacities, small left pleural effusion and left basilar atelectasis/ consolidation.   Electronically Signed   By: Margarette Canada M.D.   On: 06/15/2014 08:33    ASSESSMENT / PLAN:  PULMONARY OETT  06/04/2014 >> A:  Acute Respiratory failure due to ARDS - s/p ROSE NIH PETAL Trial - randomized to sedation only arm.    - doing SBT 06/15/14 but on fent 50 and precedex. Concern she might get tachypneic off fent  P:   SBT as tolerated Extubate 06/15/2014 if she can do SBT without fent  gtt Continue lasix - CVP now 8    CARDIOVASCULAR CVL L IJ 3/30>> A:  CAD, Hx of MI s/p stents in 1980s during age 66s Shock, presumed septic shock due to peritonitis Cool L UE, still has pulses but concerning for clot or ischemia; doppler negative arterial and venous   - off pressors. CVP down to 8 with lasix    P:  Diuresis since 06/13/14 to continue - reassess need daily CVP goal > 6  RENAL A:   Intact but at risk for AKI   - mild low K and  mild low mag. On free water for previous hypernatremia. Also on lasix. ? Net 11L negative since 4/4/416   P:   Follow BMP Replace electrolytes as indicated Continue low dose free water since 06/12/14  Continue lasix  GASTROINTESTINAL A:   Crohn's disease, chronic immunosuppresion Perforation s/p surgical resection Peritonitis  P:   Appreciate CCS and GI recs Tolerating TF Wound vac being changed MWF  HEMATOLOGIC A:    anemia of critical illness P:  PRBC per ICU guidelines  Only if < 7gm%   INFECTIOUS BCx2 >> negative UC >> negative Tracheal aspirate for bacteria and virus 06/04/14 >> few candida albicans RVP >> negative ................ Blood 06/14/14>> BAL 06/14/14 Urine 06/14/14>>  A:   Baseline  - Immunocmpromised patient due to humira + prednisone Current   - admit with bowel perf / peritonitis 06/02/14  - pulmonary event -06/04/14 ARDS - trach aspirate with few candida   - Ongoing fevers 4/102016. OFf all antimicrobials as of 06/13/14 but still with fever  Despite central line dc 06/14/14    P:   Change central line to PICC line - ordered 06/15/2014 AWait Pan culture from 06/14/2014   Restart micafungn - total 2 weeks needed  Per guidelines (immunsuppressed + peritonitis + fe candida in BAL 06/04/14) .............Marland Kitchen Zosyn 3/29>> (plan 10-14 days) Vanc 3/31 >> 4/6 Micafungin 06/06/14 (candida in BAL, peritonitis) >>06/13/14, 06/15/14 >. 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  None at bedside 06/14/2014 and 06/15/14      The patient is critically ill with multiple organ systems failure and requires high complexity decision making for assessment and support, frequent evaluation and titration of therapies, application of advanced monitoring technologies and extensive interpretation of multiple databases.   Critical Care Time devoted to patient care services described in this note is  35  Minutes. This time reflects time of care of this signee Dr Brand Males. This critical care time does not reflect procedure time, or teaching time or supervisory time of PA/NP/Med student/Med Resident etc but could involve care discussion time    Dr. Brand Males, M.D., [facility].C.P Pulmonary and Critical Care Medicine Staff Physician [place] Pager: [phone], If no answer or between  15:00h - 7:00h: call [phone]  06/15/2014 8:53 AM

## 2014-06-15 NOTE — Progress Notes (Signed)
13 Days Post-Op  Subjective: Pt with no acute changes.   Tol TFs  Objective: Vital signs in last 24 hours: Temp:  [97.6 F (36.4 C)-101.3 F (38.5 C)] 101.3 F (38.5 C) (04/10 0800) Pulse Rate:  [66-89] 79 (04/10 0800) Resp:  [24-28] 28 (04/10 0304) BP: (83-131)/(36-68) 102/57 mmHg (04/10 0800) SpO2:  [98 %-100 %] 99 % (04/10 0800) Arterial Line BP: (105-154)/(44-70) 141/58 mmHg (04/09 1400) FiO2 (%):  [30 %-40 %] 30 % (04/10 0823) Weight:  [50.4 kg (111 lb 1.8 oz)] 50.4 kg (111 lb 1.8 oz) (04/10 0438) Last BM Date: 06/14/14  Intake/Output from previous day: 04/09 0701 - 04/10 0700 In: 2557.6 [I.V.:907.6; NG/GT:1650] Out: 3800 [Urine:3200; Stool:600] Intake/Output this shift: Total I/O In: 92.4 [I.V.:37.4; NG/GT:55] Out: -   General appearance: alert and cooperative GI: soft, nttp, nd, ostomy pink/patent  Lab Results:   Recent Labs  06/14/14 0500 06/15/14 0534  WBC 18.1* 15.1*  HGB 7.2* 7.1*  HCT 23.7* 23.3*  PLT 443* 536*   BMET  Recent Labs  06/14/14 0500 06/15/14 0534  NA 144 145  K 3.9 3.5  CL 109 107  CO2 28 30  GLUCOSE 131* 138*  BUN 24* 31*  CREATININE 0.60 0.68  CALCIUM 8.0* 7.9*   PT/INR No results for input(s): LABPROT, INR in the last 72 hours. ABG  Recent Labs  06/12/14 1000  PHART 7.481*  HCO3 39.2*    Studies/Results: No results found.  Anti-infectives: Anti-infectives    Start     Dose/Rate Route Frequency Ordered Stop   06/06/14 1200  micafungin (MYCAMINE) 100 mg in sodium chloride 0.9 % 100 mL IVPB  Status:  Discontinued     100 mg 100 mL/hr over 1 Hours Intravenous Daily 06/06/14 0944 06/13/14 1025   06/05/14 1000  vancomycin (VANCOCIN) 500 mg in sodium chloride 0.9 % 100 mL IVPB  Status:  Discontinued     500 mg 100 mL/hr over 60 Minutes Intravenous Every 12 hours 06/05/14 0839 06/11/14 1048   06/04/14 1400  vancomycin (VANCOCIN) IVPB 1000 mg/200 mL premix  Status:  Discontinued     1,000 mg 200 mL/hr over 60  Minutes Intravenous Every 24 hours 06/04/14 1313 06/05/14 0839   06/03/14 0200  piperacillin-tazobactam (ZOSYN) IVPB 3.375 g  Status:  Discontinued     3.375 g 12.5 mL/hr over 240 Minutes Intravenous Every 8 hours 06/02/14 2346 06/13/14 1025   06/02/14 1845  piperacillin-tazobactam (ZOSYN) IVPB 3.375 g     3.375 g 12.5 mL/hr over 240 Minutes Intravenous  Once 06/02/14 1833 06/02/14 2257      Assessment/Plan: 13 Days Post-Op   Colonic perforation & Crohns disease POD #13 s/p Ex Lap, partial colectomy/colostomy  -Suspected secondary to IBD immunosuppressed on humira, prednisone, and sulfasalazine -Intubated and following commands -WBC 15.1, Abdomen seems benign, febrile-more likely pulm related -Wound vac was change Fri AM  PCM/inability to take PO on tube feedings -Tube feeds at goal,   Septic shock from peritonitis  Acute respiratory failure -ARDS per CCM  H/o MI with stent Tobacco use VTE Proph - heparin and SCD's    LOS: 13 days    [name]., [name] 06/15/2014

## 2014-06-16 ENCOUNTER — Inpatient Hospital Stay (HOSPITAL_COMMUNITY): Payer: Commercial Managed Care - HMO

## 2014-06-16 LAB — CBC WITH DIFFERENTIAL/PLATELET
BASOS PCT: 1 % (ref 0–1)
Basophils Absolute: 0.2 10*3/uL — ABNORMAL HIGH (ref 0.0–0.1)
EOS PCT: 3 % (ref 0–5)
Eosinophils Absolute: 0.5 10*3/uL (ref 0.0–0.7)
HEMATOCRIT: 23.8 % — AB (ref 36.0–46.0)
HEMOGLOBIN: 7.3 g/dL — AB (ref 12.0–15.0)
Lymphocytes Relative: 20 % (ref 12–46)
Lymphs Abs: 3.4 10*3/uL (ref 0.7–4.0)
MCH: 29.3 pg (ref 26.0–34.0)
MCHC: 30.7 g/dL (ref 30.0–36.0)
MCV: 95.6 fL (ref 78.0–100.0)
MONO ABS: 1.2 10*3/uL — AB (ref 0.1–1.0)
Monocytes Relative: 7 % (ref 3–12)
Neutro Abs: 11.6 10*3/uL — ABNORMAL HIGH (ref 1.7–7.7)
Neutrophils Relative %: 69 % (ref 43–77)
Platelets: 577 10*3/uL — ABNORMAL HIGH (ref 150–400)
RBC: 2.49 MIL/uL — ABNORMAL LOW (ref 3.87–5.11)
RDW: 14.8 % (ref 11.5–15.5)
WBC: 16.9 10*3/uL — ABNORMAL HIGH (ref 4.0–10.5)

## 2014-06-16 LAB — GLUCOSE, CAPILLARY
GLUCOSE-CAPILLARY: 170 mg/dL — AB (ref 70–99)
GLUCOSE-CAPILLARY: 124 mg/dL — AB (ref 70–99)
Glucose-Capillary: 105 mg/dL — ABNORMAL HIGH (ref 70–99)
Glucose-Capillary: 106 mg/dL — ABNORMAL HIGH (ref 70–99)
Glucose-Capillary: 109 mg/dL — ABNORMAL HIGH (ref 70–99)
Glucose-Capillary: 116 mg/dL — ABNORMAL HIGH (ref 70–99)
Glucose-Capillary: 121 mg/dL — ABNORMAL HIGH (ref 70–99)

## 2014-06-16 LAB — TROPONIN I
TROPONIN I: 0.06 ng/mL — AB (ref <0.031)
Troponin I: 0.09 ng/mL — ABNORMAL HIGH (ref <0.031)

## 2014-06-16 LAB — BASIC METABOLIC PANEL
Anion gap: 9 (ref 5–15)
BUN: 32 mg/dL — AB (ref 6–23)
CHLORIDE: 106 mmol/L (ref 96–112)
CO2: 29 mmol/L (ref 19–32)
CREATININE: 0.76 mg/dL (ref 0.50–1.10)
Calcium: 8.6 mg/dL (ref 8.4–10.5)
GFR calc Af Amer: >90 mL/min (ref >90)
GFR calc non Af Amer: >90 mL/min (ref >90)
Glucose, Bld: 136 mg/dL — ABNORMAL HIGH (ref 70–99)
Potassium: 3.5 mmol/L (ref 3.5–5.1)
Sodium: 144 mmol/L (ref 135–145)

## 2014-06-16 LAB — PHOSPHORUS: Phosphorus: 3.7 mg/dL (ref 2.3–4.6)

## 2014-06-16 LAB — PROCALCITONIN: PROCALCITONIN: 0.25 ng/mL

## 2014-06-16 LAB — MAGNESIUM: Magnesium: 2.2 mg/dL (ref 1.5–2.5)

## 2014-06-16 MED ORDER — DEXMEDETOMIDINE HCL IN NACL 200 MCG/50ML IV SOLN
0.4000 ug/kg/h | INTRAVENOUS | Status: DC
  Administered 2014-06-16 (×2): 0.4 ug/kg/h via INTRAVENOUS
  Filled 2014-06-16 (×3): qty 50

## 2014-06-16 MED ORDER — FENTANYL CITRATE 0.05 MG/ML IJ SOLN
25.0000 ug | INTRAMUSCULAR | Status: DC | PRN
  Administered 2014-06-16 (×3): 50 ug via INTRAVENOUS
  Administered 2014-06-16 – 2014-06-17 (×3): 100 ug via INTRAVENOUS
  Administered 2014-06-18: 50 ug via INTRAVENOUS
  Administered 2014-06-18: 100 ug via INTRAVENOUS
  Administered 2014-06-18: 75 ug via INTRAVENOUS
  Administered 2014-06-19: 50 ug via INTRAVENOUS
  Administered 2014-06-19 (×3): 100 ug via INTRAVENOUS
  Filled 2014-06-16 (×18): qty 2

## 2014-06-16 MED ORDER — SODIUM CHLORIDE 0.9 % IV BOLUS (SEPSIS)
1000.0000 mL | Freq: Once | INTRAVENOUS | Status: AC
  Administered 2014-06-16: 1000 mL via INTRAVENOUS

## 2014-06-16 NOTE — Progress Notes (Signed)
eLink Physician-Brief Progress Note Patient Name: [name] DOB: 03/07/1956 MRN: [id number]   Date of Service  06/16/2014  HPI/Events of Note  Hypotension with SBP = 70's s/p re initiation of Precedex IV infusion.  eICU Interventions  Will order: 1. Hold Precedex IV infusion. 2. 0.9 NaCl 1 liter IV over 1 hour now.      Intervention Category Major Interventions: Hypotension - evaluation and management  Lysle Dingwall 06/16/2014, 5:34 PM

## 2014-06-16 NOTE — Consult Note (Signed)
WOC ostomy follow up (Pouch change required despite being applied this am due to placement over VAC dressing) Stoma type/location: LLQ Colostomy Stomal assessment/size: 1 and 5/8 inches round in a small gulley.  Os drifting toward 6 o'clock. Peristomal assessment: intact, mild maceration as no skin barrier ring was placed with this morning's pouch change. Treatment options for stomal/peristomal skin: skin barrier ring to fill gulley. Output: liquid brown effluent.  300 mls emptied today prior to pouch change Ostomy pouching: 2pc., 2 and 1/4 inch pouching system with skin barrier ring Education provided: None (Disposition is still not clear.  Patient is still on vent, albeit less sedated.) Next pouch change is due with VAC dressing change on Wednesday, 06/18/14. Enrolled patient in [facility] Discharge program:No (See above)  WOC wound follow up Wound type:Midline surgical Measurement:15.5cm x 2.5cm x 3.5cm Wound bed:Red, moist, sealed in places but easily separates with gentle probing with cotton tipped applicator Drainage (amount, consistency, odor) Scant serosanguinous Periwound:intact with mild maceration at periphery near ostomy (3 o'clock).  No Mepitel in place with last dressing change. Dressing procedure/placement/frequency: NPWT dressing changed, wound cleansed and new dressing applied without incident.  Mepitel, non-adherent layer placed in wound bed prior to 1 piece of black foam.  Seal achieved without difficulty. Patient tolerated procedure well.  157mmHg continuous negative pressure in place. Next dressing change is Wednesday, 06/18/14.  [place] nursing team will follow, and will remain available to this patient, the nursing, surgical and medical teams.   Thanks, Maudie Flakes, MSN, RN, [place], [place], [place] [phone])

## 2014-06-16 NOTE — Progress Notes (Signed)
Patient ID: [name], female   DOB: Dec 02, 1955, 59 y.o.   MRN: [id number]  [facility]  POD#: 14  Subjective: Patient awake, watching TV.  Responds to questions.  Denies abdominal pain.  On vent.  Objective: Vital signs in last 24 hours: Temp:  [98.4 F (36.9 C)-101.3 F (38.5 C)] 99.7 F (37.6 C) (04/11 0000) Pulse Rate:  [65-97] 88 (04/11 0700) Resp:  [31-32] 31 (04/11 0313) BP: (90-140)/(37-66) 107/42 mmHg (04/11 0700) SpO2:  [94 %-99 %] 98 % (04/11 0700) FiO2 (%):  [30 %] 30 % (04/11 0313) Weight:  [48.1 kg (106 lb 0.7 oz)] 48.1 kg (106 lb 0.7 oz) (04/11 0500) Last BM Date: 06/14/14  Intake/Output from previous day: 04/10 0701 - 04/11 0700 In: 2536.7 [I.V.:705.4; NW/GN:5621.3; IV Piggyback:150] Out: 3550 [Urine:2950; Stool:600] Intake/Output this shift:    Physical Exam: HEENT - sclerae clear, mucous membranes moist Neck - soft Chest - coarse bilaterally Cor - RRR Abdomen - soft, BS present; VAC dressing in midline wound, intact; stoma viable with thin liquid output in bag Ext - no edema, non-tender Neuro - alert & oriented, no focal deficits  Lab Results:   Recent Labs  06/15/14 0534 06/16/14 0430  WBC 15.1* 16.9*  HGB 7.1* 7.3*  HCT 23.3* 23.8*  PLT 536* 577*   BMET  Recent Labs  06/15/14 0534 06/16/14 0430  NA 145 144  K 3.5 3.5  CL 107 106  CO2 30 29  GLUCOSE 138* 136*  BUN 31* 32*  CREATININE 0.68 0.76  CALCIUM 7.9* 8.6   PT/INR No results for input(s): LABPROT, INR in the last 72 hours. Comprehensive Metabolic Panel:    Component Value Date/Time   NA 144 06/16/2014 0430   NA 145 06/15/2014 0534   K 3.5 06/16/2014 0430   K 3.5 06/15/2014 0534   CL 106 06/16/2014 0430   CL 107 06/15/2014 0534   CO2 29 06/16/2014 0430   CO2 30 06/15/2014 0534   BUN 32* 06/16/2014 0430   BUN 31* 06/15/2014 0534   CREATININE 0.76 06/16/2014 0430   CREATININE 0.68 06/15/2014 0534   GLUCOSE 136* 06/16/2014  0430   GLUCOSE 138* 06/15/2014 0534   CALCIUM 8.6 06/16/2014 0430   CALCIUM 7.9* 06/15/2014 0534   AST 62* 06/04/2014 1030   AST 24 06/02/2014 1703   ALT 18 06/04/2014 1030   ALT 13 06/02/2014 1703   ALKPHOS 50 06/04/2014 1030   ALKPHOS 72 06/02/2014 1703   BILITOT 0.7 06/04/2014 1030   BILITOT 0.3 06/02/2014 1703   PROT 5.3* 06/04/2014 1030   PROT 7.2 06/02/2014 1703   ALBUMIN 2.3* 06/04/2014 1030   ALBUMIN 3.8 06/02/2014 1703    Studies/Results: Dg Chest Port 1 View  06/16/2014   CLINICAL DATA:  Intubation, endotracheal tube, coronary artery disease post MI, smoker  EXAM: PORTABLE CHEST - 1 VIEW  COMPARISON:  Portable exam 0445 hours compared to 06/15/2014  FINDINGS: Tip of endotracheal tube projects 3.1 cm above carina.  Nasogastric tube extends into stomach.  LEFT jugular central venous catheter tip projects over mid SVC.  Normal heart size, mediastinal contours and pulmonary vascularity.  Diffuse BILATERAL pulmonary infiltrates little changed.  Slightly decreased atelectasis RIGHT mid lung.  Persistent LEFT pleural effusion and LEFT lower lobe atelectasis versus consolidation.  No pneumothorax.  IMPRESSION: Persistent pulmonary infiltrates, LEFT pleural effusion and LEFT lower lobe atelectasis versus consolidation.  Decreased subsegmental atelectasis RIGHT mid lung.   Electronically Signed  By: Lavonia Dana M.D.   On: 06/16/2014 07:43   Dg Chest Port 1 View  06/15/2014   CLINICAL DATA:  60 year old female with respiratory failure.  ARDS  EXAM: PORTABLE CHEST - 1 VIEW  COMPARISON:  06/13/2014 and prior radiographs  FINDINGS: The cardiomediastinal silhouette is unremarkable.  An endotracheal tube with tip 2.5 cm above the carina, left IJ central venous catheter with tip overlying the mid SVC, and NG tube with tip overlying the proximal -mid stomach is again noted.  Bilateral airspace and interstitial opacities are again noted.  A small left pleural effusion and left basilar atelectasis/  consolidation is again identified.  There is no evidence of pneumothorax.  IMPRESSION: No significant changes. Support apparatus described with continued bilateral airspace and interstitial opacities, small left pleural effusion and left basilar atelectasis/ consolidation.   Electronically Signed   By: Margarette Canada M.D.   On: 06/15/2014 08:33    Anti-infectives: Anti-infectives    Start     Dose/Rate Route Frequency Ordered Stop   06/15/14 1000  micafungin (MYCAMINE) 100 mg in sodium chloride 0.9 % 100 mL IVPB     100 mg 100 mL/hr over 1 Hours Intravenous Daily 06/15/14 0906 06/22/14 0959   06/06/14 1200  micafungin (MYCAMINE) 100 mg in sodium chloride 0.9 % 100 mL IVPB  Status:  Discontinued     100 mg 100 mL/hr over 1 Hours Intravenous Daily 06/06/14 0944 06/13/14 1025   06/05/14 1000  vancomycin (VANCOCIN) 500 mg in sodium chloride 0.9 % 100 mL IVPB  Status:  Discontinued     500 mg 100 mL/hr over 60 Minutes Intravenous Every 12 hours 06/05/14 0839 06/11/14 1048   06/04/14 1400  vancomycin (VANCOCIN) IVPB 1000 mg/200 mL premix  Status:  Discontinued     1,000 mg 200 mL/hr over 60 Minutes Intravenous Every 24 hours 06/04/14 1313 06/05/14 0839   06/03/14 0200  piperacillin-tazobactam (ZOSYN) IVPB 3.375 g  Status:  Discontinued     3.375 g 12.5 mL/hr over 240 Minutes Intravenous Every 8 hours 06/02/14 2346 06/13/14 1025   06/02/14 1845  piperacillin-tazobactam (ZOSYN) IVPB 3.375 g     3.375 g 12.5 mL/hr over 240 Minutes Intravenous  Once 06/02/14 1833 06/02/14 2257      Assessment & Plans: Colonic perforation from Crohns disease, s/p Ex Lap, partial colectomy/colostomy   IV Zosyn, Vanco  Wound care - VAC  TF's  Acute respiratory failure  ARDS management per CCM   Earnstine Regal, MD, [facility] Office: [place] 06/16/2014

## 2014-06-16 NOTE — Progress Notes (Signed)
160mL of Fentanyl gtt that was D/C'ed wasted in sink with Wandra Scot, RN

## 2014-06-16 NOTE — Care Management Note (Signed)
CARE MANAGEMENT NOTE 06/16/2014  Patient:  [name], [name]   Account Number:  [id number]  Date Initiated:  06/06/2014  Documentation initiated by:  DAVIS,RHONDA  Subjective/Objective Assessment:   had outpt colonoscopy sustained perforated bowel and was taken to or for colostomy has hx of Crohn disease     Action/Plan:   home when stable   Anticipated DC Date:  06/19/2014   Anticipated DC Plan:  [place]  In-house referral  NA      DC Planning Services  CM consult      PAC Choice  NA   Choice offered to / List presented to:  NA           Status of service:  In process, will continue to follow Medicare Important Message given?   (If response is "NO", the following Medicare IM given date fields will be blank) Date Medicare IM given:   Medicare IM given by:   Date Additional Medicare IM given:   Additional Medicare IM given by:    Discharge Disposition:    Per UR Regulation:  Reviewed for med. necessity/level of care/duration of stay  If discussed at [place] of Stay Meetings, dates discussed:   06/10/2014  06/12/2014    Comments:  June 16, 2014/Rhonda L. Rosana Hoes, RN, BSN, CCM. Case Management [place] [phone] No discharge needs present of time of review. remains on vent and ROSE study.  Unresponsive to most stimuli will open eyes but not purposeful.  June 12, 2014/Rhonda L. Rosana Hoes, RN, BSN, CCM. Case Management [place] [phone] No discharge needs present of time of review.   [id number] Rosana Hoes RN, BSN, CCN: [phone] Case management. Chart reviewed for discharge planning and present needs. Discharge needs: none present at time of review Remains on full vent support/ Iv nimbex per the ARDS Protocol study.  [id number] Rosana Hoes RN, BSN, CCN: [phone] Case management. Chart reviewed for discharge planning and present needs. Discharge needs: none present at time of  review.   June 03, 2014/Rhonda L. Rosana Hoes, RN, BSN, CCM. Case Management [place] [phone] No discharge needs present of time of review.

## 2014-06-16 NOTE — Progress Notes (Addendum)
eLink Physician-Brief Progress Note Patient Name: [name] DOB: [birth year] MRN: [id number]   Date of Service  06/16/2014  HPI/Events of Note  Called d/t 2 issues: EKG with ischemic looking ST-T segments and patient is agitated with increased RR. Fentanyl and Precedex IV infusions were D/Ced this AM.   eICU Interventions  Will order: 1. Troponin I now and Q 6 hours X 2 (total 3 sets). 2. Restart Precedex IV infusion and titrate to RASS = 0.      Intervention Category Minor Interventions: Agitation / anxiety - evaluation and management  Sommer,Steven Eugene 06/16/2014, 3:08 PM

## 2014-06-16 NOTE — Progress Notes (Signed)
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 and IBD immunsupprssed on humira, ? Prednisone and sulfasalazine. Admitted 3/28 with colonic bowel perforation and s/p ex-lap with partial colectomy / resection of colostomy & creation of new colostomy 3/28.  On 3/30, she had sudden respiratory decompensation requiring emergent intubation.  Clear CXR on admit.  Progressed to ARDS 3/31.     SIGNIFICANT EVENTS: 3/28  Admit 3/30  PCCM consult for abrupt resp decline 3/31  ARDS NET + Start  NIH ROSE Trial (Re-evaluaton Of Systemic Early NMB) - randomized to sedation only Arm  4/03  LUE arterial and venous dopplers >> negative 4/05  Weaned to 0.40, PEEP 8 4/06  Vt liberalized to 8cc/kg 06/13/14: PEEP down to 5, Vt liberalized to 8cc/kg on 4/6 06/14/14: . Awake but not following commands. On Precedex gtt + fent gtt 140mcg and doing PSV on it; this is half the dose of fent earlier. Tolerating TF. Making urine. Febrile last night - part of ongoping fevers. ALine removed  SUBJECTIVE/OVERNIGHT/INTERVAL HX Febrile  Responsive Failed SBT due to high RR   VITAL SIGNS: Temp:  [98.4 F (36.9 C)-99.9 F (37.7 C)] 99.9 F (37.7 C) (04/11 0745) Pulse Rate:  [65-97] 88 (04/11 0830) Resp:  [31-32] 31 (04/11 0313) BP: (90-140)/(37-66) 106/63 mmHg (04/11 0800) SpO2:  [94 %-99 %] 98 % (04/11 0830) FiO2 (%):  [30 %] 30 % (04/11 0830) Weight:  [48.1 kg (106 lb 0.7 oz)] 48.1 kg (106 lb 0.7 oz) (04/11 0500)   HEMODYNAMICS: CVP:  [6 mmHg-9 mmHg] 7 mmHg   VENTILATOR SETTINGS: Vent Mode:  [-] PRVC FiO2 (%):  [30 %] 30 % Set Rate:  [20 bmp] 20 bmp Vt Set:  [360 mL] 360  mL PEEP:  [5 cmH20] 5 cmH20 Pressure Support:  [10 cmH20] 10 cmH20 Plateau Pressure:  [21 cmH20-25 cmH20] 23 cmH20   INTAKE / OUTPUT:  Intake/Output Summary (Last 24 hours) at 06/16/14 0836 Last data filed at 06/16/14 0830  Gross per 24 hour  Intake 2546.6 ml  Output   3600 ml  Net -1053.4 ml    PHYSICAL EXAMINATION: General:  Critically ill looking female Neuro: Off drips,CAM-IC neg for delirium. Movesl all 4s. rASS 0 HEENT:  ET tube + NGT Cardiovascular:  Tachycardic, Normal heart sounds Lungs:  Decreased bs+ Abdomen:  Soft, dressing intact, vac in place Musculoskeletal:  L arm remains cool, able to doppler pulses Skin:  Intact anteriorly  LABS:  PULMONARY  Recent Labs Lab 06/11/14 1145 06/11/14 1429 06/11/14 1557 06/12/14 1000  PHART 7.493* 7.495* 7.489* 7.481*  PCO2ART 56.0* 55.4* 58.8* 53.1*  PO2ART 70.6* 92.2 89.5 75.2*  HCO3 42.5* 42.3* 44.3* 39.2*  TCO2 38.1 39.1 40.6 36.9  O2SAT 92.5 96.3 95.6 94.0   CBC  Recent Labs Lab 06/14/14 0500 06/15/14 0534 06/16/14 0430  HGB 7.2* 7.1* 7.3*  HCT 23.7* 23.3* 23.8*  WBC 18.1* 15.1* 16.9*  PLT 443* 536* 577*   COAGULATION No results for input(s): INR in the last 168 hours. CARDIAC No results for input(s): TROPONINI in the last 168 hours. No results for input(s): PROBNP in the last 168  hours.  CHEMISTRY  Recent Labs Lab 06/10/14 0300  06/12/14 0420 06/13/14 0350 06/14/14 0500 06/15/14 0534 06/16/14 0430  NA 146*  < > 150* 148* 144 145 144  K 2.1*  < > 3.3* 3.9 3.9 3.5 3.5  CL 95*  < > 101 105 109 107 106  CO2 39*  < > 40* 34* 28 30 29   GLUCOSE 126*  < > 107* 118* 131* 138* 136*  BUN 28*  < > 28* 33* 24* 31* 32*  CREATININE 0.82  < > 0.68 0.71 0.60 0.68 0.76  CALCIUM 7.9*  < > 8.1* 7.8* 8.0* 7.9* 8.6  MG 1.6  --  1.9  --   --  1.9 2.2  PHOS 3.5  --  2.2*  --   --  3.2 3.7  < > = values in this interval not displayed. Estimated Creatinine Clearance: 54.4 mL/min (by C-G formula based on Cr of  0.76).  LIVER No results for input(s): AST, ALT, ALKPHOS, BILITOT, PROT, ALBUMIN, INR in the last 168 hours. INFECTIOUS  Recent Labs Lab 06/13/14 0350 06/14/14 1303 06/15/14 0534  PROCALCITON 0.66 0.48 0.37   ENDOCRINE CBG (last 3)   Recent Labs  06/15/14 1224 06/15/14 1610 06/15/14 2331  GLUCAP 110* 132* 116*     IMAGING x48h Dg Chest Port 1 View  06/16/2014   CLINICAL DATA:  Intubation, endotracheal tube, coronary artery disease post MI, smoker  EXAM: PORTABLE CHEST - 1 VIEW  COMPARISON:  Portable exam 0445 hours compared to 06/15/2014  FINDINGS: Tip of endotracheal tube projects 3.1 cm above carina.  Nasogastric tube extends into stomach.  LEFT jugular central venous catheter tip projects over mid SVC.  Normal heart size, mediastinal contours and pulmonary vascularity.  Diffuse BILATERAL pulmonary infiltrates little changed.  Slightly decreased atelectasis RIGHT mid lung.  Persistent LEFT pleural effusion and LEFT lower lobe atelectasis versus consolidation.  No pneumothorax.  IMPRESSION: Persistent pulmonary infiltrates, LEFT pleural effusion and LEFT lower lobe atelectasis versus consolidation.  Decreased subsegmental atelectasis RIGHT mid lung.   Electronically Signed   By: Lavonia Dana M.D.   On: 06/16/2014 07:43   Dg Chest Port 1 View  06/15/2014   CLINICAL DATA:  59 year old female with respiratory failure.  ARDS  EXAM: PORTABLE CHEST - 1 VIEW  COMPARISON:  06/13/2014 and prior radiographs  FINDINGS: The cardiomediastinal silhouette is unremarkable.  An endotracheal tube with tip 2.5 cm above the carina, left IJ central venous catheter with tip overlying the mid SVC, and NG tube with tip overlying the proximal -mid stomach is again noted.  Bilateral airspace and interstitial opacities are again noted.  A small left pleural effusion and left basilar atelectasis/ consolidation is again identified.  There is no evidence of pneumothorax.  IMPRESSION: No significant changes. Support  apparatus described with continued bilateral airspace and interstitial opacities, small left pleural effusion and left basilar atelectasis/ consolidation.   Electronically Signed   By: Margarette Canada M.D.   On: 06/15/2014 08:33    ASSESSMENT / PLAN:  PULMONARY OETT  06/04/2014 >> A:  Acute Respiratory failure due to ARDS - s/p ROSE NIH PETAL Trial - randomized to sedation only arm.    P:   SBT as tolerated   CARDIOVASCULAR CVL L IJ 3/30>> A:  CAD, Hx of MI s/p stents in 1980s during age 31s Shock, presumed septic shock due to peritonitis Cool L UE, still has pulses ; doppler negative arterial and venous   - off pressors.  P:  Diuresis since 06/13/14 to continue - reassess need daily Continue lasix - goal CVP 4- 8  RENAL A:   Intact but at risk for AKI   - mild low K and mild low mag. On free water for previous hypernatremia. Also on lasix. ? Net 11L negative since 4/4/416   P:   Follow BMP Replace electrolytes as indicated Continue low dose free water since 06/12/14  Continue lasix  GASTROINTESTINAL A:   Crohn's disease, chronic immunosuppresion Perforation s/p surgical resection Peritonitis  P:   Appreciate CCS and GI recs Tolerating TF Wound vac being changed MWF  HEMATOLOGIC A:    anemia of critical illness P:  PRBC per ICU guidelines  Only if < 7gm%   INFECTIOUS BCx2 >> negative UC >> negative Tracheal aspirate for bacteria and virus 06/04/14 >> few candida albicans RVP >> negative ................ Blood 06/14/14>> BAL 06/14/14 Urine 06/14/14>>  A:   Baseline  - Immunocmpromised patient due to humira + prednisone Current   - admit with bowel perf / peritonitis 06/02/14  - pulmonary event -06/04/14 ARDS - trach aspirate with few candida   - Ongoing fevers 4/102016. OFf all antimicrobials as of 06/13/14 but still with fever     P:   dc central line , place  PICC line - ordered 06/15/2014 resp cx 06/14/2014  >>ng bld 4/9 >> ng Urine 4/9 >> ng Restart  micafungn - total 2 weeks needed  Per guidelines (immunsuppressed + peritonitis + fe candida in BAL 06/04/14) .............Marland Kitchen Zosyn 3/29>> (plan 10-14 days) Vanc 3/31 >> 4/6 Micafungin 06/06/14 (candida in BAL, peritonitis) >>06/13/14, 06/15/14 >. 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  None at bedside 4/11    The patient is critically ill with multiple organ systems failure and requires high complexity decision making for assessment and support, frequent evaluation and titration of therapies, application of advanced monitoring technologies and extensive interpretation of multiple databases.   Critical Care Time devoted to patient care services described in this note is  35  Minutes. This time reflects time of care of this signee Dr Brand Males. This critical care time does not reflect procedure time, or teaching time or supervisory time of PA/NP/Med student/Med Resident etc but could involve care discussion time   Kara Mead MD. FCCP. [facility] Pager [phone] If no response call [phone]    06/16/2014 8:36 AM

## 2014-06-17 LAB — BASIC METABOLIC PANEL
Anion gap: 10 (ref 5–15)
BUN: 38 mg/dL — ABNORMAL HIGH (ref 6–23)
CO2: 25 mmol/L (ref 19–32)
Calcium: 8.8 mg/dL (ref 8.4–10.5)
Chloride: 108 mmol/L (ref 96–112)
Creatinine, Ser: 0.78 mg/dL (ref 0.50–1.10)
GFR, EST NON AFRICAN AMERICAN: 90 mL/min — AB (ref >90)
Glucose, Bld: 119 mg/dL — ABNORMAL HIGH (ref 70–99)
POTASSIUM: 3.7 mmol/L (ref 3.5–5.1)
SODIUM: 143 mmol/L (ref 135–145)

## 2014-06-17 LAB — CBC WITH DIFFERENTIAL/PLATELET
Basophils Absolute: 0 10*3/uL (ref 0.0–0.1)
Basophils Relative: 0 % (ref 0–1)
Eosinophils Absolute: 0.2 10*3/uL (ref 0.0–0.7)
Eosinophils Relative: 1 % (ref 0–5)
HEMATOCRIT: 27.6 % — AB (ref 36.0–46.0)
HEMOGLOBIN: 8.4 g/dL — AB (ref 12.0–15.0)
LYMPHS PCT: 14 % (ref 12–46)
Lymphs Abs: 2.6 10*3/uL (ref 0.7–4.0)
MCH: 29.2 pg (ref 26.0–34.0)
MCHC: 30.4 g/dL (ref 30.0–36.0)
MCV: 95.8 fL (ref 78.0–100.0)
Monocytes Absolute: 1.7 10*3/uL — ABNORMAL HIGH (ref 0.1–1.0)
Monocytes Relative: 9 % (ref 3–12)
Neutro Abs: 14 10*3/uL — ABNORMAL HIGH (ref 1.7–7.7)
Neutrophils Relative %: 76 % (ref 43–77)
Platelets: 800 10*3/uL — ABNORMAL HIGH (ref 150–400)
RBC: 2.88 MIL/uL — ABNORMAL LOW (ref 3.87–5.11)
RDW: 15.1 % (ref 11.5–15.5)
WBC: 18.5 10*3/uL — ABNORMAL HIGH (ref 4.0–10.5)

## 2014-06-17 LAB — GLUCOSE, CAPILLARY
GLUCOSE-CAPILLARY: 124 mg/dL — AB (ref 70–99)
GLUCOSE-CAPILLARY: 147 mg/dL — AB (ref 70–99)
GLUCOSE-CAPILLARY: 111 mg/dL — AB (ref 70–99)
GLUCOSE-CAPILLARY: 125 mg/dL — AB (ref 70–99)
Glucose-Capillary: 117 mg/dL — ABNORMAL HIGH (ref 70–99)
Glucose-Capillary: 127 mg/dL — ABNORMAL HIGH (ref 70–99)
Glucose-Capillary: 117 mg/dL — ABNORMAL HIGH (ref 70–99)
Glucose-Capillary: 115 mg/dL — ABNORMAL HIGH (ref 70–99)

## 2014-06-17 LAB — CULTURE, RESPIRATORY W GRAM STAIN

## 2014-06-17 LAB — CULTURE, RESPIRATORY: CULTURE: NO GROWTH

## 2014-06-17 LAB — PHOSPHORUS: PHOSPHORUS: 4.4 mg/dL (ref 2.3–4.6)

## 2014-06-17 LAB — TROPONIN I: Troponin I: 0.08 ng/mL — ABNORMAL HIGH

## 2014-06-17 LAB — MAGNESIUM: Magnesium: 2.4 mg/dL (ref 1.5–2.5)

## 2014-06-17 LAB — CLOSTRIDIUM DIFFICILE BY PCR: CDIFFPCR: NEGATIVE

## 2014-06-17 MED ORDER — PANTOPRAZOLE SODIUM 40 MG PO PACK
40.0000 mg | PACK | Freq: Every day | ORAL | Status: DC
  Filled 2014-06-17: qty 20

## 2014-06-17 MED ORDER — SODIUM CHLORIDE 0.9 % IJ SOLN
10.0000 mL | Freq: Two times a day (BID) | INTRAMUSCULAR | Status: DC
  Administered 2014-06-17 – 2014-06-20 (×5): 10 mL

## 2014-06-17 MED ORDER — CLONAZEPAM 0.5 MG PO TABS
0.5000 mg | ORAL_TABLET | Freq: Every day | ORAL | Status: DC
  Administered 2014-06-18: 0.5 mg
  Filled 2014-06-17: qty 1

## 2014-06-17 MED ORDER — RANITIDINE HCL 150 MG/10ML PO SYRP
150.0000 mg | ORAL_SOLUTION | Freq: Every day | ORAL | Status: DC
  Administered 2014-06-17 – 2014-06-22 (×6): 150 mg
  Filled 2014-06-17 (×9): qty 10

## 2014-06-17 MED ORDER — SODIUM CHLORIDE 0.9 % IJ SOLN
10.0000 mL | INTRAMUSCULAR | Status: DC | PRN
  Administered 2014-06-21 – 2014-06-23 (×4): 10 mL
  Filled 2014-06-17 (×4): qty 40

## 2014-06-17 NOTE — Progress Notes (Signed)
Peripherally Inserted Central Catheter/Midline Placement  The IV Nurse has discussed with the patient and/or persons authorized to consent for the patient, the purpose of this procedure and the potential benefits and risks involved with this procedure.  The benefits include less needle sticks, lab draws from the catheter and patient may be discharged home with the catheter.  Risks include, but not limited to, infection, bleeding, blood clot (thrombus formation), and puncture of an artery; nerve damage and irregular heat beat.  Alternatives to this procedure were also discussed.  PICC/Midline Placement Documentation    Bedside nurse obtained phone consent from daughter on 06/16/14    Murvin Natal 06/17/2014, 12:09 PM

## 2014-06-17 NOTE — Procedures (Signed)
Extubation Procedure Note  Patient Details:   Name: [name] DOB: 1955-12-09 MRN: [id number]   Airway Documentation:     Evaluation  O2 sats: stable throughout Complications: No apparent complications Patient did tolerate procedure well. Bilateral Breath Sounds: Rhonchi Suctioning: Airway Yes  Tamala Julian 06/17/2014, 9:37 AM

## 2014-06-17 NOTE — Progress Notes (Signed)
PULMONARY / CRITICAL CARE MEDICINE   Name: [name] MRN: [id number] DOB: 20-Apr-1955   PCP Nance Pear., NP   ADMISSION DATE:  06/02/2014 CONSULTATION DATE:  06/04/2014   REFERRING MD :  Dr Leighton Ruff  BRIEF SUMMARY:  59 y/o F, smoker, with hx of MI in the 1980s with s.p stent (details not known), prolonged ICU stay for septic shock from PNA/cellulitis (2009) at [facility] and IBD immunsupprssed on humira, ? Prednisone and sulfasalazine. Admitted 3/28 with colonic bowel perforation and s/p ex-lap with partial colectomy / resection of colostomy & creation of new colostomy 3/28.  On 3/30, she had sudden respiratory decompensation requiring emergent intubation.  Clear CXR on admit.  Progressed to ARDS 3/31.     SIGNIFICANT EVENTS: 3/28  Admit 3/30  PCCM consult for abrupt resp decline 3/31  ARDS NET + Start  NIH ROSE Trial (Re-evaluaton Of Systemic Early NMB) - randomized to sedation only Arm  4/03  LUE arterial and venous dopplers >> negative 4/05  Weaned to 0.40, PEEP 8 4/06  Vt liberalized to 8cc/kg 06/13/14: PEEP down to 5, Vt liberalized to 8cc/kg on 4/6 06/14/14: . Awake but not following commands. On Precedex gtt + fent gtt 152mcg and doing PSV on it; this is half the dose of fent earlier. Tolerating TF. Making urine. Febrile last night - part of ongoping fevers. ALine removed 4/11 precedex resumed  SUBJECTIVE/OVERNIGHT/INTERVAL HX Febrile  Tolerating PS 5/5   VITAL SIGNS: Temp:  [98.8 F (37.1 C)-101.8 F (38.8 C)] 98.8 F (37.1 C) (04/12 0800) Pulse Rate:  [90-136] 114 (04/12 0800) Resp:  [24-28] 28 (04/12 0342) BP: (71-192)/(37-83) 132/68 mmHg (04/12 0800) SpO2:  [96 %-100 %] 97 % (04/12 0800) FiO2 (%):  [30 %] 30 % (04/12 0815) Weight:  [45 kg (99 lb 3.3 oz)] 45 kg (99 lb 3.3 oz) (04/12 0500)   HEMODYNAMICS:     VENTILATOR SETTINGS: Vent Mode:  [-] PRVC FiO2 (%):  [30 %] 30 % Set Rate:  [20 bmp] 20 bmp Vt Set:  [360 mL] 360 mL PEEP:  [5 cmH20] 5  cmH20 Plateau Pressure:  [18 cmH20-27 cmH20] 19 cmH20   INTAKE / OUTPUT:  Intake/Output Summary (Last 24 hours) at 06/17/14 0920 Last data filed at 06/17/14 0900  Gross per 24 hour  Intake 2869.78 ml  Output   5540 ml  Net -2670.22 ml    PHYSICAL EXAMINATION: General:  Critically ill looking female Neuro: Off drips,CAM-IC neg for delirium. Moves all 4s. RASS 0 HEENT:  ET tube + NGT Cardiovascular:  Tachycardic, Normal heart sounds Lungs:  Decreased bs+ Abdomen:  Soft, dressing intact, vac in place Musculoskeletal:  L arm remains cool, able to doppler pulses Skin:  Intact anteriorly  LABS:  PULMONARY  Recent Labs Lab 06/11/14 1145 06/11/14 1429 06/11/14 1557 06/12/14 1000  PHART 7.493* 7.495* 7.489* 7.481*  PCO2ART 56.0* 55.4* 58.8* 53.1*  PO2ART 70.6* 92.2 89.5 75.2*  HCO3 42.5* 42.3* 44.3* 39.2*  TCO2 38.1 39.1 40.6 36.9  O2SAT 92.5 96.3 95.6 94.0   CBC  Recent Labs Lab 06/15/14 0534 06/16/14 0430 06/17/14 0300  HGB 7.1* 7.3* 8.4*  HCT 23.3* 23.8* 27.6*  WBC 15.1* 16.9* 18.5*  PLT 536* 577* 800*   COAGULATION No results for input(s): INR in the last 168 hours. CARDIAC  Recent Labs Lab 06/16/14 1605 06/16/14 2050 06/17/14 0300  TROPONINI 0.06* 0.09* 0.08*   No results for input(s): PROBNP in the last 168 hours.  CHEMISTRY  Recent Labs  Lab 06/12/14 0420 06/13/14 0350 06/14/14 0500 06/15/14 0534 06/16/14 0430 06/17/14 0300  NA 150* 148* 144 145 144 143  K 3.3* 3.9 3.9 3.5 3.5 3.7  CL 101 105 109 107 106 108  CO2 40* 34* 28 30 29 25   GLUCOSE 107* 118* 131* 138* 136* 119*  BUN 28* 33* 24* 31* 32* 38*  CREATININE 0.68 0.71 0.60 0.68 0.76 0.78  CALCIUM 8.1* 7.8* 8.0* 7.9* 8.6 8.8  MG 1.9  --   --  1.9 2.2 2.4  PHOS 2.2*  --   --  3.2 3.7 4.4   Estimated Creatinine Clearance: 53.8 mL/min (by C-G formula based on Cr of 0.78).  LIVER No results for input(s): AST, ALT, ALKPHOS, BILITOT, PROT, ALBUMIN, INR in the last 168  hours. INFECTIOUS  Recent Labs Lab 06/14/14 1303 06/15/14 0534 06/16/14 0430  PROCALCITON 0.48 0.37 0.25   ENDOCRINE CBG (last 3)   Recent Labs  06/16/14 1956 06/16/14 2316 06/17/14 0747  GLUCAP 109* 124* 147*     IMAGING x48h Dg Chest Port 1 View  06/16/2014   CLINICAL DATA:  Intubation, endotracheal tube, coronary artery disease post MI, smoker  EXAM: PORTABLE CHEST - 1 VIEW  COMPARISON:  Portable exam 0445 hours compared to 06/15/2014  FINDINGS: Tip of endotracheal tube projects 3.1 cm above carina.  Nasogastric tube extends into stomach.  LEFT jugular central venous catheter tip projects over mid SVC.  Normal heart size, mediastinal contours and pulmonary vascularity.  Diffuse BILATERAL pulmonary infiltrates little changed.  Slightly decreased atelectasis RIGHT mid lung.  Persistent LEFT pleural effusion and LEFT lower lobe atelectasis versus consolidation.  No pneumothorax.  IMPRESSION: Persistent pulmonary infiltrates, LEFT pleural effusion and LEFT lower lobe atelectasis versus consolidation.  Decreased subsegmental atelectasis RIGHT mid lung.   Electronically Signed   By: Lavonia Dana M.D.   On: 06/16/2014 07:43    ASSESSMENT / PLAN:  PULMONARY OETT  06/04/2014 >> A:  Acute Respiratory failure due to ARDS - s/p ROSE NIH PETAL Trial - randomized to sedation only arm.    P:   SBT as tolerated - Trial of extubation   CARDIOVASCULAR CVL L IJ 3/30>>4/11  A:  CAD, Hx of MI s/p stents in 1980s during age 18s Shock, presumed septic shock due to peritonitis Cool L UE, still has pulses ; doppler negative arterial and venous   - off pressors.   P:  Diuresis since 06/13/14 to continue - reassess need daily Continue lasix - goal CVP 4- 8  RENAL A:   Intact but at risk for AKI  - mild low K and mild low mag. On free water for previous hypernatremia. ? Net 11L negative since 4/4/416   P:   Follow BMP Replace electrolytes as indicated Continue low dose free water  since 06/12/14  hold lasix for soft bP  GASTROINTESTINAL A:   Crohn's disease, chronic immunosuppresion Perforation s/p surgical resection Peritonitis  P:   Appreciate CCS and GI recs hold TF Wound vac being changed MWF  HEMATOLOGIC A:    anemia of critical illness P:  PRBC per ICU guidelines  Only if < 7gm%   INFECTIOUS BCx2 >> negative UC >> negative Tracheal aspirate for bacteria and virus 06/04/14 >> few candida albicans RVP >> negative ................ Blood 06/14/14>>ng BAL 06/14/14 >> ng Urine 06/14/14>>ng  A:   Baseline  - Immunocmpromised patient due to humira + prednisone Current   - admit with bowel perf / peritonitis 06/02/14  - pulmonary event -  06/04/14 ARDS - trach aspirate with few candida   - Ongoing fevers 4/102016. Off all antimicrobials as of 06/13/14 but still with fever     P:   dc central line , place  PICC line - ordered 06/15/2014 resp cx 06/14/2014  >>ng bld 4/9 >> ng Urine 4/9 >> ng Restart micafungn - total 2 weeks needed  Per guidelines (immunsuppressed + peritonitis + fe candida in BAL 06/04/14) .............Marland Kitchen Zosyn 3/29>> (plan 10-14 days) Vanc 3/31 >> 4/6 Micafungin 06/06/14 (candida in BAL, peritonitis) >>06/13/14, 06/15/14 >. 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  None at bedside 4/11   The patient is critically ill with multiple organ systems failure and requires high complexity decision making for assessment and support, frequent evaluation and titration of therapies, application of advanced  monitoring technologies and extensive interpretation of multiple databases. Critical Care Time devoted to patient care services described in this note independent of APP time is 35 minutes.     Kara Mead MD. Shade Flood. [place] Pager [phone] If no response call [phone]    06/17/2014 9:20 AM

## 2014-06-17 NOTE — Progress Notes (Signed)
Patient ID: [name], female   DOB: May 09, 1955, 59 y.o.   MRN: [id number]  [facility]  POD#: 15  Subjective: Patient awake, responsive.  No complaints.  Discussed possible need for trach.  Objective: Vital signs in last 24 hours: Temp:  [99.5 F (37.5 C)-101.8 F (38.8 C)] 99.5 F (37.5 C) (04/12 0400) Pulse Rate:  [88-136] 103 (04/12 0700) Resp:  [24-28] 28 (04/12 0342) BP: (71-192)/(37-83) 112/61 mmHg (04/12 0700) SpO2:  [96 %-100 %] 99 % (04/12 0700) FiO2 (%):  [30 %] 30 % (04/12 0600) Weight:  [45 kg (99 lb 3.3 oz)] 45 kg (99 lb 3.3 oz) (04/12 0500) Last BM Date: 06/16/14  Intake/Output from previous day: 04/11 0701 - 04/12 0700 In: 2847.3 [I.V.:337.3; NG/GT:1410; IV Piggyback:1100] Out: 5340 [Urine:2915; Drains:75; Stool:2350] Intake/Output this shift:    Physical Exam: HEENT - sclerae clear, mucous membranes moist Neck - soft, CVP site left neck clear Abdomen - VAC to midline wound intact; stoma viable with moderate liquid stool in bag Ext - no edema, non-tender Neuro - alert & oriented, no focal deficits  Lab Results:   Recent Labs  06/16/14 0430 06/17/14 0300  WBC 16.9* 18.5*  HGB 7.3* 8.4*  HCT 23.8* 27.6*  PLT 577* 800*   BMET  Recent Labs  06/16/14 0430 06/17/14 0300  NA 144 143  K 3.5 3.7  CL 106 108  CO2 29 25  GLUCOSE 136* 119*  BUN 32* 38*  CREATININE 0.76 0.78  CALCIUM 8.6 8.8   PT/INR No results for input(s): LABPROT, INR in the last 72 hours. Comprehensive Metabolic Panel:    Component Value Date/Time   NA 143 06/17/2014 0300   NA 144 06/16/2014 0430   K 3.7 06/17/2014 0300   K 3.5 06/16/2014 0430   CL 108 06/17/2014 0300   CL 106 06/16/2014 0430   CO2 25 06/17/2014 0300   CO2 29 06/16/2014 0430   BUN 38* 06/17/2014 0300   BUN 32* 06/16/2014 0430   CREATININE 0.78 06/17/2014 0300   CREATININE 0.76 06/16/2014 0430   GLUCOSE 119* 06/17/2014 0300   GLUCOSE 136* 06/16/2014 0430   CALCIUM 8.8 06/17/2014 0300   CALCIUM 8.6 06/16/2014 0430   AST 62* 06/04/2014 1030   AST 24 06/02/2014 1703   ALT 18 06/04/2014 1030   ALT 13 06/02/2014 1703   ALKPHOS 50 06/04/2014 1030   ALKPHOS 72 06/02/2014 1703   BILITOT 0.7 06/04/2014 1030   BILITOT 0.3 06/02/2014 1703   PROT 5.3* 06/04/2014 1030   PROT 7.2 06/02/2014 1703   ALBUMIN 2.3* 06/04/2014 1030   ALBUMIN 3.8 06/02/2014 1703    Studies/Results: Dg Chest Port 1 View  06/16/2014   CLINICAL DATA:  Intubation, endotracheal tube, coronary artery disease post MI, smoker  EXAM: PORTABLE CHEST - 1 VIEW  COMPARISON:  Portable exam 0445 hours compared to 06/15/2014  FINDINGS: Tip of endotracheal tube projects 3.1 cm above carina.  Nasogastric tube extends into stomach.  LEFT jugular central venous catheter tip projects over mid SVC.  Normal heart size, mediastinal contours and pulmonary vascularity.  Diffuse BILATERAL pulmonary infiltrates little changed.  Slightly decreased atelectasis RIGHT mid lung.  Persistent LEFT pleural effusion and LEFT lower lobe atelectasis versus consolidation.  No pneumothorax.  IMPRESSION: Persistent pulmonary infiltrates, LEFT pleural effusion and LEFT lower lobe atelectasis versus consolidation.  Decreased subsegmental atelectasis RIGHT mid lung.   Electronically Signed   By: Lavonia Dana M.D.   On: 06/16/2014  07:43    Anti-infectives: Anti-infectives    Start     Dose/Rate Route Frequency Ordered Stop   06/15/14 1000  micafungin (MYCAMINE) 100 mg in sodium chloride 0.9 % 100 mL IVPB     100 mg 100 mL/hr over 1 Hours Intravenous Daily 06/15/14 0906 06/22/14 0959   06/06/14 1200  micafungin (MYCAMINE) 100 mg in sodium chloride 0.9 % 100 mL IVPB  Status:  Discontinued     100 mg 100 mL/hr over 1 Hours Intravenous Daily 06/06/14 0944 06/13/14 1025   06/05/14 1000  vancomycin (VANCOCIN) 500 mg in sodium chloride 0.9 % 100 mL IVPB  Status:  Discontinued     500 mg 100 mL/hr over 60 Minutes Intravenous  Every 12 hours 06/05/14 0839 06/11/14 1048   06/04/14 1400  vancomycin (VANCOCIN) IVPB 1000 mg/200 mL premix  Status:  Discontinued     1,000 mg 200 mL/hr over 60 Minutes Intravenous Every 24 hours 06/04/14 1313 06/05/14 0839   06/03/14 0200  piperacillin-tazobactam (ZOSYN) IVPB 3.375 g  Status:  Discontinued     3.375 g 12.5 mL/hr over 240 Minutes Intravenous Every 8 hours 06/02/14 2346 06/13/14 1025   06/02/14 1845  piperacillin-tazobactam (ZOSYN) IVPB 3.375 g     3.375 g 12.5 mL/hr over 240 Minutes Intravenous  Once 06/02/14 1833 06/02/14 2257      Assessment & Plans: Colonic perforation from Crohns disease, s/p Ex Lap, partial colectomy/colostomy  IV Zosyn, Vanco Wound care - VAC TF's  Increased output colostomy - will check Cdiff - WBC up to 18K  Acute respiratory failure ARDS management per CCM  May need trach to facilitate weaning - discussed with Dr. Elsworth Soho - discussed with patient - will wait for [place], MD, [facility] Office: [place] 06/17/2014

## 2014-06-17 NOTE — Progress Notes (Signed)
NUTRITION FOLLOW UP  Intervention:   Continue Vital AF 1.2 @ 55 ml/hr.  Tube feeding regimen provides 1584 kcal (100% of estimated needs), 99 grams of protein, and 1071 ml of H2O.   RD to continue to monitor   Nutrition Dx:   Inadequate oral intake related to inability to eat as evidenced by NPO. ongoing  Goal:   Pt to meet >/= 90% of their estimated energy requirements. Met with TF   Monitor:   TF regimen & tolerance, respiratory status, weight, labs, I/O's  Assessment:   71 yoF admitted with colonic bowel perforation. S/p ex lap with partial colectomy 3/28. In ICU with postop respiratory failure on 3/30, requiring emergent intubation. CXR showed bilateral infiltrates suggestive of acute pulmonary edema. WBC elevated and patient is febrile. Patient has a long history of Crohn's disease with at least 2 previous partial colectomies and left-sided ostomy.  - Pt extubated 4/12. TF held.  - Per RN, TF to be re-started at 1:30 pm today. SLP eval scheduled for tomorrow.  - Weight has decreased 23 lbs since admission. Usual body weight prior to admission ~101-103 lbs.  - Pt with large amount of watery drainage in colostomy bag. C Diff negative.  - RD will continue to monitor  Height: Ht Readings from Last 1 Encounters:  06/14/14 5' (1.524 m)    Weight Status:   Wt Readings from Last 1 Encounters:  06/17/14 99 lb 3.3 oz (45 kg)   Body mass index is 19.38 kg/(m^2).  Re-estimated needs (4/8):  Kcal: 1400-1600 Protein: 85 - 95g  Fluid: per MD  Skin: incision on abdomen- wound vac in place  Diet Order: Diet NPO time specified   Intake/Output Summary (Last 24 hours) at 06/17/14 1350 Last data filed at 06/17/14 1345  Gross per 24 hour  Intake 2664.38 ml  Output   5050 ml  Net -2385.62 ml    Last BM: 4/12 (800 ml via ostomy bag)   Labs:   Recent Labs Lab 06/15/14 0534 06/16/14 0430 06/17/14 0300  NA 145 144 143  K 3.5 3.5 3.7  CL 107 106 108  CO2 $Re'30 29 25   'Goz$ BUN 31* 32* 38*  CREATININE 0.68 0.76 0.78  CALCIUM 7.9* 8.6 8.8  MG 1.9 2.2 2.4  PHOS 3.2 3.7 4.4  GLUCOSE 138* 136* 119*    CBG (last 3)   Recent Labs  06/17/14 0350 06/17/14 0747 06/17/14 1147  GLUCAP 117* 147* 111*    Scheduled Meds: . antiseptic oral rinse  7 mL Mouth Rinse QID  . aspirin  81 mg Per NG tube Daily  . chlorhexidine  15 mL Mouth Rinse BID  . [START ON 06/18/2014] clonazePAM  0.5 mg Per Tube QHS  . free water  200 mL Per Tube 4 times per day  . furosemide  20 mg Intravenous Once  . heparin subcutaneous  5,000 Units Subcutaneous 3 times per day  . hydrocortisone sod succinate (SOLU-CORTEF) inj  25 mg Intravenous Daily  . insulin aspart  2-6 Units Subcutaneous 6 times per day  . micafungin [facility]) IV  100 mg Intravenous Daily  . ranitidine  150 mg Per Tube QHS  . sodium chloride  10-40 mL Intracatheter Q12H    Continuous Infusions: . dexmedetomidine Stopped (06/17/14 1200)  . dextrose 5 % and 0.45% NaCl 10 mL/hr at 06/16/14 0845  . feeding supplement (VITAL AF 1.2 CAL) 1,000 mL (06/16/14 2047)   [name] [place], [place], [place]

## 2014-06-18 LAB — CBC WITH DIFFERENTIAL/PLATELET
Basophils Absolute: 0 10*3/uL (ref 0.0–0.1)
Basophils Relative: 0 % (ref 0–1)
EOS ABS: 0.2 10*3/uL (ref 0.0–0.7)
Eosinophils Relative: 1 % (ref 0–5)
HEMATOCRIT: 27.4 % — AB (ref 36.0–46.0)
Hemoglobin: 8.5 g/dL — ABNORMAL LOW (ref 12.0–15.0)
LYMPHS ABS: 3.3 10*3/uL (ref 0.7–4.0)
LYMPHS PCT: 16 % (ref 12–46)
MCH: 30 pg (ref 26.0–34.0)
MCHC: 31 g/dL (ref 30.0–36.0)
MCV: 96.8 fL (ref 78.0–100.0)
MONO ABS: 1.6 10*3/uL — AB (ref 0.1–1.0)
MONOS PCT: 8 % (ref 3–12)
Neutro Abs: 15.4 10*3/uL — ABNORMAL HIGH (ref 1.7–7.7)
Neutrophils Relative %: 75 % (ref 43–77)
Platelets: 877 10*3/uL — ABNORMAL HIGH (ref 150–400)
RBC: 2.83 MIL/uL — ABNORMAL LOW (ref 3.87–5.11)
RDW: 15.3 % (ref 11.5–15.5)
WBC: 20.5 10*3/uL — AB (ref 4.0–10.5)

## 2014-06-18 LAB — GLUCOSE, CAPILLARY
Glucose-Capillary: 112 mg/dL — ABNORMAL HIGH (ref 70–99)
Glucose-Capillary: 116 mg/dL — ABNORMAL HIGH (ref 70–99)
Glucose-Capillary: 116 mg/dL — ABNORMAL HIGH (ref 70–99)
Glucose-Capillary: 117 mg/dL — ABNORMAL HIGH (ref 70–99)

## 2014-06-18 LAB — MAGNESIUM: Magnesium: 2.4 mg/dL (ref 1.5–2.5)

## 2014-06-18 LAB — BASIC METABOLIC PANEL
Anion gap: 11 (ref 5–15)
BUN: 43 mg/dL — ABNORMAL HIGH (ref 6–23)
CHLORIDE: 111 mmol/L (ref 96–112)
CO2: 24 mmol/L (ref 19–32)
Calcium: 9.4 mg/dL (ref 8.4–10.5)
Creatinine, Ser: 0.66 mg/dL (ref 0.50–1.10)
GFR calc Af Amer: >90 mL/min (ref >90)
GFR calc non Af Amer: >90 mL/min (ref >90)
GLUCOSE: 120 mg/dL — AB (ref 70–99)
POTASSIUM: 3.8 mmol/L (ref 3.5–5.1)
Sodium: 146 mmol/L — ABNORMAL HIGH (ref 135–145)

## 2014-06-18 LAB — PHOSPHORUS: Phosphorus: 3.7 mg/dL (ref 2.3–4.6)

## 2014-06-18 NOTE — Evaluation (Signed)
Clinical/Bedside Swallow Evaluation Patient Details  Name: [name] MRN: [id number] Date of Birth: 1956/01/31  Today's Date: 06/18/2014 Time: SLP Start Time (ACUTE ONLY): 1515 SLP Stop Time (ACUTE ONLY): 1540 SLP Time Calculation (min) (ACUTE ONLY): 25 min  Past Medical History:  Past Medical History  Diagnosis Date  . Coronary atherosclerosis of unspecified type of vessel, native or graft   . Anal fistula   . Arthritis   . Personal history of colonic polyps   . Crohn's colitis   . History of colostomy   . Heart murmur   . Hx of myocardial infarction 1986   Past Surgical History:  Past Surgical History  Procedure Laterality Date  . Cholecystectomy    . Colon resection  1988    1988 she thinks  . Angioplasty      1987  . Appendectomy    . Tubal ligation  1993  . Colostomy  2012  . Irrigation and debridement abscess N/A 06/18/2013    Procedure: Irrigation and Debridiment of peristomal abcess;  Surgeon: Gayland Curry, MD;  Location: [facility];  Service: General;  Laterality: N/A;  . Laparotomy N/A 06/02/2014    Procedure: EXPLORATORY LAPAROTOMY/PARTIAL COLECTOMY COLOSTOMY;  Surgeon: Excell Seltzer, MD;  Location: [facility]S;  Service: General;  Laterality: N/A;   HPI:  pt is a 59 yo female adm to [facility] with colonic perforation s/p exploratory lap with partial colectomy with new colectomy.  PMH + for Crohn's dx, pna/cellulitis (09).  Pt required emergent intubation 3/30-4/12 @ 0930.  Swallow evaluation ordered.    Assessment / Plan / Recommendation Clinical Impression  Pt presents with surprisingly strong cough/voice given extended time of intubation.  No overt indication of airway compromise with intake.  Mild delay in oral transit with intake - suspect cognitive based.  Pharyngeal swallow appeared timely with pt reporting sensation of residuals with solids/puree x2- clearing with liquid.  Suspect significant impact of NG placed.  Recommend consider pulling NG tube and starting a  clear liquid diet with strict aspiration precautions.  Pt educated to findings and recommendations.  Will follow up for tolerance, pt education.     Aspiration Risk  Mild    Diet Recommendation Thin liquid (clears)   Liquid Administration via: Cup;Straw Medication Administration:  (as tolerated) Supervision: Full supervision/cueing for compensatory strategies Compensations: Slow rate;Small sips/bites Postural Changes and/or Swallow Maneuvers: Seated upright 90 degrees;Upright 30-60 min after meal    Other  Recommendations Other Recommendations: Clarify dietary restrictions   Follow Up Recommendations   (tbd)    Frequency and Duration min 1 x/week  1 week   Pertinent Vitals/Pain Afebrile, decreased     Swallow Study Prior Functional Status   see HHx    General Date of Onset: 06/02/14 HPI: pt is a 59 yo female adm to [facility] with colonic perforation s/p exploratory lap with partial colectomy with new colectomy.  PMH + for Crohn's dx, pna/cellulitis (09).  Pt required emergent intubation 3/30-4/12 @ 0930.  Swallow evaluation ordered.  Type of Study: Bedside swallow evaluation Diet Prior to this Study: NPO;Other (Comment) (NG tube in place) Temperature Spikes Noted: Yes (low grade) Respiratory Status: Nasal cannula History of Recent Intubation: No Behavior/Cognition: Alert;Cooperative;Pleasant mood Oral Cavity - Dentition: Adequate natural dentition (missing upper dentition, pt denies having dentures) Self-Feeding Abilities: Able to feed self Patient Positioning: Upright in bed Baseline Vocal Quality: Clear Volitional Cough: Weak Volitional Swallow: Able to elicit    Oral/Motor/Sensory Function Overall Oral Motor/Sensory Function: Appears within  functional limits for tasks assessed (no focal CN deficits)   Ice Chips Ice chips: Impaired Presentation: Self Fed;Spoon Oral Phase Impairments: Impaired anterior to posterior transit Oral Phase Functional Implications: Prolonged oral  transit   Thin Liquid Thin Liquid: Impaired Presentation: Cup;Straw Oral Phase Impairments: Impaired anterior to posterior transit Oral Phase Functional Implications: Prolonged oral transit Pharyngeal  Phase Impairments: Throat Clearing - Delayed Other Comments: intermittent throat clearing noted - suspect will subside with removal of NG tube    Nectar Thick Nectar Thick Liquid: Not tested   Honey Thick Honey Thick Liquid: Not tested   Puree Puree: Impaired Presentation: Self Fed;Spoon Oral Phase Impairments: Impaired anterior to posterior transit Pharyngeal Phase Impairments: Suspected delayed Swallow Other Comments: sensation of residuals, liquid swallow faciliated clearance   Solid   GO    Solid: Impaired Presentation: Self Fed Oral Phase Impairments: Impaired mastication;Impaired anterior to posterior transit Oral Phase Functional Implications:  (delay transiting) Other Comments: report sensation of pharyngeal residuals, liquid swallow faciliated clearance       [name], [place] [facility] SLP [phone]

## 2014-06-18 NOTE — Progress Notes (Signed)
Pt Residuals checked at 0100 and found to be 510 cc. Tube feeding protocol initiated.Pt is not having any discomfort and has no signs of abdominal distention. At 0200 Tube feeding rechecked and residuals found to be 530cc, rate changed to 25cc/hr (half, per protocol). At 0400 Residual rechecked and found to be 440. [place] Physician notified and ordered to stop tube feeding.

## 2014-06-18 NOTE — Progress Notes (Signed)
16 Days Post-Op  Subjective: Extubated yesterday at 0930, Tube feeding stopped with high residuals last PM.  She is lying in bed talking with no O2 and sats are 96%.  She has good output from her ostomy. Good bowel sounds. NG clamped since last PM with no nausea or vomiting with NG clamped. Objective: Vital signs in last 24 hours: Temp:  [97.6 F (36.4 C)-99.1 F (37.3 C)] 98.2 F (36.8 C) (04/13 0400) Pulse Rate:  [87-118] 104 (04/13 0600) Resp:  [28-32] 28 (04/12 1100) BP: (93-150)/(43-86) 144/66 mmHg (04/13 0600) SpO2:  [95 %-100 %] 97 % (04/13 0600) FiO2 (%):  [30 %] 30 % (04/12 0815) Weight:  [46.6 kg (102 lb 11.8 oz)] 46.6 kg (102 lb 11.8 oz) (04/13 0500) Last BM Date: 06/17/14 1500 from colostomy recorded. Afebrile yesterday, tachycardia, BP stable Na 146, WBC rising CXR 06/16/14:  Persistent pulmonary infiltrates, LEFT pleural effusion and LEFT lower lobe atelectasis versus consolidation Intake/Output from previous day: 04/12 0701 - 04/13 0700 In: 1578.6 [I.V.:234.4; CB/SW:9675.9; IV Piggyback:100] Out: 1638 [Urine:995; Stool:1500] Intake/Output this shift:    General appearance: alert, cooperative and no distress Resp: some rales, but clear on anterior exam GI: soft, sore, wound vac in place.  Ostomy bag is full of liquid stool and gas.  Lab Results:   Recent Labs  06/17/14 0300 06/18/14 0410  WBC 18.5* 20.5*  HGB 8.4* 8.5*  HCT 27.6* 27.4*  PLT 800* 877*    BMET  Recent Labs  06/17/14 0300 06/18/14 0410  NA 143 146*  K 3.7 3.8  CL 108 111  CO2 25 24  GLUCOSE 119* 120*  BUN 38* 43*  CREATININE 0.78 0.66  CALCIUM 8.8 9.4   PT/INR No results for input(s): LABPROT, INR in the last 72 hours.  No results for input(s): AST, ALT, ALKPHOS, BILITOT, PROT, ALBUMIN in the last 168 hours.   Lipase  No results found for: LIPASE   Studies/Results: No results found.  Medications: . antiseptic oral rinse  7 mL Mouth Rinse QID  . aspirin  81 mg Per NG  tube Daily  . chlorhexidine  15 mL Mouth Rinse BID  . clonazePAM  0.5 mg Per Tube QHS  . free water  200 mL Per Tube 4 times per day  . furosemide  20 mg Intravenous Once  . heparin subcutaneous  5,000 Units Subcutaneous 3 times per day  . hydrocortisone sod succinate (SOLU-CORTEF) inj  25 mg Intravenous Daily  . insulin aspart  2-6 Units Subcutaneous 6 times per day  . micafungin [facility]) IV  100 mg Intravenous Daily  . ranitidine  150 mg Per Tube QHS  . sodium chloride  10-40 mL Intracatheter Q12H   . dexmedetomidine Stopped (06/17/14 1200)  . dextrose 5 % and 0.45% NaCl 10 mL/hr at 06/16/14 0845  . feeding supplement (VITAL AF 1.2 CAL) Stopped (06/18/14 0410)   Prior to Admission medications   Medication Sig Start Date End Date Taking? Authorizing Provider  Adalimumab 40 MG/0.8ML PNKT Inject 0.8 mLs into the skin once a week.   Yes Historical Provider, MD  alendronate (FOSAMAX) 70 MG tablet 70 mg. Take with a full glass of water on an empty stomach. No food for 30 minutes after taking Takes on Tuesdays 12/07/12  Yes Lafayette Dragon, MD  aspirin 81 MG tablet Take 81 mg by mouth daily.     Yes Historical Provider, MD  Calcium Carbonate-Vitamin D (CALCIUM-VITAMIN D) 600-125 MG-UNIT TABS Take 1200mg  with vitamin D  daily. 12/07/12  Yes Lafayette Dragon, MD  diazepam (VALIUM) 5 MG tablet TAKE 1 TABLET BY MOUTH EVERY DAY AS NEEDED 10/09/13  Yes Lafayette Dragon, MD  dicyclomine (BENTYL) 20 MG tablet Take 1 tablet by mouth twice daily as needed for crampy abdominal pain 09/04/13  Yes Lafayette Dragon, MD  ergocalciferol (VITAMIN D2) 50000 UNITS capsule Take one po weekly x 12 weeks 05/22/13  Yes Lafayette Dragon, MD  folic acid (FOLVITE) 1 MG tablet Take 1 tablet (1 mg total) by mouth daily. 01/27/14  Yes Lafayette Dragon, MD  HYDROcodone-acetaminophen (NORCO/VICODIN) 5-325 MG per tablet Take 1 tablet by mouth every 6 (six) hours as needed for moderate pain. 05/27/14  Yes Lafayette Dragon, MD  Multiple Vitamin  (MULTIVITAMIN PO) Take 1 tablet by mouth daily.    Yes Historical Provider, MD  predniSONE (DELTASONE) 10 MG tablet TAKE 1 TABLET BY MOUTH EVERY DAY AS DIRECTED 12/25/13  Yes Lafayette Dragon, MD  sulfaSALAzine (AZULFIDINE) 500 MG tablet Take 1 tablet by mouth twice daily x 1 week, then increase to 2 tablets twice daily thereafter. Pharmacy-please d/c rx for lialda Patient taking differently: Take 1,000 mg by mouth 2 (two) times daily. l 01/27/14  Yes Lafayette Dragon, MD  adalimumab (HUMIRA) 40 MG/0.8ML injection Inject 0.8 mLs (40 mg total) into the skin once a week. 05/21/13 05/21/14  Lafayette Dragon, MD  ciprofloxacin (CIPRO) 500 MG tablet Take 1 tablet (500 mg total) by mouth 2 (two) times daily. 06/02/14   Lafayette Dragon, MD     Assessment/Plan Colonic perforation, Crohn's disease EXPLORATORY LAPAROTOMY/PARTIAL COLECTOMY WITH RESECTION OF COLOSTOMY And creation of new colostomy,06/02/2014,  Excell Seltzer, MD  Post op septic shock/peritonitis Post Op ARDS/VDRF - extubated 06/17/14 Hx of ulcerative colitis/on prednisone/Humira/sulfasalazine pre op Multiple abdominal surgeries Hx of anal fistula Hx of CAD/MI Anemia  Tobacco use Heparin/SCD for DVT prophylaxis Off antibiotics since 06/13/14 -  micafungin day 13  Plan:  We will need to talk with CCM.  We can get speech to see and follow recommendations from them.  Concerned with rising WBC, but we can watch for now.  On exam she looks good, abdomen is soft and nothing to indicate GI source for elevated WBC. I will look at wound when wound vac come off for change later today.   LOS: 16 days    [name] 06/18/2014

## 2014-06-18 NOTE — Consult Note (Signed)
WOC wound follow up Wound type: surgical, midline Measurement:m 13.5cm x 3cm x 1.5cm  Wound bed: beefy red, early granulation, subcutaneous tissue Drainage (amount, consistency, odor) minimal in canister Periwound:intact  Dressing procedure/placement/frequency: 1pc Mepitel cut to fit and placed in wound bed. 1pc black foam placed over to fill wound bed, draped and sealed at 155mmHG. Pt tolerated well.   WOC ostomy follow up Stoma type/location: LLQ, end colostomy Stomal assessment/size: 1 3/4" round, budded Peristomal assessment: slight dip in peristomal skin at 3 oclock and stoma has close proximity to the surgical wound Treatment options for stomal/peristomal skin: added 2" barrier ring  Output none in pouch at the time of my assessment, some flatus Ostomy pouching: 2pc.2 1/4" pouch placed   Education provided: pt observed pouch change today, ask some appropriate questions, but at times she asked about the "Gustavus Bryant outside the window" beeping.  We are on the 2nd floor with no view to street or cars.  She says some very appropriate comments but still confused. Reports daughter will assist with care at home.  Enrolled patient in [place] Start Discharge program: No   WOC will continue to assess needs for education with patient and support NPWT dressings as needed.  [place], [place]

## 2014-06-18 NOTE — Evaluation (Signed)
Physical Therapy Evaluation Patient Details Name: [name] MRN: [id number] DOB: 05/20/1955 Today's Date: 06/18/2014   History of Present Illness  59 y/o F, smoker, with hx of MI in the 62s  IBD. Admitted 3/28 with colonic bowel perforation and s/p ex-lap with partial colectomy / resection of colostomy & creation of new colostomy 3/28. On 3/30, she had sudden respiratory decompensation requiring emergent intubation. Clear CXR on admit. Progressed to ARDS 3/31. 59 y/o F, smoker, with hx of MI in the 1980s with s.p stent (details not known), prolonged ICU stay for septic shock from PNA/cellulitis (2009) at [facility] and IBD immunsupprssed on humira, ? Prednisone and sulfasalazine. Admitted 3/28 with colonic bowel perforation and s/p ex-lap with partial colectomy / resection of colostomy & creation of new colostomy 3/28. On 3/30, she had sudden respiratory decompensation requiring emergent intubation.. Progressed to ARDS 3/31.extubated 06/17/14  Clinical Impression  Patient able to stand briefly to pivot to recliner. Patient will benefit from T to address problems listed in note below. HR 117, sats on RA > 92%    Follow Up Recommendations SNF;Supervision/Assistance - 24 hour    Equipment Recommendations  Rolling walker with 5" wheels    Recommendations for Other Services       Precautions / Restrictions Precautions Precautions: Fall Precaution Comments: colostomy, NPWV abdomen, NG suction      Mobility  Bed Mobility Overal bed mobility: Needs Assistance;+ 2 for safety/equipment Bed Mobility: Supine to Sit     Supine to sit: Max assist     General bed mobility comments: assist with trunk to upright, moves legs over edge with min assist.  Transfers Overall transfer level: Needs assistance   Transfers: Sit to/from Stand;Stand Pivot Transfers Sit to Stand: From elevated surface;+2 safety/equipment;+2 physical assistance;Max assist Stand pivot transfers: +2  safety/equipment;+2 physical assistance;Max assist       General transfer comment: bear hug approach, pat stood and took small steps to turn to recliner.  Ambulation/Gait                Stairs            Wheelchair Mobility    Modified Rankin (Stroke Patients Only)       Balance Overall balance assessment: Needs assistance Sitting-balance support: Feet supported;Bilateral upper extremity supported Sitting balance-Leahy Scale: Poor                                       Pertinent Vitals/Pain Pain Assessment: No/denies pain    Home Living Family/patient expects to be discharged to:: Private residence Living Arrangements: Children               Additional Comments: unable to get history    Prior Function                 Hand Dominance        Extremity/Trunk Assessment   Upper Extremity Assessment: Generalized weakness           Lower Extremity Assessment: Generalized weakness      Cervical / Trunk Assessment: Normal  Communication      Cognition Arousal/Alertness: Awake/alert   Overall Cognitive Status: No family/caregiver present to determine baseline cognitive functioning                      General Comments      Exercises  Assessment/Plan    PT Assessment Patient needs continued PT services  PT Diagnosis Difficulty walking;Generalized weakness;Altered mental status   PT Problem List Decreased range of motion;Decreased strength;Decreased activity tolerance;Decreased mobility;Decreased knowledge of precautions;Decreased safety awareness;Decreased knowledge of use of DME  PT Treatment Interventions DME instruction;Gait training;Functional mobility training;Therapeutic activities;Therapeutic exercise;Patient/family education   PT Goals (Current goals can be found in the Care Plan section) Acute Rehab PT Goals PT Goal Formulation: Patient unable to participate in goal setting Time For Goal  Achievement: 07/02/14 Potential to Achieve Goals: Good    Frequency Min 3X/week   Barriers to discharge        Co-evaluation               End of Session   Activity Tolerance: Patient tolerated treatment well Patient left: in chair;with call bell/phone within reach;with chair alarm set;with nursing/sitter in room Nurse Communication: Mobility status         Time: 0850-0910 PT Time Calculation (min) (ACUTE ONLY): 20 min   Charges:   PT Evaluation $Initial PT Evaluation Tier I: 1 Procedure     PT G CodesClaretha Cooper 06/18/2014, 10:01 AM Tresa Endo PT [phone]

## 2014-06-18 NOTE — Evaluation (Addendum)
SLP Cancellation Note  Patient Details Name: REVE CROCKET MRN: [id number] DOB: [birth year]   Cancelled treatment:       Reason Eval/Treat Not Completed:  (order for swallow evaluation received, note pt still has NG in place and was intubated for 13 days (extubated yesterday).  RN reports pt to receive new wound vac in a few minutes.  Will follow up for evaluation.  Please note, pt will benefit from NG removal for evaluation due to likely inadequate epiglottic deflection/airway protection with swallow.)   [name], [facility] SLP [phone]

## 2014-06-18 NOTE — Progress Notes (Signed)
PULMONARY / CRITICAL CARE MEDICINE   Name: [name] MRN: [id number] DOB: April 04, 1955   PCP Nance Pear., NP   ADMISSION DATE:  06/02/2014 CONSULTATION DATE:  06/04/2014   REFERRING MD :  Dr Leighton Ruff  BRIEF SUMMARY:  59 y/o F, smoker, with hx of MI in the 1980s with s.p stent (details not known), prolonged ICU stay for septic shock from PNA/cellulitis (2009) at [facility] and IBD immunsupprssed on humira, ? Prednisone and sulfasalazine. Admitted 3/28 with colonic bowel perforation and s/p ex-lap with partial colectomy / resection of colostomy & creation of new colostomy 3/28.  On 3/30, she had sudden respiratory decompensation requiring emergent intubation.  Clear CXR on admit.  Progressed to ARDS 3/31.     SIGNIFICANT EVENTS: 3/28  Admit 3/30  PCCM consult for abrupt resp decline 3/31  ARDS NET + Start  NIH ROSE Trial (Re-evaluaton Of Systemic Early NMB) - randomized to sedation only Arm  4/03  LUE arterial and venous dopplers >> negative 4/05  Weaned to 0.40, PEEP 8 4/06  Vt liberalized to 8cc/kg 06/13/14: PEEP down to 5, Vt liberalized to 8cc/kg on 4/6 06/14/14: . Awake but not following commands. On Precedex gtt + fent gtt 142mcg and doing PSV on it; this is half the dose of fent earlier. Tolerating TF. Making urine. Febrile last night - part of ongoping fevers. ALine removed 4/11 precedex resumed 4/12 extubated,  4/13 high residuals  SUBJECTIVE/OVERNIGHT/INTERVAL HX AFebrile  Denies pain Good UO   VITAL SIGNS: Temp:  [97.6 F (36.4 C)-99.1 F (37.3 C)] 97.9 F (36.6 C) (04/13 0700) Pulse Rate:  [87-118] 100 (04/13 0800) Resp:  [28-32] 28 (04/12 1100) BP: (93-150)/(43-86) 130/59 mmHg (04/13 0800) SpO2:  [95 %-100 %] 96 % (04/13 0800) Weight:  [46.6 kg (102 lb 11.8 oz)] 46.6 kg (102 lb 11.8 oz) (04/13 0500)   HEMODYNAMICS:     VENTILATOR SETTINGS:     INTAKE / OUTPUT:  Intake/Output Summary (Last 24 hours) at 06/18/14 0851 Last data filed at 06/18/14  0800  Gross per 24 hour  Intake 1508.77 ml  Output   2045 ml  Net -536.23 ml    PHYSICAL EXAMINATION: General:  Critically ill looking female Neuro: interactive, CAM-IC neg for delirium. Moves all 4s. RASS 0 HEENT:  + NGT Cardiovascular:  Tachycardic, Normal heart sounds Lungs:  Decreased bs+ Abdomen:  Soft, dressing intact, vac in place Musculoskeletal:  L arm remains cool, able to doppler pulses Skin:  Intact anteriorly  LABS:  PULMONARY  Recent Labs Lab 06/11/14 1145 06/11/14 1429 06/11/14 1557 06/12/14 1000  PHART 7.493* 7.495* 7.489* 7.481*  PCO2ART 56.0* 55.4* 58.8* 53.1*  PO2ART 70.6* 92.2 89.5 75.2*  HCO3 42.5* 42.3* 44.3* 39.2*  TCO2 38.1 39.1 40.6 36.9  O2SAT 92.5 96.3 95.6 94.0   CBC  Recent Labs Lab 06/16/14 0430 06/17/14 0300 06/18/14 0410  HGB 7.3* 8.4* 8.5*  HCT 23.8* 27.6* 27.4*  WBC 16.9* 18.5* 20.5*  PLT 577* 800* 877*   COAGULATION No results for input(s): INR in the last 168 hours. CARDIAC  Recent Labs Lab 06/16/14 1605 06/16/14 2050 06/17/14 0300  TROPONINI 0.06* 0.09* 0.08*   No results for input(s): PROBNP in the last 168 hours.  CHEMISTRY  Recent Labs Lab 06/12/14 0420  06/14/14 0500 06/15/14 0534 06/16/14 0430 06/17/14 0300 06/18/14 0410  NA 150*  < > 144 145 144 143 146*  K 3.3*  < > 3.9 3.5 3.5 3.7 3.8  CL 101  < >  109 107 106 108 111  CO2 40*  < > 28 30 29 25 24   GLUCOSE 107*  < > 131* 138* 136* 119* 120*  BUN 28*  < > 24* 31* 32* 38* 43*  CREATININE 0.68  < > 0.60 0.68 0.76 0.78 0.66  CALCIUM 8.1*  < > 8.0* 7.9* 8.6 8.8 9.4  MG 1.9  --   --  1.9 2.2 2.4 2.4  PHOS 2.2*  --   --  3.2 3.7 4.4 3.7  < > = values in this interval not displayed. Estimated Creatinine Clearance: 54.4 mL/min (by C-G formula based on Cr of 0.66).  LIVER No results for input(s): AST, ALT, ALKPHOS, BILITOT, PROT, ALBUMIN, INR in the last 168 hours. INFECTIOUS  Recent Labs Lab 06/14/14 1303 06/15/14 0534 06/16/14 0430   PROCALCITON 0.48 0.37 0.25   ENDOCRINE CBG (last 3)   Recent Labs  06/17/14 1933 06/18/14 0015 06/18/14 0740  GLUCAP 115* 116* 116*     IMAGING x48h No results found.  ASSESSMENT / PLAN:  PULMONARY OETT  06/04/2014 >> A:  Acute Respiratory failure due to ARDS - s/p ROSE NIH PETAL Trial - randomized to sedation only arm.  -resolved  P:  Mobilise    CARDIOVASCULAR CVL L IJ 3/30>>4/11 PICC 4/12 >>  A:  CAD, Hx of MI s/p stents in 1980s during age 51s Shock, presumed septic shock due to peritonitis Cool L UE, still has pulses ; doppler negative arterial and venous   - off pressors.  P:   - reassess need daily for diuresis   RENAL A:   Intact but at risk for AKI  - mild low K and mild low mag. On free water for previous hypernatremia. ? Net 11L negative since 4/4/416   P:   Follow BMP Replace electrolytes as indicated Continue low dose free water since 06/12/14  hold lasix for soft bP  GASTROINTESTINAL A:   Crohn's disease, chronic immunosuppresion Perforation s/p surgical resection Peritonitis  P:   Appreciate CCS and GI recs hold TF for high residuals Wound vac being changed MWF  HEMATOLOGIC A:    anemia of critical illness P:  PRBC per ICU guidelines  Only if < 7gm%   INFECTIOUS BCx2 >> negative UC >> negative Tracheal aspirate for bacteria and virus 06/04/14 >> few candida albicans RVP >> negative ................ Blood 06/14/14>>ng BAL 06/14/14 >> ng Urine 06/14/14>>ng  A:   Baseline  - Immunocmpromised patient due to humira + prednisone Current   - admit with bowel perf / peritonitis 06/02/14  - pulmonary event -06/04/14 ARDS - trach aspirate with few candida   - Ongoing fevers 4/102016. Off all antimicrobials as of 06/13/14 but still with fever   WC remains high  P:   resp cx 06/14/2014  >>ng bld 4/9 >> ng Urine 4/9 >> ng Restart micafungn - total 2 weeks needed  Per guidelines (immunsuppressed + peritonitis + fe candida in BAL  06/04/14) .............Marland Kitchen Zosyn 3/29>> (plan 10-14 days) Vanc 3/31 >> 4/6 Micafungin 06/06/14 (candida in BAL, peritonitis) >>06/13/14, 06/15/14 >. 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 Critical Care Time devoted to patient care services described in this note independent of APP time is 31 minutes.     Kara Mead MD. Shade Flood. [place] [facility] Pager [phone] If no response call [phone]    06/18/2014 8:51 AM

## 2014-06-19 LAB — CBC
HCT: 27.4 % — ABNORMAL LOW (ref 36.0–46.0)
Hemoglobin: 8.3 g/dL — ABNORMAL LOW (ref 12.0–15.0)
MCH: 28.6 pg (ref 26.0–34.0)
MCHC: 30.3 g/dL (ref 30.0–36.0)
MCV: 94.5 fL (ref 78.0–100.0)
PLATELETS: 745 10*3/uL — AB (ref 150–400)
RBC: 2.9 MIL/uL — ABNORMAL LOW (ref 3.87–5.11)
RDW: 14.6 % (ref 11.5–15.5)
WBC: 18.1 10*3/uL — ABNORMAL HIGH (ref 4.0–10.5)

## 2014-06-19 LAB — GLUCOSE, CAPILLARY
Glucose-Capillary: 123 mg/dL — ABNORMAL HIGH (ref 70–99)
Glucose-Capillary: 88 mg/dL (ref 70–99)
Glucose-Capillary: 100 mg/dL — ABNORMAL HIGH (ref 70–99)

## 2014-06-19 LAB — BASIC METABOLIC PANEL
ANION GAP: 11 (ref 5–15)
BUN: 33 mg/dL — ABNORMAL HIGH (ref 6–23)
CO2: 21 mmol/L (ref 19–32)
Calcium: 8.9 mg/dL (ref 8.4–10.5)
Chloride: 104 mmol/L (ref 96–112)
Creatinine, Ser: 0.63 mg/dL (ref 0.50–1.10)
Glucose, Bld: 102 mg/dL — ABNORMAL HIGH (ref 70–99)
POTASSIUM: 3.4 mmol/L — AB (ref 3.5–5.1)
Sodium: 136 mmol/L (ref 135–145)

## 2014-06-19 MED ORDER — FENTANYL CITRATE (PF) 100 MCG/2ML IJ SOLN
25.0000 ug | INTRAMUSCULAR | Status: DC | PRN
  Administered 2014-06-19 (×2): 100 ug via INTRAVENOUS
  Administered 2014-06-19: 50 ug via INTRAVENOUS
  Administered 2014-06-20 (×3): 100 ug via INTRAVENOUS
  Filled 2014-06-19: qty 2

## 2014-06-19 MED ORDER — DIAZEPAM 5 MG PO TABS
5.0000 mg | ORAL_TABLET | Freq: Two times a day (BID) | ORAL | Status: DC | PRN
  Administered 2014-06-19 – 2014-06-22 (×4): 5 mg via ORAL
  Filled 2014-06-19 (×4): qty 1

## 2014-06-19 MED ORDER — BOOST / RESOURCE BREEZE PO LIQD
1.0000 | Freq: Three times a day (TID) | ORAL | Status: DC
  Administered 2014-06-21: 1 via ORAL

## 2014-06-19 MED ORDER — POTASSIUM CHLORIDE CRYS ER 20 MEQ PO TBCR
40.0000 meq | EXTENDED_RELEASE_TABLET | Freq: Once | ORAL | Status: AC
  Administered 2014-06-19: 40 meq via ORAL
  Filled 2014-06-19: qty 2

## 2014-06-19 MED ORDER — PREDNISONE 10 MG PO TABS
10.0000 mg | ORAL_TABLET | Freq: Every day | ORAL | Status: DC
  Administered 2014-06-19 – 2014-06-23 (×5): 10 mg via ORAL
  Filled 2014-06-19 (×5): qty 1

## 2014-06-19 NOTE — Progress Notes (Signed)
Speech Language Pathology Treatment: Dysphagia  Patient Details Name: [name] MRN: [id number] DOB: October 02, 1955 Today's Date: 06/19/2014 Time: 8110-3159 SLP Time Calculation (min) (ACUTE ONLY): 25 min  Assessment / Plan / Recommendation Clinical Impression  Pt reports good tolerance of po diet provided.  She does complain of pain that she states is not associated with intake. RN reports she just provided pt with medication.  RN aided SLP to reposition pt in bed.  SLP observed pt consuming solids, puree and liquids.  Swallow was overall timely with clear voice throughout and no evidence of residuals/airway compromise.  Recommend advance diet as tolerated.  All education completed, SLP to sign off.      HPI HPI: pt is a 59 yo female adm to [facility] with colonic perforation s/p exploratory lap with partial colectomy with new colectomy.  PMH + for Crohn's dx, pna/cellulitis (09).  Pt required emergent intubation 3/30-4/12 @ 0930.  Swallow evaluation ordered.    Pertinent Vitals Pain Assessment: No/denies pain  SLP Plan       Recommendations Diet recommendations: Regular;Thin liquid Liquids provided via: Cup;Straw Medication Administration:  (as tolerated) Supervision: Full supervision/cueing for compensatory strategies Compensations: Slow rate;Small sips/bites Postural Changes and/or Swallow Maneuvers: Seated upright 90 degrees;Upright 30-60 min after meal              Follow up Recommendations: None    GO    [name], [facility] SLP [phone]

## 2014-06-19 NOTE — Progress Notes (Signed)
NUTRITION FOLLOW UP  Intervention:   - Diet advancement as medically tolerated - Resource Breeze po TID, each supplement provides 250 kcal and 9 grams of protein -RD will continue to monitor   Nutrition Dx:   Inadequate oral intake related to inability to eat as evidenced by NPO. ongoing  Goal:   Pt to meet >/= 90% of their estimated energy requirements; not met  Monitor:   PO intake, diet advancement, respiratory status, weight, labs, I/O's  Assessment:   55 yoF admitted with colonic bowel perforation. S/p ex lap with partial colectomy 3/28. In ICU with postop respiratory failure on 3/30, requiring emergent intubation. CXR showed bilateral infiltrates suggestive of acute pulmonary edema. WBC elevated and patient is febrile. Patient has a long history of Crohn's disease with at least 2 previous partial colectomies and left-sided ostomy.  - s/p partial colectomy, colostomy 3/28 - Pt extubated 4/12. TF held due to high residuals.  - NG removed and diet upgraded to clear liquids per SLP recommendations. Pt tolerating clears.  - Usual body weight prior to admission ~101-103 lbs.  - RD will continue to monitor  Height: Ht Readings from Last 1 Encounters:  06/14/14 5' (1.524 m)    Weight Status:   Wt Readings from Last 1 Encounters:  06/19/14 98 lb 5.2 oz (44.6 kg)   Body mass index is 19.2 kg/(m^2).  Re-estimated needs (4/8):  Kcal: 1400-1600 Protein: 85 - 95g  Fluid: per MD  Skin: incision on abdomen- wound vac in place  Diet Order: Diet clear liquid Room service appropriate?: Yes; Fluid consistency:: Thin   Intake/Output Summary (Last 24 hours) at 06/19/14 1116 Last data filed at 06/19/14 1031  Gross per 24 hour  Intake    350 ml  Output    357 ml  Net     -7 ml    Last BM: 4/13- colostomy   Labs:   Recent Labs Lab 06/16/14 0430 06/17/14 0300 06/18/14 0410 06/19/14 0502  NA 144 143 146* 136  K 3.5 3.7 3.8 3.4*  CL 106 108 111 104  CO2 _0 BUN 32* 38* 43* 33*  CREATININE 0.76 0.78 0.66 0.63  CALCIUM 8.6 8.8 9.4 8.9  MG 2.2 2.4 2.4  --   PHOS 3.7 4.4 3.7  --   GLUCOSE 136* 119* 120* 102*    CBG (last 3)   Recent Labs  06/18/14 1547 06/19/14 0358 06/19/14 0834  GLUCAP 117* 88 100*    Scheduled Meds: . aspirin  81 mg Per NG tube Daily  . heparin subcutaneous  5,000 Units Subcutaneous 3 times per day  . micafungin [facility]) IV  100 mg Intravenous Daily  . predniSONE  10 mg Oral Daily  . ranitidine  150 mg Per Tube QHS  . sodium chloride  10-40 mL Intracatheter Q12H    Continuous Infusions: . dextrose 5 % and 0.45% NaCl 10 mL/hr at 06/16/14 [place], [place], [place]

## 2014-06-19 NOTE — Progress Notes (Addendum)
PULMONARY / CRITICAL CARE MEDICINE   Name: [name] MRN: [id number] DOB: [birth year]   PCP Nance Pear., NP   ADMISSION DATE:  06/02/2014 CONSULTATION DATE:  06/04/2014   REFERRING MD :  Dr Leighton Ruff  BRIEF SUMMARY:  59 y/o F, smoker, with hx of MI in the 1980s with s.p stent (details not known), prolonged ICU stay for septic shock from PNA/cellulitis (2009) at [facility] and IBD immunsupprssed on humira, ? Prednisone and sulfasalazine. Admitted 3/28 with colonic bowel perforation and s/p ex-lap with partial colectomy / resection of colostomy & creation of new colostomy 3/28.  On 3/30, she had sudden respiratory decompensation requiring emergent intubation.  Clear CXR on admit.  Progressed to ARDS 3/31.     SIGNIFICANT EVENTS: 3/28  Admit 3/30  PCCM consult for abrupt resp decline 3/31  ARDS NET + Start  NIH ROSE Trial (Re-evaluaton Of Systemic Early NMB) - randomized to sedation only Arm  4/03  LUE arterial and venous dopplers >> negative 4/05  Weaned to 0.40, PEEP 8 4/06  Vt liberalized to 8cc/kg 06/13/14: PEEP down to 5, Vt liberalized to 8cc/kg on 4/6 06/14/14: . Awake but not following commands. On Precedex gtt + fent gtt 167mcg and doing PSV on it; this is half the dose of fent earlier. Tolerating TF. Making urine. Febrile last night - part of ongoping fevers. ALine removed 4/11 precedex resumed 4/12 extubated,  4/13 high residuals, cleared swallow  SUBJECTIVE/OVERNIGHT/INTERVAL HX AFebrile  Denies pain   VITAL SIGNS: Temp:  [97.8 F (36.6 C)-99.6 F (37.6 C)] 98.3 F (36.8 C) (04/14 0700) Pulse Rate:  [89-114] 89 (04/14 0800) Resp:  [16-34] 20 (04/14 0800) BP: (114-159)/(41-94) 132/70 mmHg (04/14 0800) SpO2:  [96 %-100 %] 99 % (04/14 0800) Weight:  [98 lb 1.7 oz (44.5 kg)-98 lb 5.2 oz (44.6 kg)] 98 lb 5.2 oz (44.6 kg) (04/14 0455)   HEMODYNAMICS:     VENTILATOR SETTINGS:     INTAKE / OUTPUT:  Intake/Output Summary (Last 24 hours) at 06/19/14  0920 Last data filed at 06/19/14 0500  Gross per 24 hour  Intake    250 ml  Output    557 ml  Net   -307 ml    PHYSICAL EXAMINATION: General:  Critically ill looking female Neuro: interactive, CAM-IC neg for delirium.non focal RASS 0 HEENT:  + NGT Cardiovascular:   Normal heart sounds Lungs:  Decreased bs+ Abdomen:  Soft, dressing intact, vac in place Musculoskeletal:  L arm warm Skin:  Intact anteriorly  LABS:  PULMONARY  Recent Labs Lab 06/12/14 1000  PHART 7.481*  PCO2ART 53.1*  PO2ART 75.2*  HCO3 39.2*  TCO2 36.9  O2SAT 94.0   CBC  Recent Labs Lab 06/17/14 0300 06/18/14 0410 06/19/14 0502  HGB 8.4* 8.5* 8.3*  HCT 27.6* 27.4* 27.4*  WBC 18.5* 20.5* 18.1*  PLT 800* 877* 745*   COAGULATION No results for input(s): INR in the last 168 hours. CARDIAC  Recent Labs Lab 06/16/14 1605 06/16/14 2050 06/17/14 0300  TROPONINI 0.06* 0.09* 0.08*   No results for input(s): PROBNP in the last 168 hours.  CHEMISTRY  Recent Labs Lab 06/15/14 0534 06/16/14 0430 06/17/14 0300 06/18/14 0410 06/19/14 0502  NA 145 144 143 146* 136  K 3.5 3.5 3.7 3.8 3.4*  CL 107 106 108 111 104  CO2 30 29 25 24 21   GLUCOSE 138* 136* 119* 120* 102*  BUN 31* 32* 38* 43* 33*  CREATININE 0.68 0.76 0.78 0.66 0.63  CALCIUM  7.9* 8.6 8.8 9.4 8.9  MG 1.9 2.2 2.4 2.4  --   PHOS 3.2 3.7 4.4 3.7  --    Estimated Creatinine Clearance: 53.3 mL/min (by C-G formula based on Cr of 0.63).  LIVER No results for input(s): AST, ALT, ALKPHOS, BILITOT, PROT, ALBUMIN, INR in the last 168 hours. INFECTIOUS  Recent Labs Lab 06/14/14 1303 06/15/14 0534 06/16/14 0430  PROCALCITON 0.48 0.37 0.25   ENDOCRINE CBG (last 3)   Recent Labs  06/18/14 1547 06/19/14 0358 06/19/14 0834  GLUCAP 117* 88 100*     IMAGING x48h No results found.  ASSESSMENT / PLAN:  PULMONARY OETT  06/04/2014 >> A:  Acute Respiratory failure due to ARDS - s/p ROSE NIH PETAL Trial - randomized to sedation  only arm.  -resolved  P:  Mobilise    CARDIOVASCULAR CVL L IJ 3/30>>4/11 PICC 4/12 >>  A:  CAD, Hx of MI s/p stents in 1980s during age 28s Shock, presumed septic shock due to peritonitis Cool L UE, still has pulses ; doppler negative arterial and venous   - off pressors.  P:   - reassess need daily for diuresis   RENAL A:   Intact but at risk for AKI  - mild low K and mild low mag. On free water for previous hypernatremia. ? Net 11L negative since 4/4/416   P:   Follow BMP Replace electrolytes as indicated   GASTROINTESTINAL A:   Crohn's disease, chronic immunosuppresion Perforation s/p surgical resection Peritonitis  P:   Per CCS and GI recs Advance PO Wound vac being changed MWF  HEMATOLOGIC A:    anemia of critical illness P:  PRBC per ICU guidelines  Only if < 7gm%   INFECTIOUS BCx2 >> negative UC >> negative Tracheal aspirate for bacteria and virus 06/04/14 >> few candida albicans RVP >> negative ................ Blood 06/14/14>>ng BAL 06/14/14 >> ng Urine 06/14/14>>ng  A:   Baseline  - Immunocmpromised patient due to humira + prednisone Current   - admit with bowel perf / peritonitis 06/02/14  - pulmonary event -06/04/14 ARDS - trach aspirate with few candida   - Ongoing fevers 4/102016. Off all antimicrobials as of 06/13/14 but still with fever   WC remains high  P:   resp cx 06/14/2014  >>ng bld 4/9 >> ng Urine 4/9 >> ng Restart micafungn - total 2 weeks needed  Per guidelines (immunsuppressed + peritonitis + fe candida in BAL 06/04/14) .............Marland Kitchen Zosyn 3/29>> (plan 10-14 days) Vanc 3/31 >> 4/6 Micafungin 06/06/14 (candida in BAL, peritonitis) >>06/13/14, 06/15/14 >. 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 [facility]. [place] [facility] Pager [phone] If no response call [phone]    06/19/2014 9:20 AM

## 2014-06-19 NOTE — Progress Notes (Signed)
Patient ID: [name], female   DOB: 1955/07/22, 59 y.o.   MRN: [id number]  [facility]  POD#: 17  Subjective: Patient awake and alert.  Mild abdominal pain.  Tolerating liquids.  Objective: Vital signs in last 24 hours: Temp:  [97.8 F (36.6 C)-99.6 F (37.6 C)] 98.3 F (36.8 C) (04/14 0700) Pulse Rate:  [89-114] 103 (04/14 1000) Resp:  [16-34] 26 (04/14 1000) BP: (103-159)/(41-94) 103/45 mmHg (04/14 1000) SpO2:  [96 %-100 %] 97 % (04/14 1000) Weight:  [44.5 kg (98 lb 1.7 oz)-44.6 kg (98 lb 5.2 oz)] 44.6 kg (98 lb 5.2 oz) (04/14 0455) Last BM Date: 06/19/14  Intake/Output from previous day: 04/13 0701 - 04/14 0700 In: 290 [I.V.:190; IV Piggyback:100] Out: 557 [Urine:557] Intake/Output this shift: Total I/O In: 110 [I.V.:10; IV Piggyback:100] Out: -   Physical Exam: HEENT - sclerae clear, mucous membranes moist Neck - soft Chest - clear bilaterally Cor - RRR Abdomen - soft without distension; few BS present; stoma viable, small liquid in bag; VAC to midline wound Ext - no edema, non-tender Neuro - alert & oriented, no focal deficits  Lab Results:   Recent Labs  06/18/14 0410 06/19/14 0502  WBC 20.5* 18.1*  HGB 8.5* 8.3*  HCT 27.4* 27.4*  PLT 877* 745*   BMET  Recent Labs  06/18/14 0410 06/19/14 0502  NA 146* 136  K 3.8 3.4*  CL 111 104  CO2 24 21  GLUCOSE 120* 102*  BUN 43* 33*  CREATININE 0.66 0.63  CALCIUM 9.4 8.9   PT/INR No results for input(s): LABPROT, INR in the last 72 hours. Comprehensive Metabolic Panel:    Component Value Date/Time   NA 136 06/19/2014 0502   NA 146* 06/18/2014 0410   K 3.4* 06/19/2014 0502   K 3.8 06/18/2014 0410   CL 104 06/19/2014 0502   CL 111 06/18/2014 0410   CO2 21 06/19/2014 0502   CO2 24 06/18/2014 0410   BUN 33* 06/19/2014 0502   BUN 43* 06/18/2014 0410   CREATININE 0.63 06/19/2014 0502   CREATININE 0.66 06/18/2014 0410   GLUCOSE 102* 06/19/2014 0502   GLUCOSE 120* 06/18/2014 0410   CALCIUM 8.9 06/19/2014 0502   CALCIUM 9.4 06/18/2014 0410   AST 62* 06/04/2014 1030   AST 24 06/02/2014 1703   ALT 18 06/04/2014 1030   ALT 13 06/02/2014 1703   ALKPHOS 50 06/04/2014 1030   ALKPHOS 72 06/02/2014 1703   BILITOT 0.7 06/04/2014 1030   BILITOT 0.3 06/02/2014 1703   PROT 5.3* 06/04/2014 1030   PROT 7.2 06/02/2014 1703   ALBUMIN 2.3* 06/04/2014 1030   ALBUMIN 3.8 06/02/2014 1703    Studies/Results: No results found.  Anti-infectives: Anti-infectives    Start     Dose/Rate Route Frequency Ordered Stop   06/15/14 1000  micafungin (MYCAMINE) 100 mg in sodium chloride 0.9 % 100 mL IVPB     100 mg 100 mL/hr over 1 Hours Intravenous Daily 06/15/14 0906 06/22/14 0959   06/06/14 1200  micafungin (MYCAMINE) 100 mg in sodium chloride 0.9 % 100 mL IVPB  Status:  Discontinued     100 mg 100 mL/hr over 1 Hours Intravenous Daily 06/06/14 0944 06/13/14 1025   06/05/14 1000  vancomycin (VANCOCIN) 500 mg in sodium chloride 0.9 % 100 mL IVPB  Status:  Discontinued     500 mg 100 mL/hr over 60 Minutes Intravenous Every 12 hours 06/05/14 0839 06/11/14 1048   06/04/14 1400  vancomycin (VANCOCIN) IVPB 1000 mg/200 mL premix  Status:  Discontinued     1,000 mg 200 mL/hr over 60 Minutes Intravenous Every 24 hours 06/04/14 1313 06/05/14 0839   06/03/14 0200  piperacillin-tazobactam (ZOSYN) IVPB 3.375 g  Status:  Discontinued     3.375 g 12.5 mL/hr over 240 Minutes Intravenous Every 8 hours 06/02/14 2346 06/13/14 1025   06/02/14 1845  piperacillin-tazobactam (ZOSYN) IVPB 3.375 g     3.375 g 12.5 mL/hr over 240 Minutes Intravenous  Once 06/02/14 1833 06/02/14 2257      Assessment & Plans: Status post partial colectomy, colostomy  Clear liquid diet  Wound care Respiratory failure - resolved  Will transfer to floor today.  Earnstine Regal, MD, [facility] Office: [place] 06/19/2014

## 2014-06-19 NOTE — Progress Notes (Signed)
Clinical Social Work Department BRIEF PSYCHOSOCIAL ASSESSMENT 06/19/2014  Patient:  [name], [name]     Account Number:  [id number]     Admit date:  06/02/2014  Clinical Social Worker:  Lacie Scotts  Date/Time:  06/19/2014 02:27 PM  Referred by:  CSW  Date Referred:  06/19/2014 Referred for  SNF Placement   Other Referral:   Interview type:  Patient Other interview type:    PSYCHOSOCIAL DATA Living Status:  FAMILY Admitted from facility:   Level of care:   Primary support name:  Judeth Horn Primary support relationship to patient:  CHILD, ADULT Degree of support available:   supportive    CURRENT CONCERNS Current Concerns  Post-Acute Placement   Other Concerns:    SOCIAL WORK ASSESSMENT / PLAN Pt is a 59 yr old female living at home prior to hospitalization. CSW met with pt to assist with d/c planning. PN reviewed. PT is recommending ST Rehab following hospital d/c. Pt is willing to consider this option and has provided permission for CSW to initiate SNF search. Pt has just transferred to [facility] from ICU/Stepdown. D/C date has not yet been determined. CSW will continue to follow to assist with d/c planning needs.   Assessment/plan status:  Psychosocial Support/Ongoing Assessment of Needs Other assessment/ plan:   Information/referral to community resources:   CSW will return to provide info regarding insurance coverage for SNF and ambulance transport.    PATIENT'S/FAMILY'S RESPONSE TO PLAN OF CARE: Pt's mood is calm. She is tired and feels weak. " I've been through a lot. All this is unexpected. I don't know what happened." Support and reassurance provided. " I don't know what kind of help I will need when I leave here, but I'll think about going to rehab."    Werner Lean LCSW [phone]

## 2014-06-20 LAB — CULTURE, BLOOD (ROUTINE X 2)
CULTURE: NO GROWTH
Culture: NO GROWTH

## 2014-06-20 LAB — BASIC METABOLIC PANEL
Anion gap: 11 (ref 5–15)
BUN: 22 mg/dL (ref 6–23)
CALCIUM: 8.8 mg/dL (ref 8.4–10.5)
CO2: 21 mmol/L (ref 19–32)
Chloride: 96 mmol/L (ref 96–112)
Creatinine, Ser: 0.64 mg/dL (ref 0.50–1.10)
GFR calc Af Amer: >90 mL/min (ref >90)
Glucose, Bld: 106 mg/dL — ABNORMAL HIGH (ref 70–99)
Potassium: 4 mmol/L (ref 3.5–5.1)
SODIUM: 128 mmol/L — AB (ref 135–145)

## 2014-06-20 LAB — CBC
HCT: 28 % — ABNORMAL LOW (ref 36.0–46.0)
Hemoglobin: 8.9 g/dL — ABNORMAL LOW (ref 12.0–15.0)
MCH: 29.3 pg (ref 26.0–34.0)
MCHC: 31.8 g/dL (ref 30.0–36.0)
MCV: 92.1 fL (ref 78.0–100.0)
Platelets: 720 10*3/uL — ABNORMAL HIGH (ref 150–400)
RBC: 3.04 MIL/uL — ABNORMAL LOW (ref 3.87–5.11)
RDW: 14 % (ref 11.5–15.5)
WBC: 16.9 10*3/uL — ABNORMAL HIGH (ref 4.0–10.5)

## 2014-06-20 LAB — GLUCOSE, CAPILLARY
GLUCOSE-CAPILLARY: 122 mg/dL — AB (ref 70–99)
Glucose-Capillary: 92 mg/dL (ref 70–99)

## 2014-06-20 MED ORDER — OXYCODONE-ACETAMINOPHEN 5-325 MG PO TABS
1.0000 | ORAL_TABLET | ORAL | Status: DC | PRN
Start: 2014-06-20 — End: 2014-06-23
  Administered 2014-06-20 (×2): 2 via ORAL
  Administered 2014-06-20 (×3): 1 via ORAL
  Administered 2014-06-21 – 2014-06-23 (×13): 2 via ORAL
  Filled 2014-06-20: qty 1
  Filled 2014-06-20 (×7): qty 2
  Filled 2014-06-20: qty 1
  Filled 2014-06-20 (×4): qty 2
  Filled 2014-06-20: qty 1
  Filled 2014-06-20 (×4): qty 2

## 2014-06-20 MED ORDER — ACETAMINOPHEN 325 MG PO TABS: 325.0000 mg | ORAL_TABLET | Freq: Four times a day (QID) | ORAL | Status: DC | PRN

## 2014-06-20 MED ORDER — FENTANYL CITRATE (PF) 100 MCG/2ML IJ SOLN
25.0000 ug | INTRAMUSCULAR | Status: DC | PRN
Start: 2014-06-20 — End: 2014-06-23

## 2014-06-20 NOTE — Progress Notes (Signed)
Physical Therapy Treatment Patient Details Name: [name] MRN: [id number] DOB: 1955-09-24 Today's Date: 06/20/2014    History of Present Illness 59 y/o F, smoker, with hx of MI in the 79s  IBD. Admitted 3/28 with colonic bowel perforation and s/p ex-lap with partial colectomy / resection of colostomy & creation of new colostomy 3/28. On 3/30, she had sudden respiratory decompensation requiring emergent intubation. Clear CXR on admit. Progressed to ARDS 3/31. 58 y/o F, smoker, with hx of MI in the 1980s with s.p stent (details not known), prolonged ICU stay for septic shock from PNA/cellulitis (2009) at [facility] and IBD immunsupprssed on humira, ? Prednisone and sulfasalazine. Admitted 3/28 with colonic bowel perforation and s/p ex-lap with partial colectomy / resection of colostomy & creation of new colostomy 3/28. On 3/30, she had sudden respiratory decompensation requiring emergent intubation.. Progressed to ARDS 3/31.extubated 06/17/14    PT Comments    Pt was in bed; assisted pt to EOB; OOB to bilateral platform (EVA Walker); pt ambulate 54 ft in unit hall; pt very deconditioned will need short term rehab at SNF;  rolled pt back to room; pt left in chair.   Follow Up Recommendations  SNF;Supervision/Assistance - 24 hour     Equipment Recommendations  Rolling walker with 5" wheels    Recommendations for Other Services       Precautions / Restrictions Precautions Precautions: Fall Precaution Comments: colostomy, NPWV abdomen Restrictions Weight Bearing Restrictions: No    Mobility  Bed Mobility Overal bed mobility: Needs Assistance;+ 2 for safety/equipment Bed Mobility: Supine to Sit     Supine to sit: Mod assist     General bed mobility comments: assist with trunk to upright, moves legs over edge with min assist.  Transfers Overall transfer level: Needs assistance Equipment used: Bilateral platform walker (EVA) Transfers: Sit to/from Stand Sit to Stand: From  elevated surface;+2 physical assistance;+2 safety/equipment;Max assist         General transfer comment: Assist pt. to get elbows on EVA walker.   Ambulation/Gait Ambulation/Gait assistance: +2 physical assistance;+2 safety/equipment Ambulation Distance (Feet): 54 Feet Assistive device: Bilateral platform walker (EVA) Gait Pattern/deviations: Narrow base of support;Scissoring;Shuffle Gait velocity: decreased    General Gait Details: Pt c/o of max weakness, gait pattern appeared flat flooted;shuffle like Very unsteady gait HIGH FALL RISK  Stairs            Wheelchair Mobility    Modified Rankin (Stroke Patients Only)       Balance                                    Cognition Arousal/Alertness: Awake/alert   Overall Cognitive Status: No family/caregiver present to determine baseline cognitive functioning                      Exercises      General Comments        Pertinent Vitals/Pain Pain Assessment: 0-10 Pain Score: 8  Pain Location: Abdomen area  Pain Descriptors / Indicators: Aching    Home Living                      Prior Function            PT Goals (current goals can now be found in the care plan section) Progress towards PT goals: Progressing toward goals    Frequency  Min 3X/week  PT Plan      Co-evaluation             End of Session Equipment Utilized During Treatment: Gait belt Activity Tolerance: Patient tolerated treatment well Patient left: in chair;with call bell/phone within reach     Time: 1345-1415 PT Time Calculation (min) (ACUTE ONLY): 30 min  Charges:  $Gait Training: 8-22 mins $Therapeutic Activity: 8-22 mins                    G Codes:      Duric,[place]re[place] Student PTA 06/20/2014, 4:03 PM   Reviewed and agree with above  Rica Koyanagi  PTA [facility]  Acute  Rehab Pager      [phone]

## 2014-06-20 NOTE — Progress Notes (Signed)
[facility] Progress Note  18 Days Post-Op  Subjective: Pt doing well, tolerating full liquid diet.  No N/V, ambulating some OOB.  Just got a bath.  Having flatus and BM in bag.  Breathing well.  Wound vac changed Wednesday, due to have it changed today.  Objective: Vital signs in last 24 hours: Temp:  [98.2 F (36.8 C)-98.5 F (36.9 C)] 98.3 F (36.8 C) (04/15 0548) Pulse Rate:  [74-103] 74 (04/15 0548) Resp:  [18-26] 18 (04/15 0548) BP: (103-149)/(45-78) 126/74 mmHg (04/15 0548) SpO2:  [97 %-100 %] 100 % (04/15 0548) Weight:  [47.5 kg (104 lb 11.5 oz)] 47.5 kg (104 lb 11.5 oz) (04/15 0548) Last BM Date: 06/19/14  Intake/Output from previous day: 04/14 0701 - 04/15 0700 In: 737.5 [P.O.:540; I.V.:97.5; IV Piggyback:100] Out: 1350 [Urine:1200; Stool:150] Intake/Output this shift:    PE: Gen:  Alert, NAD, pleasant Abd: Soft, minimally tender, ND, +BS, no HSM, wound vac in place, drainage from vac canister is clear yellow   Lab Results:   Recent Labs  06/18/14 0410 06/19/14 0502  WBC 20.5* 18.1*  HGB 8.5* 8.3*  HCT 27.4* 27.4*  PLT 877* 745*   BMET  Recent Labs  06/18/14 0410 06/19/14 0502  NA 146* 136  K 3.8 3.4*  CL 111 104  CO2 24 21  GLUCOSE 120* 102*  BUN 43* 33*  CREATININE 0.66 0.63  CALCIUM 9.4 8.9   PT/INR No results for input(s): LABPROT, INR in the last 72 hours. CMP     Component Value Date/Time   NA 136 06/19/2014 0502   K 3.4* 06/19/2014 0502   CL 104 06/19/2014 0502   CO2 21 06/19/2014 0502   GLUCOSE 102* 06/19/2014 0502   BUN 33* 06/19/2014 0502   CREATININE 0.63 06/19/2014 0502   CALCIUM 8.9 06/19/2014 0502   PROT 5.3* 06/04/2014 1030   ALBUMIN 2.3* 06/04/2014 1030   AST 62* 06/04/2014 1030   ALT 18 06/04/2014 1030   ALKPHOS 50 06/04/2014 1030   BILITOT 0.7 06/04/2014 1030   GFRNONAA >90 06/19/2014 0502   GFRAA >90 06/19/2014 0502   Lipase  No results found for: LIPASE     Studies/Results: No results  found.  Anti-infectives: Anti-infectives    Start     Dose/Rate Route Frequency Ordered Stop   06/15/14 1000  micafungin (MYCAMINE) 100 mg in sodium chloride 0.9 % 100 mL IVPB     100 mg 100 mL/hr over 1 Hours Intravenous Daily 06/15/14 0906 06/22/14 0959   06/06/14 1200  micafungin (MYCAMINE) 100 mg in sodium chloride 0.9 % 100 mL IVPB  Status:  Discontinued     100 mg 100 mL/hr over 1 Hours Intravenous Daily 06/06/14 0944 06/13/14 1025   06/05/14 1000  vancomycin (VANCOCIN) 500 mg in sodium chloride 0.9 % 100 mL IVPB  Status:  Discontinued     500 mg 100 mL/hr over 60 Minutes Intravenous Every 12 hours 06/05/14 0839 06/11/14 1048   06/04/14 1400  vancomycin (VANCOCIN) IVPB 1000 mg/200 mL premix  Status:  Discontinued     1,000 mg 200 mL/hr over 60 Minutes Intravenous Every 24 hours 06/04/14 1313 06/05/14 0839   06/03/14 0200  piperacillin-tazobactam (ZOSYN) IVPB 3.375 g  Status:  Discontinued     3.375 g 12.5 mL/hr over 240 Minutes Intravenous Every 8 hours 06/02/14 2346 06/13/14 1025   06/02/14 1845  piperacillin-tazobactam (ZOSYN) IVPB 3.375 g     3.375 g 12.5 mL/hr over 240 Minutes Intravenous  Once  06/02/14 1833 06/02/14 2257      SIGNIFICANT EVENTS: 3/28 Admit 3/30 PCCM consult for abrupt resp decline 3/31 ARDS NET + Start NIH ROSE Trial (Re-evaluaton Of Systemic Early NMB) - randomized to sedation only Arm  4/03 LUE arterial and venous dopplers >> negative 4/05 Weaned to 0.40, PEEP 8 4/06 Vt liberalized to 8cc/kg 4/07 TF at 60mL/hr, start to advance TF to goal 06/13/14: PEEP down to 5, Vt liberalized to 8cc/kg on 4/6, discontinued antibiotics 06/14/14: . Awake but not following commands. On Precedex gtt + fent gtt 155mcg and doing PSV on it; this is half the dose of fent earlier. Tolerating TF. Making urine. Febrile last night - part of ongoping fevers. ALine removed 4/11 precedex resumed 4/12 extubated 4/13 high residuals, cleared swallow, transferred to floor,  started on clears 4/14 Full liquid diet, soft at dinner  BCx2 >> negative UC >> negative Tracheal aspirate for bacteria and virus 06/04/14 >> few candida albicans RVP >> negative ................ Blood 06/14/14>>ng BAL 06/14/14 >> ng Urine 06/14/14>>ng  Assessment/Plan Colonic perforation after attempted colostomy stricture dilation, Crohn's disease POD #18 s/p Ex Lap/Partial colectomy with colostomy resection and creation of new colostomy, 06/02/2014, Excell Seltzer, MD  -SLP cleared, tolerating fulls today, advance to soft tonight at dinner if tolerating -Colostomy working, [place] following -Monitor WBC - improved today, recheck tomorrow -On exam she looks good, abdomen is soft and nothing to indicate GI source for elevated WBC. -Wound vac ordered as M/W/F -Off antibiotics since 06/13/14  -Micafungin day 14 (total), but Dr. Elsworth Soho recommends 6 more days since restarted on 06/15/14, may need to be changed to oral if discharging home this weekend Post op septic shock/peritonitis Leukocytosis - down to 16.9 today Post Op ARDS/VDRF - resolved, extubated 06/17/14 Hx of ulcerative colitis, immunocompromised on prednisone/Humira/sulfasalazine pre op Multiple abdominal surgeries Hx of anal fistula Hx of CAD/MI Anemia  Tobacco use Heparin/SCD for DVT prophylaxis  Disp:  PT/OT recommending SNF or 24 hr supervision.  May be ready to d/c on this weekend if WBC improved.  She will need HH if going home for ostomy, wound vac can likely be discontinued prior to discharge and switched to WD dressings.    LOS: 18 days    [name], Marcelis Wissner 06/20/2014, 8:01 AM Pager: [phone]

## 2014-06-20 NOTE — Progress Notes (Addendum)
CSW assisting with d/c planning. Met with pt and spoke with daughter regarding ST Rehab placement following possible weekend d/c. SNF bed offers provided. Insurance coverage through [employer] confirmed. Pt / daughter will discuss rehab option. Daughter will call CSW this afternoon with their decision.  Werner Lean LCSW [phone]  14:47  CSW spoke with pt's daughter, Lovena Le [phone] ) this afternoon. Both pt and daughter feel ST Rehab is needed. Lovena Le will review bed offers and chose a facility in [facility]. NSG reports IV antibiotics have been d/c and wound vac will not be needed at discharge. [employer] is open on the weekend and is able to provide authorization prior to d/c once a facility has been chosen. Weekend CSW [phone] ) will assist with d/c planning to SNF if stable for d/c. FL2 has been signed.  Werner Lean LCSW [phone]

## 2014-06-20 NOTE — Progress Notes (Addendum)
PULMONARY / CRITICAL CARE MEDICINE   Name: [name] MRN: [id number] DOB: [birth year]   PCP Nance Pear., NP   ADMISSION DATE:  06/02/2014 CONSULTATION DATE:  06/04/2014   REFERRING MD :  Dr Leighton Ruff  BRIEF SUMMARY:  59 y/o F, smoker, with hx of MI in the 1980s with s.p stent (details not known), prolonged ICU stay for septic shock from PNA/cellulitis (2009) at [facility] and IBD immunsupprssed on humira, ? Prednisone and sulfasalazine. Admitted 3/28 with colonic bowel perforation and s/p ex-lap with partial colectomy / resection of colostomy & creation of new colostomy 3/28.  On 3/30, she had sudden respiratory decompensation requiring emergent intubation.   Progressed to ARDS 3/31.     SIGNIFICANT EVENTS: 3/28  Admit 3/30  PCCM consult for abrupt resp decline 3/31  ARDS NET + Start  NIH ROSE Trial (Re-evaluaton Of Systemic Early NMB) - randomized to sedation only Arm  4/03  LUE arterial and venous dopplers >> negative 4/05  Weaned to 0.40, PEEP 8 4/06  Vt liberalized to 8cc/kg 06/13/14: PEEP down to 5, Vt liberalized to 8cc/kg on 4/6 06/14/14: . Awake but not following commands. On Precedex gtt + fent gtt 126mcg and doing PSV on it; this is half the dose of fent earlier. Tolerating TF. Making urine. Febrile last night - part of ongoping fevers. ALine removed 4/11 precedex resumed 4/12 extubated,  4/13 high residuals, cleared swallow  SUBJECTIVE/OVERNIGHT/INTERVAL HX AFebrile  Denies pain participating in PT   VITAL SIGNS: Temp:  [98.2 F (36.8 C)-98.5 F (36.9 C)] 98.3 F (36.8 C) (04/15 0548) Pulse Rate:  [74-86] 74 (04/15 0548) Resp:  [18] 18 (04/15 0548) BP: (126-149)/(61-78) 126/74 mmHg (04/15 0548) SpO2:  [100 %] 100 % (04/15 0548) Weight:  [47.5 kg (104 lb 11.5 oz)] 47.5 kg (104 lb 11.5 oz) (04/15 0548)   HEMODYNAMICS:     VENTILATOR SETTINGS:     INTAKE / OUTPUT:  Intake/Output Summary (Last 24 hours) at 06/20/14 1130 Last data filed at 06/20/14  1038  Gross per 24 hour  Intake  637.5 ml  Output   1950 ml  Net -1312.5 ml    PHYSICAL EXAMINATION: General:  Critically ill looking female Neuro: interactive, .non focal  HEENT:  No jvd Cardiovascular:   Normal heart sounds Lungs:  Decreased bs+ Abdomen:  Soft, dressing intact, cstomy with gas Musculoskeletal:  L arm warm Skin:  Intact anteriorly  LABS:  PULMONARY No results for input(s): PHART, PCO2ART, PO2ART, HCO3, TCO2, O2SAT in the last 168 hours.  Invalid input(s): PCO2, PO2 CBC  Recent Labs Lab 06/18/14 0410 06/19/14 0502 06/20/14 0920  HGB 8.5* 8.3* 8.9*  HCT 27.4* 27.4* 28.0*  WBC 20.5* 18.1* 16.9*  PLT 877* 745* 720*   COAGULATION No results for input(s): INR in the last 168 hours. CARDIAC  Recent Labs Lab 06/16/14 1605 06/16/14 2050 06/17/14 0300  TROPONINI 0.06* 0.09* 0.08*   No results for input(s): PROBNP in the last 168 hours.  CHEMISTRY  Recent Labs Lab 06/15/14 0534 06/16/14 0430 06/17/14 0300 06/18/14 0410 06/19/14 0502 06/20/14 0920  NA 145 144 143 146* 136 128*  K 3.5 3.5 3.7 3.8 3.4* 4.0  CL 107 106 108 111 104 96  CO2 30 29 25 24 21 21   GLUCOSE 138* 136* 119* 120* 102* 106*  BUN 31* 32* 38* 43* 33* 22  CREATININE 0.68 0.76 0.78 0.66 0.63 0.64  CALCIUM 7.9* 8.6 8.8 9.4 8.9 8.8  MG 1.9 2.2 2.4 2.4  --   --  PHOS 3.2 3.7 4.4 3.7  --   --    Estimated Creatinine Clearance: 54.4 mL/min (by C-G formula based on Cr of 0.64).  LIVER No results for input(s): AST, ALT, ALKPHOS, BILITOT, PROT, ALBUMIN, INR in the last 168 hours. INFECTIOUS  Recent Labs Lab 06/14/14 1303 06/15/14 0534 06/16/14 0430  PROCALCITON 0.48 0.37 0.25   ENDOCRINE CBG (last 3)   Recent Labs  06/18/14 2342 06/19/14 0358 06/19/14 0834  GLUCAP 92 88 100*     IMAGING x48h No results found.  ASSESSMENT / PLAN:  PULMONARY OETT  06/04/2014 >> A:  Acute Respiratory failure due to ARDS - s/p ROSE NIH PETAL Trial - randomized to sedation  only arm.  -resolved  P:  Mobilise    CARDIOVASCULAR CVL L IJ 3/30>>4/11 PICC 4/12 >>  A:  CAD, Hx of MI s/p stents in 1980s during age 30s Shock, presumed septic shock due to peritonitis Cool L UE, still has pulses ; doppler negative arterial and venous   - off pressors.  P:   - reassess need daily for diuresis   RENAL A:   Intact but at risk for AKI  - mild low K and mild low mag. On free water for previous hypernatremia. ? Net 11L negative since 4/4/416 Hyponatremia  P:   Follow BMP on 4/16 Replace electrolytes as indicated   GASTROINTESTINAL A:   Crohn's disease, chronic immunosuppresion Perforation s/p surgical resection Peritonitis  P:   Per CCS and GI recs Advance PO Wound vac   HEMATOLOGIC A:    anemia of critical illness P:  PRBC per ICU guidelines  Only if < 7gm%   INFECTIOUS BCx2 >> negative UC >> negative Tracheal aspirate for bacteria and virus 06/04/14 >> few candida albicans RVP >> negative ................ Blood 06/14/14>>ng BAL 06/14/14 >> ng Urine 06/14/14>>ng  A:   Baseline  - Immunocmpromised patient due to humira + prednisone Current   - admit with bowel perf / peritonitis 06/02/14  - pulmonary event -06/04/14 ARDS - trach aspirate with few candida   - Ongoing fevers 4/102016. Off all antimicrobials as of 06/13/14 but still with fever   WC remains high  P:   resp cx 06/14/2014  >>ng bld 4/9 >> ng Urine 4/9 >> ng Restart micafungn - total 2 weeks needed  Per guidelines (immunsuppressed + peritonitis + fe candida in BAL 06/04/14) .............Marland Kitchen Zosyn 3/29>> (plan 10-14 days) Vanc 3/31 >> 4/6 Micafungin 06/06/14 (candida in BAL, peritonitis) >>06/13/14, 06/15/14 >. 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 steroids  Kara Mead MD. [place].  Pulmonary & Critical care Pager [phone] If no response call [phone]    06/20/2014 11:30 AM

## 2014-06-20 NOTE — Consult Note (Addendum)
WOC wound follow up Vac dressing performed, CCS following for assessment and plan of care.  Wound is stable and shallow in depth; pt could convert to wet to dry dressings upon discharge and discontinue the Vac dressings if CCS agress with this plan of care. Wound type: surgical, midline full thickness Measurement:m 13cm x 1cm x.8 cm  Wound bed: 100% beefy red Drainage (amount, consistency, odor) minimal in canister, no odor Periwound:intact  Dressing procedure/placement/frequency: 1pc Mepitel cut to fit and placed in wound bed. 1pc black foam placed over to fill wound bed, draped and sealed at 148mmHG. Pt tolerated well without discomfort.   WOC ostomy follow up Pt had ostomy prior to admission and is familiar with pouch application and emptying.  Pouch must be changed with each Vac dressing R/T close proximity to abd dressing. Stoma type/location: LLQ, end colostomy Stomal assessment/size: 1 3/4" round, budded Peristomal assessment: slight dip in peristomal skin at 3 oclock and stoma has close proximity to the surgical wound Treatment options for stomal/peristomal skin: added 2" barrier ring to maintain seal.  Output Mod amt semiformed brown stool in pouch. Ostomy pouching: 2pc.2 1/4" pouch placed  Education provided: Pt denies further questions or need for ostomy teaching. She plans to transfer to another facility this weekend.   Please re-consult if further assistance is needed.  Thank-you,  Julien Girt MSN, [facility], [address], [place], [place]

## 2014-06-20 NOTE — Progress Notes (Signed)
Clinical Social Work Department CLINICAL SOCIAL WORK PLACEMENT NOTE 06/20/2014  Patient:  [name], [name]  Account Number:  [id number] Admit date:  06/02/2014  Clinical Social Worker:  Werner Lean, LCSW  Date/time:  06/20/2014 10:39 AM  Clinical Social Work is seeking post-discharge placement for this patient at the following level of care:   SKILLED NURSING   (*CSW will update this form in Epic as items are completed)   06/20/2014  Patient/family provided with [facility] Department of Clinical Social Work's list of facilities offering this level of care within the geographic area requested by the patient (or if unable, by the patient's family).  06/20/2014  Patient/family informed of their freedom to choose among providers that offer the needed level of care, that participate in Medicare, Medicaid or managed care program needed by the patient, have an available bed and are willing to accept the patient.    Patient/family informed of [facility]' ownership interest in [facility], as well as of the fact that they are under no obligation to receive care at this facility.  PASARR submitted to EDS on 06/19/2014 PASARR number received on 06/19/2014  FL2 transmitted to all facilities in geographic area requested by pt/family on  06/19/2014 FL2 transmitted to all facilities within larger geographic area on   Patient informed that his/her managed care company has contracts with or will negotiate with  certain facilities, including the following:     Patient/family informed of bed offers received:  06/20/2014 Patient chooses bed at  Physician recommends and patient chooses bed at    Patient to be transferred to  on   Patient to be transferred to facility by  Patient and family notified of transfer on  Name of family member notified:    The following physician request were entered in Epic:   Additional Comments:

## 2014-06-21 LAB — BASIC METABOLIC PANEL
Anion gap: 10 (ref 5–15)
BUN: 16 mg/dL (ref 6–23)
CALCIUM: 8.7 mg/dL (ref 8.4–10.5)
CO2: 23 mmol/L (ref 19–32)
CREATININE: 0.62 mg/dL (ref 0.50–1.10)
Chloride: 97 mmol/L (ref 96–112)
GLUCOSE: 98 mg/dL (ref 70–99)
Potassium: 3.8 mmol/L (ref 3.5–5.1)
Sodium: 130 mmol/L — ABNORMAL LOW (ref 135–145)

## 2014-06-21 LAB — CBC
HEMATOCRIT: 28.1 % — AB (ref 36.0–46.0)
Hemoglobin: 9.2 g/dL — ABNORMAL LOW (ref 12.0–15.0)
MCH: 30 pg (ref 26.0–34.0)
MCHC: 32.7 g/dL (ref 30.0–36.0)
MCV: 91.5 fL (ref 78.0–100.0)
Platelets: 677 10*3/uL — ABNORMAL HIGH (ref 150–400)
RBC: 3.07 MIL/uL — AB (ref 3.87–5.11)
RDW: 13.8 % (ref 11.5–15.5)
WBC: 13.9 10*3/uL — AB (ref 4.0–10.5)

## 2014-06-21 NOTE — Progress Notes (Signed)
19 Days Post-Op  Subjective: Still having intermittent pain from wound.  Eating.    Objective: Vital signs in last 24 hours: Temp:  [98.2 F (36.8 C)-98.3 F (36.8 C)] 98.3 F (36.8 C) (04/16 0554) Pulse Rate:  [78-88] 84 (04/16 0554) Resp:  [18] 18 (04/16 0554) BP: (103-109)/(64-68) 103/65 mmHg (04/16 0554) SpO2:  [100 %] 100 % (04/16 0554) Weight:  [47.6 kg (104 lb 15 oz)] 47.6 kg (104 lb 15 oz) (04/16 0554) Last BM Date: 06/20/14  Intake/Output from previous day: 04/15 0701 - 04/16 0700 In: 830 [P.O.:820; I.V.:10] Out: 2150 [Urine:2150] Intake/Output this shift:    PE: General- In NAD Abdomen-soft, VAC on   Lab Results:   Recent Labs  06/20/14 0920 06/21/14 0440  WBC 16.9* 13.9*  HGB 8.9* 9.2*  HCT 28.0* 28.1*  PLT 720* 677*   BMET  Recent Labs  06/20/14 0920 06/21/14 0440  NA 128* 130*  K 4.0 3.8  CL 96 97  CO2 21 23  GLUCOSE 106* 98  BUN 22 16  CREATININE 0.64 0.62  CALCIUM 8.8 8.7   PT/INR No results for input(s): LABPROT, INR in the last 72 hours. Comprehensive Metabolic Panel:    Component Value Date/Time   NA 130* 06/21/2014 0440   NA 128* 06/20/2014 0920   K 3.8 06/21/2014 0440   K 4.0 06/20/2014 0920   CL 97 06/21/2014 0440   CL 96 06/20/2014 0920   CO2 23 06/21/2014 0440   CO2 21 06/20/2014 0920   BUN 16 06/21/2014 0440   BUN 22 06/20/2014 0920   CREATININE 0.62 06/21/2014 0440   CREATININE 0.64 06/20/2014 0920   GLUCOSE 98 06/21/2014 0440   GLUCOSE 106* 06/20/2014 0920   CALCIUM 8.7 06/21/2014 0440   CALCIUM 8.8 06/20/2014 0920   AST 62* 06/04/2014 1030   AST 24 06/02/2014 1703   ALT 18 06/04/2014 1030   ALT 13 06/02/2014 1703   ALKPHOS 50 06/04/2014 1030   ALKPHOS 72 06/02/2014 1703   BILITOT 0.7 06/04/2014 1030   BILITOT 0.3 06/02/2014 1703   PROT 5.3* 06/04/2014 1030   PROT 7.2 06/02/2014 1703   ALBUMIN 2.3* 06/04/2014 1030   ALBUMIN 3.8 06/02/2014 1703     Studies/Results: No results  found.  Anti-infectives: Anti-infectives    Start     Dose/Rate Route Frequency Ordered Stop   06/15/14 1000  micafungin (MYCAMINE) 100 mg in sodium chloride 0.9 % 100 mL IVPB  Status:  Discontinued     100 mg 100 mL/hr over 1 Hours Intravenous Daily 06/15/14 0906 06/20/14 1134   06/06/14 1200  micafungin (MYCAMINE) 100 mg in sodium chloride 0.9 % 100 mL IVPB  Status:  Discontinued     100 mg 100 mL/hr over 1 Hours Intravenous Daily 06/06/14 0944 06/13/14 1025   06/05/14 1000  vancomycin (VANCOCIN) 500 mg in sodium chloride 0.9 % 100 mL IVPB  Status:  Discontinued     500 mg 100 mL/hr over 60 Minutes Intravenous Every 12 hours 06/05/14 0839 06/11/14 1048   06/04/14 1400  vancomycin (VANCOCIN) IVPB 1000 mg/200 mL premix  Status:  Discontinued     1,000 mg 200 mL/hr over 60 Minutes Intravenous Every 24 hours 06/04/14 1313 06/05/14 0839   06/03/14 0200  piperacillin-tazobactam (ZOSYN) IVPB 3.375 g  Status:  Discontinued     3.375 g 12.5 mL/hr over 240 Minutes Intravenous Every 8 hours 06/02/14 2346 06/13/14 1025   06/02/14 1845  piperacillin-tazobactam (ZOSYN) IVPB 3.375 g  3.375 g 12.5 mL/hr over 240 Minutes Intravenous  Once 06/02/14 1833 06/02/14 2257      Assessment Colonic perforation after attempted colostomy stricture dilation, Crohn's disease POD #18 s/p Ex Lap/Partial colectomy with colostomy resection and creation of new colostomy, 06/02/2014, Excell Seltzer, MD  -tolerated soft diet -Colostomy working, [place] following -Monitor WBC - continues to trend down -On exam she looks good, abdomen is soft and nothing to indicate GI source for elevated WBC. -Wound vac changes M/W/F -Micafungin day 15 (total), but Dr. Elsworth Soho recommends 5 more days since restarted on 06/15/14, may need to be changed to oral when discharged  Hx of ulcerative colitis, immunocompromised on prednisone/Humira/sulfasalazine pre op Multiple abdominal surgeries  Heparin/SCD for DVT prophylaxis  Disp:  SNF planned.  She is looking into her options.  LOS: 19 days   Plan: SNF.  Check WBC tomorrow.  Continue Micafungin.   Franck Vinal J 06/21/2014

## 2014-06-21 NOTE — Plan of Care (Signed)
Problem: Phase I Progression Outcomes Goal: Pain controlled with appropriate interventions Outcome: Progressing Pt reports long h/o pain related to her Crohn's dz.  Receiving q4 Percocet, but required Valium this a.m. Due to anxiety created from new onset pain after toileting.  Pt said it was effective in treating the anxiety and "taking the edge off of the pain" until next dose of Percocet.  Declined Fentanyl for breakthrough pain.  Problem: Phase II Progression Outcomes Goal: Progress activity as tolerated unless otherwise ordered Outcome: Progressing Pt receiving PT.  Amb to BR. Encouraging pt to amb in hall outside of PT (with assist).  Just given pain meds then says she wants to walk. Goal: Progressing with IS, TCDB Outcome: Progressing Pt requiring encouragement to use I.S., but able to demonstrate correct use. Goal: Dressings dry/intact Outcome: Progressing Wound vac in place.

## 2014-06-21 NOTE — Progress Notes (Signed)
CSW continuing to follow.  CSW followed up with pt at bedside to inquire if pt has made a decision regarding SNF placement.  Pt states that she is narrowing down her options and hopeful to speak with her insurance company to get further clarification about coverage. CSW encouraged pt to also concentrate on choosing a bed at SNF as insurance covers the same at each facility. Pt expressed understanding. Pt had list of bed offers on table in front of her during time of visit.   Pt states that she does not anticipate discharge until Monday at the earliest.  Pt insurance, [employer] requires authorization prior to d/c which can be obtained on day of discharge.  CSW to continue to follow to provide support and assist with pt disposition planning.   Alison Murray, MSW, LCSW Clinical Social Work Weekend coverage [phone]

## 2014-06-22 LAB — CBC
HCT: 27.1 % — ABNORMAL LOW (ref 36.0–46.0)
Hemoglobin: 8.6 g/dL — ABNORMAL LOW (ref 12.0–15.0)
MCH: 29.3 pg (ref 26.0–34.0)
MCHC: 31.7 g/dL (ref 30.0–36.0)
MCV: 92.2 fL (ref 78.0–100.0)
PLATELETS: 616 10*3/uL — AB (ref 150–400)
RBC: 2.94 MIL/uL — ABNORMAL LOW (ref 3.87–5.11)
RDW: 13.9 % (ref 11.5–15.5)
WBC: 12.9 10*3/uL — ABNORMAL HIGH (ref 4.0–10.5)

## 2014-06-22 NOTE — Progress Notes (Signed)
20 Days Post-Op  Subjective: No specific complaints.  Waiting until tomorrow to talk with insurance company about SNF  Objective: Vital signs in last 24 hours: Temp:  [98.5 F (36.9 C)-98.8 F (37.1 C)] 98.5 F (36.9 C) (04/17 0645) Pulse Rate:  [78-80] 78 (04/17 0645) Resp:  [18] 18 (04/17 0645) BP: (112-126)/(64-78) 112/78 mmHg (04/17 0645) SpO2:  [100 %] 100 % (04/17 0645) Weight:  [51.937 kg (114 lb 8 oz)] 51.937 kg (114 lb 8 oz) (04/17 0645) Last BM Date: 06/21/14  Intake/Output from previous day: 04/16 0701 - 04/17 0700 In: 2642.5 [P.O.:2040; I.V.:602.5] Out: 8841 [Urine:1750; Stool:80] Intake/Output this shift: Total I/O In: 3 [I.V.:3] Out: -   PE: General- In NAD Abdomen-soft, VAC on, stoma looks good.  Lab Results:   Recent Labs  06/21/14 0440 06/22/14 0530  WBC 13.9* 12.9*  HGB 9.2* 8.6*  HCT 28.1* 27.1*  PLT 677* 616*   BMET  Recent Labs  06/20/14 0920 06/21/14 0440  NA 128* 130*  K 4.0 3.8  CL 96 97  CO2 21 23  GLUCOSE 106* 98  BUN 22 16  CREATININE 0.64 0.62  CALCIUM 8.8 8.7   PT/INR No results for input(s): LABPROT, INR in the last 72 hours. Comprehensive Metabolic Panel:    Component Value Date/Time   NA 130* 06/21/2014 0440   NA 128* 06/20/2014 0920   K 3.8 06/21/2014 0440   K 4.0 06/20/2014 0920   CL 97 06/21/2014 0440   CL 96 06/20/2014 0920   CO2 23 06/21/2014 0440   CO2 21 06/20/2014 0920   BUN 16 06/21/2014 0440   BUN 22 06/20/2014 0920   CREATININE 0.62 06/21/2014 0440   CREATININE 0.64 06/20/2014 0920   GLUCOSE 98 06/21/2014 0440   GLUCOSE 106* 06/20/2014 0920   CALCIUM 8.7 06/21/2014 0440   CALCIUM 8.8 06/20/2014 0920   AST 62* 06/04/2014 1030   AST 24 06/02/2014 1703   ALT 18 06/04/2014 1030   ALT 13 06/02/2014 1703   ALKPHOS 50 06/04/2014 1030   ALKPHOS 72 06/02/2014 1703   BILITOT 0.7 06/04/2014 1030   BILITOT 0.3 06/02/2014 1703   PROT 5.3* 06/04/2014 1030   PROT 7.2 06/02/2014 1703   ALBUMIN 2.3*  06/04/2014 1030   ALBUMIN 3.8 06/02/2014 1703     Studies/Results: No results found.  Anti-infectives: Anti-infectives    Start     Dose/Rate Route Frequency Ordered Stop   06/15/14 1000  micafungin (MYCAMINE) 100 mg in sodium chloride 0.9 % 100 mL IVPB  Status:  Discontinued     100 mg 100 mL/hr over 1 Hours Intravenous Daily 06/15/14 0906 06/20/14 1134   06/06/14 1200  micafungin (MYCAMINE) 100 mg in sodium chloride 0.9 % 100 mL IVPB  Status:  Discontinued     100 mg 100 mL/hr over 1 Hours Intravenous Daily 06/06/14 0944 06/13/14 1025   06/05/14 1000  vancomycin (VANCOCIN) 500 mg in sodium chloride 0.9 % 100 mL IVPB  Status:  Discontinued     500 mg 100 mL/hr over 60 Minutes Intravenous Every 12 hours 06/05/14 0839 06/11/14 1048   06/04/14 1400  vancomycin (VANCOCIN) IVPB 1000 mg/200 mL premix  Status:  Discontinued     1,000 mg 200 mL/hr over 60 Minutes Intravenous Every 24 hours 06/04/14 1313 06/05/14 0839   06/03/14 0200  piperacillin-tazobactam (ZOSYN) IVPB 3.375 g  Status:  Discontinued     3.375 g 12.5 mL/hr over 240 Minutes Intravenous Every 8 hours 06/02/14 2346 06/13/14 1025  06/02/14 1845  piperacillin-tazobactam (ZOSYN) IVPB 3.375 g     3.375 g 12.5 mL/hr over 240 Minutes Intravenous  Once 06/02/14 1833 06/02/14 2257      Assessment Colonic perforation after attempted colostomy stricture dilation, Crohn's disease POD #19 s/p Ex Lap/Partial colectomy with colostomy resection and creation of new colostomy, 06/02/2014, Excell Seltzer, MD  -tolerating diet -Colostomy working, [place] following -Monitor WBC - continues to trend down slowly -On exam she looks good, abdomen is soft and nothing to indicate GI source for elevated WBC. -Wound vac changes M/W/F -Micafungin day 16 (total), but Dr. Elsworth Soho recommends 4 more days since restarted on 06/15/14, may need to be changed to oral when discharged  Hx of ulcerative colitis, immunocompromised on  prednisone/Humira/sulfasalazine pre op Multiple abdominal surgeries  Heparin/SCD for DVT prophylaxis  Disp: SNF planned.  She is looking into her options.  LOS: 20 days   Plan: SNF.  Continue Micafungin.   [name] J 06/22/2014

## 2014-06-23 ENCOUNTER — Telehealth: Payer: Self-pay | Admitting: Family

## 2014-06-23 MED ORDER — HYDROCODONE-ACETAMINOPHEN 5-325 MG PO TABS: 1.0000 | ORAL_TABLET | Freq: Four times a day (QID) | ORAL | Status: DC | PRN

## 2014-06-23 MED ORDER — DIAZEPAM 5 MG PO TABS: 5.0000 mg | ORAL_TABLET | Freq: Two times a day (BID) | ORAL | Status: DC | PRN

## 2014-06-23 MED ORDER — ACETAMINOPHEN 325 MG PO TABS: 650.0000 mg | ORAL_TABLET | Freq: Four times a day (QID) | ORAL | Status: AC | PRN

## 2014-06-23 MED ORDER — FAMOTIDINE 20 MG PO TABS: ORAL_TABLET | ORAL | Status: AC

## 2014-06-23 MED ORDER — FAMOTIDINE 20 MG PO TABS
20.0000 mg | ORAL_TABLET | Freq: Every day | ORAL | Status: DC
  Filled 2014-06-23: qty 1

## 2014-06-23 NOTE — Progress Notes (Signed)
21 Days Post-Op  Subjective: She is trying to get SNF placement, she lives alone normally.  She is doing well only complaint is decaf coffee.  Objective: Vital signs in last 24 hours: Temp:  [97.8 F (36.6 C)-98 F (36.7 C)] 97.8 F (36.6 C) (04/18 0509) Pulse Rate:  [84-107] 84 (04/18 0509) Resp:  [16-18] 16 (04/18 0509) BP: (104-127)/(58-79) 127/67 mmHg (04/18 0509) SpO2:  [99 %-100 %] 99 % (04/18 0509) Weight:  [50.894 kg (112 lb 3.2 oz)] 50.894 kg (112 lb 3.2 oz) (04/18 0509) Last BM Date: 06/22/14 Nothing po recorded  Soft diet 50 ml from the drain 150 from the NG Afebrile, VSS WBC still up but improving Anemia but stable  Intake/Output from previous day: 04/17 0701 - 04/18 0700 In: 243 [I.V.:243] Out: 1650 [Urine:1450; Drains:50; Stool:150] Intake/Output this shift: Total I/O In: 10 [I.V.:10] Out: 75 [Urine:75]  General appearance: alert, cooperative and no distress Resp: clear to auscultation bilaterally GI: soft, tolerating soft diet, wound vac coming off and I wll see shortly. Ostomy working well.  Lab Results:   Recent Labs  06/21/14 0440 06/22/14 0530  WBC 13.9* 12.9*  HGB 9.2* 8.6*  HCT 28.1* 27.1*  PLT 677* 616*    BMET  Recent Labs  06/21/14 0440  NA 130*  K 3.8  CL 97  CO2 23  GLUCOSE 98  BUN 16  CREATININE 0.62  CALCIUM 8.7   PT/INR No results for input(s): LABPROT, INR in the last 72 hours.  No results for input(s): AST, ALT, ALKPHOS, BILITOT, PROT, ALBUMIN in the last 168 hours.   Lipase  No results found for: LIPASE   Studies/Results: No results found.  Medications: . aspirin  81 mg Per NG tube Daily  . feeding supplement (RESOURCE BREEZE)  1 Container Oral TID BM  . heparin subcutaneous  5,000 Units Subcutaneous 3 times per day  . predniSONE  10 mg Oral Daily  . ranitidine  150 mg Per Tube QHS  . sodium chloride  10-40 mL Intracatheter Q12H    Assessment/Plan Colonic perforation, Crohn's disease EXPLORATORY  LAPAROTOMY/PARTIAL COLECTOMY WITH RESECTION OF COLOSTOMY And creation of new colostomy,06/02/2014, Excell Seltzer, MD  Post op septic shock/peritonitis Post Op ARDS/VDRF - extubated 06/17/14 Hx of ulcerative colitis/on prednisone/Humira/sulfasalazine pre op Multiple abdominal surgeries Hx of anal fistula Hx of CAD/MI Anemia  Tobacco use Heparin/SCD for DVT prophylaxis Off antibiotics since 06/13/14 - micafungin discontinued after dose 06/20/14   Plan:  She is a disposition issue now.  She is otherwise ready for discharge.   Issues to review WBC is still up off antibiotics and and antifungal.  Still on some steroids, can we d/c these?  The is a variance between notes and antifungal use.  I will discuss and get her out later today or tomorrow pending my discussion with Dr. Redmond Pulling. Open abdominal wound looks good, one area that is tracking and I have ask them to pack tail of gauze into this area.  LOS: 21 days    [name] 06/23/2014

## 2014-06-23 NOTE — Telephone Encounter (Signed)
Please contact pt and arrange hospital follow up.

## 2014-06-23 NOTE — Care Management Note (Signed)
    Page 1 of 2   06/23/2014     9:51:46 AM CARE MANAGEMENT NOTE 06/23/2014  Patient:  [name], [name]   Account Number:  [id number]  Date Initiated:  06/06/2014  Documentation initiated by:  DAVIS,RHONDA  Subjective/Objective Assessment:   had outpt colonoscopy sustained perforated bowel and was taken to or for colostomy has hx of Crohn disease     Action/Plan:   Likely need SNF for rehab   Anticipated DC Date:  06/23/2014   Anticipated DC Plan:  SKILLED NURSING FACILITY  In-house referral  Clinical Social Worker      DC Planning Services  CM consult      [facility] Choice  NA   Choice offered to / List presented to:  NA           Status of service:  Completed, signed off Medicare Important Message given?  YES (If response is "NO", the following Medicare IM given date fields will be blank) Date Medicare IM given:  06/23/2014 Medicare IM given by:  [facility] Date Additional Medicare IM given:   Additional Medicare IM given by:    Discharge Disposition:  [place]  Per UR Regulation:  Reviewed for med. necessity/level of care/duration of stay  If discussed at [place] of Stay Meetings, dates discussed:   06/10/2014  06/12/2014    Comments:  June 16, 2014/Rhonda L. Rosana Hoes, RN, BSN, CCM. Case Management [place] [phone] No discharge needs present of time of review. remains on vent and ROSE study.  Unresponsive to most stimuli will open eyes but not purposeful.  June 12, 2014/Rhonda L. Rosana Hoes, RN, BSN, CCM. Case Management [place] [phone] No discharge needs present of time of review.   [id number] Rosana Hoes RN, BSN, CCN: [phone] Case management. Chart reviewed for discharge planning and present needs. Discharge needs: none present at time of review Remains on full vent support/ Iv nimbex per the ARDS Protocol study.  [id number] Rosana Hoes RN, BSN, CCN: [phone] Case  management. Chart reviewed for discharge planning and present needs. Discharge needs: none present at time of review.   June 03, 2014/Rhonda L. Rosana Hoes, RN, BSN, CCM. Case Management [place] [phone] No discharge needs present of time of review.

## 2014-06-23 NOTE — Progress Notes (Signed)
Clinical Social Work Department CLINICAL SOCIAL WORK PLACEMENT NOTE 06/23/2014  Patient:  [name], [name]  Account Number:  [id number] Admit date:  06/02/2014  Clinical Social Worker:  Werner Lean, LCSW  Date/time:  06/20/2014 10:39 AM  Clinical Social Work is seeking post-discharge placement for this patient at the following level of care:   SKILLED NURSING   (*CSW will update this form in Epic as items are completed)   06/20/2014  Patient/family provided with [place] Department of Clinical Social Work's list of facilities offering this level of care within the geographic area requested by the patient (or if unable, by the patient's family).  06/20/2014  Patient/family informed of their freedom to choose among providers that offer the needed level of care, that participate in Medicare, Medicaid or managed care program needed by the patient, have an available bed and are willing to accept the patient.    Patient/family informed of [facility]' ownership interest in [facility], as well as of the fact that they are under no obligation to receive care at this facility.  PASARR submitted to EDS on 06/19/2014 PASARR number received on 06/19/2014  FL2 transmitted to all facilities in geographic area requested by pt/family on  06/19/2014 FL2 transmitted to all facilities within larger geographic area on   Patient informed that his/her managed care company has contracts with or will negotiate with  certain facilities, including the following:     Patient/family informed of bed offers received:  06/20/2014 Patient chooses bed at [facility] Physician recommends and patient chooses bed at    Patient to be transferred to [facility] on  06/23/2014 Patient to be transferred to facility by [facility] Patient and family notified of transfer on 06/23/2014 Name of family member notified:  DAUGHTER  The following physician request were entered in Epic:   Additional  Comments: Pt / daughter are in agreement with d/c to SNF today. Pt is requesting transport by car. [employer] has authorized SNF placement. [facility] has reviewed d/c summary, scripts, avs. Scripts included in d/c packet. Packet provided to pt's daughter prior to d/c.  Werner Lean LCSW [phone]

## 2014-06-23 NOTE — Telephone Encounter (Signed)
FYI - Patient states she is on her way to [facility] and will be there for at least a week.  She would like to schedule appointment when she is discharged from Rehab.

## 2014-06-23 NOTE — Progress Notes (Signed)
Pt discharging to [facility] SNF.  Nurse called report to [place] at [facility].  Pt's daughter given discharge instruction packet for SNF.  No concerns at time of discharge.

## 2014-06-23 NOTE — Progress Notes (Signed)
CSW assisting with d/c planning. Spoke with PA this am. Pt has had SNF bed offers since Friday. Spoke with pt / daughter this am to encourage them to chose a facility. Daughter will contact pt and have a decision this afternoon. Insurance contacted and updated. CSW will call insurance back for authorization once pt chooses SNF. CSW will assist with d/c planning to SNF once medically stable.  Werner Lean LCSW [phone] .

## 2014-06-23 NOTE — Discharge Instructions (Signed)
[facility]      [facility] °[phone] ° °OPEN ABDOMINAL SURGERY: POST OP INSTRUCTIONS ° °Always review your discharge instruction sheet given to you by the facility where your surgery was performed. ° °IF YOU HAVE DISABILITY OR FAMILY LEAVE FORMS, YOU MUST BRING THEM TO THE OFFICE FOR PROCESSING.  PLEASE DO NOT GIVE THEM TO YOUR DOCTOR. ° °1. A prescription for pain medication may be given to you upon discharge.  Take your pain medication as prescribed, if needed.  If narcotic pain medicine is not needed, then you may take acetaminophen (Tylenol) or ibuprofen (Advil) as needed. °2. Take your usually prescribed medications unless otherwise directed. °3. If you need a refill on your pain medication, please contact your pharmacy. They will contact our office to request authorization.  Prescriptions will not be filled after 5pm or on week-ends. °4. You should follow a light diet the first few days after arrival home, such as soup and crackers, pudding, etc.unless your doctor has advised otherwise. A high-fiber, low fat diet can be resumed as tolerated.   Be sure to include lots of fluids daily. Most patients will experience some swelling and bruising on the chest and neck area.  Ice packs will help.  Swelling and bruising can take several days to resolve °5. Most patients will experience some swelling and bruising in the area of the incision. Ice pack will help. Swelling and bruising can take several days to resolve..  °6. It is common to experience some constipation if taking pain medication after surgery.  Increasing fluid intake and taking a stool softener will usually help or prevent this problem from occurring.  A mild laxative (Milk of Magnesia or Miralax) should be taken according to package directions if there are no bowel movements after 48 hours. °7.  You may have steri-strips (small skin tapes) in place directly over the incision.  These strips should be left on the skin for 7-10 days.  If your  surgeon used skin glue on the incision, you may shower in 24 hours.  The glue will flake off over the next 2-3 weeks.  Any sutures or staples will be removed at the office during your follow-up visit. You may find that a light gauze bandage over your incision may keep your staples from being rubbed or pulled. You may shower and replace the bandage daily. °8. ACTIVITIES:  You may resume regular (light) daily activities beginning the next day--such as daily self-care, walking, climbing stairs--gradually increasing activities as tolerated.  You may have sexual intercourse when it is comfortable.  Refrain from any heavy lifting or straining until approved by your doctor. °a. You may drive when you no longer are taking prescription pain medication, you can comfortably wear a seatbelt, and you can safely maneuver your car and apply brakes °b. Return to Work: ___________________________________ °9. You should see your doctor in the office for a follow-up appointment approximately two weeks after your surgery.  Make sure that you call for this appointment within a day or two after you arrive home to insure a convenient appointment time. °OTHER INSTRUCTIONS:  °_____________________________________________________________ °_____________________________________________________________ ° °WHEN TO CALL YOUR DOCTOR: °1. Fever over 101.0 °2. Inability to urinate °3. Nausea and/or vomiting °4. Extreme swelling or bruising °5. Continued bleeding from incision. °6. Increased pain, redness, or drainage from the incision. °7. Difficulty swallowing or breathing °8. Muscle cramping or spasms. °9. Numbness or tingling in hands or feet or around lips. ° °The clinic staff is available to   answer your questions during regular business hours.  Please dont hesitate to call and ask to speak to one of the nurses if you have concerns.  For further questions, please visit [email or web address] Colostomy Surgery Care After Refer to  this sheet in the next few weeks. These instructions provide you with information on caring for yourself after your procedure. Your caregiver may also give you more specific instructions. Your treatment has been planned according to current medical practices, but problems sometimes occur. Call your caregiver if you have any problems or questions after your procedure. HOME CARE INSTRUCTIONS  Rest. Give yourself time to adjust to having the external pouch. Give your surgical cuts (incisions) time to heal.  Avoid strenuous activity, heavy lifting, and abdominal exercises for 3 weeks. After that, you may return to your normal activities.  Take pain medicine and other medicines as directed. Your caregiver may recommend certain medicines for the relief of constipation or diarrhea.  You may shower with or without the pouch.  Wear any clothing that is comfortable.  Follow your caregiver's instructions to protect the skin around the stoma.  Change your pouch every 2 to 4 days, or as directed.  Follow your caregiver's dietary instructions. Your caregiver will help you understand which foods may cause blockage, increase bowel movements, slow bowel movements, cause skin irritation, or cause gas.  You may gradually resume most activities, including sexual activity, as directed. Ask your caregiver about becoming pregnant and about using birth control. Medicines may not be absorbed normally after your procedure.  Always discuss your medicines with your caregiver before taking them.  You may travel. Be sure to pack plenty of colostomy supplies.  If you smoke, quit. Smoking slows the healing process. SEEK MEDICAL CARE IF:  You are having trouble caring for your stoma or using the ostomy supplies (changing the pouch).  You feel sick to your stomach (nauseous).  You throw up (vomit).  You notice bleeding, skin irritation, drainage, bulging, redness of the wounds, or pain around the anus or  stoma.  You notice a change in the size or appearance of the stoma.  You have abdominal pain, bloating, pressure, or cramping.  Your stools do not become firmer.  Your stool frequency is more or less often than expected.  You have frequent diarrhea or watery stool.  You are not making enough urine. This may be a sign of dehydration.  You experience sexual dysfunction.  You experience shortness of breath, fatigue, thirst, dry mouth, or unusual sensations in the limbs.  You have other new symptoms.  You have questions or concerns. SEEK IMMEDIATE MEDICAL CARE IF:  Your abdominal pain does not go away or it becomes severe.  You have a fever.  You keep vomiting.  Your stool is not draining through the stoma.  You have an irregular heartbeat or chest pain. MAKE SURE YOU:  Understand these instructions.  Will watch your condition.  Will get help right away if you are not doing well or get worse. Document Released: 07/14/2010 Document Revised: 05/16/2011 Document Reviewed: 07/14/2010 [facility] Patient Information 2015 [place], [place]. This information is not intended to replace advice given to you by your health care provider. Make sure you discuss any questions you have with your health care provider.  Acute Respiratory Distress Syndrome  Acute respiratory distress syndrome (ARDS) is a serious, life-threatening lung condition that can cause breathing failure. It occurs in people who are critically ill or in people who have had a serious injury.  CAUSES  ARDS occurs when small blood vessels in the lungs leak fluid into the air sacs (alveoli) of the lungs. The fluid causes the lungs to become "stiff" and decreases their ability to inflate. The fluid also prevents oxygen from being absorbed into the bloodstream. When the bloodstream does not have enough oxygen, the body's vital organs do not get enough oxygen to function properly. ARDS can occur in the following  conditions:  Sepsis. This is a serious bloodstream infection.  Serious injury (trauma) to the head or chest.  Pneumonia.  After major surgery, such as a lung transplant.  Drug overdose.  Breathing (inhalation) of harmful chemicals. SYMPTOMS  ARDS comes on quickly (rapid onset) and can occur within 24 to 48 hours of an infection, illness, surgery, or injury. Symptoms include:  Shortness of breath or difficulty breathing.  Cyanosis. This is a bluish color to the skin or nail beds (due to low oxygen levels in the blood).  Fast or irregular heart rate.  Low blood pressure (hypotension).  Organ failure. DIAGNOSIS  There is not a specific test to diagnose ARDS. It is usually diagnosed when other diseases and conditions that cause similar symptoms have been ruled out. When a person is thought to have ARDS, the following tests may be performed:  A chest X-ray or computed tomography (CT) scan to look at the lungs.  Arterial blood gas (ABG) analysis. This test looks at the oxygen level in the blood.  Blood tests to rule out infection.  Sputum culture to rule out a lung infection.  Bronchoscopy. TREATMENT  ARDS is a critical condition. People who develop ARDS need to be in a hospital intensive care unit (ICU). Treatment of ARDS includes:  Providing oxygenation. This is a main treatment goal of ARDS. A breathing machine (ventilator) is often used to help a person breathe and to provide oxygen. When on a breathing machine, medicine is given to keep patients asleep (sedated).  Treatment of the underlying cause of ARDS (infection, illness, or trauma).  Supportive treatment such as:  Intravenous (IV) fluids.  Liquid nutrition that goes through an IV or feeding tube.  Blood pressure medicine to support low blood pressure.  Antibiotic medicine to help fight infection.  Steroid medicine to help decrease swelling (inflammation) in the lungs.  Diuretic medicine to get rid of extra  fluid in the body. HOME CARE INSTRUCTIONS  After recovering from ARDS, you may have weakness, shortness of breath, or memory problems. You may also suffer from depression or from complications of the illness that caused ARDS. You can do several things to help your recovery:  Do not smoke.  If you drink alcohol, limit the amount of alcohol you drink to 1 or 2 drinks a day.  Be sure you get a yearly flu (influenza) shot. You should get a pneumonia vaccine once every 5 years.  Ask your caregiver about lung rehabilitation programs.  Ask your caregiver about local support groups for people with breathing problems.  Ask friends and family to help you if daily activities make you tired. SEEK MEDICAL CARE IF:   You become short of breath with activity or while at rest.  You develop a cough that does not go away. SEEK IMMEDIATE MEDICAL CARE IF:   You have sudden shortness of breath with or without chest pain.  You have chest pain that does not go away.  You develop swelling or pain in one of your legs.  You have trauma to your chest or any  other part of your body.  You have a fever.  You overdose or have a reaction to your medicine. MAKE SURE YOU:   Understand these instructions.  Will watch your condition.  Will get help right away if you are not doing well or get worse. Document Released: 02/21/2005 Document Revised: 07/08/2013 Document Reviewed: 11/10/2010 [facility] Patient Information 2015 [place], [place]. This information is not intended to replace advice given to you by your health care provider. Make sure you discuss any questions you have with your health care provider.

## 2014-06-23 NOTE — Discharge Summary (Signed)
Physician Discharge Summary  Patient ID: [name] MRN: [id number] DOB/AGE: 08/02/1955 59 y.o.  Admit date: 06/02/2014 Discharge date: 06/23/2014  Admission Diagnoses:  Colon perforation; s/p colonoscopy Crohn's disease Prior history of partial colectomy x 2  Discharge Diagnoses:  Colonic perforation, Crohn's disease Post op septic shock/peritonitis Post Op ARDS/VDRF - extubated 06/17/14 Hx of ulcerative colitis/on prednisone/Humira/sulfasalazine pre op Multiple abdominal surgeries Hx of anal fistula Hx of CAD/MI Post op demand ischemia (EF  55%, on ECHO 06/04/58) Anemia  HX ofTobacco use Heparin/SCD for DVT prophylaxis Off antibiotics since 06/13/14 - Micafungin started 06/06/14, discontinued after dose 06/20/14  Principal Problem:   Perforation of colon Active Problems:   Acute pulmonary edema   Acute respiratory failure with hypoxemia   ARDS (adult respiratory distress syndrome)   Septic shock   Acute respiratory failure with hypoxia   Elevated troponin   Protein-calorie malnutrition, severe   PROCEDURES: EXPLORATORY LAPAROTOMY/PARTIAL COLECTOMY WITH RESECTION OF COLOSTOMY And creation of new colostomy,06/02/2014, Excell Seltzer, MD    Hospital Course: Patient has a long history of Crohn's disease with at least 2 previous partial colectomies and left-sided ostomy. She underwent colonoscopy today with findings of a stricture very near the ostomy. There was concern for a tear of the colon at the time of ostomy. The patient has had steadily worsening diffuse abdominal pain since the procedure. Evaluation in the emergency department includes chest x-ray and abdominal x-ray showing a significant amount of free air. She was seen in the ED and taken to the OR by Dr. Excell Seltzer, as noted above. She initially was doing well, the second post op day she developed respiratory distress, pulmonary edema, with bilateral infiltrates and required reintubation .  CCM took over her  respiratory care at this point.  She was kept on antibiotics, troponin's were mildy elevated consistent with demand ischemia.   She was placed on heparin with rise in troponin's . She was also on Neo for BP at this point.  Echo showed LV function to be normal.  She developed ARDS, along with shock from sepsis.  She was ultimately extubated 06/17/14.  Her ostomy function has returned and her diet was advanced after swallowing study.  Her wounds are healing nicely, her ostomy is working she is on a low fiber diet for the next 10 days and then she can go to a regular diet.  By the AM of 06/23/14 she is on a soft diet, wound vac removed and one site demonstrated with some tracking.  We will go to wet to dry dressings, and she will be ready to go to SNF later today.    Condition on d/c:  Improved      Disposition: 06-Home-Health Care Svc     Medication List    STOP taking these medications        adalimumab 40 MG/0.8ML injection  Commonly known as:  HUMIRA     Adalimumab 40 MG/0.8ML Pnkt     ciprofloxacin 500 MG tablet  Commonly known as:  CIPRO     sulfaSALAzine 500 MG tablet  Commonly known as:  AZULFIDINE      TAKE these medications        acetaminophen 325 MG tablet  Commonly known as:  TYLENOL  Take 2 tablets (650 mg total) by mouth every 6 (six) hours as needed for fever, headache, mild pain or moderate pain.     alendronate 70 MG tablet  Commonly known as:  FOSAMAX  70 mg. Take with a  full glass of water on an empty stomach. No food for 30 minutes after taking Takes on Tuesdays     aspirin 81 MG tablet  Take 81 mg by mouth daily.     Calcium-Vitamin D 600-125 MG-UNIT Tabs  Take 1200mg  with vitamin D daily.     diazepam 5 MG tablet  Commonly known as:  VALIUM  TAKE 1 TABLET BY MOUTH EVERY DAY AS NEEDED     dicyclomine 20 MG tablet  Commonly known as:  BENTYL  Take 1 tablet by mouth twice daily as needed for crampy abdominal pain     ergocalciferol 50000 UNITS capsule   Commonly known as:  VITAMIN D2  Take one po weekly x 12 weeks     famotidine 20 MG tablet  Commonly known as:  PEPCID  You can buy this at any drug store or the generic equivalent for use at home.  Discuss with Dr. Olevia Perches on return visist,     folic acid 1 MG tablet  Commonly known as:  FOLVITE  Take 1 tablet (1 mg total) by mouth daily.     HYDROcodone-acetaminophen 5-325 MG per tablet  Commonly known as:  NORCO/VICODIN  Take 1 tablet by mouth every 6 (six) hours as needed for moderate pain.     MULTIVITAMIN PO  Take 1 tablet by mouth daily.     predniSONE 10 MG tablet  Commonly known as:  DELTASONE  TAKE 1 TABLET BY MOUTH EVERY DAY AS DIRECTED       Follow-up Information    Follow up with [name], MD. Schedule an appointment as soon as possible for a visit in 1 week.   Specialty:  Gastroenterology   Why:  Call for follow up in and to find out when to restart your medicines.  They should call you in the next 48 hours to instruct you on when to restart your Crohn's medicines.     Contact information:   [address] [place] [address] [phone]       Follow up with Edward Jolly, MD. Schedule an appointment as soon as possible for a visit in 2 weeks.   Specialty:  General Surgery   Why:  Call for appointment sooner if there is an issue.   Contact information:   [address] [place] [address] [phone]       Follow up with Nance Pear., NP.   Specialty:  Internal Medicine   Why:  CAll and let her know how you are and about events.   Contact information:   [place] [address] [phone]       Signed: Earnstine Regal 06/23/2014, 3:13 PM

## 2014-06-23 NOTE — Progress Notes (Signed)
Physical Therapy Treatment Patient Details Name: [name] MRN: [id number] DOB: 1955-08-12 Today's Date: 06/23/2014    History of Present Illness 59 y/o F, smoker, with hx of MI in the 63s  IBD. Admitted 3/28 with colonic bowel perforation and s/p ex-lap with partial colectomy / resection of colostomy & creation of new colostomy 3/28. On 3/30, she had sudden respiratory decompensation requiring emergent intubation. Clear CXR on admit. Progressed to ARDS 3/31. 59 y/o F, smoker, with hx of MI in the 1980s with s.p stent (details not known), prolonged ICU stay for septic shock from PNA/cellulitis (2009) at [facility] and IBD immunsupprssed on humira, ? Prednisone and sulfasalazine. Admitted 3/28 with colonic bowel perforation and s/p ex-lap with partial colectomy / resection of colostomy & creation of new colostomy 3/28. On 3/30, she had sudden respiratory decompensation requiring emergent intubation.. Progressed to ARDS 3/31.extubated 06/17/14    PT Comments    Pt feeling much better and was able to amb with nursing staff past couple of days.    Follow Up Recommendations        Equipment Recommendations       Recommendations for Other Services       Precautions / Restrictions Precautions Precautions: Fall Precaution Comments: ABD incision/colostomy Restrictions Weight Bearing Restrictions: No    Mobility  Bed Mobility Overal bed mobility: Modified Independent             General bed mobility comments: able to self rise  Transfers Overall transfer level: Modified independent Equipment used: Rolling walker (2 wheeled) Transfers: Sit to/from Stand           General transfer comment: able to self rise  Ambulation/Gait Ambulation/Gait assistance: Min guard;Min assist Ambulation Distance (Feet): 350 Feet Assistive device: Rolling walker (2 wheeled) Gait Pattern/deviations: Step-to pattern;Step-through pattern Gait velocity: decreased    General Gait Details:  much improved   Stairs            Wheelchair Mobility    Modified Rankin (Stroke Patients Only)       Balance                                    Cognition Arousal/Alertness: Awake/alert Behavior During Therapy: WFL for tasks assessed/performed Overall Cognitive Status: Within Functional Limits for tasks assessed                      Exercises      General Comments        Pertinent Vitals/Pain Pain Assessment: 0-10    Home Living                      Prior Function            PT Goals (current goals can now be found in the care plan section)      Frequency       PT Plan      Co-evaluation             End of Session           Time: 1145-1200 PT Time Calculation (min) (ACUTE ONLY): 15 min  Charges:  $Gait Training: 8-22 mins                    G Codes:      Rica Koyanagi  PTA [facility]  Acute  Rehab Pager  [phone]   

## 2014-06-23 NOTE — Consult Note (Signed)
WOC ostomy consult note (NPWT Discontinued today) Stoma type/location: LLQ end colostomy Stomal assessment/size: 1 and 3/4 inches slightly oval with a gulley at the periphery Peristomal assessment: Intact, Treatment options for stomal/peristomal skin: skin barrier ring Output brown effluent  Ostomy pouching:2pc. , 2 and 1/4 inch pouching skin barrier ring Education provided: Patient is provided another to assist with pouch change prior to transfer.  Still seems to have periods of forgetfulness.  Has several setups of supplies for discharge.  Registered with [facility] today. Ready for discharge from an ostomy standpoint. [facility] nursing team will not follow, but will remain available to this patient, the nursing and medical team.  Please re-consult if needed. Thanks, Maudie Flakes, MSN, RN, [place], [place], [place] [phone])

## 2014-06-24 ENCOUNTER — Telehealth: Payer: Self-pay | Admitting: *Deleted

## 2014-06-24 NOTE — Telephone Encounter (Signed)
Spoke with patient and she is in [place] and rehab. She is not sure if she can come on 07/04/14 at 2:00 PM because she "still has a long way to go."  She will let me know.

## 2014-06-24 NOTE — Telephone Encounter (Signed)
Nevin Bloodgood, I will need to see her soon In the office. Rollene Fare, can you, please, set up an appointment in next 2 weeks>? Thanx       Previous Messages     ----- Message -----   From: Willia Craze, NP   Sent: 06/23/2014  4:40 PM    To: Lafayette Dragon, MD  Subject: Clelia Croft,  Surgery called and they are sending patient home today. They asked about her Crohn's meds and follow up with you. I haven't seen this patient before but I told them to hold biologics and mesalamine until our office contacted her. Thanks  Paula       Scheduled OV on 07/04/14 at 2:00 PM. Left a message for patient to call back.

## 2014-07-02 ENCOUNTER — Telehealth: Payer: Self-pay | Admitting: Internal Medicine

## 2014-07-02 NOTE — Telephone Encounter (Signed)
I have spoken to the patient, she is in Rehab in [place], will be going home next week. I asked her to call me when she gets out so I can add her on to the [place]. She will have to get back on Humira when OK with surgery.

## 2014-07-02 NOTE — Telephone Encounter (Signed)
Appointment on 07/04/14 cancelled per patient. Looks as if patient rescheduled to 08/26/14 at 8:30 AM.

## 2014-07-04 ENCOUNTER — Ambulatory Visit: Payer: Commercial Managed Care - HMO | Admitting: Internal Medicine

## 2014-07-10 ENCOUNTER — Telehealth: Payer: Self-pay | Admitting: *Deleted

## 2014-07-10 NOTE — Telephone Encounter (Signed)
I will have to ask [name] to see what can be done to help her to come to the office without paying $45.00 and if [facility] can pick her up.Do not take Humira, she will need and whole new induction regimen , also she needs labs before starting biologicals.

## 2014-07-10 NOTE — Telephone Encounter (Signed)
Left a message for patient to call back. 

## 2014-07-10 NOTE — Telephone Encounter (Signed)
Spoke with patient and she is home now. She is calling like Dr. Olevia Perches told her. She is unable to schedule an OV at this time because she does not have transportation and she does not have the $45 copay. She states she is on the medications that she was getting in rehab which are DIcyclomine, Folic Acid daily,  Pepcid 20 mg daily, Prednisone 10 mg daily, Fosamax 70 mg on Tues., Hydrocodone 5/325 mg prn and Hydrocodone 10/325 mg in AM only.  Reports she does have 2 Humira pens in her refrig. She understands NOT to take this until she hears back from Dr. Olevia Perches. Please, advise.

## 2014-07-11 ENCOUNTER — Telehealth: Payer: Self-pay | Admitting: Internal Medicine

## 2014-07-11 NOTE — Telephone Encounter (Signed)
Spoke with patient and scheduled her on 07/17/14 at 3:00 PM with Dr. Olevia Perches. Patient will call back if she cannot get a ride.

## 2014-07-11 NOTE — Telephone Encounter (Signed)
Left a message for patient to call back. 

## 2014-07-17 ENCOUNTER — Telehealth: Payer: Self-pay | Admitting: *Deleted

## 2014-07-17 ENCOUNTER — Ambulatory Visit (INDEPENDENT_AMBULATORY_CARE_PROVIDER_SITE_OTHER): Payer: Self-pay | Admitting: Internal Medicine

## 2014-07-17 ENCOUNTER — Other Ambulatory Visit (INDEPENDENT_AMBULATORY_CARE_PROVIDER_SITE_OTHER): Payer: Commercial Managed Care - HMO

## 2014-07-17 ENCOUNTER — Encounter: Payer: Self-pay | Admitting: Internal Medicine

## 2014-07-17 VITALS — BP 120/70 | HR 72 | Ht <= 58 in | Wt 94.0 lb

## 2014-07-17 DIAGNOSIS — K50919 Crohn's disease, unspecified, with unspecified complications: Secondary | ICD-10-CM

## 2014-07-17 DIAGNOSIS — K9403 Colostomy malfunction: Secondary | ICD-10-CM

## 2014-07-17 DIAGNOSIS — M818 Other osteoporosis without current pathological fracture: Secondary | ICD-10-CM

## 2014-07-17 DIAGNOSIS — Z8709 Personal history of other diseases of the respiratory system: Secondary | ICD-10-CM

## 2014-07-17 DIAGNOSIS — Z933 Colostomy status: Secondary | ICD-10-CM

## 2014-07-17 LAB — COMPREHENSIVE METABOLIC PANEL
ALT: 9 U/L (ref 0–35)
AST: 17 U/L (ref 0–37)
Albumin: 4.3 g/dL (ref 3.5–5.2)
Alkaline Phosphatase: 77 U/L (ref 39–117)
BILIRUBIN TOTAL: 0.3 mg/dL (ref 0.2–1.2)
BUN: 7 mg/dL (ref 6–23)
CHLORIDE: 97 meq/L (ref 96–112)
CO2: 30 mEq/L (ref 19–32)
CREATININE: 0.67 mg/dL (ref 0.40–1.20)
Calcium: 10.2 mg/dL (ref 8.4–10.5)
GFR: 95.56 mL/min (ref >60.00)
Glucose, Bld: 131 mg/dL — ABNORMAL HIGH (ref 70–99)
Potassium: 4.8 mEq/L (ref 3.5–5.1)
SODIUM: 133 meq/L — AB (ref 135–145)
Total Protein: 8.7 g/dL — ABNORMAL HIGH (ref 6.0–8.3)

## 2014-07-17 LAB — SEDIMENTATION RATE: Sed Rate: 37 mm/hr — ABNORMAL HIGH (ref 0–22)

## 2014-07-17 MED ORDER — METRONIDAZOLE 250 MG PO TABS: 250.0000 mg | ORAL_TABLET | Freq: Two times a day (BID) | ORAL | Status: DC

## 2014-07-17 MED ORDER — MESALAMINE 400 MG PO CPDR: 800.0000 mg | DELAYED_RELEASE_CAPSULE | Freq: Three times a day (TID) | ORAL | Status: DC

## 2014-07-17 MED ORDER — DIAZEPAM 5 MG PO TABS: 5.0000 mg | ORAL_TABLET | Freq: Two times a day (BID) | ORAL | Status: DC | PRN

## 2014-07-17 MED ORDER — HYDROCODONE-ACETAMINOPHEN 10-325 MG PO TABS: 1.0000 | ORAL_TABLET | Freq: Every day | ORAL | Status: DC

## 2014-07-17 MED ORDER — HYDROCODONE-ACETAMINOPHEN 5-325 MG PO TABS: 1.0000 | ORAL_TABLET | Freq: Four times a day (QID) | ORAL | Status: DC | PRN

## 2014-07-17 NOTE — Patient Instructions (Addendum)
Your physician has requested that you go to the basement for lab work before leaving today.  We have printed your prescriptions.   You have been given samples of Delzicol. Dr Corey Harold

## 2014-07-17 NOTE — Telephone Encounter (Signed)
Per Dr. Olevia Perches, patient should restart Humira with induction regimen then 40 mg IM every 2 weeks. Called [facility] and left verbal rx on pharmacy line for Humira Pen 40mg /0.8 ml.  Induction at 160 mg week 0, week 2- 80 mg then 40 mg every other week.

## 2014-07-17 NOTE — Telephone Encounter (Signed)
Home health certification and plan of care received via fax from [facility]. Forwarded to [employer]. JG//CMA

## 2014-07-17 NOTE — Progress Notes (Signed)
[name] Oct 14, 1955 [id number]  Note: This dictation was prepared with Dragon digital system. Any transcriptional errors that result from this procedure are unintentional.   History of Present Illness: This is a 59 year old white female 3 weeks post discharge from the hospital after undergoing emergency exploratory laparotomy and resection of colostomy and distal ileum for severe Crohn's disease stricture and perforation. on 06/02/2014. She had postoperative complication of ARDS.. Patient was found to have a severe colonic stricture when the procedure was  attempted  but even upper endoscope could not intubate the stoma. Prior colonoscopy via colostomy was in 2011/. Digital dilation was attempted and upper endoscope passed through a severely inflamed  peristomal colon .Stricture was about 8 cm long and proximal to that was a normal appearing colon. She had a prior resection of descending colon in 1988 and subtotal colectomy and colostomy in 1990s. She also had a drainage of peristomal abscess in April 2015. Patient was on Humira prior to the  hospitalization 40 mg weekly. as well as on low-dose prednisone 10 mg daily , sulfasalazine 500 mg twice a day, as well as Flagyl 250 mg twice a day.She was receiving samples of medications over the years because of the cost restrictions.She was requiring more frequent  pain medications.  although in the past she never had a tendency to abuse pain medications. All of these medications were discontinued during the hospitalization although she was receiving IV steroids to prevent steroid withdrawal.  . Prior colonoscopy via colostomy was done in 2011. Patient comes today for follow-up. He is starting to feel stronger. Colostomy is functioning well. Her appetite has been good and she denies nausea or  vomiting but she has rectal pain and small amount of drainage from  The rectal stump. She denies fever. She is asking for  refill on multiple medications.    Past  Medical History  Diagnosis Date  . Coronary atherosclerosis of unspecified type of vessel, native or graft   . Anal fistula   . Arthritis   . Personal history of colonic polyps   . Crohn's colitis   . History of colostomy   . Heart murmur   . Hx of myocardial infarction 1986    Past Surgical History  Procedure Laterality Date  . Cholecystectomy    . Colon resection  1988    1988 she thinks  . Angioplasty      1987  . Appendectomy    . Tubal ligation  1993  . Colostomy  2012  . Irrigation and debridement abscess N/A 06/18/2013    Procedure: Irrigation and Debridiment of peristomal abcess;  Surgeon: Gayland Curry, MD;  Location: [facility];  Service: General;  Laterality: N/A;  . Laparotomy N/A 06/02/2014    Procedure: EXPLORATORY LAPAROTOMY/PARTIAL COLECTOMY COLOSTOMY;  Surgeon: Excell Seltzer, MD;  Location: [facility];  Service: General;  Laterality: N/A;    Allergies  Allergen Reactions  . Remicade [Infliximab]     convulsions    Family history and social history have been reviewed.  Review of Systems: Denies diarrhea fever. Denies nausea vomiting. Positive for rectal pain  The remainder of the 10 point ROS is negative except as outlined in the H&P  Physical Exam: General Appearance thin chronically ill-appearing alert and oriented very pleasant and cooperative, in no distress Eyes  Non icteric  HEENT  Non traumatic, normocephalic  Mouth No lesion, tongue papillated, no cheilosis Neck Supple without adenopathy, thyroid not enlarged, no carotid bruits, no JVD Lungs Clear to  auscultation bilaterally COR Normal S1, normal S2, regular rhythm, no murmur, quiet precordium Abdomen: functioning ileostomy left middle quadrant. Healthy-appearing stoma. Normoactive bowel sounds. Well-healed surgical scars. Dressing covering the vertical scar around the umbilicus. Rectal  Crohn's disease with external hemorrhoidal and skin tags. Scarred perianal are from prior fistulous disease. No  visible drainage. Digital exam not attempted Extremities  No pedal edema Skin No lesions Neurological Alert and oriented x 3 Psychological Normal mood and affect  Assessment and Plan:   59 year old white female with severe Crohn's disease of the colon and rectum necessitating at least 3 colon resections, last one was in emergency resection after attempted dilation of a severe stomal stricture. Disease segment of the distal colon was resected and a new  colostomy created from the healthy appearing colon.. Patient had a stormy hospital course but slowly recovered and rehabilitated . She is now able to function independently at home and she needs  to restart on Humira induction regimen followed by maintenance of 40 mg every 2 weeks.She has a very aggressive Crohn's disease ans  We will again attempt to use maximal medical therapy to prevent further strictures. Flagyl 250 mg twice a day. Samples of mesalamine 400 mg 2 tablets 3 times a day given to last one month. We will obtain blood work including CBC, metabolic panel. Sedimentation rate today. Refill Norco 10/325 and Valium 5 mg one twice a day when necessary rectal spasm. Return appointment in 6 weeks. Patient expressed appreciation of my care over the years despite of recent  complications necessitating surgery.She is planning to continue her follow up with me.   [name] 07/17/2014

## 2014-07-18 LAB — CBC WITH DIFFERENTIAL/PLATELET
BASOS PCT: 0.3 % (ref 0.0–3.0)
Basophils Absolute: 0 10*3/uL (ref 0.0–0.1)
Eosinophils Absolute: 0 10*3/uL (ref 0.0–0.7)
Eosinophils Relative: 0.1 % (ref 0.0–5.0)
HCT: 39 % (ref 36.0–46.0)
HEMOGLOBIN: 12.8 g/dL (ref 12.0–15.0)
Lymphocytes Relative: 13.7 % (ref 12.0–46.0)
Lymphs Abs: 1.9 10*3/uL (ref 0.7–4.0)
MCHC: 32.8 g/dL (ref 30.0–36.0)
MCV: 87.4 fl (ref 78.0–100.0)
MONOS PCT: 0.7 % — AB (ref 3.0–12.0)
Monocytes Absolute: 0.1 10*3/uL (ref 0.1–1.0)
NEUTROS ABS: 11.8 10*3/uL — AB (ref 1.4–7.7)
Neutrophils Relative %: 85.2 % — ABNORMAL HIGH (ref 43.0–77.0)
Platelets: 575 10*3/uL — ABNORMAL HIGH (ref 150.0–400.0)
RBC: 4.46 Mil/uL (ref 3.87–5.11)
RDW: 14.1 % (ref 11.5–15.5)
WBC: 13.9 10*3/uL — AB (ref 4.0–10.5)

## 2014-07-21 ENCOUNTER — Telehealth: Payer: Self-pay | Admitting: *Deleted

## 2014-07-21 NOTE — Telephone Encounter (Signed)
Called in Rx for induction regimen for Humira to [facility] the pharmacist.

## 2014-07-25 ENCOUNTER — Telehealth: Payer: Self-pay | Admitting: Family

## 2014-07-25 DIAGNOSIS — Z933 Colostomy status: Secondary | ICD-10-CM

## 2014-07-25 NOTE — Telephone Encounter (Signed)
Caller name: Manuela Schwartz with [facility], Dr. Excell Seltzer Can be reached: [phone] direct line ICD-10 for pt: S31.609 NPI: [id number] Reason for call: Referral for abdominal wound needed. She is coming in for post op visit. Needs referral to cover. Appt scheduled for 07/30/14. Manuela Schwartz states to please call her with the referral authorization as she frequently has to call us the day of pts appt. Ok to leave on her voicemail.

## 2014-07-28 NOTE — Telephone Encounter (Signed)
Josem Kaufmann #[id number]

## 2014-07-29 ENCOUNTER — Telehealth: Payer: Self-pay | Admitting: Internal Medicine

## 2014-07-29 NOTE — Telephone Encounter (Signed)
Left a message for [facility] to call back.

## 2014-07-30 NOTE — Telephone Encounter (Signed)
Spoke with [facility] and gave them the information about hospitalization of patient.

## 2014-07-30 NOTE — Telephone Encounter (Signed)
Left a message again for [facility] to call back.

## 2014-08-13 ENCOUNTER — Telehealth: Payer: Self-pay | Admitting: *Deleted

## 2014-08-13 NOTE — Telephone Encounter (Signed)
Patient is scheduled on 08/26/14 at 8:30 AM already.

## 2014-08-13 NOTE — Telephone Encounter (Signed)
She should have an appointment with me in 6 weeks( from last OV 07/17/2014)

## 2014-08-13 NOTE — Telephone Encounter (Signed)
Spoke with patient and she is doing ok. She has just completed the induction of Humira. She states prior to surgery she was on it weekly. Per OV note she is to be on Humira 40 mg every 2 weeks now. Just want to clarify this is corrrect.

## 2014-08-14 NOTE — Telephone Encounter (Signed)
Patient will come for appointment 

## 2014-08-14 NOTE — Telephone Encounter (Signed)
Left a message for patient to call back. 

## 2014-08-18 ENCOUNTER — Telehealth: Payer: Self-pay | Admitting: Internal Medicine

## 2014-08-18 NOTE — Telephone Encounter (Signed)
Patient called in and requested refills on the following medications. You had given to patient on 07/17/14. Can the patient get refills on all three of these? Please advise.  1. Diazepam (VALIUM), 5 mg  Pt takes 1 tablet, po, q12h, prn for anxiety  Dispensed: 60 with no refills on 07/17/14  2. Hydrocodone-acetaminophen (NORCO), 10-325 mg  Pt takes 1 tablet, po, after breakfast  Dispensed: 30 with no refills on 07/17/14  3. Hydrocodone-acetaminophen (NORCO/VICODIN), 5-325 mg  Pt takes 1 tablet, po, q6h, prn for moderate pain  Dispensed: 90 with no refills on 07/17/14  Thank you.

## 2014-08-18 NOTE — Telephone Encounter (Signed)
Please refill all, I will talk to her at her next appointment about reducing her pain meds.

## 2014-08-19 MED ORDER — DIAZEPAM 5 MG PO TABS: 5.0000 mg | ORAL_TABLET | Freq: Two times a day (BID) | ORAL | Status: DC | PRN

## 2014-08-19 MED ORDER — HYDROCODONE-ACETAMINOPHEN 5-325 MG PO TABS: 1.0000 | ORAL_TABLET | Freq: Four times a day (QID) | ORAL | Status: DC | PRN

## 2014-08-19 MED ORDER — HYDROCODONE-ACETAMINOPHEN 10-325 MG PO TABS: 1.0000 | ORAL_TABLET | Freq: Every day | ORAL | Status: DC

## 2014-08-19 NOTE — Telephone Encounter (Signed)
Per Dr. Nichola Sizer approval, printed out Rxs for hydrocodone-acetaminophen [facility]), 10-325 mg, #30 with no refills; hydrocodone-acetaminophen (NORCO/VICODIN), 5-325 mg, #90 with no refills and diazepam (VALIUM), 5 mg, #60 with no refills. Called patient at [phone] (cell #) to let her know she could pickup Rx's from the receptionist. Patient stated they understood.

## 2014-08-25 ENCOUNTER — Telehealth: Payer: Self-pay | Admitting: *Deleted

## 2014-08-25 NOTE — Telephone Encounter (Signed)
-----   Message from Lafayette Dragon, MD sent at 08/25/2014  1:21 PM EDT ----- Regarding: canceled Lacie Draft, can you , please ,call [name] to see why she canceled? She did not have money for a cab or does not have a ride. Ask her how she is doing on Humira. She will need blood tests,We can void office visit charge like we did  last time.Did she reschedule?

## 2014-08-25 NOTE — Telephone Encounter (Signed)
Spoke with patient and she states she is getting her car inspected and getting tags so she will soon have transportation. She is scheduled on 10/10/14. She states she is doing good on Humira. She is due for next injection on 08/27/14.

## 2014-08-25 NOTE — Telephone Encounter (Signed)
OK, we will have to check on her in 4 weeks by phone to make sure she is OK, can you  Send me a reminder? Thanx

## 2014-08-26 ENCOUNTER — Ambulatory Visit: Payer: Commercial Managed Care - HMO | Admitting: Internal Medicine

## 2014-08-26 NOTE — Telephone Encounter (Signed)
Staff message sent in one month to Dr. Olevia Perches.

## 2014-09-04 ENCOUNTER — Telehealth: Payer: Self-pay | Admitting: Internal Medicine

## 2014-09-04 NOTE — Telephone Encounter (Signed)
Spoke with pt. She can come earlier than  August, but has to be in the afternoon. Thanx DB

## 2014-09-05 NOTE — Telephone Encounter (Signed)
Spoke with patient and scheduled on 09/16/14 at 1:00 PM.

## 2014-09-11 ENCOUNTER — Telehealth: Payer: Self-pay | Admitting: Internal Medicine

## 2014-09-11 NOTE — Telephone Encounter (Signed)
Pt called me at home reporting a tel call from our office [phone] at 5.30 pm, but did not know who it was. There is no record in [facility] that our  office called. Besides , the office closes at 5.00 pm. I told her that I will ask tomorrow to find out who called her after hours. The last call was on 09/04/2014  To reschedule her appointment for 09/16/2014. She mentioned  Having hard time with her finances, may lose the house. She hung up  When I  Said that I will find out who called her.

## 2014-09-16 ENCOUNTER — Ambulatory Visit (INDEPENDENT_AMBULATORY_CARE_PROVIDER_SITE_OTHER): Payer: Commercial Managed Care - HMO | Admitting: Internal Medicine

## 2014-09-16 ENCOUNTER — Encounter: Payer: Self-pay | Admitting: Internal Medicine

## 2014-09-16 ENCOUNTER — Other Ambulatory Visit (INDEPENDENT_AMBULATORY_CARE_PROVIDER_SITE_OTHER): Payer: Commercial Managed Care - HMO

## 2014-09-16 VITALS — BP 120/70 | HR 79 | Ht 59.5 in | Wt 92.8 lb

## 2014-09-16 DIAGNOSIS — D899 Disorder involving the immune mechanism, unspecified: Secondary | ICD-10-CM

## 2014-09-16 DIAGNOSIS — K50919 Crohn's disease, unspecified, with unspecified complications: Secondary | ICD-10-CM

## 2014-09-16 DIAGNOSIS — D849 Immunodeficiency, unspecified: Secondary | ICD-10-CM

## 2014-09-16 DIAGNOSIS — K9403 Colostomy malfunction: Secondary | ICD-10-CM | POA: Diagnosis not present

## 2014-09-16 DIAGNOSIS — Z933 Colostomy status: Secondary | ICD-10-CM

## 2014-09-16 LAB — CBC WITH DIFFERENTIAL/PLATELET
BASOS ABS: 0.1 10*3/uL (ref 0.0–0.1)
BASOS PCT: 0.7 % (ref 0.0–3.0)
Eosinophils Absolute: 0.2 10*3/uL (ref 0.0–0.7)
Eosinophils Relative: 2.2 % (ref 0.0–5.0)
HCT: 39.5 % (ref 36.0–46.0)
HEMOGLOBIN: 13 g/dL (ref 12.0–15.0)
LYMPHS PCT: 54 % — AB (ref 12.0–46.0)
Lymphs Abs: 5.3 10*3/uL — ABNORMAL HIGH (ref 0.7–4.0)
MCHC: 32.9 g/dL (ref 30.0–36.0)
MCV: 84.7 fl (ref 78.0–100.0)
Monocytes Absolute: 0.4 10*3/uL (ref 0.1–1.0)
Monocytes Relative: 4.2 % (ref 3.0–12.0)
Neutro Abs: 3.8 10*3/uL (ref 1.4–7.7)
Neutrophils Relative %: 38.9 % — ABNORMAL LOW (ref 43.0–77.0)
Platelets: 392 10*3/uL (ref 150.0–400.0)
RBC: 4.66 Mil/uL (ref 3.87–5.11)
RDW: 14.7 % (ref 11.5–15.5)
WBC: 9.9 10*3/uL (ref 4.0–10.5)

## 2014-09-16 LAB — SEDIMENTATION RATE: Sed Rate: 37 mm/hr — ABNORMAL HIGH (ref 0–22)

## 2014-09-16 MED ORDER — DIAZEPAM 5 MG PO TABS: 5.0000 mg | ORAL_TABLET | Freq: Two times a day (BID) | ORAL | Status: DC | PRN

## 2014-09-16 MED ORDER — METRONIDAZOLE 250 MG PO TABS: ORAL_TABLET | ORAL | Status: DC

## 2014-09-16 MED ORDER — HYDROCODONE-ACETAMINOPHEN 10-325 MG PO TABS: 1.0000 | ORAL_TABLET | Freq: Every day | ORAL | Status: DC

## 2014-09-16 MED ORDER — HYDROCODONE-ACETAMINOPHEN 5-325 MG PO TABS: 1.0000 | ORAL_TABLET | Freq: Four times a day (QID) | ORAL | Status: DC | PRN

## 2014-09-16 NOTE — Progress Notes (Signed)
[name] 12-Jun-1955 [id number]  Note: This dictation was prepared with Dragon digital system. Any transcriptional errors that result from this procedure are unintentional.   History of Present Illness: This is a 59 year old white female with the severe complicated Crohn's disease involving colon, small bowel and rectum. Prior colon resections in 1988 and total colectomy in 2012. Drainage of peristomal abscess in April 2015. Recent hospitalization for stomal stricture dilation resulting in pneumoperitoneum,resection of the stenosed segment of distal colon and and revision of colostomy. She has done well since last visit 2 months ago. Her weight is 92 pounds. ( was 94 lbs on last visit). She denies dysphagia.Apetite has been good. She has alternating bowel habits via colostomy. She has been on maintenance dose of Humira 40 mg IM every 2 weeks. Her hemoglobin 0n 07/17/2014 was  12.8, sedimentation rate up to 37 and albumin normal at 4.3. She still has rectal pain secondary to  rectal Crohn's disease but the drainage has decreased.on Flagyl. She denies fever or bleeding. She needs refills on hydrocodone, Valium and Norco. She is back smoking    Past Medical History  Diagnosis Date  . Coronary atherosclerosis of unspecified type of vessel, native or graft   . Anal fistula   . Arthritis   . Personal history of colonic polyps   . Crohn's colitis   . History of colostomy   . Heart murmur   . Hx of myocardial infarction 1986    Past Surgical History  Procedure Laterality Date  . Cholecystectomy    . Colon resection  1988    1988 she thinks  . Angioplasty      1987  . Appendectomy    . Tubal ligation  1993  . Colostomy  2012  . Irrigation and debridement abscess N/A 06/18/2013    Procedure: Irrigation and Debridiment of peristomal abcess;  Surgeon: Gayland Curry, MD;  Location: [facility];  Service: General;  Laterality: N/A;  . Laparotomy N/A 06/02/2014    Procedure: EXPLORATORY  LAPAROTOMY/PARTIAL COLECTOMY COLOSTOMY;  Surgeon: Excell Seltzer, MD;  Location: [facility];  Service: General;  Laterality: N/A;    Allergies  Allergen Reactions  . Remicade [Infliximab]     convulsions    Family history and social history have been reviewed.  Review of Systems: Rectal pain. Small amount of rectal drainage. Denies fever  The remainder of the 10 point ROS is negative except as outlined in the H&P  Physical Exam: General Appearance thin chronically ill-appearing in no distress Eyes  Non icteric  HEENT  Non traumatic, normocephalic  Mouth No lesion, tongue papillated, no cheilosis Neck Supple without adenopathy, thyroid not enlarged, no carotid bruits, no JVD Lungs Clear to auscultation bilaterally COR Normal S1, normal S2, regular rhythm, no murmur, quiet precordium Abdomen soft with the colostomy appliance in left middle quadrant. Mild tenderness surrounding colostomy. No edema or rebound. Normal bowel sounds. Stoma is widely patent. There is a small amount of yellow Hemoccult-negative stool. Digital exam of the stoma shows normal mucosa Rectal large external skin tags consistent with a rectal Crohn's disease. Digital exam not attempted Extremities  No pedal edema Skin No lesions Neurological Alert and oriented x 3 Psychological Normal mood and affect  Assessment and Plan:   59 year old white female with complicated Crohn's disease, multiple prior operations, now 3 months post  revision of the stomal stricture.. This was an emergency laparotomy following an unsuccessful dilatation of the stricture. We'll refill  her medications and continue Humira 40  mg every 2 weeks. Also samples of Pentasa 500 mg she will take 2 a day. We will resume Flagyl 250 mg daily for rectal Crohn's disease since it was effective in decreasing the rectal drainage. Rechecking has sedimentation rate and CBC today office visit 8 weeks    [name] 09/16/2014

## 2014-09-16 NOTE — Patient Instructions (Addendum)
Your physician has requested that you go to the basement for lab work before leaving today.  We have sent medications to your pharmacy for you to pick up at your convenience  Take Pentasa Samples. One tablet two times a day.   Dr Charlett Blake

## 2014-09-30 ENCOUNTER — Telehealth: Payer: Self-pay | Admitting: Family

## 2014-09-30 NOTE — Telephone Encounter (Signed)
Called and spoke with [facility] to receive clarification as to what is needed. PA for supplies is not needed, what is needed is a referral from our office since we are pt's [facility] PCP. Spoke with Danise Mina and she completed referral request and it is pending at this time. Faxed Jasmine back informing her of this.

## 2014-09-30 NOTE — Telephone Encounter (Signed)
Referral completed by Danise Mina was denied. I called [employer] and they stated that [place] is out of network and a request needs to be sent to [facility] Management. Information submitted to [employer] via fax at [phone] successfully. Awating determination. JG//CMA

## 2014-09-30 NOTE — Telephone Encounter (Signed)
This has to come from Dr. Diona Browner' office since they are her gastro doctor.

## 2014-09-30 NOTE — Telephone Encounter (Signed)
Caller name: Delana Meyer with [facility] Can be reached: [phone]  Reason for call: Authorization is needed for ostomy supplies and need it urgently. Please follow up with [facility] as the patient is very upset and will be contacting our office. They cannot send supplies until they have auth/documentation.

## 2014-10-08 ENCOUNTER — Telehealth: Payer: Self-pay | Admitting: *Deleted

## 2014-10-08 NOTE — Telephone Encounter (Signed)
Left a message for patient to call back. 

## 2014-10-08 NOTE — Telephone Encounter (Signed)
Spoke with patient and scheduled OV on 10/21/14 at 3:00 PM.

## 2014-10-10 ENCOUNTER — Ambulatory Visit: Payer: Commercial Managed Care - HMO | Admitting: Internal Medicine

## 2014-10-17 ENCOUNTER — Encounter: Payer: Self-pay | Admitting: Internal Medicine

## 2014-10-21 ENCOUNTER — Telehealth: Payer: Self-pay | Admitting: Internal Medicine

## 2014-10-21 ENCOUNTER — Ambulatory Visit: Payer: Commercial Managed Care - HMO | Admitting: Internal Medicine

## 2014-10-21 NOTE — Telephone Encounter (Signed)
Patient was no show for appointment with me  this afternoon. I have called her at home and left a message concerning  the fact that  Nobody but her  Is allowed to make payments on her [facility] because it's against Medicare regulations. She could lose her Medicare insurance any party does that.

## 2014-10-24 ENCOUNTER — Telehealth: Payer: Self-pay | Admitting: Internal Medicine

## 2014-10-24 NOTE — Telephone Encounter (Signed)
10/24/2014-Patient dismissed from [facility] by [name] MD, effective 10/21/2014. Dismissal letter sent out by certified / registered mail, [employer] tracking number [id number] [phone] [id number], rmf.

## 2014-10-27 NOTE — Telephone Encounter (Signed)
Received signed domestic return receipt verifying delivery of certified letter on October 27, 2014. Article number [id number] [place]

## 2014-10-29 ENCOUNTER — Telehealth: Payer: Self-pay | Admitting: Family

## 2014-10-29 NOTE — Telephone Encounter (Signed)
Completed [employer] pre-auth form and faxed to [phone] successfully. JG//CMA

## 2014-10-29 NOTE — Telephone Encounter (Signed)
Caller name: Danae Chen with [employer] Can be reached: [phone] Fax: [phone]  Reason for call: trying to get authorization for [employer] for supplies [phone] for wafer CTF, A4425 for pouch, A4385 for seal [employer]) for colostomy supplies DX code Z93.3, [employer] ph # [phone] They do not need written order, they have it from Dr. Eldridge Abrahams

## 2014-10-29 NOTE — Telephone Encounter (Signed)
Jessica can you assist with this?

## 2014-11-12 ENCOUNTER — Telehealth: Payer: Self-pay | Admitting: *Deleted

## 2014-11-12 NOTE — Telephone Encounter (Signed)
Pt signed ROI received via fax from [facility]. Forwarded to [place] to scan/email to medical records. JG//CMA

## 2014-11-18 ENCOUNTER — Ambulatory Visit: Payer: Commercial Managed Care - HMO | Admitting: Internal Medicine

## 2014-12-22 NOTE — Telephone Encounter (Signed)
Medication sent.

## 2015-09-14 ENCOUNTER — Other Ambulatory Visit: Payer: Self-pay | Admitting: Internal Medicine

## 2015-09-14 DIAGNOSIS — Z1231 Encounter for screening mammogram for malignant neoplasm of breast: Secondary | ICD-10-CM

## 2015-09-18 ENCOUNTER — Ambulatory Visit
Admission: RE | Admit: 2015-09-18 | Discharge: 2015-09-18 | Disposition: A | Discharge status: Home / Self Care | Payer: Medicare HMO | Source: Ambulatory Visit | Attending: Internal Medicine | Admitting: Internal Medicine

## 2015-09-18 DIAGNOSIS — Z1231 Encounter for screening mammogram for malignant neoplasm of breast: Secondary | ICD-10-CM

## 2016-02-26 IMAGING — DX DG CHEST 1V PORT
1 series · 1 of 1 positions shown · non-contrast
Comparison: 06/07/2014

CLINICAL DATA: Acute respiratory failure.  On ventilator.

EXAM:
PORTABLE CHEST - 1 VIEW

[chest ap]
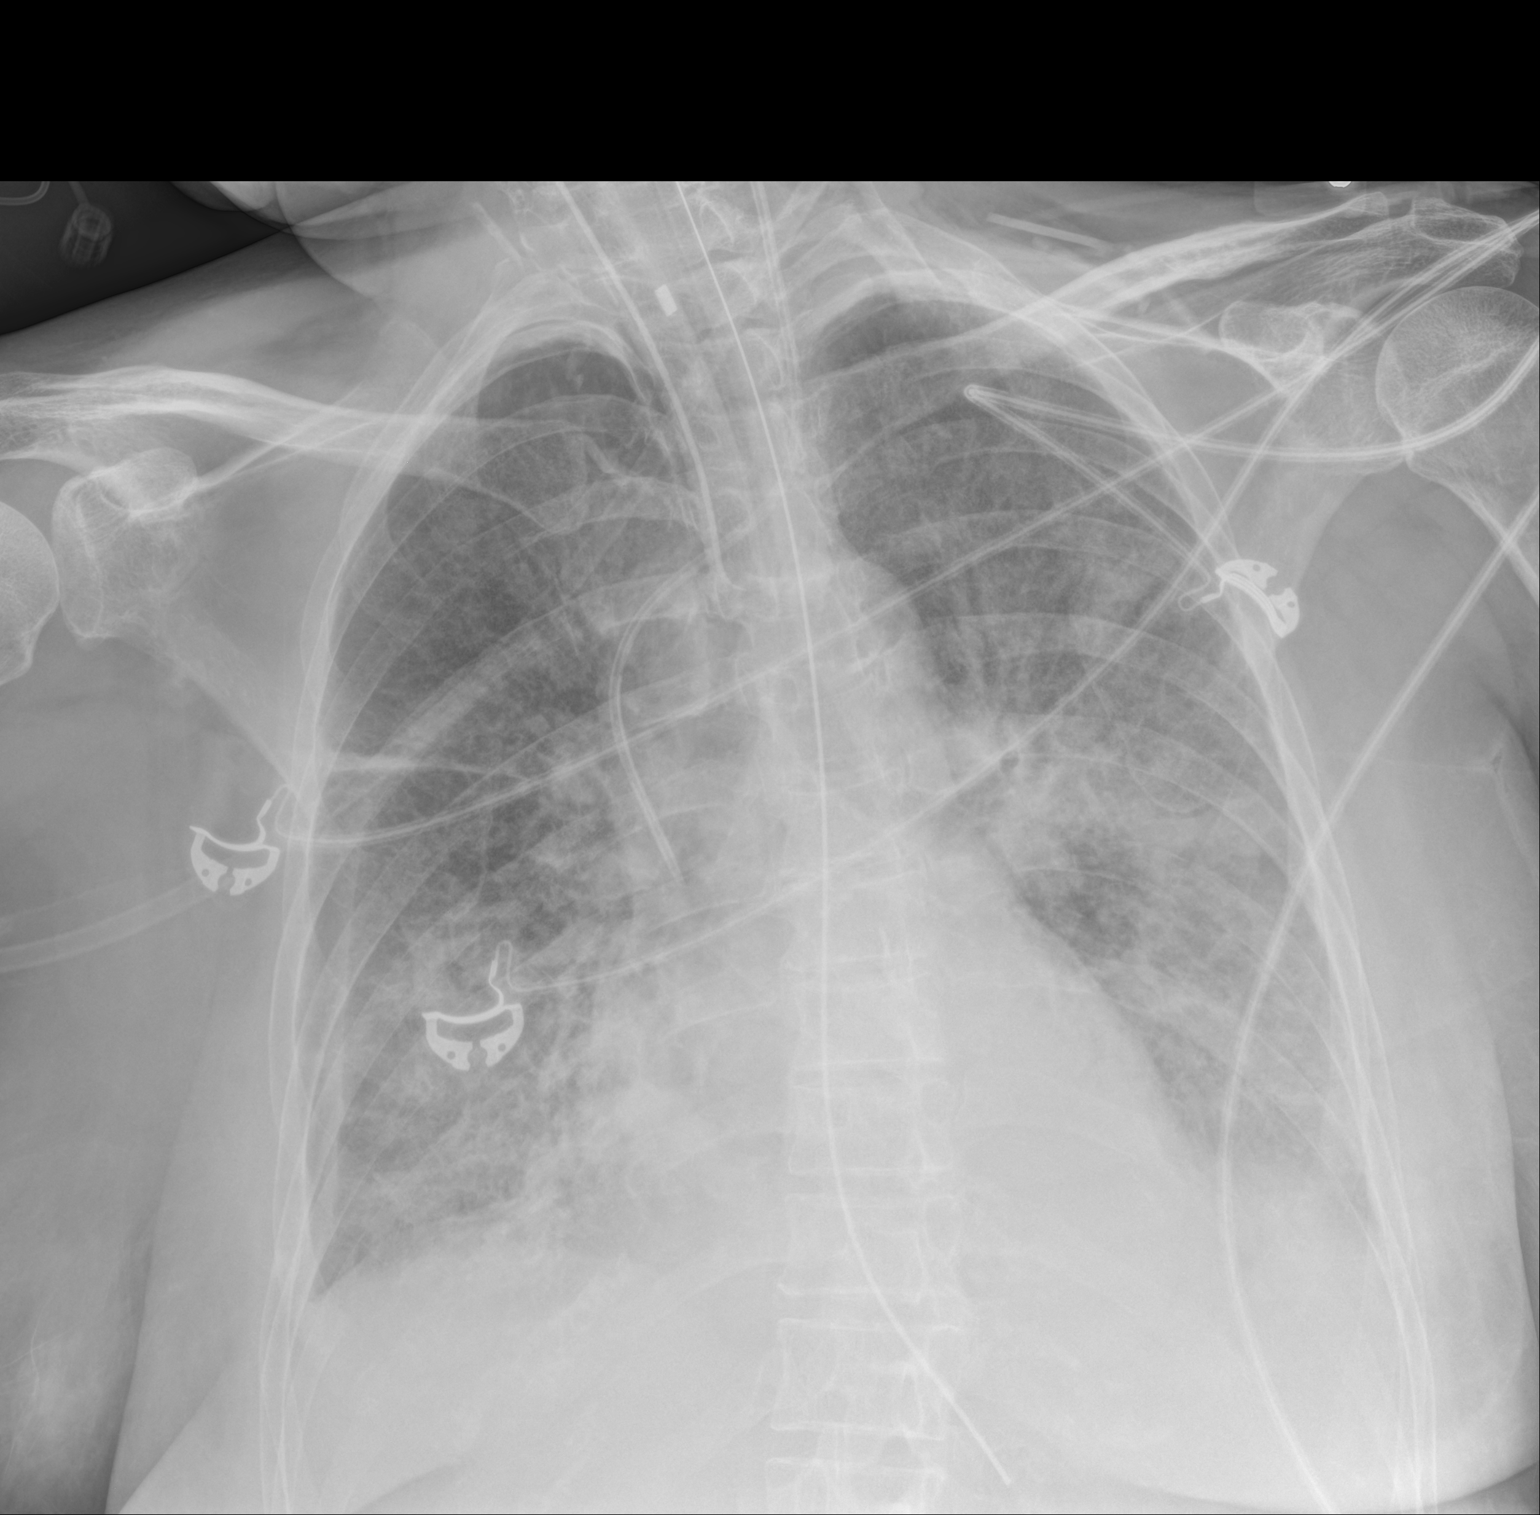

[1 of 1 positions shown; findings below may reference images not displayed]

FINDINGS: Support lines and tubes in appropriate position. Endotracheal tube
also remains in appropriate position. No pneumothorax visualized.

Increased lung volumes are noted bilaterally. Mild decrease in right
upper lobe atelectasis seen.

Diffuse bilateral airspace disease shows mild improvement in lung
bases. Probable small bilateral pleural effusions again noted. Heart
size is at the upper lobes normal stable.
IMPRESSION: Improved aeration of both lungs. Diffuse bilateral airspace
disease/edema shows decrease in lung bases. Right upper lobe
atelectasis has also improved.

## 2016-02-28 IMAGING — DX DG CHEST 1V PORT
1 series · 1 of 1 positions shown · non-contrast
Comparison: 06/09/2014 .

CLINICAL DATA: Respiratory failure.

EXAM:
PORTABLE CHEST - 1 VIEW

[chest ap]
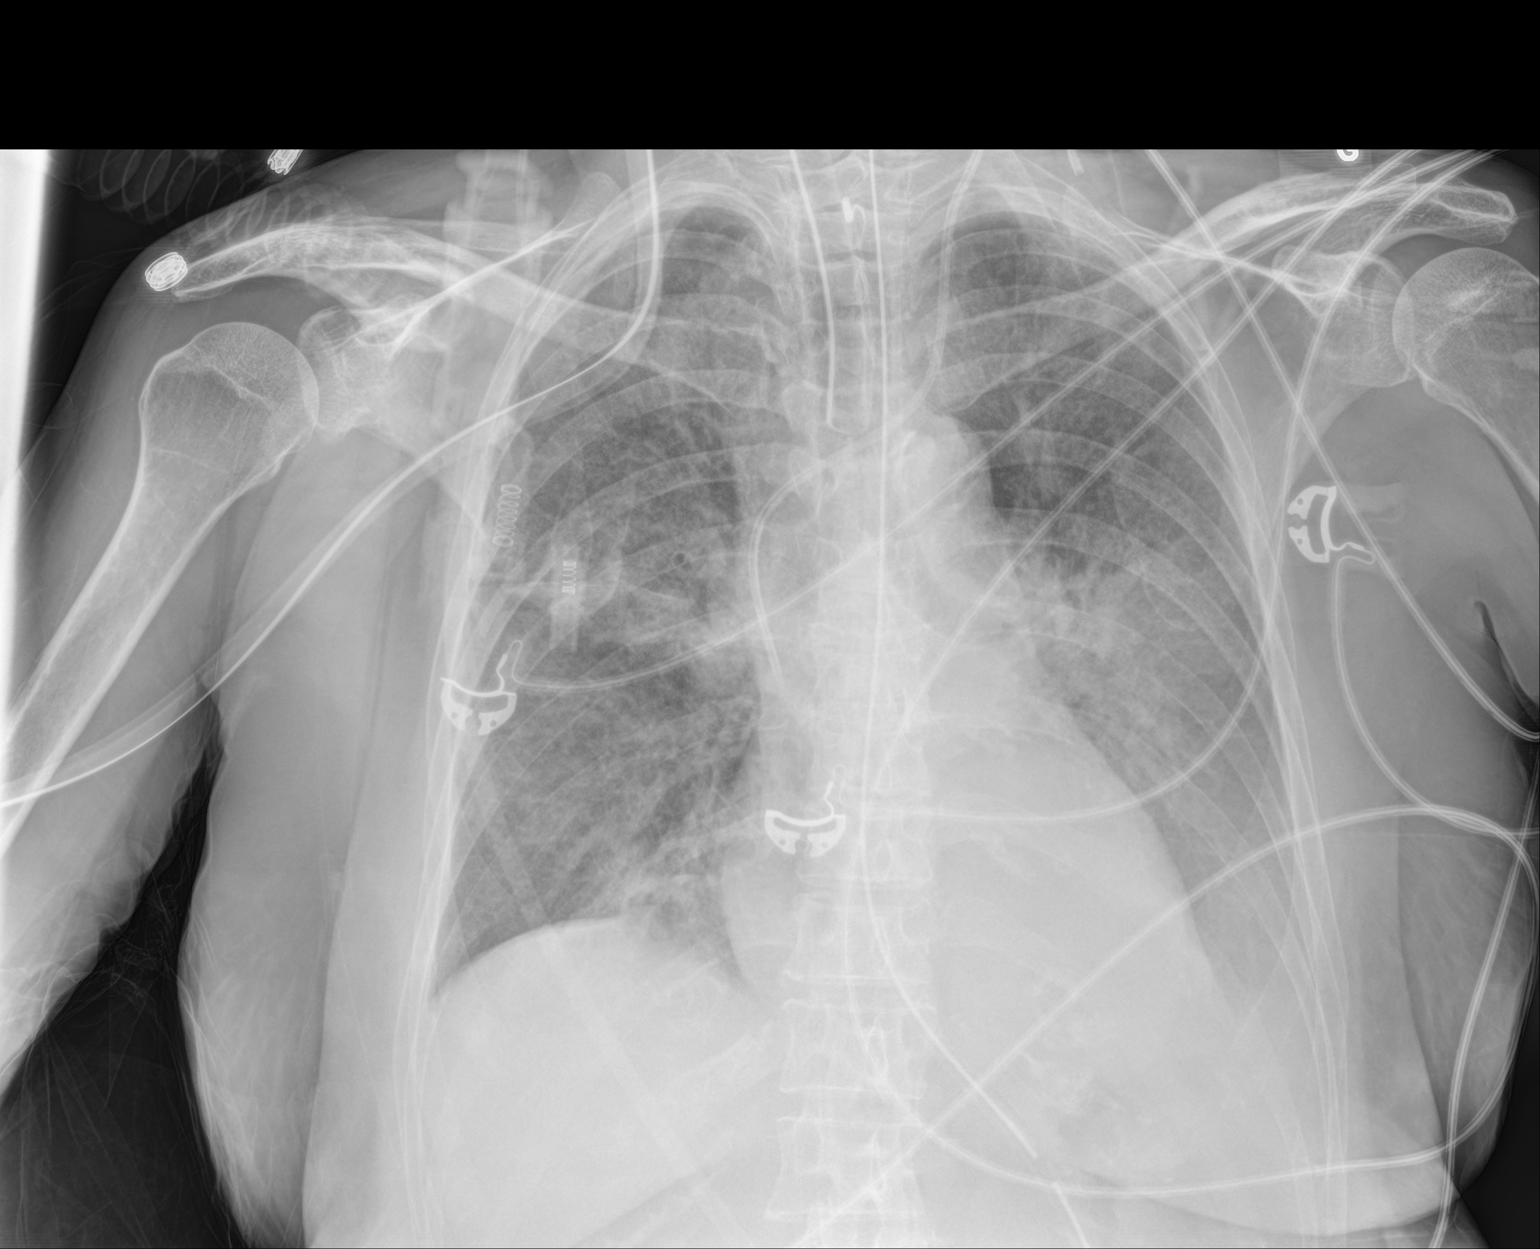

[1 of 1 positions shown; findings below may reference images not displayed]

FINDINGS: Endotracheal tube, left IJ line, NG tube in stable position. Heart
size stable. Persistent but slightly improving bilateral diffuse
airspace disease. Right mid lung and bibasilar subsegmental
atelectasis P. Small left pleural effusion. No pneumothorax.
IMPRESSION: 1. Lines and tubes in stable position.
2. Persistent diffuse bilateral airspace disease, slight improvement
from prior exam. Right mid and bibasilar subsegmental atelectasis.
Small left pleural effusion again noted.

## 2016-02-29 IMAGING — DX DG CHEST 1V PORT
1 series · 1 of 1 positions shown · non-contrast
Comparison: 06/10/2014 .

CLINICAL DATA: Respiratory failure.

EXAM:
PORTABLE CHEST - 1 VIEW

[chest ap]
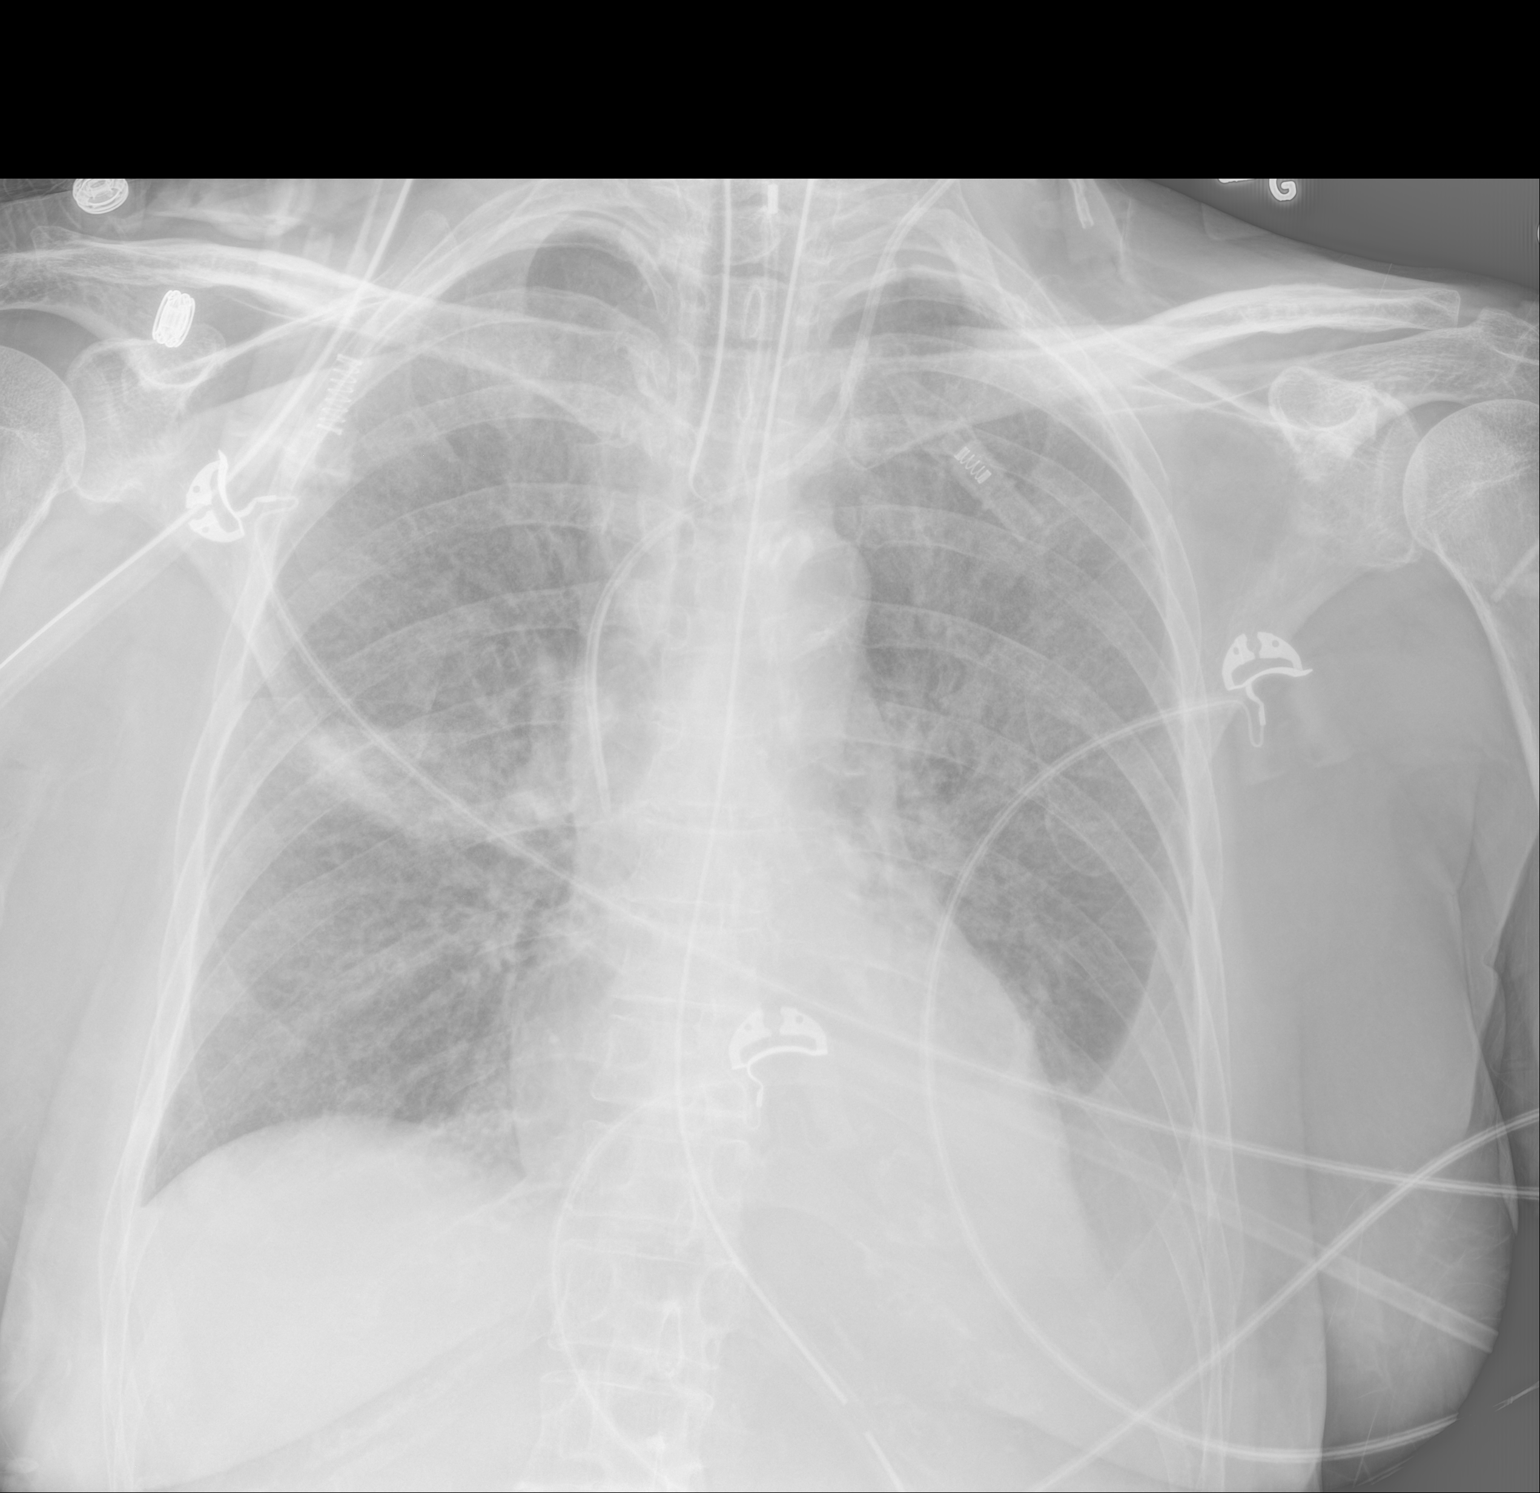

[1 of 1 positions shown; findings below may reference images not displayed]

FINDINGS: Endotracheal tube, left IJ line, NG tube in stable position.
Mediastinum hilar structures stable. Heart size stable. Persistent
diffuse bilateral airspace disease. Atelectatic changes in the right
mid lung. Persistent left pleural effusion. No pneumothorax. No
acute osseous abnormality.
IMPRESSION: 1. Lines and tubes in stable position.
2. Persistent bilateral airspace disease with prominent atelectatic
changes in the right mid lung. Persistent left pleural effusion.

## 2016-03-01 IMAGING — DX DG CHEST 1V PORT
1 series · 1 of 1 positions shown · non-contrast
Comparison: 06/11/2014.

CLINICAL DATA: ARDS.

EXAM:
PORTABLE CHEST - 1 VIEW

[chest ap]
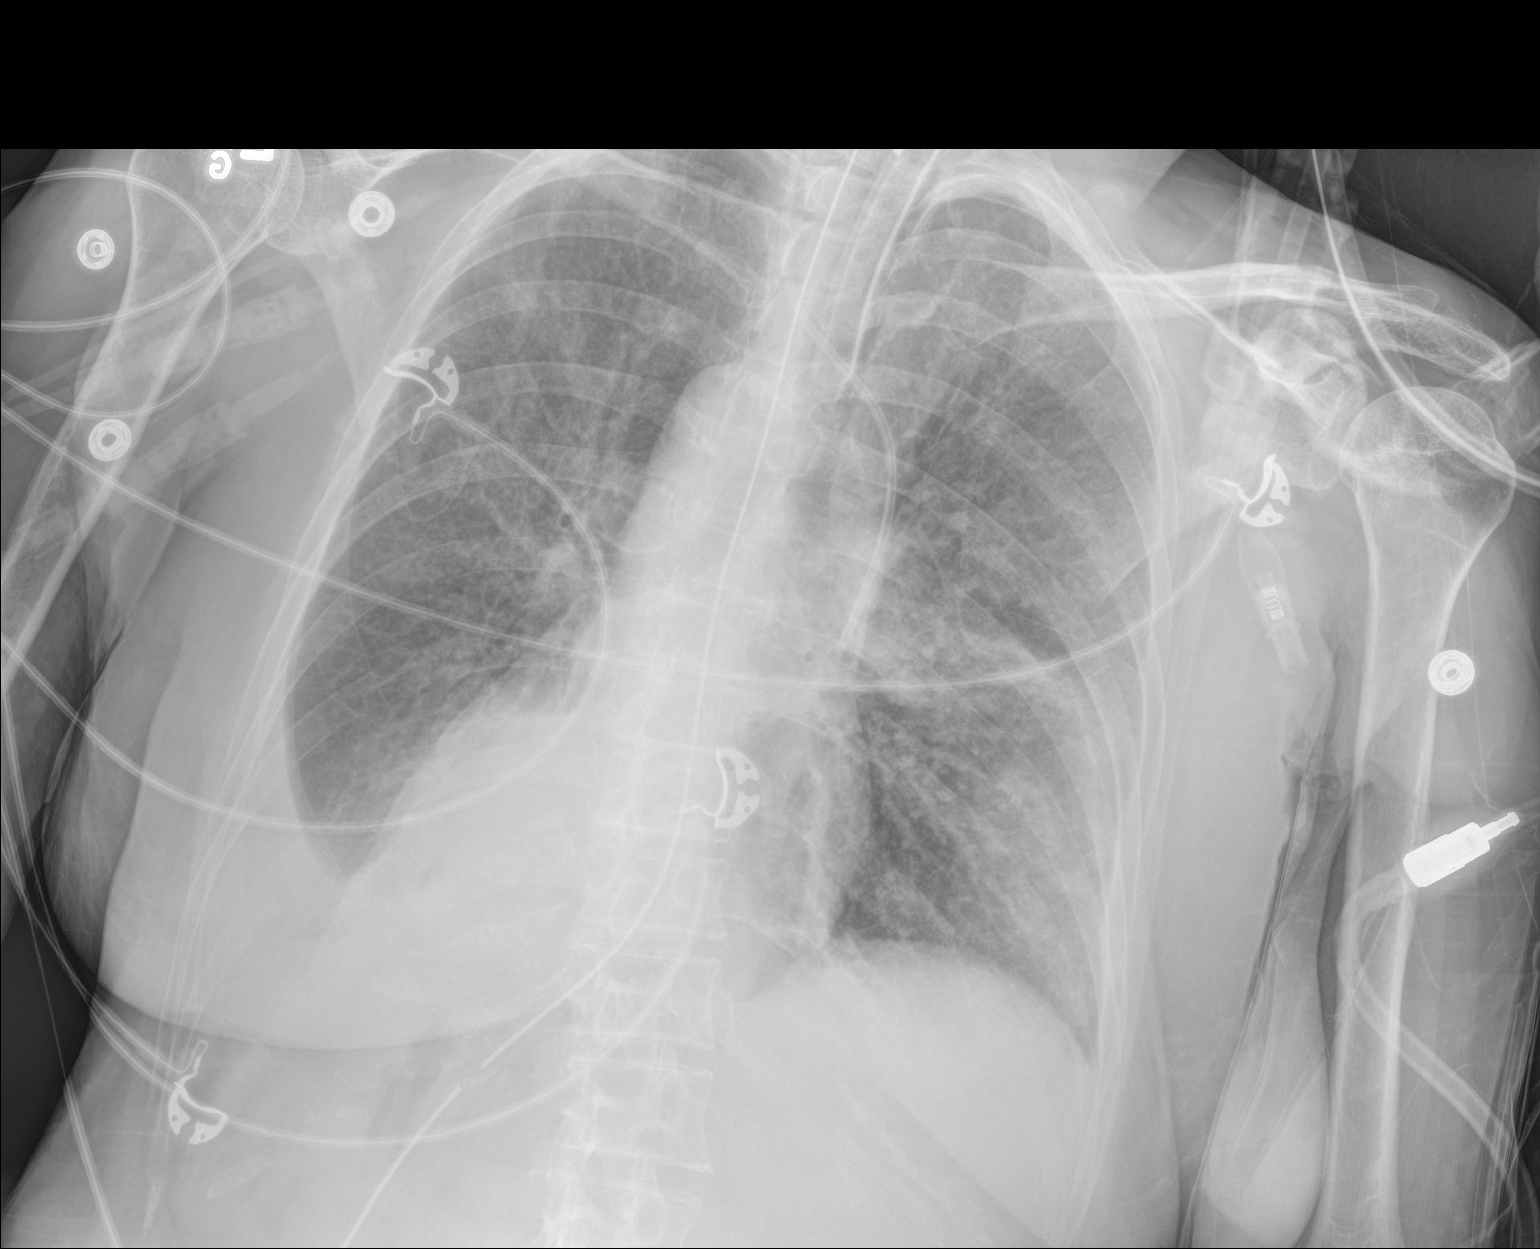

[1 of 1 positions shown; findings below may reference images not displayed]

FINDINGS: Image is rotated. Endotracheal tube, NG tube, and central line in
stable position. Heart size stable. No pulmonary venous congestion
persistent bilateral airspace disease with atelectatic changes in
the left mid lung. Persistent left pleural effusion. No
pneumothorax.
IMPRESSION: 1. Lines and tubes in stable position.
2. Image is rotated. Persistent bilateral airspace disease with
right mid lung field and left base atelectasis. Persistent left
pleural effusion. No interim change.

## 2016-03-04 IMAGING — DX DG CHEST 1V PORT
1 series · 1 of 1 positions shown · non-contrast
Comparison: 06/13/2014 and prior radiographs

CLINICAL DATA: 58-year-old female with respiratory failure.  ARDS

EXAM:
PORTABLE CHEST - 1 VIEW

[chest ap]
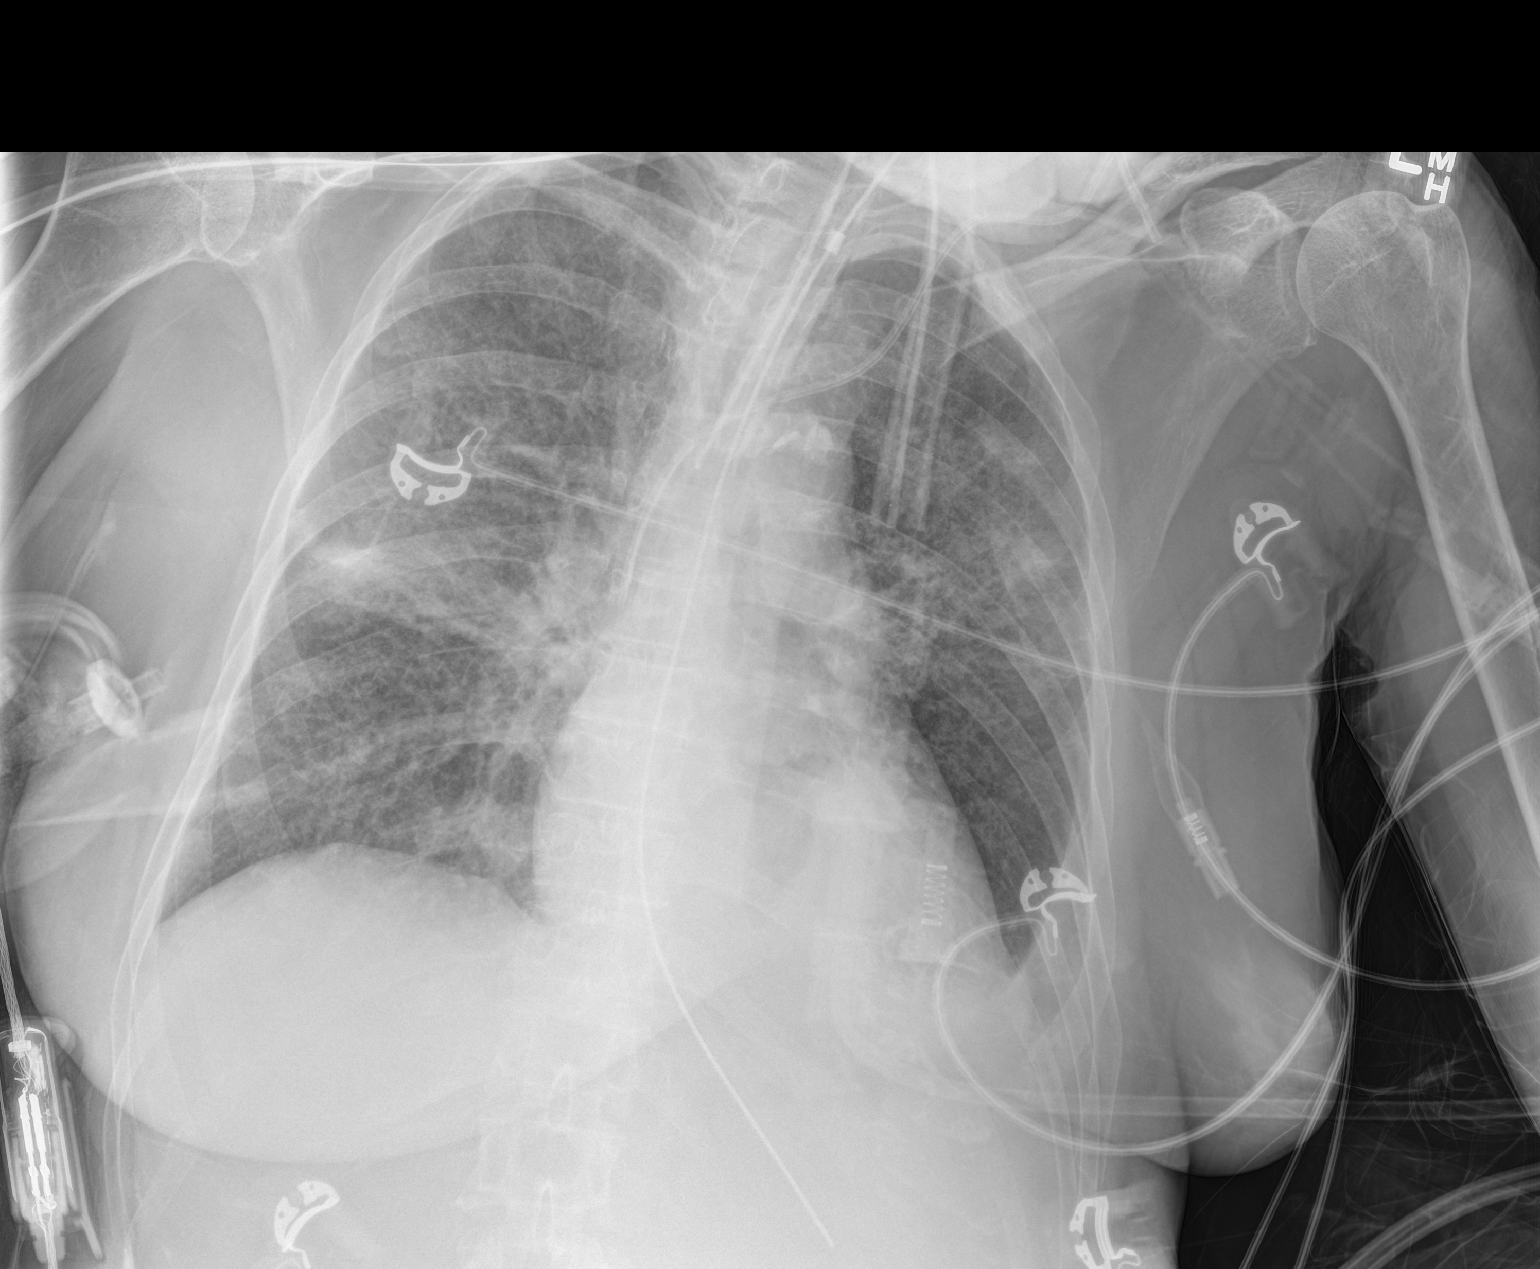

[1 of 1 positions shown; findings below may reference images not displayed]

FINDINGS: The cardiomediastinal silhouette is unremarkable.

An endotracheal tube with tip 2.5 cm above the carina, left IJ
central venous catheter with tip overlying the mid SVC, and NG tube
with tip overlying the proximal -mid stomach is again noted.

Bilateral airspace and interstitial opacities are again noted.

A small left pleural effusion and left basilar atelectasis/
consolidation is again identified.

There is no evidence of pneumothorax.
IMPRESSION: No significant changes. Support apparatus described with continued
bilateral airspace and interstitial opacities, small left pleural
effusion and left basilar atelectasis/ consolidation.

## 2022-03-30 ENCOUNTER — Emergency Department (HOSPITAL_COMMUNITY)
Admission: EM | Admit: 2022-03-30 | Discharge: 2022-03-31 | Disposition: A | Discharge status: Home / Self Care | Payer: Medicare HMO | Attending: Emergency Medicine | Admitting: Emergency Medicine

## 2022-03-30 ENCOUNTER — Emergency Department (HOSPITAL_COMMUNITY): Payer: Medicare HMO

## 2022-03-30 DIAGNOSIS — K7689 Other specified diseases of liver: Secondary | ICD-10-CM | POA: Insufficient documentation

## 2022-03-30 DIAGNOSIS — R1084 Generalized abdominal pain: Secondary | ICD-10-CM | POA: Diagnosis present

## 2022-03-30 DIAGNOSIS — Z7982 Long term (current) use of aspirin: Secondary | ICD-10-CM | POA: Diagnosis not present

## 2022-03-30 DIAGNOSIS — R748 Abnormal levels of other serum enzymes: Secondary | ICD-10-CM | POA: Diagnosis not present

## 2022-03-30 DIAGNOSIS — I2510 Atherosclerotic heart disease of native coronary artery without angina pectoris: Secondary | ICD-10-CM | POA: Insufficient documentation

## 2022-03-30 DIAGNOSIS — R7401 Elevation of levels of liver transaminase levels: Secondary | ICD-10-CM | POA: Diagnosis not present

## 2022-03-30 DIAGNOSIS — R109 Unspecified abdominal pain: Secondary | ICD-10-CM

## 2022-03-30 DIAGNOSIS — K769 Liver disease, unspecified: Secondary | ICD-10-CM

## 2022-03-30 LAB — COMPREHENSIVE METABOLIC PANEL
ALT: 26 U/L (ref 0–44)
AST: 51 U/L — ABNORMAL HIGH (ref 15–41)
Albumin: 3.7 g/dL (ref 3.5–5.0)
Alkaline Phosphatase: 383 U/L — ABNORMAL HIGH (ref 38–126)
Anion gap: 13 (ref 5–15)
BUN: 14 mg/dL (ref 8–23)
CO2: 19 mmol/L — ABNORMAL LOW (ref 22–32)
Calcium: 8.9 mg/dL (ref 8.9–10.3)
Chloride: 97 mmol/L — ABNORMAL LOW (ref 98–111)
Creatinine, Ser: 1.23 mg/dL — ABNORMAL HIGH (ref 0.44–1.00)
GFR, Estimated: 48 mL/min — ABNORMAL LOW (ref >60)
Glucose, Bld: 93 mg/dL (ref 70–99)
Potassium: 4 mmol/L (ref 3.5–5.1)
Sodium: 129 mmol/L — ABNORMAL LOW (ref 135–145)
Total Bilirubin: 0.3 mg/dL (ref 0.3–1.2)
Total Protein: 7.7 g/dL (ref 6.5–8.1)

## 2022-03-30 LAB — CBC WITH DIFFERENTIAL/PLATELET
Abs Immature Granulocytes: 0.05 10*3/uL (ref 0.00–0.07)
Basophils Absolute: 0.1 10*3/uL (ref 0.0–0.1)
Basophils Relative: 1 %
Eosinophils Absolute: 0.3 10*3/uL (ref 0.0–0.5)
Eosinophils Relative: 3 %
HCT: 40.9 % (ref 36.0–46.0)
Hemoglobin: 13 g/dL (ref 12.0–15.0)
Immature Granulocytes: 1 %
Lymphocytes Relative: 24 %
Lymphs Abs: 2.3 10*3/uL (ref 0.7–4.0)
MCH: 28.8 pg (ref 26.0–34.0)
MCHC: 31.8 g/dL (ref 30.0–36.0)
MCV: 90.5 fL (ref 80.0–100.0)
Monocytes Absolute: 0.7 10*3/uL (ref 0.1–1.0)
Monocytes Relative: 8 %
Neutro Abs: 6.2 10*3/uL (ref 1.7–7.7)
Neutrophils Relative %: 63 %
Platelets: 280 10*3/uL (ref 150–400)
RBC: 4.52 MIL/uL (ref 3.87–5.11)
RDW: 12.5 % (ref 11.5–15.5)
WBC: 9.6 10*3/uL (ref 4.0–10.5)
nRBC: 0 % (ref 0.0–0.2)

## 2022-03-30 LAB — LIPASE, BLOOD: Lipase: 55 U/L — ABNORMAL HIGH (ref 11–51)

## 2022-03-30 LAB — URINALYSIS, ROUTINE W REFLEX MICROSCOPIC
Bacteria, UA: NONE SEEN
Bilirubin Urine: NEGATIVE
Glucose, UA: NEGATIVE mg/dL
Ketones, ur: NEGATIVE mg/dL
Leukocytes,Ua: NEGATIVE
Nitrite: NEGATIVE
Protein, ur: 30 mg/dL — AB
Specific Gravity, Urine: 1.017 (ref 1.005–1.030)
pH: 6 (ref 5.0–8.0)

## 2022-03-30 MED ORDER — IOHEXOL 300 MG/ML  SOLN
80.0000 mL | Freq: Once | INTRAMUSCULAR | Status: AC | PRN
  Administered 2022-03-30: 80 mL via INTRAVENOUS

## 2022-03-30 NOTE — ED Triage Notes (Signed)
  Patient has been having abdominal pain x 3 weeks, seen on 1/16th by MD and diagnosed with Reflux and was given meds. States pain is still there, constant ache, worse when she eats. Hx of Chrons, has ileostomy for several years.

## 2022-03-30 NOTE — ED Provider Triage Note (Signed)
Emergency Medicine Provider Triage Evaluation Note  [name] , a 67 y.o. female  was evaluated in triage.  Pt complains of abdominal pain in the last 2 weeks.  Pain is located in the upper abdomen, intermittent, dull.  Patient has had difficulty keeping food down.  Denies vomiting, fever, bowel changes or urinary symptoms.  History of Crohn's disease, has ileostomy bag.  Review of Systems  Positive: As above Negative: As above  Physical Exam  BP 107/64 (BP Location: Right Arm)   Pulse 82   Temp 98.1 F (36.7 C) (Oral)   Resp 16   Ht '4\' 11"'$  (1.499 m)   Wt 45.4 kg   SpO2 100%   BMI 20.20 kg/m  Gen:   Awake, no distress   Resp:  Normal effort  MSK:   Moves extremities without difficulty  Other:  TTP to epigastrium  Medical Decision Making  Medically screening exam initiated at 2:42 PM.  Appropriate orders placed.  Bevan Vu Collard was informed that the remainder of the evaluation will be completed by another provider, this initial triage assessment does not replace that evaluation, and the importance of remaining in the ED until their evaluation is complete.    Rex Kras, [place] 03/30/22 1444

## 2022-03-30 NOTE — ED Triage Notes (Signed)
Patient has been having abdominal pain x 3 weeks, seen on 1/16th by MD and diagnosed with Reflux and was given meds. States pain is still there, constant ache, worse when she eats. Hx of Chrons, has ostomy for several years.

## 2022-03-31 ENCOUNTER — Telehealth (HOSPITAL_COMMUNITY): Payer: Self-pay | Admitting: Emergency Medicine

## 2022-03-31 MED ORDER — OXYCODONE-ACETAMINOPHEN 5-325 MG PO TABS
1.0000 | ORAL_TABLET | Freq: Once | ORAL | Status: AC
  Administered 2022-03-31: 1 via ORAL
  Filled 2022-03-31: qty 1

## 2022-03-31 MED ORDER — OXYCODONE HCL 5 MG PO TABS: 5.0000 mg | ORAL_TABLET | ORAL | 0 refills | Status: DC | PRN

## 2022-03-31 NOTE — Discharge Instructions (Addendum)
Your CT scan shows some spots on your liver and also in your spine.  This is concerning for cancer that may have spread.  Please follow-up with the cancer center for further evaluation.  Also, follow-up with the gastroenterologist.  The CT scan suggested that there may be a lesion in the stomach that they could see with an endoscopy.  You may take ibuprofen and/or acetaminophen as needed for pain.  For pain not relieved with the combination of ibuprofen and acetaminophen, you may take oxycodone.  All of these medicines work on pain in different ways, and work very well together.  Eat small amounts frequently throughout the day.  Please try to maintain your hydration.

## 2022-03-31 NOTE — Telephone Encounter (Signed)
Patient called.  Medicine sent to pharmacy she does not use.  Changed to [facility] on [address].  Reviewed drug database.

## 2022-03-31 NOTE — ED Provider Notes (Signed)
EMERGENCY DEPARTMENT AT [facility] Provider Note   CSN: [id number] Arrival date & time: 03/30/22  1346     History  No chief complaint on file.   [name] is a 67 y.o. female.  The history is provided by the patient and a relative.  She has history of coronary artery disease, Crohn's disease status post total colectomy and comes in with 2-3 weeks of generalized abdominal pain.  Pain is constant.  Nothing makes it better, nothing makes it worse.  There has been associated early satiety and generalized anorexia.  She denies fever or chills and denies nausea or vomiting.  She denies any weight loss.  She does have a nurse practitioner who comes to the house and prescribed omeprazole, which has not helped.   Home Medications Prior to Admission medications   Medication Sig Start Date End Date Taking? Authorizing Provider  oxyCODONE (ROXICODONE) 5 MG immediate release tablet Take 1 tablet (5 mg total) by mouth every 4 (four) hours as needed for severe pain. 7/85/88  Yes Delora Fuel, MD  acetaminophen (TYLENOL) 325 MG tablet Take 2 tablets (650 mg total) by mouth every 6 (six) hours as needed for fever, headache, mild pain or moderate pain. 06/23/14   Earnstine Regal, PA-C  aspirin 81 MG tablet Take 81 mg by mouth daily.      [provider]  diazepam (VALIUM) 5 MG tablet Take 1 tablet (5 mg total) by mouth every 12 (twelve) hours as needed for anxiety. 09/16/14   Lafayette Dragon, MD  dicyclomine (BENTYL) 20 MG tablet Take 1 tablet by mouth twice daily as needed for crampy abdominal pain 09/04/13   Lafayette Dragon, MD  ergocalciferol (VITAMIN D2) 50000 UNITS capsule Take one po weekly x 12 weeks Patient not taking: Reported on 09/16/2014 05/22/13   Lafayette Dragon, MD  famotidine (PEPCID) 20 MG tablet You can buy this at any drug store or the generic equivalent for use at home.  Discuss with Dr. Olevia Perches on return visist, Patient not taking: Reported on 09/16/2014  06/23/14   Earnstine Regal, PA-C  folic acid (FOLVITE) 1 MG tablet Take 1 tablet (1 mg total) by mouth daily. Patient not taking: Reported on 09/16/2014 01/27/14   Lafayette Dragon, MD  Mesalamine (ASACOL) 400 MG CPDR DR capsule Take 2 capsules (800 mg total) by mouth 3 (three) times daily. Patient not taking: Reported on 09/16/2014 07/17/14   Lafayette Dragon, MD  Multiple Vitamin (MULTIVITAMIN PO) Take 1 tablet by mouth daily.     [provider]      Allergies    Remicade [infliximab]    Review of Systems   Review of Systems  All other systems reviewed and are negative.   Physical Exam Updated Vital Signs BP (!) 106/54 (BP Location: Left Arm)   Pulse 85   Temp 97.7 F (36.5 C) (Oral)   Resp 16   Ht '4\' 11"'$  (1.499 m)   Wt 45.4 kg   SpO2 99%   BMI 20.20 kg/m  Physical Exam Vitals and nursing note reviewed.   67 year old female, resting comfortably and in no acute distress. Vital signs are normal. Oxygen saturation is 99%, which is normal. Head is normocephalic and atraumatic. PERRLA, EOMI. Oropharynx is clear. Neck is nontender and supple without adenopathy or JVD. Back is nontender and there is no CVA tenderness. Lungs are clear without rales, wheezes, or rhonchi. Chest is nontender. Heart has regular rate  and rhythm without murmur. Abdomen is soft, flat, with mild tenderness diffusely.  There is no rebound or guarding.  Ileostomy is present in the right lower quadrant. Extremities have no cyanosis or edema, full range of motion is present. Skin is warm and dry without rash. Neurologic: Mental status is normal, cranial nerves are intact, moves all extremities equally.  ED Results / Procedures / Treatments   Labs (all labs ordered are listed, but only abnormal results are displayed) Labs Reviewed  COMPREHENSIVE METABOLIC PANEL - Abnormal; Notable for the following components:      Result Value   Sodium 129 (*)    Chloride 97 (*)    CO2 19 (*)    Creatinine, Ser  1.23 (*)    AST 51 (*)    Alkaline Phosphatase 383 (*)    GFR, Estimated 48 (*)    All other components within normal limits  LIPASE, BLOOD - Abnormal; Notable for the following components:   Lipase 55 (*)    All other components within normal limits  URINALYSIS, ROUTINE W REFLEX MICROSCOPIC - Abnormal; Notable for the following components:   Hgb urine dipstick SMALL (*)    Protein, ur 30 (*)    All other components within normal limits  CBC WITH DIFFERENTIAL/PLATELET   Radiology CT ABDOMEN PELVIS W CONTRAST  Result Date: 03/30/2022 CLINICAL DATA:  Epigastric pain, Crohn's disease EXAM: CT ABDOMEN AND PELVIS WITH CONTRAST TECHNIQUE: Multidetector CT imaging of the abdomen and pelvis was performed using the standard protocol following bolus administration of intravenous contrast. RADIATION DOSE REDUCTION: This exam was performed according to the departmental dose-optimization program which includes automated exposure control, adjustment of the mA and/or kV according to patient size and/or use of iterative reconstruction technique. CONTRAST:  80 mL OMNIPAQUE IOHEXOL 300 MG/ML  SOLN COMPARISON:  06/17/2013 FINDINGS: Lower chest: No acute abnormality. Hepatobiliary: Innumerable hepatic hypodense lesions are new compared to the prior study and most likely a neoplastic process. The largest lesion is in the right lobe centrally measuring 4.5 cm. No biliary ductal dilatation identified. Patient is status post cholecystectomy. Pancreas: Unremarkable. No pancreatic ductal dilatation or surrounding inflammatory changes. Spleen: Normal in size without focal abnormality. Adrenals/Urinary Tract: Adrenal glands are unremarkable. Kidneys are normal, without renal calculi, focal lesion, or hydronephrosis. Bladder is unremarkable. Stomach/Bowel: There is thickening of the gastric mucosa in the cardia region. Endoscopic evaluation recommended for possible mass. These findings could also be attributed to patient's  history of Crohn's disease. There is a right lower quadrant ostomy. No dilated bowel is seen to suggest obstruction. Presacral fluid collection identified measuring 4.2 cm. Possible bowel wall thickening noted in the pelvis. Evaluation for bowel pathology was limited by lack of contrast. There is an anastomosis in the right hemipelvis. Vascular/Lymphatic: Aortic atherosclerosis. No enlarged abdominal or pelvic lymph nodes. Reproductive: Status post hysterectomy. No adnexal masses. Other: Small amount fluid in the cul-de-sac. Right lower quadrant ostomy. Musculoskeletal: Innumerable osteoblastic lesions identified in the lumbar spine and pelvis which are new compared to the prior study consistent with metastatic disease. There is also a small osteoblastic lesion in the left femoral neck. IMPRESSION: 1. New hepatic lesions concerning for neoplasm. 2. New osteoblastic lesions concerning for neoplasm. 3. Abnormal appearance of the gastric cardia and GE junction with possible mass versus inflammation related to patient's history of Crohn's disease. This could represent the primary neoplasm. Endoscopic correlation is recommended. 4. Fluid collection in the presacral region which may be related to abnormally thickened bowel loops.  An abscess is not excluded. It should be noted that evaluation for bowel pathology was limited by the lack GI contrast. Electronically Signed   By: Sammie Bench M.D.   On: 03/30/2022 20:43    Procedures Procedures    Medications Ordered in ED Medications  iohexol (OMNIPAQUE) 300 MG/ML solution 80 mL (80 mLs Intravenous Contrast Given 03/30/22 2018)  oxyCODONE-acetaminophen (PERCOCET/ROXICET) 5-325 MG per tablet 1 tablet (1 tablet Oral Given 03/31/22 4235)    ED Course/ Medical Decision Making/ A&P                             Medical Decision Making Risk Prescription drug management.   Abdominal pain with anorexia and early satiety the patient with history of Crohn's disease  and extensive abdominal surgery.  Consider peptic ulcer disease, GERD, occult malignancy, occult infection.  I have reviewed and interpreted her laboratory test, and my interpretation is mild hyponatremia which is probably not clinically significant, mild renal insufficiency which is significantly worse than last lab value in our system in 2016, but not significantly changed from 07/14/2020 drawn at [facility], mild elevation of lipase and AST of uncertain clinical significance, normal CBC, normal urinalysis except for mild proteinuria.  CT of abdomen and pelvis shows multiple hepatic lesions concerning for metastatic disease, also multiple osteoblastic lesions concerning for metastatic disease.  These are all new relative to CT of abdomen and pelvis on 06/17/2013.  Also abnormal appearance of gastric cardia and gastroesophageal junction with possible mass which could represent primary neoplasm versus sequelae of Crohn's disease, presacral fluid collection which is likely related to abnormally thickened bowel loops.  I have independently viewed the images, and agree with the radiologist interpretation.  The combination of osteoblastic bone lesions and elevated alkaline phosphatase strongly suggests metastatic disease.  I had an extensive discussion with the patient and her family member regarding likely diagnosis but the need for definitive tissue to guide future therapy.  I have ordered an ambulatory referral to oncology, I am also giving her a referral to gastroenterology.  I have sent a prescription for oxycodone which she can use for pain control at home.  Final Clinical Impression(s) / ED Diagnoses Final diagnoses:  Abdominal pain, unspecified abdominal location  Liver lesion  Elevated alkaline phosphatase level  Elevated lipase  Elevated AST (SGOT)    Rx / DC Orders ED Discharge Orders          Ordered    Ambulatory referral to Hematology / Oncology       Comments: Your emergency department  provider has referred you to see a hematology/oncology specialist. These are physicians who specialize in blood disorders and cancers, or findings concerning for cancer. You will receive a phone call from the [facility] to set up your appointment within 2 business days: [employer] operate Mon - Fri, 8:00 a.m. to 5:00 p.m.; closed for federally recognized holidays. Please be sure your phone is not set to block numbers during this time. You may call [phone] at any point during [facility] hours of operation to make an appointment as well.   03/31/22 0409    oxyCODONE (ROXICODONE) 5 MG immediate release tablet  Every 4 hours PRN        03/31/22 0867              Delora Fuel, MD 61/95/09 0423

## 2022-04-04 ENCOUNTER — Other Ambulatory Visit: Payer: Self-pay

## 2022-04-04 ENCOUNTER — Inpatient Hospital Stay: Payer: Medicare HMO

## 2022-04-04 ENCOUNTER — Encounter: Payer: Self-pay | Admitting: Physician Assistant

## 2022-04-04 ENCOUNTER — Inpatient Hospital Stay: Payer: Medicare HMO | Attending: Physician Assistant | Admitting: Physician Assistant

## 2022-04-04 VITALS — BP 105/53 | HR 83 | Temp 97.5°F | Resp 18 | Wt 95.8 lb

## 2022-04-04 VITALS — BP 120/58 | HR 81 | Resp 16

## 2022-04-04 DIAGNOSIS — D371 Neoplasm of uncertain behavior of stomach: Secondary | ICD-10-CM | POA: Diagnosis not present

## 2022-04-04 DIAGNOSIS — R634 Abnormal weight loss: Secondary | ICD-10-CM | POA: Diagnosis not present

## 2022-04-04 DIAGNOSIS — Z9049 Acquired absence of other specified parts of digestive tract: Secondary | ICD-10-CM | POA: Insufficient documentation

## 2022-04-04 DIAGNOSIS — M899 Disorder of bone, unspecified: Secondary | ICD-10-CM | POA: Insufficient documentation

## 2022-04-04 DIAGNOSIS — D376 Neoplasm of uncertain behavior of liver, gallbladder and bile ducts: Secondary | ICD-10-CM | POA: Diagnosis not present

## 2022-04-04 DIAGNOSIS — F17210 Nicotine dependence, cigarettes, uncomplicated: Secondary | ICD-10-CM | POA: Insufficient documentation

## 2022-04-04 DIAGNOSIS — K3189 Other diseases of stomach and duodenum: Secondary | ICD-10-CM | POA: Diagnosis not present

## 2022-04-04 DIAGNOSIS — I959 Hypotension, unspecified: Secondary | ICD-10-CM

## 2022-04-04 DIAGNOSIS — K5090 Crohn's disease, unspecified, without complications: Secondary | ICD-10-CM | POA: Diagnosis not present

## 2022-04-04 DIAGNOSIS — K769 Liver disease, unspecified: Secondary | ICD-10-CM | POA: Diagnosis present

## 2022-04-04 DIAGNOSIS — R6881 Early satiety: Secondary | ICD-10-CM

## 2022-04-04 DIAGNOSIS — R5383 Other fatigue: Secondary | ICD-10-CM | POA: Diagnosis not present

## 2022-04-04 DIAGNOSIS — C787 Secondary malignant neoplasm of liver and intrahepatic bile duct: Secondary | ICD-10-CM

## 2022-04-04 LAB — CMP (CANCER CENTER ONLY)
ALT: 21 U/L (ref 0–44)
AST: 43 U/L — ABNORMAL HIGH (ref 15–41)
Albumin: 3.7 g/dL (ref 3.5–5.0)
Alkaline Phosphatase: 492 U/L — ABNORMAL HIGH (ref 38–126)
Anion gap: 9 (ref 5–15)
BUN: 18 mg/dL (ref 8–23)
CO2: 19 mmol/L — ABNORMAL LOW (ref 22–32)
Calcium: 9.1 mg/dL (ref 8.9–10.3)
Chloride: 98 mmol/L (ref 98–111)
Creatinine: 1.65 mg/dL — ABNORMAL HIGH (ref 0.44–1.00)
GFR, Estimated: 34 mL/min — ABNORMAL LOW (ref >60)
Glucose, Bld: 104 mg/dL — ABNORMAL HIGH (ref 70–99)
Potassium: 4.7 mmol/L (ref 3.5–5.1)
Sodium: 126 mmol/L — ABNORMAL LOW (ref 135–145)
Total Bilirubin: 0.4 mg/dL (ref 0.3–1.2)
Total Protein: 7.4 g/dL (ref 6.5–8.1)

## 2022-04-04 LAB — CBC WITH DIFFERENTIAL (CANCER CENTER ONLY)
Abs Immature Granulocytes: 0.05 10*3/uL (ref 0.00–0.07)
Basophils Absolute: 0.1 10*3/uL (ref 0.0–0.1)
Basophils Relative: 1 %
Eosinophils Absolute: 0.2 10*3/uL (ref 0.0–0.5)
Eosinophils Relative: 2 %
HCT: 37.8 % (ref 36.0–46.0)
Hemoglobin: 12.9 g/dL (ref 12.0–15.0)
Immature Granulocytes: 1 %
Lymphocytes Relative: 14 %
Lymphs Abs: 1.2 10*3/uL (ref 0.7–4.0)
MCH: 28.9 pg (ref 26.0–34.0)
MCHC: 34.1 g/dL (ref 30.0–36.0)
MCV: 84.8 fL (ref 80.0–100.0)
Monocytes Absolute: 0.6 10*3/uL (ref 0.1–1.0)
Monocytes Relative: 7 %
Neutro Abs: 6.6 10*3/uL (ref 1.7–7.7)
Neutrophils Relative %: 75 %
Platelet Count: 269 10*3/uL (ref 150–400)
RBC: 4.46 MIL/uL (ref 3.87–5.11)
RDW: 12.6 % (ref 11.5–15.5)
WBC Count: 8.7 10*3/uL (ref 4.0–10.5)
nRBC: 0 % (ref 0.0–0.2)

## 2022-04-04 LAB — CEA (ACCESS): CEA (CHCC): 6205.22 ng/mL — ABNORMAL HIGH (ref 0.00–5.00)

## 2022-04-04 MED ORDER — SODIUM CHLORIDE 0.9 % IV SOLN: Freq: Once | INTRAVENOUS | Status: AC

## 2022-04-04 MED ORDER — ONDANSETRON 8 MG PO TBDP: 8.0000 mg | ORAL_TABLET | Freq: Three times a day (TID) | ORAL | 0 refills | Status: DC | PRN

## 2022-04-04 MED ORDER — OXYCODONE HCL 5 MG PO TABS: 5.0000 mg | ORAL_TABLET | ORAL | 0 refills | Status: DC | PRN

## 2022-04-04 NOTE — Progress Notes (Signed)
Referral,  demographics, insurance information, and ov note faxed to [facility] for urgent consultation.

## 2022-04-04 NOTE — Progress Notes (Signed)
PROCEDURE / BIOPSY REVIEW Date: 04/04/22  Requested Biopsy site: Liver Reason for request: Mass Imaging review: I reviewed all pertinent diagnostic studies, including; CT AP, 03/30/22  Decision: Approved Recommended imaging modality to perform biopsy: Ultrasound Schedule with: Moderate Sedation Schedule for: Any VIR  Additional comments:  '@VIR'$ : Multifocal liver masses, '@Schedulers'$ . ASA hold. Coags on day of procedure.  Please contact me with questions, concerns, or if issue pertaining to this request arise.  Michaelle Birks, MD [facility]   Pager. [place]

## 2022-04-04 NOTE — Patient Instructions (Signed)

## 2022-04-04 NOTE — Progress Notes (Signed)
[place] Telephone:([phone]) [phone]   Fax:([phone]) [phone]  INITIAL CONSULTATION:  Patient Care Team: System, Provider Not In as PCP - General  CHIEF COMPLAINTS/PURPOSE OF CONSULTATION:  Abnormal CT scan concerning for malignancy   HISTORY OF PRESENTING ILLNESS:  [name] 67 y.o. female with medical history significant for Crohn's disease s/p total colectomy presents to the diagnostic clinic for evaluation of abnormal CT scan concerning for malignancy.   On review of the previous records, [name] presented to emergency room on 03/31/2022 with generalized abdominal pain. CT abdomen and pelvis was obtained that showed new hepatic lesions, new osteoblastic lesions and abnormal appearance of gastric cardia and GE junction. There was evidence of fluid collection in the presacral region which may be related to abnormally thickened bowel loops.   On exam today, [name] reports persistent epigastric pain that has improved with oxycodone 5 mg that she takes every 4-6 hours. She rates the pain now as 4-6 out of a 10 on a pain scale. She has post prandial nausea and early satiety. She has lost approximately 5 lbs over the last few months. She was recently prescribed carafate but unsure if there is any improvement at this time. She has stable output from her ostomy without any changes in stool consistency. She denies easy bruising or signs of bleeding including hematochezia or melena. She reports progressive fatigue over the last 3-4 weeks. She is able to complete her basic ADLs like feed her dogs but is in bed approximately 50% of the day. She reports history of dizziness that is well controlled meclizine. She denies fevers, chills, sweats, shortness of breath, chest pain or cough. She has no other complaints. Rest of the 10 point ROS is below.   MEDICAL HISTORY:  Past Medical History:  Diagnosis Date   Anal fistula    Arthritis    Coronary atherosclerosis  of unspecified type of vessel, native or graft    Crohn's colitis ([place])    Heart murmur    History of colostomy    Hx of myocardial infarction 1986   Personal history of colonic polyps     SURGICAL HISTORY: Past Surgical History:  Procedure Laterality Date   [place] she thinks   COLOSTOMY  2012   IRRIGATION AND DEBRIDEMENT ABSCESS N/A 06/18/2013   Procedure: Irrigation and Debridiment of peristomal abcess;  Surgeon: Gayland Curry, MD;  Location: [facility];  Service: General;  Laterality: N/A;   LAPAROTOMY N/A 06/02/2014   Procedure: EXPLORATORY LAPAROTOMY/PARTIAL COLECTOMY COLOSTOMY;  Surgeon: Excell Seltzer, MD;  Location: [facility];  Service: General;  Laterality: N/A;   TUBAL LIGATION  1993    SOCIAL HISTORY: Social History   Socioeconomic History   Marital status: Divorced    Spouse name: Not on file   Number of children: 1   Years of education: Not on file   Highest education level: Not on file  Occupational History   Occupation: disabled    Employer: DISABLED  Tobacco Use   Smoking status: Every Day    Packs/day: 0.50    Years: 50.00    Total pack years: 25.00    Types: Cigarettes   Smokeless tobacco: Never  Substance and Sexual Activity   Alcohol use: Yes    Comment: sparingly, for holidays   Drug use: No   Sexual activity:  Not on file  Other Topics Concern   Not on file  Social History Narrative   One daughter age 15- Has PTSD, lives with patient   Divorced   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   Completed some college   Enjoys cleaning, spending time with family   Social Determinants of Health   Financial Resource Strain: Not on file  Food Insecurity: No Food Insecurity (04/04/2022)   Hunger Vital Sign    Worried About Running Out of Food in the Last Year: Never true    Ran Out of Food in the Last Year: Never true  Transportation Needs: No Transportation Needs  (04/04/2022)   PRAPARE - Hydrologist (Medical): No    Lack of Transportation (Non-Medical): No  Physical Activity: Not on file  Stress: Not on file  Social Connections: Not on file  Intimate Partner Violence: Not At Risk (04/04/2022)   Humiliation, Afraid, Rape, and Kick questionnaire    Fear of Current or Ex-Partner: No    Emotionally Abused: No    Physically Abused: No    Sexually Abused: No    FAMILY HISTORY: Family History  Problem Relation Age of Onset   Heart disease Mother    Stroke Father    Heart disease Brother    Prostate cancer Brother    Lung cancer Paternal Uncle    Colon polyps Neg Hx    Esophageal cancer Neg Hx    Rectal cancer Neg Hx    Stomach cancer Neg Hx     ALLERGIES:  is allergic to remicade [infliximab].  MEDICATIONS:  Current Outpatient Medications  Medication Sig Dispense Refill   acetaminophen (TYLENOL) 325 MG tablet Take 2 tablets (650 mg total) by mouth every 6 (six) hours as needed for fever, headache, mild pain or moderate pain.     aspirin 81 MG tablet Take 81 mg by mouth daily.       famotidine (PEPCID) 20 MG tablet You can buy this at any drug store or the generic equivalent for use at home.  Discuss with Dr. Olevia Perches on return visist,     meclizine (ANTIVERT) 25 MG tablet Take 1 tablet by mouth 3 (three) times daily as needed.     omeprazole (PRILOSEC) 40 MG capsule Take 1 capsule every day by oral route for 60 days.     oxyCODONE (ROXICODONE) 5 MG immediate release tablet Take 1 tablet (5 mg total) by mouth every 4 (four) hours as needed for severe pain. 30 tablet 0   sucralfate (CARAFATE) 1 GM/10ML suspension Take 1 g by mouth 4 (four) times daily.     diazepam (VALIUM) 5 MG tablet Take 1 tablet (5 mg total) by mouth every 12 (twelve) hours as needed for anxiety. (Patient not taking: Reported on 04/04/2022) 40 tablet 1   dicyclomine (BENTYL) 20 MG tablet Take 1 tablet by mouth twice daily as needed for crampy  abdominal pain (Patient not taking: Reported on 04/04/2022) 60 tablet 5   ergocalciferol (VITAMIN D2) 50000 UNITS capsule Take one po weekly x 12 weeks 4 capsule 3   folic acid (FOLVITE) 1 MG tablet Take 1 tablet (1 mg total) by mouth daily. 100 tablet 1   Mesalamine (ASACOL) 400 MG CPDR DR capsule Take 2 capsules (800 mg total) by mouth 3 (three) times daily. (Patient not taking: Reported on 09/16/2014) 180 capsule 0   Multiple Vitamin (MULTIVITAMIN PO) Take 1 tablet by mouth daily.  (Patient not  taking: Reported on 04/04/2022)     Current Facility-Administered Medications  Medication Dose Route Frequency Provider Last Rate Last Admin   pneumococcal 23 valent vaccine (PNU-IMMUNE) injection 0.5 mL  0.5 mL Intramuscular Once [place] Dragon, MD        REVIEW OF SYSTEMS:   Constitutional: ( - ) fevers, ( - )  chills , ( - ) night sweats Eyes: ( - ) blurriness of vision, ( - ) double vision, ( - ) watery eyes Ears, nose, mouth, throat, and face: ( - ) mucositis, ( - ) sore throat Respiratory: ( - ) cough, ( - ) dyspnea, ( - ) wheezes Cardiovascular: ( - ) palpitation, ( - ) chest discomfort, ( - ) lower extremity swelling Gastrointestinal:  ( - ) nausea, ( - ) heartburn, ( - ) change in bowel habits Skin: ( - ) abnormal skin rashes Lymphatics: ( - ) new lymphadenopathy, ( - ) easy bruising Neurological: ( - ) numbness, ( - ) tingling, ( - ) new weaknesses Behavioral/Psych: ( - ) mood change, ( - ) new changes  All other systems were reviewed with the patient and are negative.  PHYSICAL EXAMINATION: ECOG PERFORMANCE STATUS: 1 - Symptomatic but completely ambulatory  Vitals:   04/04/22 1005 04/04/22 1058  BP: (!) 80/51 (!) 105/53  Pulse: 83   Resp: 18   Temp: (!) 97.5 F (36.4 C)   SpO2: 100%    Filed Weights   04/04/22 1005  Weight: 95 lb 12.8 oz (43.5 kg)    GENERAL: female in NAD  SKIN: skin color, texture, turgor are normal, no rashes or significant lesions EYES: conjunctiva are  pink and non-injected, sclera clear OROPHARYNX: no exudate, no erythema; lips, buccal mucosa, and tongue normal  NECK: supple, non-tender LYMPH:  no palpable lymphadenopathy in the cervical, axillary or supraclavicular lymph nodes.  LUNGS: clear to auscultation and percussion with normal breathing effort HEART: regular rate & rhythm and no murmurs and no lower extremity edema ABDOMEN: soft, non-tender, non-distended, normal bowel sounds. Ostomy in place without surrounding erythema.  Musculoskeletal: no cyanosis of digits and no clubbing  PSYCH: alert & oriented x 3, fluent speech NEURO: no focal motor/sensory deficits  LABORATORY DATA:  I have reviewed the data as listed    Latest Ref Rng & Units 03/30/2022    3:21 PM 09/16/2014    1:59 PM 07/17/2014    4:37 PM  CBC  WBC 4.0 - 10.5 K/uL 9.6  9.9  13.9   Hemoglobin 12.0 - 15.0 g/dL 13.0  13.0  12.8   Hematocrit 36.0 - 46.0 % 40.9  39.5  39.0   Platelets 150 - 400 K/uL 280  392.0  575.0        Latest Ref Rng & Units 03/30/2022    3:21 PM 07/17/2014    4:37 PM 06/21/2014    4:40 AM  CMP  Glucose 70 - 99 mg/dL 93  131  98   BUN 8 - 23 mg/dL '14  7  16   '$ Creatinine 0.44 - 1.00 mg/dL 1.23  0.67  0.62   Sodium 135 - 145 mmol/L 129  133  130   Potassium 3.5 - 5.1 mmol/L 4.0  4.8  3.8   Chloride 98 - 111 mmol/L 97  97  97   CO2 22 - 32 mmol/L '19  30  23   '$ Calcium 8.9 - 10.3 mg/dL 8.9  10.2  8.7   Total Protein 6.5 - 8.1  g/dL 7.7  8.7    Total Bilirubin 0.3 - 1.2 mg/dL 0.3  0.3    Alkaline Phos 38 - 126 U/L 383  77    AST 15 - 41 U/L 51  17    ALT 0 - 44 U/L 26  9       RADIOGRAPHIC STUDIES: I have personally reviewed the radiological images as listed and agreed with the findings in the report. CT ABDOMEN PELVIS W CONTRAST  Result Date: 03/30/2022 CLINICAL DATA:  Epigastric pain, Crohn's disease EXAM: CT ABDOMEN AND PELVIS WITH CONTRAST TECHNIQUE: Multidetector CT imaging of the abdomen and pelvis was performed using the standard  protocol following bolus administration of intravenous contrast. RADIATION DOSE REDUCTION: This exam was performed according to the departmental dose-optimization program which includes automated exposure control, adjustment of the mA and/or kV according to patient size and/or use of iterative reconstruction technique. CONTRAST:  80 mL OMNIPAQUE IOHEXOL 300 MG/ML  SOLN COMPARISON:  06/17/2013 FINDINGS: Lower chest: No acute abnormality. Hepatobiliary: Innumerable hepatic hypodense lesions are new compared to the prior study and most likely a neoplastic process. The largest lesion is in the right lobe centrally measuring 4.5 cm. No biliary ductal dilatation identified. Patient is status post cholecystectomy. Pancreas: Unremarkable. No pancreatic ductal dilatation or surrounding inflammatory changes. Spleen: Normal in size without focal abnormality. Adrenals/Urinary Tract: Adrenal glands are unremarkable. Kidneys are normal, without renal calculi, focal lesion, or hydronephrosis. Bladder is unremarkable. Stomach/Bowel: There is thickening of the gastric mucosa in the cardia region. Endoscopic evaluation recommended for possible mass. These findings could also be attributed to patient's history of Crohn's disease. There is a right lower quadrant ostomy. No dilated bowel is seen to suggest obstruction. Presacral fluid collection identified measuring 4.2 cm. Possible bowel wall thickening noted in the pelvis. Evaluation for bowel pathology was limited by lack of contrast. There is an anastomosis in the right hemipelvis. Vascular/Lymphatic: Aortic atherosclerosis. No enlarged abdominal or pelvic lymph nodes. Reproductive: Status post hysterectomy. No adnexal masses. Other: Small amount fluid in the cul-de-sac. Right lower quadrant ostomy. Musculoskeletal: Innumerable osteoblastic lesions identified in the lumbar spine and pelvis which are new compared to the prior study consistent with metastatic disease. There is also a  small osteoblastic lesion in the left femoral neck. IMPRESSION: 1. New hepatic lesions concerning for neoplasm. 2. New osteoblastic lesions concerning for neoplasm. 3. Abnormal appearance of the gastric cardia and GE junction with possible mass versus inflammation related to patient's history of Crohn's disease. This could represent the primary neoplasm. Endoscopic correlation is recommended. 4. Fluid collection in the presacral region which may be related to abnormally thickened bowel loops. An abscess is not excluded. It should be noted that evaluation for bowel pathology was limited by the lack GI contrast. Electronically Signed   By: Sammie Bench M.D.   On: 03/30/2022 20:43    ASSESSMENT & PLAN [name] is a 67 y.o. female who presents to the diagnostic clinic for evaluation of recent CT abdomen/pelvis concerning for malignancy. We reviewed the CT images in detailed that outline areas of concern including multifocal liver and bone lesions. Additionally, there is thickening in the gastric cardia. We reviewed recommend diagnostic workup including laboratory evaluation today, US guided liver biopsy, EGD to further evaluate gastric region and CT chest to complete staging. Patient expressed understanding of the planned workup and agreed to move forward.    #Liver lesions #Bone lesions #Gastric cardia thickening: --Labs today to check CBC, CMP, CEA and CA19-9  levels --Ordered CT chest to complete staging --Patient requested referral to [facility] for EGD. Referral sent today.  --Requested STAT US guided liver biopsy --Discussed need for port for anticipated chemotherapy due to suspicion for metastatic disease. --Plan to follow up with Dr. Burr Medico 2-3 days after biopsy to review diagnosis and discuss treatment options  #Weight loss/early satiety: --Advised to eat small, frequent meals and supplement with protein shakes --Sent referral to nutrition team  #Epigastric pain: --Currently on  oxycodone 5 mg q 4-6 hours. Okay to continue and take 1-2 tablets every 4-6 hours. Sent refill today  #Nausea: --Currently on carafate, too early to tell if there is symptom relief --Sent zofran 8 mg ODT   #Hypotension: --Initial BP at office was 80/51, repeat was 105/53 --Asymptomatic but arrange 1L of IV fluids today --Advised to monitor BP at home. Not on any antihypertensive medications. Encouraged PO hydration.    Orders Placed This Encounter  Procedures   CT Chest W Contrast    Standing Status:   Future    Standing Expiration Date:   04/04/2023    Order Specific Question:   If indicated for the ordered procedure, I authorize the administration of contrast media per Radiology protocol    Answer:   Yes    Order Specific Question:   Does the patient have a contrast media/X-ray dye allergy?    Answer:   No    Order Specific Question:   Preferred imaging location?    Answer:   [facility]   US BIOPSY (LIVER)    Standing Status:   Future    Standing Expiration Date:   04/05/2023    Order Specific Question:   Lab orders requested (DO NOT place separate lab orders, these will be automatically ordered during procedure specimen collection):    Answer:   Surgical Pathology    Order Specific Question:   Reason for Exam (SYMPTOM  OR DIAGNOSIS REQUIRED)    Answer:   liver lesions, concerning for metastatic disease    Order Specific Question:   Preferred location?    Answer:   [facility]   IR IMAGING GUIDED PORT INSERTION    Standing Status:   Future    Standing Expiration Date:   04/05/2023    Order Specific Question:   Reason for Exam (SYMPTOM  OR DIAGNOSIS REQUIRED)    Answer:   port for chemo    Order Specific Question:   Preferred Imaging Location?    Answer:   [facility]   CBC with Differential (Cancer Center Only)    Standing Status:   Future    Standing Expiration Date:   04/05/2023   [facility] (Access)-[facility] ONLY    Standing Status:   Future    Standing  Expiration Date:   04/05/2023   CMP ([place] only)    Standing Status:   Future    Standing Expiration Date:   04/05/2023   CA 19.9    Standing Status:   Future    Standing Expiration Date:   04/04/2023   Ambulatory Referral to [facility] Nutrition    Referral Priority:   Routine    Referral Type:   Consultation    Referral Reason:   Specialty Services Required    Number of Visits Requested:   1    All questions were answered. The patient knows to call the clinic with any problems, questions or concerns.  I have spent a total of 60 minutes minutes of face-to-face  and non-face-to-face time, preparing to see the patient, obtaining and/or reviewing separately obtained history, performing a medically appropriate examination, counseling and educating the patient, ordering medications/tests/procedures, referring and communicating with other health care professionals, documenting clinical information in the electronic health record,and care coordination.   Dede Query, PA-C Department of Hematology/Oncology [place] at [facility] Phone: [phone]  Patient was seen with Dr.  Burr Medico  Addendum I have seen the patient, examined her. I agree with the assessment and and plan and have edited the notes.   67 yo female with PMH of Crohn disease, status post total colectomy, presented with worsening abdominal pain and weight loss.  She was seen in the ED last week.  I personally reviewed her CT scan images, and discussed the findings with her and her daughter in detail.  This is certainly concerning for metastatic cancer to liver, possible GE junction or gastric cancer.  I recommended liver biopsy, and the EGD.  Per patient's request, we will refer her to [facility] for endoscopy.  Will obtain baseline labs including tumor markers today.  All questions were answered.  I will see her back after her liver biopsy.   Truitt Merle MD 04/04/2022

## 2022-04-05 ENCOUNTER — Other Ambulatory Visit: Payer: Self-pay | Admitting: Physician Assistant

## 2022-04-05 MED ORDER — ONDANSETRON 8 MG PO TBDP: 8.0000 mg | ORAL_TABLET | Freq: Three times a day (TID) | ORAL | 0 refills | Status: AC | PRN

## 2022-04-05 MED ORDER — OXYCODONE HCL 5 MG PO TABS: 5.0000 mg | ORAL_TABLET | ORAL | 0 refills | Status: DC | PRN

## 2022-04-05 NOTE — Progress Notes (Signed)
I spoke with [name] she is aware of her appt with Dr Burr Medico on 2/9 at 1120.  Her pharmacy list was not up to date and her prescriptions went to the wrong pharmacy.  I have updated her pharmacy preference and have asked Murray Hodgkins to send the prescriptions to that pharmacy.

## 2022-04-06 LAB — CANCER ANTIGEN 19-9: CA 19-9: 7434 U/mL — ABNORMAL HIGH (ref 0–35)

## 2022-04-06 NOTE — Progress Notes (Signed)
I received a call from [facility] referral coordinator.  Ms Mccain has an appt on 2/1 at 46 with Dr Michail Sermon.  They left her a vm with this information.  I also called and left her a vm.  I asked her to return my call so I know she received my message.

## 2022-04-08 ENCOUNTER — Ambulatory Visit (HOSPITAL_COMMUNITY)
Admission: RE | Admit: 2022-04-08 | Discharge: 2022-04-08 | Disposition: A | Discharge status: Home / Self Care | Payer: Medicare HMO | Source: Ambulatory Visit | Attending: Physician Assistant | Admitting: Physician Assistant

## 2022-04-08 DIAGNOSIS — C787 Secondary malignant neoplasm of liver and intrahepatic bile duct: Secondary | ICD-10-CM | POA: Diagnosis present

## 2022-04-08 MED ORDER — SODIUM CHLORIDE (PF) 0.9 % IJ SOLN
INTRAMUSCULAR | Status: AC
  Filled 2022-04-08: qty 50

## 2022-04-08 MED ORDER — IOHEXOL 300 MG/ML  SOLN
60.0000 mL | Freq: Once | INTRAMUSCULAR | Status: AC | PRN
  Administered 2022-04-08: 60 mL via INTRAVENOUS

## 2022-04-11 ENCOUNTER — Inpatient Hospital Stay: Payer: Medicare HMO | Attending: Physician Assistant

## 2022-04-11 ENCOUNTER — Other Ambulatory Visit: Payer: Self-pay

## 2022-04-11 DIAGNOSIS — C169 Malignant neoplasm of stomach, unspecified: Secondary | ICD-10-CM | POA: Insufficient documentation

## 2022-04-11 DIAGNOSIS — C7800 Secondary malignant neoplasm of unspecified lung: Secondary | ICD-10-CM | POA: Insufficient documentation

## 2022-04-11 DIAGNOSIS — Z5111 Encounter for antineoplastic chemotherapy: Secondary | ICD-10-CM | POA: Insufficient documentation

## 2022-04-11 DIAGNOSIS — C7951 Secondary malignant neoplasm of bone: Secondary | ICD-10-CM | POA: Insufficient documentation

## 2022-04-11 DIAGNOSIS — R110 Nausea: Secondary | ICD-10-CM | POA: Insufficient documentation

## 2022-04-11 DIAGNOSIS — C787 Secondary malignant neoplasm of liver and intrahepatic bile duct: Secondary | ICD-10-CM | POA: Insufficient documentation

## 2022-04-11 NOTE — Progress Notes (Signed)
Nutrition Assessment:  67 year old female with liver and bone lesions, gastric cardia thickening.  Past medical history of crohn's disease, s/p total colectomy, ostomy, anal fistula, CAD, MI.   Patient had EGD today.  Plan of care not yet determined.    Met with patient and daughter in RD's office.  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   Patient reports that for the last month appetite has been decreased.  Only able to eat 4 bites and then feels full.  Patient has been eating soups, cheeseburger, quesadilla.  Has purchased equate plus shakes (strawberry) but has not tried them yet.     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 Samples of ensure complete, boost VHC given to patient along with coupons and recipe book Discussed ways to add calories and protein Soft moist protein foods discussed. Handout given    MONITORING, EVALUATION, GOAL: weight trends, intake   NEXT VISIT: to be determined  Soraida Vickers B. Zenia Resides, [place], [place] Registered Dietitian [phone]

## 2022-04-12 ENCOUNTER — Other Ambulatory Visit: Payer: Self-pay | Admitting: Student

## 2022-04-12 ENCOUNTER — Other Ambulatory Visit: Payer: Self-pay | Admitting: Internal Medicine

## 2022-04-12 DIAGNOSIS — K769 Liver disease, unspecified: Secondary | ICD-10-CM

## 2022-04-12 NOTE — H&P (Signed)
Chief Complaint: Patient was seen in consultation today for   Referring Physician(s): Lincoln Brigham  Supervising Physician: {Supervising Physician:[id number]}  Patient Status: [facility] - Out-pt  History of Present Illness: [name] is a 67 y.o. female with a medical history significant for Crohn's disease s/p total colectomy. She presented to the ED 03/31/22 with complaints of generalized abdominal pain, early satiety and anorexia with weight loss. Imaging obtained that day showed multiple hepatic and osseous lesions concerning for metastatic disease. Imaging also showed an abnormal appearance of the gastric cardia and GE junction with possible mass considered to represent a primary neoplasm versus sequelae of Crohn's disease. She was discharged from the ED with outpatient follow up with GI and Oncology.   She met with her Oncology team 04/04/22 and recommendations were made for tissue sampling and port-a-catheter placement. Interventional Radiology has been asked to evaluate this patient for an image-guided liver lesion biopsy and port-a-catheter placement. Imaging reviewed and procedures approved by Dr. Maryelizabeth Kaufmann.   Past Medical History:  Diagnosis Date   Anal fistula    Arthritis    Coronary atherosclerosis of unspecified type of vessel, native or graft    Crohn's colitis ([place])    Heart murmur    History of colostomy    Hx of myocardial infarction 1986   Personal history of colonic polyps     Past Surgical History:  Procedure Laterality Date   [place] she thinks   COLOSTOMY  2012   IRRIGATION AND DEBRIDEMENT ABSCESS N/A 06/18/2013   Procedure: Irrigation and Debridiment of peristomal abcess;  Surgeon: Gayland Curry, MD;  Location: [facility];  Service: General;  Laterality: N/A;   LAPAROTOMY N/A 06/02/2014   Procedure: EXPLORATORY LAPAROTOMY/PARTIAL COLECTOMY COLOSTOMY;  Surgeon: Excell Seltzer,  MD;  Location: [facility];  Service: General;  Laterality: N/A;   TUBAL LIGATION  1993    Allergies: Remicade [infliximab]  Medications: Prior to Admission medications   Medication Sig Start Date End Date Taking? Authorizing Provider  acetaminophen (TYLENOL) 325 MG tablet Take 2 tablets (650 mg total) by mouth every 6 (six) hours as needed for fever, headache, mild pain or moderate pain. 06/23/14   Earnstine Regal, PA-C  aspirin 81 MG tablet Take 81 mg by mouth daily.      [provider]  famotidine (PEPCID) 20 MG tablet You can buy this at any drug store or the generic equivalent for use at home.  Discuss with Dr. Olevia Perches on return visist, 06/23/14   Earnstine Regal, PA-C  meclizine (ANTIVERT) 25 MG tablet Take 1 tablet by mouth 3 (three) times daily as needed.    [provider]  omeprazole (PRILOSEC) 40 MG capsule Take 1 capsule every day by oral route for 60 days. 03/22/22   [provider]  ondansetron (ZOFRAN-ODT) 8 MG disintegrating tablet Take 1 tablet (8 mg total) by mouth every 8 (eight) hours as needed for nausea or vomiting. 04/05/22   Dede Query T, PA-C  oxyCODONE (ROXICODONE) 5 MG immediate release tablet Take 1-2 tablets (5-10 mg total) by mouth every 4 (four) hours as needed for severe pain. 04/06/22   Lincoln Brigham, PA-C  sucralfate (CARAFATE) 1 GM/10ML suspension Take 1 g by mouth 4 (four) times daily.    [provider]     Family History  Problem Relation Age of Onset  Heart disease Mother    Stroke Father    Heart disease Brother    Prostate cancer Brother    Lung cancer Paternal Uncle    Colon polyps Neg Hx    Esophageal cancer Neg Hx    Rectal cancer Neg Hx    Stomach cancer Neg Hx     Social History   Socioeconomic History   Marital status: Divorced    Spouse name: Not on file   Number of children: 1   Years of education: Not on file   Highest education level: Not on file  Occupational History   Occupation:  disabled    Employer: DISABLED  Tobacco Use   Smoking status: Every Day    Packs/day: 0.50    Years: 50.00    Total pack years: 25.00    Types: Cigarettes   Smokeless tobacco: Never  Substance and Sexual Activity   Alcohol use: Yes    Comment: sparingly, for holidays   Drug use: No   Sexual activity: Not on file  Other Topics Concern   Not on file  Social History Narrative   One daughter age 33- Has PTSD, lives with patient   Divorced   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   Completed some college   Enjoys cleaning, spending time with family   Social Determinants of Health   Financial Resource Strain: Not on file  Food Insecurity: No Food Insecurity (04/04/2022)   Hunger Vital Sign    Worried About Running Out of Food in the Last Year: Never true    Ran Out of Food in the Last Year: Never true  Transportation Needs: No Transportation Needs (04/04/2022)   PRAPARE - Hydrologist (Medical): No    Lack of Transportation (Non-Medical): No  Physical Activity: Not on file  Stress: Not on file  Social Connections: Not on file    Review of Systems: A 12 point ROS discussed and pertinent positives are indicated in the HPI above.  All other systems are negative.  Review of Systems  Vital Signs: There were no vitals taken for this visit.  Physical Exam  Imaging: CT Chest W Contrast  Result Date: 04/08/2022 CLINICAL DATA:  staging for metastatic disease, secondary malignant neoplasm of liver EXAM: CT CHEST WITH CONTRAST TECHNIQUE: Multidetector CT imaging of the chest was performed during intravenous contrast administration. RADIATION DOSE REDUCTION: This exam was performed according to the departmental dose-optimization program which includes automated exposure control, adjustment of the mA and/or kV according to patient size and/or use of iterative reconstruction technique. CONTRAST:  28m OMNIPAQUE IOHEXOL 300 MG/ML  SOLN COMPARISON:   03/30/2022 FINDINGS: Cardiovascular: The heart is not enlarged. Left ventricular dilatation. No pericardial effusion. Ectasia of the ascending thoracic aorta without frank aneurysm. 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 Mediastinum/Nodes: There is a mass centered at the gastric cardia and gastroesophageal junction, consistent with likely primary gastric malignancy. The remainder of the thoracic esophagus, trachea, and thyroid are unremarkable. Likely enlarged lymph node along the right lateral margin of the distal thoracic esophagus reference image 111/2, measuring 11 mm in short axis. No other pathologic mediastinal, hilar, or axillary adenopathy. Lungs/Pleura: No pulmonary nodules or masses. Scattered areas of subpleural ground-glass airspace disease are seen within the bilateral apices and left lower lobe, which may be inflammatory or infectious. No effusion or pneumothorax. Mild left  lower lobe bronchial wall thickening and bronchiectasis, which could be sequela of chronic aspiration or bronchitis. Central airways are patent. Upper Abdomen: The innumerable hypodense masses throughout the liver parenchyma are again identified consistent with metastatic disease. As described above, there is irregular masslike mural thickening of the distal thoracic esophagus, gastroesophageal junction, and gastric cardia consistent with primary gastric malignancy. Endoscopy is recommended if not previously performed. Lymphadenopathy within the gastrohepatic ligament consistent with nodal metastases, measuring up to 11 mm in short axis. This is unchanged. Musculoskeletal: There are no acute displaced fractures. Multiple sclerotic lesions are seen throughout the thoracolumbar spine, consistent with sclerotic bony metastases. Reconstructed images demonstrate no additional findings. IMPRESSION: 1. Irregular masslike  mural thickening involving the gastric cardia, consistent with primary gastric malignancy. This extends to involve the gastroesophageal junction and distal thoracic esophagus. Endoscopy recommended if not previously performed. 2. Stable innumerable liver hypodense masses consistent with hepatic metastatic disease. 3. Pathologic adenopathy within the right paraesophageal region and upper abdomen in the gastrohepatic ligament, consistent with nodal metastatic disease. 4. Diffuse sclerotic lesions throughout the thoracolumbar spine consistent with bony metastatic disease. 5. Mild left lower lobe bronchiectasis and bronchial wall thickening consistent with chronic aspiration or bronchitis. 6. Scattered subpleural ground-glass opacities within the lung apices and left lower lobe, likely inflammatory or infectious. 7.  Aortic Atherosclerosis (ICD10-I70.0). Electronically Signed   By: Randa Ngo M.D.   On: 04/08/2022 21:54   CT ABDOMEN PELVIS W CONTRAST  Result Date: 03/30/2022 CLINICAL DATA:  Epigastric pain, Crohn's disease EXAM: CT ABDOMEN AND PELVIS WITH CONTRAST TECHNIQUE: Multidetector CT imaging of the abdomen and pelvis was performed using the standard protocol following bolus administration of intravenous contrast. RADIATION DOSE REDUCTION: This exam was performed according to the departmental dose-optimization program which includes automated exposure control, adjustment of the mA and/or kV according to patient size and/or use of iterative reconstruction technique. CONTRAST:  80 mL OMNIPAQUE IOHEXOL 300 MG/ML  SOLN COMPARISON:  06/17/2013 FINDINGS: Lower chest: No acute abnormality. Hepatobiliary: Innumerable hepatic hypodense lesions are new compared to the prior study and most likely a neoplastic process. The largest lesion is in the right lobe centrally measuring 4.5 cm. No biliary ductal dilatation identified. Patient is status post cholecystectomy. Pancreas: Unremarkable. No pancreatic ductal  dilatation or surrounding inflammatory changes. Spleen: Normal in size without focal abnormality. Adrenals/Urinary Tract: Adrenal glands are unremarkable. Kidneys are normal, without renal calculi, focal lesion, or hydronephrosis. Bladder is unremarkable. Stomach/Bowel: There is thickening of the gastric mucosa in the cardia region. Endoscopic evaluation recommended for possible mass. These findings could also be attributed to patient's history of Crohn's disease. There is a right lower quadrant ostomy. No dilated bowel is seen to suggest obstruction. Presacral fluid collection identified measuring 4.2 cm. Possible bowel wall thickening noted in the pelvis. Evaluation for bowel pathology was limited by lack of contrast. There is an anastomosis in the right hemipelvis. Vascular/Lymphatic: Aortic atherosclerosis. No enlarged abdominal or pelvic lymph nodes. Reproductive: Status post hysterectomy. No adnexal masses. Other: Small amount fluid in the cul-de-sac. Right lower quadrant ostomy. Musculoskeletal: Innumerable osteoblastic lesions identified in the lumbar spine and pelvis which are new compared to the prior study consistent with metastatic disease. There is also a small osteoblastic lesion in the left femoral neck. IMPRESSION: 1. New hepatic lesions concerning for neoplasm. 2. New osteoblastic lesions concerning for neoplasm. 3. Abnormal appearance of the gastric cardia and GE junction with possible mass versus inflammation related to patient's history of Crohn's disease. This could  represent the primary neoplasm. Endoscopic correlation is recommended. 4. Fluid collection in the presacral region which may be related to abnormally thickened bowel loops. An abscess is not excluded. It should be noted that evaluation for bowel pathology was limited by the lack GI contrast. Electronically Signed   By: Sammie Bench M.D.   On: 03/30/2022 20:43    Labs:  CBC: Recent Labs    03/30/22 1521 04/04/22 1245  WBC  9.6 8.7  HGB 13.0 12.9  HCT 40.9 37.8  PLT 280 269    COAGS: No results for input(s): "INR", "APTT" in the last 8760 hours.  BMP: Recent Labs    03/30/22 1521 04/04/22 1245  NA 129* 126*  K 4.0 4.7  CL 97* 98  CO2 19* 19*  GLUCOSE 93 104*  BUN 14 18  CALCIUM 8.9 9.1  CREATININE 1.23* 1.65*  GFRNONAA 48* 34*    LIVER FUNCTION TESTS: Recent Labs    03/30/22 1521 04/04/22 1245  BILITOT 0.3 0.4  AST 51* 43*  ALT 26 21  ALKPHOS 383* 492*  PROT 7.7 7.4  ALBUMIN 3.7 3.7    TUMOR MARKERS: Recent Labs    04/04/22 1245  CEA 6,205.22*    Assessment and Plan:  Multifocal liver masses; pending chemotherapy with need for durable venous access: [name], 67 year old female, presents today to the [facility] department for an image-guided liver lesion biopsy and port-a-catheter placement.   Risks and benefits of liver lesion biopsy were discussed with the patient and/or patient's family including, but not limited to bleeding, infection, damage to adjacent structures or low yield requiring additional tests.  Risks and benefits of image-guided port-a-catheter placement were discussed with the patient including, but not limited to bleeding, infection, pneumothorax, or fibrin sheath development and need for additional procedures.  All of the questions were answered and there is agreement to proceed.  Consent signed and in chart.  Thank you for this interesting consult.  I greatly enjoyed meeting [name] and look forward to participating in their care.  A copy of this report was sent to the requesting provider on this date.  Electronically Signed: Soyla Dryer, AGACNP-BC [phone] 04/12/2022, 1:27 PM   I spent a total of  30 Minutes   in face to face in clinical consultation, greater than 50% of which was counseling/coordinating care for port-a-catheter placement and liver lesion biopsy

## 2022-04-13 ENCOUNTER — Other Ambulatory Visit: Payer: Self-pay | Admitting: Physician Assistant

## 2022-04-13 ENCOUNTER — Other Ambulatory Visit: Payer: Self-pay

## 2022-04-13 ENCOUNTER — Ambulatory Visit (HOSPITAL_COMMUNITY)
Admission: RE | Admit: 2022-04-13 | Discharge: 2022-04-13 | Disposition: A | Discharge status: Home / Self Care | Payer: Medicare HMO | Source: Ambulatory Visit | Attending: Physician Assistant | Admitting: Physician Assistant

## 2022-04-13 ENCOUNTER — Encounter: Payer: Self-pay | Admitting: Hematology

## 2022-04-13 DIAGNOSIS — C787 Secondary malignant neoplasm of liver and intrahepatic bile duct: Secondary | ICD-10-CM | POA: Insufficient documentation

## 2022-04-13 DIAGNOSIS — K769 Liver disease, unspecified: Secondary | ICD-10-CM

## 2022-04-13 DIAGNOSIS — R634 Abnormal weight loss: Secondary | ICD-10-CM

## 2022-04-13 DIAGNOSIS — K3189 Other diseases of stomach and duodenum: Secondary | ICD-10-CM

## 2022-04-13 HISTORY — PX: IR US LIVER BIOPSY: IMG936

## 2022-04-13 HISTORY — PX: IR IMAGING GUIDED PORT INSERTION: IMG5740

## 2022-04-13 LAB — CBC
HCT: 33.3 % — ABNORMAL LOW (ref 36.0–46.0)
Hemoglobin: 11.4 g/dL — ABNORMAL LOW (ref 12.0–15.0)
MCH: 28.7 pg (ref 26.0–34.0)
MCHC: 34.2 g/dL (ref 30.0–36.0)
MCV: 83.9 fL (ref 80.0–100.0)
Platelets: 226 10*3/uL (ref 150–400)
RBC: 3.97 MIL/uL (ref 3.87–5.11)
RDW: 13.1 % (ref 11.5–15.5)
WBC: 12.2 10*3/uL — ABNORMAL HIGH (ref 4.0–10.5)
nRBC: 0.2 % (ref 0.0–0.2)

## 2022-04-13 LAB — PROTIME-INR
INR: 1.1 (ref 0.8–1.2)
Prothrombin Time: 13.6 seconds (ref 11.4–15.2)

## 2022-04-13 MED ORDER — LIDOCAINE-EPINEPHRINE 1 %-1:100000 IJ SOLN
INTRAMUSCULAR | Status: AC
  Administered 2022-04-13: 5 mL
  Filled 2022-04-13: qty 1

## 2022-04-13 MED ORDER — FENTANYL CITRATE (PF) 100 MCG/2ML IJ SOLN
INTRAMUSCULAR | Status: AC
  Filled 2022-04-13: qty 2

## 2022-04-13 MED ORDER — HEPARIN SOD (PORK) LOCK FLUSH 100 UNIT/ML IV SOLN
INTRAVENOUS | Status: AC
  Administered 2022-04-13: 500 [IU]
  Filled 2022-04-13: qty 5

## 2022-04-13 MED ORDER — FENTANYL CITRATE (PF) 100 MCG/2ML IJ SOLN
INTRAMUSCULAR | Status: AC | PRN
  Administered 2022-04-13 (×2): 50 ug via INTRAVENOUS

## 2022-04-13 MED ORDER — LIDOCAINE-EPINEPHRINE 1 %-1:100000 IJ SOLN
INTRAMUSCULAR | Status: AC
  Administered 2022-04-13: 10 mL
  Filled 2022-04-13: qty 1

## 2022-04-13 MED ORDER — SODIUM CHLORIDE 0.9 % IV SOLN: INTRAVENOUS | Status: DC

## 2022-04-13 MED ORDER — GELATIN ABSORBABLE 12-7 MM EX MISC
CUTANEOUS | Status: AC
  Filled 2022-04-13: qty 1

## 2022-04-13 MED ORDER — MIDAZOLAM HCL 2 MG/2ML IJ SOLN
INTRAMUSCULAR | Status: AC
  Filled 2022-04-13: qty 2

## 2022-04-13 MED ORDER — MIDAZOLAM HCL 2 MG/2ML IJ SOLN
INTRAMUSCULAR | Status: AC | PRN
  Administered 2022-04-13 (×2): 1 mg via INTRAVENOUS

## 2022-04-13 NOTE — Procedures (Signed)
Pre Procedure Dx: Liver lesions worrisome for metastatic disease Post Procedural Dx: Same  Technically successful US guided biopsy of dominant hypoechoic mass within the left lobe of the liver.  EBL: Trace No immediate complications.   Ronny Bacon, MD Pager #: [phone]

## 2022-04-13 NOTE — Sedation Documentation (Signed)
End of Liver biopsy. Setting up for portacath palcement

## 2022-04-15 ENCOUNTER — Other Ambulatory Visit: Payer: Self-pay

## 2022-04-15 ENCOUNTER — Encounter: Payer: Self-pay | Admitting: Hematology

## 2022-04-15 ENCOUNTER — Inpatient Hospital Stay: Payer: Medicare HMO | Admitting: Hematology

## 2022-04-15 ENCOUNTER — Telehealth: Payer: Self-pay | Admitting: Hematology

## 2022-04-15 ENCOUNTER — Inpatient Hospital Stay: Payer: Medicare HMO | Admitting: Licensed Clinical Social Worker

## 2022-04-15 VITALS — BP 110/64 | HR 97 | Temp 98.2°F | Resp 14 | Wt 99.9 lb

## 2022-04-15 DIAGNOSIS — R109 Unspecified abdominal pain: Secondary | ICD-10-CM

## 2022-04-15 DIAGNOSIS — C160 Malignant neoplasm of cardia: Secondary | ICD-10-CM

## 2022-04-15 DIAGNOSIS — C7800 Secondary malignant neoplasm of unspecified lung: Secondary | ICD-10-CM | POA: Diagnosis not present

## 2022-04-15 DIAGNOSIS — Z5111 Encounter for antineoplastic chemotherapy: Secondary | ICD-10-CM | POA: Diagnosis present

## 2022-04-15 DIAGNOSIS — C787 Secondary malignant neoplasm of liver and intrahepatic bile duct: Secondary | ICD-10-CM | POA: Diagnosis not present

## 2022-04-15 DIAGNOSIS — C169 Malignant neoplasm of stomach, unspecified: Secondary | ICD-10-CM | POA: Insufficient documentation

## 2022-04-15 DIAGNOSIS — C7951 Secondary malignant neoplasm of bone: Secondary | ICD-10-CM | POA: Diagnosis not present

## 2022-04-15 DIAGNOSIS — R110 Nausea: Secondary | ICD-10-CM | POA: Diagnosis not present

## 2022-04-15 MED ORDER — ALPRAZOLAM 0.25 MG PO TABS: 0.2500 mg | ORAL_TABLET | Freq: Three times a day (TID) | ORAL | 0 refills | Status: AC | PRN

## 2022-04-15 MED ORDER — LIDOCAINE-PRILOCAINE 2.5-2.5 % EX CREA: 1.0000 | TOPICAL_CREAM | CUTANEOUS | 2 refills | Status: AC | PRN

## 2022-04-15 MED ORDER — PROCHLORPERAZINE MALEATE 10 MG PO TABS: 10.0000 mg | ORAL_TABLET | Freq: Four times a day (QID) | ORAL | 0 refills | Status: AC | PRN

## 2022-04-15 NOTE — Progress Notes (Signed)
[place] Work  Initial Assessment   KANAE CANARIO is a 67 y.o. year old female accompanied by patient, daughter, and family friend . Clinical Social Work was referred by medical provider for assessment of psychosocial needs.   SDOH (Social Determinants of Health) assessments performed: Yes   SDOH Screenings   Food Insecurity: No Food Insecurity (04/04/2022)  Housing: Low Risk  (04/04/2022)  Transportation Needs: No Transportation Needs (04/04/2022)  Utilities: Not At Risk (04/04/2022)  Depression (PHQ2-9): Low Risk  (04/04/2022)  Tobacco Use: High Risk (04/15/2022)     Distress Screen completed: No     No data to display            Family/Social Information:  Housing Arrangement: patient lives with her daughter. Family members/support persons in your life? Pt has limited support, her daughter is available to assist and a good friend of the patient accompanied them to the initial consult and will be able to offer limited support. Transportation concerns: pt's daughter will provide support  Employment: Disabled due to Crohns for a number of years.  Recently pt's disability was converted to social security retirement due to her age.  Income source: Paediatric nurse concerns: Yes, current concerns Type of concern: Utilities, Rent/ mortgage, and Medical bills Food access concerns: no Religious or spiritual practice: Not known Services Currently in place:  none  Coping/ Adjustment to diagnosis: Patient understands treatment plan and what happens next? yes Concerns about diagnosis and/or treatment: Overwhelmed by information, How I will pay for the services I need, and Quality of life Patient reported stressors: Finances and Adjusting to my illness Hopes and/or priorities: Pt's priority is to start treatment w/ the hope of positive results Patient enjoys  not addressed Current coping skills/ strengths: Capable of independent living , Motivation for  treatment/growth , and Physical Health     SUMMARY: Current SDOH Barriers:  Financial constraints related to fixed income and Limited social support  Clinical Social Work Clinical Goal(s):  Explore community resource options for unmet needs related to:  Financial Strain   Interventions: Discussed common feeling and emotions when being diagnosed with cancer, and the importance of support during treatment Informed patient of the support team roles and support services at [facility] Provided [place] contact information and encouraged patient to call with any questions or concerns Referred patient to financial resource specialist to apply for the [employer], encouraged pt to apply for Medicaid to assist w/ medical bills as she is presently paying co-pays which are adding up.  Provided pt w/ contact information for [facility] for inquiries regarding pt's Medicare plan.     Follow Up Plan:  Will provide pt with a [employer] card at first infusion. Patient verbalizes understanding of plan: Yes    Henriette Combs, LCSW

## 2022-04-15 NOTE — Progress Notes (Signed)
I met with [name] and her daughter after  her follow up with Dr Burr Medico.  I had met with [name] previously. I explained the services provided at [facility] and provided written information.  I explained the alight grant and let  her know one of the financial advisors will reach out to  her at the time of her chemo education class. I told her that she will be scheduled for chemotherapy education class prior to receiving chemotherapy. I recommended she bring another person with her. I told her our schedulers will call her with those appts.  All questions were answered. She verbalized understanding.

## 2022-04-15 NOTE — Progress Notes (Signed)
START ON PATHWAY REGIMEN - Gastroesophageal     A cycle is every 14 days:     Oxaliplatin      Leucovorin      Fluorouracil      Fluorouracil   **Always confirm dose/schedule in your pharmacy ordering system**  Patient Characteristics: Distant Metastases (cM1/pM1) / Locally Recurrent Disease, Adenocarcinoma - Esophageal, GE Junction, and Gastric, First Line, HER2 Negative/Unknown, PD?L1 Expression CPS < 5/Negative/Unknown, MSS/pMMR or MSI Unknown Therapeutic Status: Distant Metastases (No Additional Staging) Histology: Adenocarcinoma Disease Classification: GE Junction Line of Therapy: First Line HER2 Status: Awaiting Test Results PD-L1 Expression Status: Awaiting Test Results Microsatellite/Mismatch Repair Status: Unknown Intent of Therapy: Non-Curative / Palliative Intent, Discussed with Patient 

## 2022-04-15 NOTE — Telephone Encounter (Signed)
Per 2/9 IB, msg left

## 2022-04-15 NOTE — Progress Notes (Signed)
[place]   [phone] Fax:(336) [phone]   Clinic Follow up Note   Patient Care Team: Clovia Cuff, MD as PCP - General (Internal Medicine) Truitt Merle, MD as Consulting Physician (Oncology)  Date of Service:  04/15/2022  CHIEF COMPLAINT: f/u of Gastric Cancer  CURRENT THERAPY:  Gastroesophageal Folfox  q14d x 6 cycles   ASSESSMENT:  [name] is a 67 y.o. 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  CT scan showed diffuse liver and pulm metastasis. -I discussed her liver biopsy results, which showed poorly differentiated carcinoma.  I spoke with pathologist Dr. Saralyn Pilar today, the immunostain pattern was non specific.  Patient underwent EGD by Dr. Watt Climes which showed a large mass in the GE junction and gastric cardia, biopsy showed poorly differentiated carcinoma.  -I reviewed the above findings with patient and her family, the imaging and the biopsy results are consistent with metastatic gastric cancer -I will request HER2, MMR, PD-L1 and FO test  -I discussed the overall poor prognosis, due to the aggressive nature of the disease, and the incurable nature, especially the high tumor burden and her low PS -I recommend first-line chemotherapy FOLFOX, will reduce the dose for first cycle to see how she tolerates. --Chemotherapy consent: Side effects including but does not not limited to, fatigue, nausea, vomiting, diarrhea, hair loss, neuropathy, fluid retention, renal and kidney dysfunction, neutropenic fever, needed for blood transfusion, bleeding, were discussed with patient in great detail. She agrees to proceed. -The goal of chemotherapy is palliative, to prolong her life and improve cancer-related symptoms. -Depend on the molecular testing results, I will add on immunotherapy or EGFR antibody if  indicated.  2.  Nausea, epigastric pain, weight loss, insomnia  -Secondary to her underlying metastatic malignancy -We discussed symptom management -She is on oxycodone every 4 hours as needed, pain is overall controlled. -I refilled her Xanax, called in Compazine and Emla cream.   PLAN: -Discuss biopsy results  -I prescribe Xanax, Emla cream, and Compazine -I recommend her to start first-line chemotherapy FOLFOX, discussed it side effects reduce first cycle to see how the pt tolerates. -will order MMR, HER2, PD-L1, and foundation 1 on her biopsy samples. -Social worker referral, we discussed Medicaid and disability application  SUMMARY OF ONCOLOGIC HISTORY: Oncology History  Gastric cancer ([place])  04/15/2022 Initial Diagnosis   Gastric cancer ([place])   04/20/2022 -  Chemotherapy   Patient is on Treatment Plan : GASTROESOPHAGEAL FOLFOX q14d x 6 cycles      Imaging        INTERVAL HISTORY:  [name] is here for a follow up of  She was last seen by  PA-C Dede Query on 04/04/2022 She presents to the clinic accompanied by family. Pt states she feels nervous and anxious.Pt states that her appetite is not good. But she has been drinking the Equate.Pt states that she has pain going across the abdomen. Pt has not been taking the Asprin she was told to stop. Pt is unable to sleep due to pain and anxiety.   All other systems were reviewed with the patient and are negative.  MEDICAL HISTORY:  Past Medical History:  Diagnosis Date   Anal fistula    Arthritis    Coronary atherosclerosis of unspecified type of vessel, native or graft    Crohn's colitis ([place])  Heart murmur    History of colostomy    Hx of myocardial infarction 1986   Personal history of colonic polyps     SURGICAL HISTORY: Past Surgical History:  Procedure Laterality Date   [place] she thinks   COLOSTOMY  2012   IR IMAGING  GUIDED PORT INSERTION  04/13/2022   IR US LIVER BIOPSY  04/13/2022   IRRIGATION AND DEBRIDEMENT ABSCESS N/A 06/18/2013   Procedure: Irrigation and Debridiment of peristomal abcess;  Surgeon: Gayland Curry, MD;  Location: [facility];  Service: General;  Laterality: N/A;   LAPAROTOMY N/A 06/02/2014   Procedure: EXPLORATORY LAPAROTOMY/PARTIAL COLECTOMY COLOSTOMY;  Surgeon: Excell Seltzer, MD;  Location: [facility];  Service: General;  Laterality: N/A;   [place]    I have reviewed the social history and family history with the patient and they are unchanged from previous note.  ALLERGIES:  is allergic to remicade [infliximab].  MEDICATIONS:  Current Outpatient Medications  Medication Sig Dispense Refill   ALPRAZolam (XANAX) 0.25 MG tablet Take 1 tablet (0.25 mg total) by mouth 3 (three) times daily as needed for anxiety. 30 tablet 0   lidocaine-prilocaine (EMLA) cream Apply 1 Application topically as needed. 30 g 2   acetaminophen (TYLENOL) 325 MG tablet Take 2 tablets (650 mg total) by mouth every 6 (six) hours as needed for fever, headache, mild pain or moderate pain.     aspirin 81 MG tablet Take 81 mg by mouth daily.       famotidine (PEPCID) 20 MG tablet You can buy this at any drug store or the generic equivalent for use at home.  Discuss with Dr. Olevia Perches on return visist,     meclizine (ANTIVERT) 25 MG tablet Take 1 tablet by mouth 3 (three) times daily as needed.     omeprazole (PRILOSEC) 40 MG capsule Take 1 capsule every day by oral route for 60 days.     ondansetron (ZOFRAN-ODT) 8 MG disintegrating tablet Take 1 tablet (8 mg total) by mouth every 8 (eight) hours as needed for nausea or vomiting. 60 tablet 0   oxyCODONE (ROXICODONE) 5 MG immediate release tablet Take 1-2 tablets (5-10 mg total) by mouth every 4 (four) hours as needed for severe pain. 120 tablet 0   sucralfate (CARAFATE) 1 GM/10ML suspension Take 1 g by mouth 4 (four) times daily.     No current facility-administered  medications for this visit.    PHYSICAL EXAMINATION: ECOG PERFORMANCE STATUS: 2 - Symptomatic, <50% confined to bed  Vitals:   04/15/22 1134  BP: 110/64  Pulse: 97  Resp: 14  Temp: 98.2 F (36.8 C)  SpO2: 100%   Wt Readings from Last 3 Encounters:  04/15/22 99 lb 14.4 oz (45.3 kg)  04/13/22 99 lb (44.9 kg)  04/04/22 95 lb 12.8 oz (43.5 kg)      HEART: regular rate & rhythm and no murmurs and (-)  no lower extremity edema ABDOMEN:(-) abdomen soft, non-tender and normal bowel sounds   LABORATORY DATA:  I have reviewed the data as listed    Latest Ref Rng & Units 04/13/2022    7:22 AM 04/04/2022   12:45 PM 03/30/2022    3:21 PM  CBC  WBC 4.0 - 10.5 K/uL 12.2  8.7  9.6   Hemoglobin 12.0 - 15.0 g/dL 11.4  12.9  13.0  Hematocrit 36.0 - 46.0 % 33.3  37.8  40.9   Platelets 150 - 400 K/uL 226  269  280         Latest Ref Rng & Units 04/04/2022   12:45 PM 03/30/2022    3:21 PM 07/17/2014    4:37 PM  CMP  Glucose 70 - 99 mg/dL 104  93  131   BUN 8 - 23 mg/dL 18  14  7   $ Creatinine 0.44 - 1.00 mg/dL 1.65  1.23  0.67   Sodium 135 - 145 mmol/L 126  129  133   Potassium 3.5 - 5.1 mmol/L 4.7  4.0  4.8   Chloride 98 - 111 mmol/L 98  97  97   CO2 22 - 32 mmol/L 19  19  30   $ Calcium 8.9 - 10.3 mg/dL 9.1  8.9  10.2   Total Protein 6.5 - 8.1 g/dL 7.4  7.7  8.7   Total Bilirubin 0.3 - 1.2 mg/dL 0.4  0.3  0.3   Alkaline Phos 38 - 126 U/L 492  383  77   AST 15 - 41 U/L 43  51  17   ALT 0 - 44 U/L 21  26  9       $ RADIOGRAPHIC STUDIES: I have personally reviewed the radiological images as listed and agreed with the findings in the report. No results found.    Orders Placed This Encounter  Procedures   CBC with Differential ([place] Only)    Standing Status:   Future    Standing Expiration Date:   04/21/2023   CMP ([place] only)    Standing Status:   Future    Standing Expiration Date:   04/21/2023   CBC with Differential ([place] Only)    Standing Status:    Future    Standing Expiration Date:   05/05/2023   CMP ([place] only)    Standing Status:   Future    Standing Expiration Date:   05/05/2023   CBC with Differential ([place] Only)    Standing Status:   Future    Standing Expiration Date:   05/19/2023   CMP ([place] only)    Standing Status:   Future    Standing Expiration Date:   05/19/2023   All questions were answered. The patient knows to call the clinic with any problems, questions or concerns. No barriers to learning was detected. The total time spent in the appointment was 70 minutes.     Truitt Merle, MD 04/15/2022   Felicity Coyer, CMA, am acting as scribe for Truitt Merle, MD.   I have reviewed the above documentation for accuracy and completeness, and I agree with the above.

## 2022-04-16 ENCOUNTER — Other Ambulatory Visit: Payer: Self-pay

## 2022-04-18 ENCOUNTER — Encounter: Payer: Self-pay | Admitting: Hematology

## 2022-04-18 ENCOUNTER — Telehealth: Payer: Self-pay | Admitting: *Deleted

## 2022-04-18 LAB — SURGICAL PATHOLOGY

## 2022-04-18 NOTE — Telephone Encounter (Signed)
"  [name]'s daughter Lovena Le [phone]), Navigator transferred me over for assistance with FMLA.  Made request today for FMLA.  What is your fax number.  I provided [employer] fax number [phone].  Received an authorization form and cover sheet.  Who is to complete and sign these and who do I need to get them to?" Provided main office fax number [phone]) and reviewed forms process.   The "[place] for Use and Disclosure" to be signed by patient, Legal Guardian or [place] is required to use (PHI) "Protected Health Information" for a third party [place] office "Cover Sheet for Disability/FMLA" provides information to assist staff process forms.   Forms may be returned to receptionist or registration staff for forms staff on tomorrow's appointment We ask to allow 7-10 business days (14-calendar) to process.  Currently no further questions or needs.

## 2022-04-18 NOTE — Progress Notes (Signed)
Pharmacist Chemotherapy Monitoring - Initial Assessment    Anticipated start date: 04/21/22   The following has been reviewed per standard work regarding the patient's treatment regimen: The patient's diagnosis, treatment plan and drug doses, and organ/hematologic function Lab orders and baseline tests specific to treatment regimen  The treatment plan start date, drug sequencing, and pre-medications Prior authorization status  Patient's documented medication list, including drug-drug interaction screen and prescriptions for anti-emetics and supportive care specific to the treatment regimen The drug concentrations, fluid compatibility, administration routes, and timing of the medications to be used The patient's access for treatment and lifetime cumulative dose history, if applicable  The patient's medication allergies and previous infusion related reactions, if applicable   Changes made to treatment plan:  N/A  Follow up needed:  Follow [facility] closely.   Kennith Center, Pharm.D., CPP 04/18/2022@2$ :28 PM

## 2022-04-19 ENCOUNTER — Other Ambulatory Visit: Payer: Self-pay

## 2022-04-19 ENCOUNTER — Inpatient Hospital Stay: Payer: Medicare HMO

## 2022-04-19 DIAGNOSIS — C169 Malignant neoplasm of stomach, unspecified: Secondary | ICD-10-CM

## 2022-04-20 MED FILL — Dexamethasone Sodium Phosphate Inj 100 MG/10ML: INTRAMUSCULAR | Qty: 1 | Status: AC

## 2022-04-21 ENCOUNTER — Other Ambulatory Visit: Payer: Self-pay

## 2022-04-21 ENCOUNTER — Inpatient Hospital Stay: Payer: Medicare HMO

## 2022-04-21 ENCOUNTER — Inpatient Hospital Stay: Payer: Medicare HMO | Admitting: Hematology

## 2022-04-21 ENCOUNTER — Encounter: Payer: Self-pay | Admitting: Hematology

## 2022-04-21 VITALS — BP 99/61 | HR 95 | Temp 98.3°F | Resp 15 | Ht 59.5 in

## 2022-04-21 VITALS — Wt 95.3 lb

## 2022-04-21 DIAGNOSIS — Z95828 Presence of other vascular implants and grafts: Secondary | ICD-10-CM | POA: Insufficient documentation

## 2022-04-21 DIAGNOSIS — C160 Malignant neoplasm of cardia: Secondary | ICD-10-CM

## 2022-04-21 DIAGNOSIS — R570 Cardiogenic shock: Secondary | ICD-10-CM | POA: Diagnosis not present

## 2022-04-21 DIAGNOSIS — G893 Neoplasm related pain (acute) (chronic): Secondary | ICD-10-CM | POA: Diagnosis not present

## 2022-04-21 LAB — CMP (CANCER CENTER ONLY)
ALT: 29 U/L (ref 0–44)
AST: 83 U/L — ABNORMAL HIGH (ref 15–41)
Albumin: 3.4 g/dL — ABNORMAL LOW (ref 3.5–5.0)
Alkaline Phosphatase: 763 U/L — ABNORMAL HIGH (ref 38–126)
Anion gap: 10 (ref 5–15)
BUN: 23 mg/dL (ref 8–23)
CO2: 22 mmol/L (ref 22–32)
Calcium: 8.6 mg/dL — ABNORMAL LOW (ref 8.9–10.3)
Chloride: 94 mmol/L — ABNORMAL LOW (ref 98–111)
Creatinine: 1.27 mg/dL — ABNORMAL HIGH (ref 0.44–1.00)
GFR, Estimated: 46 mL/min — ABNORMAL LOW (ref >60)
Glucose, Bld: 116 mg/dL — ABNORMAL HIGH (ref 70–99)
Potassium: 4.1 mmol/L (ref 3.5–5.1)
Sodium: 126 mmol/L — ABNORMAL LOW (ref 135–145)
Total Bilirubin: 1 mg/dL (ref 0.3–1.2)
Total Protein: 6.8 g/dL (ref 6.5–8.1)

## 2022-04-21 LAB — CBC WITH DIFFERENTIAL (CANCER CENTER ONLY)
Abs Immature Granulocytes: 0.62 10*3/uL — ABNORMAL HIGH (ref 0.00–0.07)
Basophils Absolute: 0.1 10*3/uL (ref 0.0–0.1)
Basophils Relative: 1 %
Eosinophils Absolute: 0.2 10*3/uL (ref 0.0–0.5)
Eosinophils Relative: 2 %
HCT: 32.6 % — ABNORMAL LOW (ref 36.0–46.0)
Hemoglobin: 11 g/dL — ABNORMAL LOW (ref 12.0–15.0)
Immature Granulocytes: 5 %
Lymphocytes Relative: 9 %
Lymphs Abs: 1.3 10*3/uL (ref 0.7–4.0)
MCH: 28 pg (ref 26.0–34.0)
MCHC: 33.7 g/dL (ref 30.0–36.0)
MCV: 83 fL (ref 80.0–100.0)
Monocytes Absolute: 0.9 10*3/uL (ref 0.1–1.0)
Monocytes Relative: 7 %
Neutro Abs: 10.5 10*3/uL — ABNORMAL HIGH (ref 1.7–7.7)
Neutrophils Relative %: 76 %
Platelet Count: 239 10*3/uL (ref 150–400)
RBC: 3.93 MIL/uL (ref 3.87–5.11)
RDW: 13.8 % (ref 11.5–15.5)
Smear Review: NORMAL
WBC Count: 13.6 10*3/uL — ABNORMAL HIGH (ref 4.0–10.5)
nRBC: 0.3 % — ABNORMAL HIGH (ref 0.0–0.2)

## 2022-04-21 MED ORDER — SODIUM CHLORIDE 0.9 % IV SOLN
10.0000 mg | Freq: Once | INTRAVENOUS | Status: AC
  Administered 2022-04-21: 10 mg via INTRAVENOUS
  Filled 2022-04-21: qty 10

## 2022-04-21 MED ORDER — SODIUM CHLORIDE 0.9 % IV SOLN
2000.0000 mg/m2 | INTRAVENOUS | Status: DC
  Administered 2022-04-21: 2750 mg via INTRAVENOUS
  Filled 2022-04-21: qty 55

## 2022-04-21 MED ORDER — OXALIPLATIN CHEMO INJECTION 100 MG/20ML
70.0000 mg/m2 | Freq: Once | INTRAVENOUS | Status: AC
  Administered 2022-04-21: 100 mg via INTRAVENOUS
  Filled 2022-04-21: qty 20

## 2022-04-21 MED ORDER — DEXTROSE 5 % IV SOLN: Freq: Once | INTRAVENOUS | Status: AC

## 2022-04-21 MED ORDER — SODIUM CHLORIDE 0.9% FLUSH
10.0000 mL | Freq: Once | INTRAVENOUS | Status: AC
  Administered 2022-04-21: 10 mL

## 2022-04-21 MED ORDER — PALONOSETRON HCL INJECTION 0.25 MG/5ML
0.2500 mg | Freq: Once | INTRAVENOUS | Status: AC
  Administered 2022-04-21: 0.25 mg via INTRAVENOUS
  Filled 2022-04-21: qty 5

## 2022-04-21 MED ORDER — LEUCOVORIN CALCIUM INJECTION 350 MG
400.0000 mg/m2 | Freq: Once | INTRAVENOUS | Status: AC
  Administered 2022-04-21: 552 mg via INTRAVENOUS
  Filled 2022-04-21: qty 27.6

## 2022-04-21 MED ORDER — OXYCODONE HCL 5 MG PO TABS: 5.0000 mg | ORAL_TABLET | ORAL | 0 refills | Status: AC | PRN

## 2022-04-21 NOTE — Progress Notes (Signed)
[place]   Telephone:(336) [phone] Fax:(336) [phone]   Clinic Follow up Note   Patient Care Team: Clovia Cuff, MD as PCP - General (Internal Medicine) Truitt Merle, MD as Consulting Physician (Oncology)  Date of Service:  04/21/2022  CHIEF COMPLAINT: f/u of Gastric Cancer   CURRENT THERAPY:  Gastroesophageal Folfox  q14d x 6 cycles    ASSESSMENT:  [name] is a 67 y.o. female with   Gastric cancer ([place]) -stage IV with metastasis to liver and bone -Diagnosed in January 2024.  She underwent EGD which showed a large mass in GE junction and gastric cardia, biopsy confirmed poorly differentiated carcinoma.  Her liver biopsy also showed poorly differentiated carcinoma.  I have reached out to pathologist Dr. Standley Dakins who will compare both liver and gastric mass biopsy to rule out two primary  -Patient understand this is incurable disease, with overall very poor prognosis given the high tumor burden in her low performance status. -Her molecular testing MMR, PD-L1 and HER2 are still pending -Plan to start first-line chemotherapy FOLFOX today -I again reviewed the potential side effect from chemotherapy and management, she agrees to proceed.   PLAN: -lab reviewed, Low Sodium - Recommend Liquid IV -I refill Oxycodone -Proceed with Treatment D1, C1 FOLFOX  at reduce dose due to her low performance status. -lab/flush and f/u and FOLFOX 05/05/2022.  If she tolerated cycle 1 well, will consider full dose at next cycle.  SUMMARY OF ONCOLOGIC HISTORY: Oncology History Overview Note   Cancer Staging  Gastric cancer [facility]) Staging form: Stomach, AJCC 8th Edition - Clinical stage from 04/13/2022: Stage IVB (cTX, cN1, pM1) - Signed by Truitt Merle, MD on 04/21/2022 Stage prefix: Initial diagnosis     Gastric cancer ([place])  04/08/2022 Imaging    IMPRESSION: 1. Irregular masslike mural thickening involving the gastric cardia, consistent with primary gastric malignancy. This  extends to involve the gastroesophageal junction and distal thoracic esophagus. Endoscopy recommended if not previously performed. 2. Stable innumerable liver hypodense masses consistent with hepatic metastatic disease. 3. Pathologic adenopathy within the right paraesophageal region and upper abdomen in the gastrohepatic ligament, consistent with nodal metastatic disease. 4. Diffuse sclerotic lesions throughout the thoracolumbar spine consistent with bony metastatic disease. 5. Mild left lower lobe bronchiectasis and bronchial wall thickening consistent with chronic aspiration or bronchitis. 6. Scattered subpleural ground-glass opacities within the lung apices and left lower lobe, likely inflammatory or infectious. 7.  Aortic Atherosclerosis (ICD10-I70.0).     04/13/2022 Cancer Staging   Staging form: Stomach, AJCC 8th Edition - Clinical stage from 04/13/2022: Stage IVB (cTX, cN1, pM1) - Signed by Truitt Merle, MD on 04/21/2022 Stage prefix: Initial diagnosis   04/15/2022 Initial Diagnosis   Gastric cancer ([place])   04/21/2022 -  Chemotherapy   Patient is on Treatment Plan : GASTROESOPHAGEAL FOLFOX q14d x 6 cycles      Imaging        INTERVAL HISTORY:  [name] is here for a follow up of Gastric Cancer  She was last seen by me on 04/15/2022 She presents to the clinic accompanied by family member. Pt stated she is having a lot of joint pain in her knee. Pt denies having nausea an she is eating but feels full quickly. Pt pain in her stomach wakes her up at night. Pt reports with pain meds her level of pain is at a 5.       All other systems were reviewed with the patient and are negative.  MEDICAL HISTORY:  Past Medical History:  Diagnosis Date   Anal fistula    Arthritis    Coronary atherosclerosis of unspecified type of vessel, native or graft    Crohn's colitis ([place])    Heart murmur    History of colostomy    Hx of myocardial infarction 1986   Personal history of colonic  polyps     SURGICAL HISTORY: Past Surgical History:  Procedure Laterality Date   [place] she thinks   COLOSTOMY  2012   IR IMAGING GUIDED PORT INSERTION  04/13/2022   IR US LIVER BIOPSY  04/13/2022   IRRIGATION AND DEBRIDEMENT ABSCESS N/A 06/18/2013   Procedure: Irrigation and Debridiment of peristomal abcess;  Surgeon: Gayland Curry, MD;  Location: [facility];  Service: General;  Laterality: N/A;   LAPAROTOMY N/A 06/02/2014   Procedure: EXPLORATORY LAPAROTOMY/PARTIAL COLECTOMY COLOSTOMY;  Surgeon: Excell Seltzer, MD;  Location: [facility];  Service: General;  Laterality: N/A;   [place]    I have reviewed the social history and family history with the patient and they are unchanged from previous note.  ALLERGIES:  is allergic to remicade [infliximab].  MEDICATIONS:  Current Outpatient Medications  Medication Sig Dispense Refill   acetaminophen (TYLENOL) 325 MG tablet Take 2 tablets (650 mg total) by mouth every 6 (six) hours as needed for fever, headache, mild pain or moderate pain.     ALPRAZolam (XANAX) 0.25 MG tablet Take 1 tablet (0.25 mg total) by mouth 3 (three) times daily as needed for anxiety. 30 tablet 0   aspirin 81 MG tablet Take 81 mg by mouth daily.       famotidine (PEPCID) 20 MG tablet You can buy this at any drug store or the generic equivalent for use at home.  Discuss with Dr. Olevia Perches on return visist,     lidocaine-prilocaine (EMLA) cream Apply 1 Application topically as needed. 30 g 2   meclizine (ANTIVERT) 25 MG tablet Take 1 tablet by mouth 3 (three) times daily as needed.     omeprazole (PRILOSEC) 40 MG capsule Take 1 capsule every day by oral route for 60 days.     ondansetron (ZOFRAN-ODT) 8 MG disintegrating tablet Take 1 tablet (8 mg total) by mouth every 8 (eight) hours as needed for nausea or vomiting. 60 tablet 0   oxyCODONE (ROXICODONE) 5 MG immediate release tablet  Take 1-2 tablets (5-10 mg total) by mouth every 4 (four) hours as needed for severe pain. 120 tablet 0   prochlorperazine (COMPAZINE) 10 MG tablet Take 1 tablet (10 mg total) by mouth every 6 (six) hours as needed for nausea or vomiting. 30 tablet 0   sucralfate (CARAFATE) 1 GM/10ML suspension Take 1 g by mouth 4 (four) times daily.     No current facility-administered medications for this visit.    PHYSICAL EXAMINATION: ECOG PERFORMANCE STATUS: 2 - Symptomatic, <50% confined to bed  Vitals:   04/21/22 1034  BP: 99/61  Pulse: 95  Resp: 15  Temp: 98.3 F (36.8 C)  SpO2: 99%   Wt Readings from Last 3 Encounters:  04/21/22 95 lb 5 oz (43.2 kg)  04/15/22 99 lb 14.4 oz (45.3 kg)  04/13/22 99 lb (44.9 kg)     GENERAL:alert, no distress and comfortable SKIN: skin color normal, no rashes or significant lesions EYES: normal, Conjunctiva are pink  and non-injected, sclera clear  NEURO: alert & oriented x 3 with fluent speech   LABORATORY DATA:  I have reviewed the data as listed    Latest Ref Rng & Units 04/21/2022   10:04 AM 04/13/2022    7:22 AM 04/04/2022   12:45 PM  CBC  WBC 4.0 - 10.5 K/uL 13.6  12.2  8.7   Hemoglobin 12.0 - 15.0 g/dL 11.0  11.4  12.9   Hematocrit 36.0 - 46.0 % 32.6  33.3  37.8   Platelets 150 - 400 K/uL 239  226  269         Latest Ref Rng & Units 04/21/2022   10:04 AM 04/04/2022   12:45 PM 03/30/2022    3:21 PM  CMP  Glucose 70 - 99 mg/dL 116  104  93   BUN 8 - 23 mg/dL 23  18  14   $ Creatinine 0.44 - 1.00 mg/dL 1.27  1.65  1.23   Sodium 135 - 145 mmol/L 126  126  129   Potassium 3.5 - 5.1 mmol/L 4.1  4.7  4.0   Chloride 98 - 111 mmol/L 94  98  97   CO2 22 - 32 mmol/L 22  19  19   $ Calcium 8.9 - 10.3 mg/dL 8.6  9.1  8.9   Total Protein 6.5 - 8.1 g/dL 6.8  7.4  7.7   Total Bilirubin 0.3 - 1.2 mg/dL 1.0  0.4  0.3   Alkaline Phos 38 - 126 U/L 763  492  383   AST 15 - 41 U/L 83  43  51   ALT 0 - 44 U/L 29  21  26       $ RADIOGRAPHIC STUDIES: I have  personally reviewed the radiological images as listed and agreed with the findings in the report. No results found.    No orders of the defined types were placed in this encounter.  All questions were answered. The patient knows to call the clinic with any problems, questions or concerns. No barriers to learning was detected. The total time spent in the appointment was 30 minutes.     Truitt Merle, MD 04/21/2022   Felicity Coyer, CMA, am acting as scribe for Truitt Merle, MD.   I have reviewed the above documentation for accuracy and completeness, and I agree with the above.

## 2022-04-21 NOTE — Progress Notes (Signed)
Per Dr Burr Medico, ok to treat with AST 83 U/L

## 2022-04-21 NOTE — Assessment & Plan Note (Signed)
-  stage IV with metastasis to liver and bone -Diagnosed in January 2024.  She underwent EGD which showed a large mass in GE junction and gastric cardia, biopsy confirmed poorly differentiated carcinoma.  Her liver biopsy also showed poorly differentiated carcinoma.  I have reached out to pathologist Dr. Standley Dakins who will compare both liver and gastric mass biopsy to rule out two primary  -Patient understand this is incurable disease, with overall very poor prognosis given the high tumor burden in her low performance status. -Her molecular testing MMR, PD-L1 and HER2 are still pending -Plan to start first-line chemotherapy FOLFOX today

## 2022-04-21 NOTE — Patient Instructions (Signed)
[place]  Discharge Instructions: Thank you for choosing [facility] to provide your oncology and hematology care.   If you have a lab appointment with the [facility], please go directly to the [facility] and check in at the registration area.   Wear comfortable clothing and clothing appropriate for easy access to any Portacath or PICC line.   We strive to give you quality time with your provider. You may need to reschedule your appointment if you arrive late (15 or more minutes).  Arriving late affects you and other patients whose appointments are after yours.  Also, if you miss three or more appointments without notifying the office, you may be dismissed from the clinic at the provider's discretion.      For prescription refill requests, have your pharmacy contact our office and allow 72 hours for refills to be completed.    Today you received the following chemotherapy and/or immunotherapy agents: Oxaliplatin and Fluorouracil      To help prevent nausea and vomiting after your treatment, we encourage you to take your nausea medication as directed.  BELOW ARE SYMPTOMS THAT SHOULD BE REPORTED IMMEDIATELY: *FEVER GREATER THAN 100.4 F (38 C) OR HIGHER *CHILLS OR SWEATING *NAUSEA AND VOMITING THAT IS NOT CONTROLLED WITH YOUR NAUSEA MEDICATION *UNUSUAL SHORTNESS OF BREATH *UNUSUAL BRUISING OR BLEEDING *URINARY PROBLEMS (pain or burning when urinating, or frequent urination) *BOWEL PROBLEMS (unusual diarrhea, constipation, pain near the anus) TENDERNESS IN MOUTH AND THROAT WITH OR WITHOUT PRESENCE OF ULCERS (sore throat, sores in mouth, or a toothache) UNUSUAL RASH, SWELLING OR PAIN  UNUSUAL VAGINAL DISCHARGE OR ITCHING   Items with * indicate a potential emergency and should be followed up as soon as possible or go to the Emergency Department if any problems should occur.  Please show the CHEMOTHERAPY ALERT CARD or IMMUNOTHERAPY  ALERT CARD at check-in to the Emergency Department and triage nurse.  Should you have questions after your visit or need to cancel or reschedule your appointment, please contact [facility]  Dept: [phone]  and follow the prompts.  Office hours are 8:00 a.m. to 4:30 p.m. Monday - Friday. Please note that voicemails left after 4:00 p.m. may not be returned until the following business day.  We are closed weekends and major holidays. You have access to a nurse at all times for urgent questions. Please call the main number to the clinic Dept: [phone] and follow the prompts.   For any non-urgent questions, you may also contact your provider using MyChart. We now offer e-Visits for anyone 69 and older to request care online for non-urgent symptoms. For details visit [email or web address].   Also download the MyChart app! Go to the app store, search "MyChart", open the app, select [facility], and log in with your MyChart username and password.  Oxaliplatin Injection What is this medication? OXALIPLATIN (ox AL i PLA tin) treats some types of cancer. It works by slowing down the growth of cancer cells. This medicine may be used for other purposes; ask your health care provider or pharmacist if you have questions. COMMON BRAND NAME(S): Eloxatin What should I tell my care team before I take this medication? They need to know if you have any of these conditions: Heart disease History of irregular heartbeat or rhythm Liver disease Low blood cell levels (white cells, red cells, and platelets) Lung or breathing disease, such as asthma Take medications that treat  or prevent blood clots Tingling of the fingers, toes, or other nerve disorder An unusual or allergic reaction to oxaliplatin, other medications, foods, dyes, or preservatives If you or your partner are pregnant or trying to get pregnant Breast-feeding How should I use this medication? This  medication is injected into a vein. It is given by your care team in a hospital or clinic setting. Talk to your care team about the use of this medication in children. Special care may be needed. Overdosage: If you think you have taken too much of this medicine contact a poison control center or emergency room at once. NOTE: This medicine is only for you. Do not share this medicine with others. What if I miss a dose? Keep appointments for follow-up doses. It is important not to miss a dose. Call your care team if you are unable to keep an appointment. What may interact with this medication? Do not take this medication with any of the following: Cisapride Dronedarone Pimozide Thioridazine This medication may also interact with the following: Aspirin and aspirin-like medications Certain medications that treat or prevent blood clots, such as warfarin, apixaban, dabigatran, and rivaroxaban Cisplatin Cyclosporine Diuretics Medications for infection, such as acyclovir, adefovir, amphotericin B, bacitracin, cidofovir, foscarnet, ganciclovir, gentamicin, pentamidine, vancomycin NSAIDs, medications for pain and inflammation, such as ibuprofen or naproxen Other medications that cause heart rhythm changes Pamidronate Zoledronic acid This list may not describe all possible interactions. Give your health care provider a list of all the medicines, herbs, non-prescription drugs, or dietary supplements you use. Also tell them if you smoke, drink alcohol, or use illegal drugs. Some items may interact with your medicine. What should I watch for while using this medication? Your condition will be monitored carefully while you are receiving this medication. You may need blood work while taking this medication. This medication may make you feel generally unwell. This is not uncommon as chemotherapy can affect healthy cells as well as cancer cells. Report any side effects. Continue your course of treatment even  though you feel ill unless your care team tells you to stop. This medication may increase your risk of getting an infection. Call your care team for advice if you get a fever, chills, sore throat, or other symptoms of a cold or flu. Do not treat yourself. Try to avoid being around people who are sick. Avoid taking medications that contain aspirin, acetaminophen, ibuprofen, naproxen, or ketoprofen unless instructed by your care team. These medications may hide a fever. Be careful brushing or flossing your teeth or using a toothpick because you may get an infection or bleed more easily. If you have any dental work done, tell your dentist you are receiving this medication. This medication can make you more sensitive to cold. Do not drink cold drinks or use ice. Cover exposed skin before coming in contact with cold temperatures or cold objects. When out in cold weather wear warm clothing and cover your mouth and nose to warm the air that goes into your lungs. Tell your care team if you get sensitive to the cold. Talk to your care team if you or your partner are pregnant or think either of you might be pregnant. This medication can cause serious birth defects if taken during pregnancy and for 9 months after the last dose. A negative pregnancy test is required before starting this medication. A reliable form of contraception is recommended while taking this medication and for 9 months after the last dose. Talk to your care  team about effective forms of contraception. Do not father a child while taking this medication and for 6 months after the last dose. Use a condom while having sex during this time period. Do not breastfeed while taking this medication and for 3 months after the last dose. This medication may cause infertility. Talk to your care team if you are concerned about your fertility. What side effects may I notice from receiving this medication? Side effects that you should report to your care team as  soon as possible: Allergic reactions--skin rash, itching, hives, swelling of the face, lips, tongue, or throat Bleeding--bloody or black, tar-like stools, vomiting blood or brown material that looks like coffee grounds, red or dark brown urine, small red or purple spots on skin, unusual bruising or bleeding Dry cough, shortness of breath or trouble breathing Heart rhythm changes--fast or irregular heartbeat, dizziness, feeling faint or lightheaded, chest pain, trouble breathing Infection--fever, chills, cough, sore throat, wounds that don't heal, pain or trouble when passing urine, general feeling of discomfort or being unwell Liver injury--right upper belly pain, loss of appetite, nausea, light-colored stool, dark yellow or brown urine, yellowing skin or eyes, unusual weakness or fatigue Low red blood cell level--unusual weakness or fatigue, dizziness, headache, trouble breathing Muscle injury--unusual weakness or fatigue, muscle pain, dark yellow or brown urine, decrease in amount of urine Pain, tingling, or numbness in the hands or feet Sudden and severe headache, confusion, change in vision, seizures, which may be signs of posterior reversible encephalopathy syndrome (PRES) Unusual bruising or bleeding Side effects that usually do not require medical attention (report to your care team if they continue or are bothersome): Diarrhea Nausea Pain, redness, or swelling with sores inside the mouth or throat Unusual weakness or fatigue Vomiting This list may not describe all possible side effects. Call your doctor for medical advice about side effects. You may report side effects to FDA at 1-800-FDA-1088. Where should I keep my medication? This medication is given in a hospital or clinic. It will not be stored at home. NOTE: This sheet is a summary. It may not cover all possible information. If you have questions about this medicine, talk to your doctor, pharmacist, or health care provider.  2023  Elsevier/Gold Standard (2007-04-14 00:00:00)  Fluorouracil Injection What is this medication? FLUOROURACIL (flure oh YOOR a sil) treats some types of cancer. It works by slowing down the growth of cancer cells. This medicine may be used for other purposes; ask your health care provider or pharmacist if you have questions. COMMON BRAND NAME(S): Adrucil What should I tell my care team before I take this medication? They need to know if you have any of these conditions: Blood disorders Dihydropyrimidine dehydrogenase (DPD) deficiency Infection, such as chickenpox, cold sores, herpes Kidney disease Liver disease Poor nutrition Recent or ongoing radiation therapy An unusual or allergic reaction to fluorouracil, other medications, foods, dyes, or preservatives If you or your partner are pregnant or trying to get pregnant Breast-feeding How should I use this medication? This medication is injected into a vein. It is administered by your care team in a hospital or clinic setting. Talk to your care team about the use of this medication in children. Special care may be needed. Overdosage: If you think you have taken too much of this medicine contact a poison control center or emergency room at once. NOTE: This medicine is only for you. Do not share this medicine with others. What if I miss a dose? Keep appointments for  follow-up doses. It is important not to miss your dose. Call your care team if you are unable to keep an appointment. What may interact with this medication? Do not take this medication with any of the following: Live virus vaccines This medication may also interact with the following: Medications that treat or prevent blood clots, such as warfarin, enoxaparin, dalteparin This list may not describe all possible interactions. Give your health care provider a list of all the medicines, herbs, non-prescription drugs, or dietary supplements you use. Also tell them if you smoke, drink  alcohol, or use illegal drugs. Some items may interact with your medicine. What should I watch for while using this medication? Your condition will be monitored carefully while you are receiving this medication. This medication may make you feel generally unwell. This is not uncommon as chemotherapy can affect healthy cells as well as cancer cells. Report any side effects. Continue your course of treatment even though you feel ill unless your care team tells you to stop. In some cases, you may be given additional medications to help with side effects. Follow all directions for their use. This medication may increase your risk of getting an infection. Call your care team for advice if you get a fever, chills, sore throat, or other symptoms of a cold or flu. Do not treat yourself. Try to avoid being around people who are sick. This medication may increase your risk to bruise or bleed. Call your care team if you notice any unusual bleeding. Be careful brushing or flossing your teeth or using a toothpick because you may get an infection or bleed more easily. If you have any dental work done, tell your dentist you are receiving this medication. Avoid taking medications that contain aspirin, acetaminophen, ibuprofen, naproxen, or ketoprofen unless instructed by your care team. These medications may hide a fever. Do not treat diarrhea with over the counter products. Contact your care team if you have diarrhea that lasts more than 2 days or if it is severe and watery. This medication can make you more sensitive to the sun. Keep out of the sun. If you cannot avoid being in the sun, wear protective clothing and sunscreen. Do not use sun lamps, tanning beds, or tanning booths. Talk to your care team if you or your partner wish to become pregnant or think you might be pregnant. This medication can cause serious birth defects if taken during pregnancy and for 3 months after the last dose. A reliable form of  contraception is recommended while taking this medication and for 3 months after the last dose. Talk to your care team about effective forms of contraception. Do not father a child while taking this medication and for 3 months after the last dose. Use a condom while having sex during this time period. Do not breastfeed while taking this medication. This medication may cause infertility. Talk to your care team if you are concerned about your fertility. What side effects may I notice from receiving this medication? Side effects that you should report to your care team as soon as possible: Allergic reactions--skin rash, itching, hives, swelling of the face, lips, tongue, or throat Heart attack--pain or tightness in the chest, shoulders, arms, or jaw, nausea, shortness of breath, cold or clammy skin, feeling faint or lightheaded Heart failure--shortness of breath, swelling of the ankles, feet, or hands, sudden weight gain, unusual weakness or fatigue Heart rhythm changes--fast or irregular heartbeat, dizziness, feeling faint or lightheaded, chest pain, trouble breathing High  ammonia level--unusual weakness or fatigue, confusion, loss of appetite, nausea, vomiting, seizures Infection--fever, chills, cough, sore throat, wounds that don't heal, pain or trouble when passing urine, general feeling of discomfort or being unwell Low red blood cell level--unusual weakness or fatigue, dizziness, headache, trouble breathing Pain, tingling, or numbness in the hands or feet, muscle weakness, change in vision, confusion or trouble speaking, loss of balance or coordination, trouble walking, seizures Redness, swelling, and blistering of the skin over hands and feet Severe or prolonged diarrhea Unusual bruising or bleeding Side effects that usually do not require medical attention (report to your care team if they continue or are bothersome): Dry skin Headache Increased tears Nausea Pain, redness, or swelling with  sores inside the mouth or throat Sensitivity to light Vomiting This list may not describe all possible side effects. Call your doctor for medical advice about side effects. You may report side effects to FDA at 1-800-FDA-1088. Where should I keep my medication? This medication is given in a hospital or clinic. It will not be stored at home. NOTE: This sheet is a summary. It may not cover all possible information. If you have questions about this medicine, talk to your doctor, pharmacist, or health care provider.  2023 Elsevier/Gold Standard (2021-06-22 00:00:00)  Leucovorin Injection What is this medication? LEUCOVORIN (loo koe VOR in) prevents side effects from certain medications, such as methotrexate. It works by increasing folate levels. This helps protect healthy cells in your body. It may also be used to treat anemia caused by low levels of folate. It can also be used with fluorouracil, a type of chemotherapy, to treat colorectal cancer. It works by increasing the effects of fluorouracil in the body. This medicine may be used for other purposes; ask your health care provider or pharmacist if you have questions. What should I tell my care team before I take this medication? They need to know if you have any of these conditions: Anemia from low levels of vitamin B12 in the blood An unusual or allergic reaction to leucovorin, folic acid, other medications, foods, dyes, or preservatives Pregnant or trying to get pregnant Breastfeeding How should I use this medication? This medication is injected into a vein or a muscle. It is given by your care team in a hospital or clinic setting. Talk to your care team about the use of this medication in children. Special care may be needed. Overdosage: If you think you have taken too much of this medicine contact a poison control center or emergency room at once. NOTE: This medicine is only for you. Do not share this medicine with others. What if I miss  a dose? Keep appointments for follow-up doses. It is important not to miss your dose. Call your care team if you are unable to keep an appointment. What may interact with this medication? Capecitabine Fluorouracil Phenobarbital Phenytoin Primidone Trimethoprim;sulfamethoxazole This list may not describe all possible interactions. Give your health care provider a list of all the medicines, herbs, non-prescription drugs, or dietary supplements you use. Also tell them if you smoke, drink alcohol, or use illegal drugs. Some items may interact with your medicine. What should I watch for while using this medication? Your condition will be monitored carefully while you are receiving this medication. This medication may increase the side effects of 5-fluorouracil. Tell your care team if you have diarrhea or mouth sores that do not get better or that get worse. What side effects may I notice from receiving this medication? Side  effects that you should report to your care team as soon as possible: Allergic reactions--skin rash, itching, hives, swelling of the face, lips, tongue, or throat This list may not describe all possible side effects. Call your doctor for medical advice about side effects. You may report side effects to FDA at 1-800-FDA-1088. Where should I keep my medication? This medication is given in a hospital or clinic. It will not be stored at home. NOTE: This sheet is a summary. It may not cover all possible information. If you have questions about this medicine, talk to your doctor, pharmacist, or health care provider.  2023 Elsevier/Gold Standard (2021-07-27 00:00:00)

## 2022-04-22 ENCOUNTER — Other Ambulatory Visit: Payer: Self-pay

## 2022-04-22 ENCOUNTER — Telehealth: Payer: Self-pay | Admitting: *Deleted

## 2022-04-22 NOTE — Telephone Encounter (Signed)
-----   Message from Charleston Poot, RN sent at 04/21/2022  3:01 PM EST ----- Regarding: first time/ oxaliplatin and fluorouracil/ Dr Burr Medico pt Pt had first time Oxaliplatin and Fluorouracil today. Tolerated well.  Thanks!

## 2022-04-22 NOTE — Telephone Encounter (Signed)
Called & spoke to pt's daughter, Lovena Le who reports that pt denies any problems except feeling weak, "worn down" from her treatment yesterday.  She is eating & drinking fluids OK & knows how to reach [place] if needed.

## 2022-04-23 ENCOUNTER — Other Ambulatory Visit: Payer: Self-pay

## 2022-04-23 ENCOUNTER — Encounter (HOSPITAL_COMMUNITY): Payer: Self-pay

## 2022-04-23 ENCOUNTER — Inpatient Hospital Stay: Payer: Medicare HMO

## 2022-04-23 ENCOUNTER — Emergency Department (HOSPITAL_COMMUNITY): Payer: Medicare HMO

## 2022-04-23 ENCOUNTER — Inpatient Hospital Stay (HOSPITAL_COMMUNITY)
Admission: EM | Admit: 2022-04-23 | Discharge: 2022-04-25 | DRG: 948 | Disposition: A | Discharge status: Home / Self Care | Payer: Medicare HMO | Attending: Family Medicine | Admitting: Family Medicine

## 2022-04-23 VITALS — BP 88/62 | HR 100 | Temp 97.7°F | Resp 16

## 2022-04-23 DIAGNOSIS — G893 Neoplasm related pain (acute) (chronic): Secondary | ICD-10-CM | POA: Diagnosis present

## 2022-04-23 DIAGNOSIS — C7951 Secondary malignant neoplasm of bone: Secondary | ICD-10-CM | POA: Diagnosis present

## 2022-04-23 DIAGNOSIS — I959 Hypotension, unspecified: Secondary | ICD-10-CM | POA: Diagnosis present

## 2022-04-23 DIAGNOSIS — I25110 Atherosclerotic heart disease of native coronary artery with unstable angina pectoris: Secondary | ICD-10-CM | POA: Diagnosis present

## 2022-04-23 DIAGNOSIS — K5010 Crohn's disease of large intestine without complications: Secondary | ICD-10-CM | POA: Diagnosis present

## 2022-04-23 DIAGNOSIS — Z823 Family history of stroke: Secondary | ICD-10-CM

## 2022-04-23 DIAGNOSIS — Z95828 Presence of other vascular implants and grafts: Secondary | ICD-10-CM

## 2022-04-23 DIAGNOSIS — Z9049 Acquired absence of other specified parts of digestive tract: Secondary | ICD-10-CM

## 2022-04-23 DIAGNOSIS — E871 Hypo-osmolality and hyponatremia: Secondary | ICD-10-CM | POA: Diagnosis present

## 2022-04-23 DIAGNOSIS — Z9861 Coronary angioplasty status: Secondary | ICD-10-CM

## 2022-04-23 DIAGNOSIS — C160 Malignant neoplasm of cardia: Secondary | ICD-10-CM

## 2022-04-23 DIAGNOSIS — Z6820 Body mass index (BMI) 20.0-20.9, adult: Secondary | ICD-10-CM

## 2022-04-23 DIAGNOSIS — C169 Malignant neoplasm of stomach, unspecified: Secondary | ICD-10-CM | POA: Diagnosis present

## 2022-04-23 DIAGNOSIS — Z8042 Family history of malignant neoplasm of prostate: Secondary | ICD-10-CM | POA: Diagnosis not present

## 2022-04-23 DIAGNOSIS — C787 Secondary malignant neoplasm of liver and intrahepatic bile duct: Secondary | ICD-10-CM | POA: Diagnosis present

## 2022-04-23 DIAGNOSIS — Z902 Acquired absence of lung [part of]: Secondary | ICD-10-CM

## 2022-04-23 DIAGNOSIS — Z91048 Other nonmedicinal substance allergy status: Secondary | ICD-10-CM

## 2022-04-23 DIAGNOSIS — I252 Old myocardial infarction: Secondary | ICD-10-CM | POA: Diagnosis not present

## 2022-04-23 DIAGNOSIS — E872 Acidosis, unspecified: Secondary | ICD-10-CM | POA: Diagnosis present

## 2022-04-23 DIAGNOSIS — E875 Hyperkalemia: Secondary | ICD-10-CM | POA: Diagnosis present

## 2022-04-23 DIAGNOSIS — Z7982 Long term (current) use of aspirin: Secondary | ICD-10-CM | POA: Diagnosis not present

## 2022-04-23 DIAGNOSIS — Z801 Family history of malignant neoplasm of trachea, bronchus and lung: Secondary | ICD-10-CM

## 2022-04-23 DIAGNOSIS — R748 Abnormal levels of other serum enzymes: Secondary | ICD-10-CM | POA: Diagnosis present

## 2022-04-23 DIAGNOSIS — R570 Cardiogenic shock: Secondary | ICD-10-CM | POA: Diagnosis present

## 2022-04-23 DIAGNOSIS — Z8249 Family history of ischemic heart disease and other diseases of the circulatory system: Secondary | ICD-10-CM

## 2022-04-23 DIAGNOSIS — I2489 Other forms of acute ischemic heart disease: Secondary | ICD-10-CM | POA: Diagnosis present

## 2022-04-23 DIAGNOSIS — E440 Moderate protein-calorie malnutrition: Secondary | ICD-10-CM | POA: Diagnosis present

## 2022-04-23 DIAGNOSIS — E860 Dehydration: Secondary | ICD-10-CM | POA: Diagnosis present

## 2022-04-23 DIAGNOSIS — R7989 Other specified abnormal findings of blood chemistry: Secondary | ICD-10-CM | POA: Diagnosis not present

## 2022-04-23 DIAGNOSIS — Z9221 Personal history of antineoplastic chemotherapy: Secondary | ICD-10-CM

## 2022-04-23 DIAGNOSIS — I429 Cardiomyopathy, unspecified: Secondary | ICD-10-CM | POA: Diagnosis present

## 2022-04-23 DIAGNOSIS — I2510 Atherosclerotic heart disease of native coronary artery without angina pectoris: Secondary | ICD-10-CM | POA: Diagnosis not present

## 2022-04-23 DIAGNOSIS — I214 Non-ST elevation (NSTEMI) myocardial infarction: Secondary | ICD-10-CM | POA: Diagnosis not present

## 2022-04-23 LAB — COMPREHENSIVE METABOLIC PANEL
ALT: 50 U/L — ABNORMAL HIGH (ref 0–44)
AST: 143 U/L — ABNORMAL HIGH (ref 15–41)
Albumin: 3.2 g/dL — ABNORMAL LOW (ref 3.5–5.0)
Alkaline Phosphatase: 655 U/L — ABNORMAL HIGH (ref 38–126)
Anion gap: 15 (ref 5–15)
BUN: 39 mg/dL — ABNORMAL HIGH (ref 8–23)
CO2: 17 mmol/L — ABNORMAL LOW (ref 22–32)
Calcium: 7.7 mg/dL — ABNORMAL LOW (ref 8.9–10.3)
Chloride: 95 mmol/L — ABNORMAL LOW (ref 98–111)
Creatinine, Ser: 1.41 mg/dL — ABNORMAL HIGH (ref 0.44–1.00)
GFR, Estimated: 41 mL/min — ABNORMAL LOW (ref >60)
Glucose, Bld: 106 mg/dL — ABNORMAL HIGH (ref 70–99)
Potassium: 5.2 mmol/L — ABNORMAL HIGH (ref 3.5–5.1)
Sodium: 127 mmol/L — ABNORMAL LOW (ref 135–145)
Total Bilirubin: 1.1 mg/dL (ref 0.3–1.2)
Total Protein: 6.5 g/dL (ref 6.5–8.1)

## 2022-04-23 LAB — LACTIC ACID, PLASMA
Lactic Acid, Venous: 1.8 mmol/L (ref 0.5–1.9)
Lactic Acid, Venous: 1.9 mmol/L (ref 0.5–1.9)

## 2022-04-23 LAB — CBC WITH DIFFERENTIAL/PLATELET
Abs Immature Granulocytes: 0.17 10*3/uL — ABNORMAL HIGH (ref 0.00–0.07)
Basophils Absolute: 0 10*3/uL (ref 0.0–0.1)
Basophils Relative: 0 %
Eosinophils Absolute: 0 10*3/uL (ref 0.0–0.5)
Eosinophils Relative: 0 %
HCT: 32 % — ABNORMAL LOW (ref 36.0–46.0)
Hemoglobin: 10.3 g/dL — ABNORMAL LOW (ref 12.0–15.0)
Immature Granulocytes: 1 %
Lymphocytes Relative: 7 %
Lymphs Abs: 0.9 10*3/uL (ref 0.7–4.0)
MCH: 27.7 pg (ref 26.0–34.0)
MCHC: 32.2 g/dL (ref 30.0–36.0)
MCV: 86 fL (ref 80.0–100.0)
Monocytes Absolute: 0.3 10*3/uL (ref 0.1–1.0)
Monocytes Relative: 2 %
Neutro Abs: 10.9 10*3/uL — ABNORMAL HIGH (ref 1.7–7.7)
Neutrophils Relative %: 90 %
Platelets: 184 10*3/uL (ref 150–400)
RBC: 3.72 MIL/uL — ABNORMAL LOW (ref 3.87–5.11)
RDW: 14.2 % (ref 11.5–15.5)
WBC: 12.3 10*3/uL — ABNORMAL HIGH (ref 4.0–10.5)
nRBC: 0 % (ref 0.0–0.2)

## 2022-04-23 LAB — LIPASE, BLOOD: Lipase: 165 U/L — ABNORMAL HIGH (ref 11–51)

## 2022-04-23 LAB — TROPONIN I (HIGH SENSITIVITY)
Troponin I (High Sensitivity): 5369 ng/L (ref <18)
Troponin I (High Sensitivity): 5220 ng/L (ref <18)

## 2022-04-23 MED ORDER — FENTANYL CITRATE PF 50 MCG/ML IJ SOSY
50.0000 ug | PREFILLED_SYRINGE | INTRAMUSCULAR | Status: DC | PRN
  Administered 2022-04-23 (×2): 50 ug via INTRAVENOUS
  Filled 2022-04-23 (×3): qty 1

## 2022-04-23 MED ORDER — HEPARIN SOD (PORK) LOCK FLUSH 100 UNIT/ML IV SOLN: 500.0000 [IU] | Freq: Once | INTRAVENOUS | Status: DC | PRN

## 2022-04-23 MED ORDER — IOHEXOL 300 MG/ML  SOLN: 100.0000 mL | Freq: Once | INTRAMUSCULAR | Status: DC | PRN

## 2022-04-23 MED ORDER — SODIUM CHLORIDE 0.9 % IV SOLN: Freq: Once | INTRAVENOUS | Status: DC

## 2022-04-23 MED ORDER — ASPIRIN 81 MG PO TBEC
81.0000 mg | DELAYED_RELEASE_TABLET | Freq: Every day | ORAL | Status: DC
  Administered 2022-04-24 – 2022-04-25 (×2): 81 mg via ORAL
  Filled 2022-04-23 (×2): qty 1

## 2022-04-23 MED ORDER — SODIUM CHLORIDE 0.9% FLUSH: 3.0000 mL | INTRAVENOUS | Status: DC | PRN

## 2022-04-23 MED ORDER — ACETAMINOPHEN 325 MG PO TABS: 650.0000 mg | ORAL_TABLET | ORAL | Status: DC | PRN

## 2022-04-23 MED ORDER — SODIUM CHLORIDE 0.9 % IV SOLN: 250.0000 mL | INTRAVENOUS | Status: DC | PRN

## 2022-04-23 MED ORDER — LACTATED RINGERS IV BOLUS
1500.0000 mL | Freq: Once | INTRAVENOUS | Status: AC
  Administered 2022-04-23: 1500 mL via INTRAVENOUS

## 2022-04-23 MED ORDER — UNJURY PLANTED PROTEIN POWDER: 2.0000 [oz_av] | Freq: Four times a day (QID) | ORAL | Status: DC

## 2022-04-23 MED ORDER — SODIUM CHLORIDE 0.9% FLUSH
10.0000 mL | INTRAVENOUS | Status: DC | PRN
  Administered 2022-04-23: 10 mL

## 2022-04-23 MED ORDER — SODIUM CHLORIDE 0.9 % IV SOLN: INTRAVENOUS | Status: DC

## 2022-04-23 MED ORDER — SODIUM CHLORIDE 0.9% FLUSH
10.0000 mL | Freq: Once | INTRAVENOUS | Status: AC
  Administered 2022-04-23: 10 mL

## 2022-04-23 MED ORDER — FENTANYL CITRATE PF 50 MCG/ML IJ SOSY
50.0000 ug | PREFILLED_SYRINGE | Freq: Once | INTRAMUSCULAR | Status: AC
  Administered 2022-04-23: 50 ug via INTRAVENOUS
  Filled 2022-04-23: qty 1

## 2022-04-23 MED ORDER — ASPIRIN 81 MG PO CHEW
324.0000 mg | CHEWABLE_TABLET | Freq: Once | ORAL | Status: AC
  Administered 2022-04-23: 324 mg via ORAL
  Filled 2022-04-23: qty 4

## 2022-04-23 MED ORDER — ONDANSETRON HCL 4 MG/2ML IJ SOLN: 4.0000 mg | Freq: Four times a day (QID) | INTRAMUSCULAR | Status: DC | PRN

## 2022-04-23 MED ORDER — UNJURY CHOCOLATE CLASSIC POWDER: 2.0000 [oz_av] | Freq: Four times a day (QID) | ORAL | Status: DC

## 2022-04-23 MED ORDER — HEPARIN (PORCINE) 25000 UT/250ML-% IV SOLN
1000.0000 [IU]/h | INTRAVENOUS | Status: DC
  Administered 2022-04-23: 600 [IU]/h via INTRAVENOUS
  Filled 2022-04-23 (×2): qty 250

## 2022-04-23 MED ORDER — HEPARIN SOD (PORK) LOCK FLUSH 100 UNIT/ML IV SOLN
500.0000 [IU] | Freq: Once | INTRAVENOUS | Status: AC
  Administered 2022-04-23: 500 [IU]

## 2022-04-23 MED ORDER — IOHEXOL 300 MG/ML  SOLN
100.0000 mL | Freq: Once | INTRAMUSCULAR | Status: AC | PRN
  Administered 2022-04-23: 100 mL via INTRAVENOUS

## 2022-04-23 MED ORDER — SODIUM CHLORIDE 0.9% FLUSH
3.0000 mL | Freq: Two times a day (BID) | INTRAVENOUS | Status: DC
  Administered 2022-04-23: 3 mL via INTRAVENOUS

## 2022-04-23 MED ORDER — SODIUM CHLORIDE 0.9 % IV SOLN: Freq: Once | INTRAVENOUS | Status: AC

## 2022-04-23 MED ORDER — PANTOPRAZOLE SODIUM 40 MG PO TBEC
40.0000 mg | DELAYED_RELEASE_TABLET | Freq: Every day | ORAL | Status: DC
  Administered 2022-04-23 – 2022-04-25 (×3): 40 mg via ORAL
  Filled 2022-04-23 (×3): qty 1

## 2022-04-23 MED ORDER — HEPARIN BOLUS VIA INFUSION
2500.0000 [IU] | Freq: Once | INTRAVENOUS | Status: AC
  Administered 2022-04-23: 2500 [IU] via INTRAVENOUS
  Filled 2022-04-23: qty 2500

## 2022-04-23 NOTE — ED Provider Notes (Signed)
[facility] Provider Note   CSN: [id number] Arrival date & time: 04/23/22  1506     History Chief Complaint  Patient presents with   Abdominal Pain    HPI [name] is a 67 y.o. female presenting for chief complaint of abdominal to epigastric pain.  She states that the pains are present for 2 days.  Extensive medical history recent diagnosis of metastatic lung cancer started chemotherapy last Friday with a Port-A-Cath in place.  Pain started this morning shortly at 3 AM.  She denies fevers or chills, nausea vomiting causing shortness of breath.  Endorses a history of a heart attack 37 years ago.  States she had an angioplasty at that time.  Does not think she had a stent placed.  States she is on aspirin but does not take any other medications for this condition. Denies any chest pain or shortness of breath.  Patient's recorded medical, surgical, social, medication list and allergies were reviewed in the Snapshot window as part of the initial history.   Review of Systems   Review of Systems  Constitutional:  Negative for chills and fever.  HENT:  Negative for ear pain and sore throat.   Eyes:  Negative for pain and visual disturbance.  Respiratory:  Negative for cough and shortness of breath.   Cardiovascular:  Negative for chest pain and palpitations.  Gastrointestinal:  Positive for abdominal pain and nausea. Negative for vomiting.  Genitourinary:  Negative for dysuria and hematuria.  Musculoskeletal:  Negative for arthralgias and back pain.  Skin:  Negative for color change and rash.  Neurological:  Negative for seizures and syncope.  All other systems reviewed and are negative.   Physical Exam Updated Vital Signs BP 95/66   Pulse 93   Temp 97.8 F (36.6 C) (Oral)   Resp (!) 28   Ht 4' 11.5" (1.511 m)   Wt 44 kg   SpO2 94%   BMI 19.26 kg/m  Physical Exam Vitals and nursing note reviewed.  Constitutional:       General: She is not in acute distress.    Appearance: She is well-developed.  HENT:     Head: Normocephalic and atraumatic.  Eyes:     Conjunctiva/sclera: Conjunctivae normal.  Cardiovascular:     Rate and Rhythm: Normal rate and regular rhythm.     Heart sounds: No murmur heard. Pulmonary:     Effort: Pulmonary effort is normal. No respiratory distress.     Breath sounds: Normal breath sounds.  Abdominal:     Palpations: Abdomen is soft.     Tenderness: There is abdominal tenderness in the right lower quadrant and periumbilical area. There is guarding. There is no right CVA tenderness or left CVA tenderness. Negative signs include Murphy's sign.  Musculoskeletal:        General: No swelling.     Cervical back: Neck supple.  Skin:    General: Skin is warm and dry.     Capillary Refill: Capillary refill takes less than 2 seconds.  Neurological:     Mental Status: She is alert.  Psychiatric:        Mood and Affect: Mood normal.      ED Course/ Medical Decision Making/ A&P    Procedures .Critical Care  Performed by: Tretha Sciara, MD Authorized by: Tretha Sciara, MD   Critical care provider statement:    Critical care time (minutes):  94   Critical care was necessary to  treat or prevent imminent or life-threatening deterioration of the following conditions:  Cardiac failure and shock   Critical care was time spent personally by me on the following activities:  Development of treatment plan with patient or surrogate, discussions with consultants, evaluation of patient's response to treatment, examination of patient, ordering and review of laboratory studies, ordering and review of radiographic studies, ordering and performing treatments and interventions, pulse oximetry, re-evaluation of patient's condition and review of old charts    Medications Ordered in ED Medications  fentaNYL (SUBLIMAZE) injection 50 mcg (50 mcg Intravenous Given 04/23/22 2122)  heparin ADULT  infusion 100 units/mL (25000 units/264m) (600 Units/hr Intravenous New Bag/Given 04/23/22 1815)  iohexol (OMNIPAQUE) 300 MG/ML solution 100 mL (has no administration in time range)  aspirin EC tablet 81 mg (has no administration in time range)  pantoprazole (PROTONIX) EC tablet 40 mg (40 mg Oral Given 04/23/22 2116)  sodium chloride flush (NS) 0.9 % injection 3 mL (3 mLs Intravenous Given 04/23/22 2117)  sodium chloride flush (NS) 0.9 % injection 3 mL (has no administration in time range)  0.9 %  sodium chloride infusion (has no administration in time range)  acetaminophen (TYLENOL) tablet 650 mg (has no administration in time range)  ondansetron (ZOFRAN) injection 4 mg (has no administration in time range)  0.9 %  sodium chloride infusion ( Intravenous New Bag/Given 04/23/22 2122)  lactated ringers bolus 1,500 mL (0 mLs Intravenous Stopped 04/23/22 1736)  aspirin chewable tablet 324 mg (324 mg Oral Given 04/23/22 1813)  iohexol (OMNIPAQUE) 300 MG/ML solution 100 mL (100 mLs Intravenous Contrast Given 04/23/22 1751)  heparin bolus via infusion 2,500 Units (2,500 Units Intravenous Bolus from Bag 04/23/22 1815)  fentaNYL (SUBLIMAZE) injection 50 mcg (50 mcg Intravenous Given 04/23/22 2314)    Medical Decision Making:    Martha SULKOWSKIis a 67y.o. female who presented to the ED today with extensive complaints detailed above.     Patient's presentation is complicated by their history of multiple comorbid medical problems.  Patient placed on continuous vitals and telemetry monitoring while in ED which was reviewed periodically.   Complete initial physical exam performed, notably the patient  was hypotensive.  I was emergently called to bedside for intervention.      Reviewed and confirmed nursing documentation for past medical history, family history, social history.    Assessment:   This is a critically ill patient presenting with a chief complaint of hypotension, abdominal pain.  Her pain is  epigastric radiating into her right posterior flank. Initial differential was broad including ACS, pulmonary embolism, pleural effusions, vascular dissection, appendicitis, cholecystitis. Patient was broadly evaluated with screening labs including CBC, CMP, troponin and cross-sectional imaging including CT chest abdomen pelvis with contrast. No evidence of acute pathology on cross-sectional imaging or most of the labs.  However troponin with concern for gross elevation of 5000.  I consulted cardiology.  Started patient on NSTEMI protocol including a heparinization and aspirin and arrange for admission to main campus and joint consultation with hospitalist. Given troponin leak and ongoing symptoms, initial presentation with hypotension, patient will require close care and management.  Patient arranged for admission with no further acute events. Disposition:   Based on the above findings, I believe this patient is stable for admission.    Patient/family educated about specific findings on our evaluation and explained exact reasons for admission.  Patient/family educated about clinical situation and time was allowed to answer questions.   Admission team communicated with  and agreed with need for admission. Patient admitted. Patient ready to move at this time.     Emergency Department Medication Summary:   Medications  fentaNYL (SUBLIMAZE) injection 50 mcg (50 mcg Intravenous Given 04/23/22 2122)  heparin ADULT infusion 100 units/mL (25000 units/250m) (600 Units/hr Intravenous New Bag/Given 04/23/22 1815)  iohexol (OMNIPAQUE) 300 MG/ML solution 100 mL (has no administration in time range)  aspirin EC tablet 81 mg (has no administration in time range)  pantoprazole (PROTONIX) EC tablet 40 mg (40 mg Oral Given 04/23/22 2116)  sodium chloride flush (NS) 0.9 % injection 3 mL (3 mLs Intravenous Given 04/23/22 2117)  sodium chloride flush (NS) 0.9 % injection 3 mL (has no administration in time range)  0.9 %   sodium chloride infusion (has no administration in time range)  acetaminophen (TYLENOL) tablet 650 mg (has no administration in time range)  ondansetron (ZOFRAN) injection 4 mg (has no administration in time range)  0.9 %  sodium chloride infusion ( Intravenous New Bag/Given 04/23/22 2122)  lactated ringers bolus 1,500 mL (0 mLs Intravenous Stopped 04/23/22 1736)  aspirin chewable tablet 324 mg (324 mg Oral Given 04/23/22 1813)  iohexol (OMNIPAQUE) 300 MG/ML solution 100 mL (100 mLs Intravenous Contrast Given 04/23/22 1751)  heparin bolus via infusion 2,500 Units (2,500 Units Intravenous Bolus from Bag 04/23/22 1815)  fentaNYL (SUBLIMAZE) injection 50 mcg (50 mcg Intravenous Given 04/23/22 2314)     Clinical Impression:  1. NSTEMI (non-ST elevated myocardial infarction) [place]      Admit   Final Clinical Impression(s) / ED Diagnoses Final diagnoses:  NSTEMI (non-ST elevated myocardial infarction) ([facility]    Rx / DC Orders ED Discharge Orders     None         CTretha Sciara MD 04/23/22 2323

## 2022-04-23 NOTE — ED Notes (Signed)
Back from CT

## 2022-04-23 NOTE — ED Notes (Addendum)
Not in room, pt has been taken to CT.

## 2022-04-23 NOTE — H&P (Signed)
History and Physical    [name] [id number] DOB: October 25, 1955 DOA: 04/23/2022  PCP: Clovia Cuff, MD   Chief Complaint: abd pain  HPI: [name] is a 67 y.o. female with medical history significant of Crohn's colitis status post colectomy, metastatic gastric cancer, CAD status post angioplasty who presented to the emergency department due to abdominal pain.  Patient is followed at oncology for stage IV gastric adenocarcinoma.  Originally diagnosed in January and she had an EGD which showed a large mass at [place] junction she was started on FOLFOX chemotherapy 3 days ago and had a continuous infusion at home.  She states that she went to clinic to return her chemotherapy infusion and she was found to be hypotensive.  At that time she was given IV fluids and labs were obtained that was stable from prior.  She went home and family states she has been especially weak and immobile.  She can walk around her house with assistance.  At 3 AM on 2/17 she woke up with profuse abdominal pain around her ileostomy she said it was severe and unrelenting and she can feel a palpable nodule.  The pain was lasting intermittently throughout the day.  She presented to the ER for further assessment.  On arrival to emergency department she was hypotensive with systolics in the 0000000 and tachycardic in the 100s.  She is afebrile.  She was volume resuscitated with improvement.  Low obtained which revealed lactic acid 1.8, WBC 12.3, hemoglobin 10.3, sodium 127, potassium 5.2, bicarb 17, creatinine 1.4, AST 143, ALT 50, alkaline phosphatase 655, lipase 165.  Troponin 5369, blood cultures were obtained which are pending.  Patient was started heparin drip, loaded with aspirin cardiology was consulted who recommended transfer to [facility] for consideration of heart catheterization.  On assessing the patient she was resting comfortably in bed.  Her pain is focused around her ileostomy site and is reproducible to palpation.   She does not have any evidence of cardiac chest pain.  She states that her pain is nonexertional and she has not had any shortness of breath.  No radiation down the arm.  She has not had any nitroglycerin.  She reports remote history of angioplasty over 30 years ago.   Review of Systems: Review of Systems  All other systems reviewed and are negative.    As per HPI otherwise 10 point review of systems negative.   Allergies  Allergen Reactions   Remicade [Infliximab]     convulsions    Past Medical History:  Diagnosis Date   Anal fistula    Arthritis    Coronary atherosclerosis of unspecified type of vessel, native or graft    Crohn's colitis ([place])    Heart murmur    History of colostomy    Hx of myocardial infarction 1986   Personal history of colonic polyps     Past Surgical History:  Procedure Laterality Date   [place] she thinks   COLOSTOMY  2012   IR IMAGING GUIDED PORT INSERTION  04/13/2022   IR US LIVER BIOPSY  04/13/2022   IRRIGATION AND DEBRIDEMENT ABSCESS N/A 06/18/2013   Procedure: Irrigation and Debridiment of peristomal abcess;  Surgeon: Gayland Curry, MD;  Location: [facility] ORS;  Service: General;  Laterality: N/A;   LAPAROTOMY N/A 06/02/2014   Procedure: EXPLORATORY LAPAROTOMY/PARTIAL COLECTOMY COLOSTOMY;  Surgeon: Excell Seltzer, MD;  Location: [facility] ORS;  Service: General;  Laterality: N/A;   [place]     reports that she has been smoking cigarettes. She has a 25.00 pack-year smoking history. She has never used smokeless tobacco. She reports current alcohol use. She reports that she does not use drugs.  Family History  Problem Relation Age of Onset   Heart disease Mother    Stroke Father    Heart disease Brother    Prostate cancer Brother    Lung cancer Paternal Uncle    Colon polyps Neg Hx    Esophageal cancer Neg Hx    Rectal cancer Neg Hx    Stomach cancer Neg  Hx     Prior to Admission medications   Medication Sig Start Date End Date Taking? Authorizing Provider  acetaminophen (TYLENOL) 325 MG tablet Take 2 tablets (650 mg total) by mouth every 6 (six) hours as needed for fever, headache, mild pain or moderate pain. 06/23/14   Earnstine Regal, PA-C  ALPRAZolam Duanne Moron) 0.25 MG tablet Take 1 tablet (0.25 mg total) by mouth 3 (three) times daily as needed for anxiety. 04/15/22   Truitt Merle, MD  aspirin 81 MG tablet Take 81 mg by mouth daily.      [provider]  famotidine (PEPCID) 20 MG tablet You can buy this at any drug store or the generic equivalent for use at home.  Discuss with Dr. Olevia Perches on return visist, 06/23/14   Earnstine Regal, PA-C  lidocaine-prilocaine (EMLA) cream Apply 1 Application topically as needed. 04/15/22   Truitt Merle, MD  meclizine (ANTIVERT) 25 MG tablet Take 1 tablet by mouth 3 (three) times daily as needed.    [provider]  omeprazole (PRILOSEC) 40 MG capsule Take 1 capsule every day by oral route for 60 days. 03/22/22   [provider]  ondansetron (ZOFRAN-ODT) 8 MG disintegrating tablet Take 1 tablet (8 mg total) by mouth every 8 (eight) hours as needed for nausea or vomiting. 04/05/22   Dede Query T, PA-C  oxyCODONE (ROXICODONE) 5 MG immediate release tablet Take 1-2 tablets (5-10 mg total) by mouth every 4 (four) hours as needed for severe pain. 04/21/22   Truitt Merle, MD  prochlorperazine (COMPAZINE) 10 MG tablet Take 1 tablet (10 mg total) by mouth every 6 (six) hours as needed for nausea or vomiting. 04/15/22   Truitt Merle, MD  sucralfate (CARAFATE) 1 GM/10ML suspension Take 1 g by mouth 4 (four) times daily.    [provider]    Physical Exam: Vitals:   04/23/22 1813 04/23/22 1815 04/23/22 1900 04/23/22 1933  BP: 107/68 105/65 101/63   Pulse: (!) 109 (!) 110 99   Resp: (!) 39 (!) 40 (!) 23   Temp:    97.8 F (36.6 C)  TempSrc:    Oral  SpO2: 95% 94% 92%   Weight:      Height:        Physical Exam Constitutional:      Appearance: She is normal weight.  Cardiovascular:     Rate and Rhythm: Tachycardia present.     Heart sounds: Normal heart sounds.  Pulmonary:     Effort: Pulmonary effort is normal. No respiratory distress.     Breath sounds: Normal breath sounds.  Abdominal:     General: Abdomen is flat. There is no distension.     Palpations: Abdomen is soft. There is mass.     Tenderness: There is abdominal  tenderness in the right upper quadrant.  Skin:    General: Skin is warm.  Neurological:     General: No focal deficit present.     Mental Status: She is alert and oriented to person, place, and time.  Psychiatric:        Mood and Affect: Mood normal.        Labs on Admission: I have personally reviewed the patients's labs and imaging studies.  Assessment/Plan  # Abdominal pain - Patient's abdominal pain may be related to metastatic cancer and/or Crohn's disease.  She had a colectomy however certainly could have small bowel Crohn's disease.  She is currently not on any treatment for her Crohn's disease. - CT scan without any evidence of active making malignant pain most likely etiology Plan: Continue fentanyl for breakthrough pain.   Obtain inflammatory markers including ESR, CRP and fecal calprotectin  # Type II NSTEMI - Patient does not any chest pain elevated troponin over 5000 with history of CAD -Most likely related to demand ischemia Plan: - Continue heparin drip Continue aspirin Admit to [facility] for cardiac evaluation Obtain echocardiogram N.p.o. at midnight in the event of cath however suspicion of acute coronary syndrome is extremely low  Metastatic gastric adenocarcinoma-mets to liver, bone.  Will continue supportive treatment with fentanyl and Zofran  # Crohn's disease status post colectomy.  History of lung resections in 1988 as well as total colectomy in 2012.  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  Continue IV fluids   Admission status: Inpatient Telemetry Cardiac  Certification: The appropriate patient status for this patient is INPATIENT. Inpatient status is judged to be reasonable and necessary in order to provide the required intensity of service to ensure the patient's safety. The patient's presenting symptoms, physical exam findings, and initial radiographic and laboratory data in the context of their chronic comorbidities is felt to place them at high risk for further clinical deterioration. Furthermore, it is not anticipated that the patient will be medically stable for discharge from the hospital within 2 midnights of admission.   * I certify that at the point of admission it is my clinical judgment that the patient will require inpatient hospital care spanning beyond 2 midnights from the point of admission due to high intensity of service, high risk for further deterioration and high frequency of surveillance required.Emilee Hero MD [facility] If 7PM-7AM, please contact night-coverage [email or web address]  04/23/2022, 8:10 PM

## 2022-04-23 NOTE — ED Notes (Signed)
Daughter leaving BS. PT remains alert, NAD, calm, interactive. Discussed NPO and no "just wetting my mouth".

## 2022-04-23 NOTE — Progress Notes (Signed)
[place] for IV heparin Indication: chest pain/ACS  Allergies  Allergen Reactions   Remicade [Infliximab]     convulsions    Patient Measurements: Height: 4' 11.5" (151.1 cm) Weight: 44 kg (97 lb) IBW/kg (Calculated) : 44.35 Heparin Dosing Weight: TBW  Vital Signs: Temp: 97.9 F (36.6 C) (02/17 1520) Temp Source: Oral (02/17 1520) BP: 94/63 (02/17 1730) Pulse Rate: 104 (02/17 1730)  Labs: Recent Labs    04/21/22 1004 04/23/22 1550  HGB 11.0* 10.3*  HCT 32.6* 32.0*  PLT 239 184  CREATININE 1.27* 1.41*  TROPONINIHS  --  5,369*    Estimated Creatinine Clearance: 26.9 mL/min (A) (by C-G formula based on SCr of 1.41 mg/dL (H)).   Medical History: Past Medical History:  Diagnosis Date   Anal fistula    Arthritis    Coronary atherosclerosis of unspecified type of vessel, native or graft    Crohn's colitis ([place])    Heart murmur    History of colostomy    Hx of myocardial infarction 1986   Personal history of colonic polyps     Medications:  (Not in a hospital admission)  Scheduled:   aspirin  324 mg Oral Once   heparin  2,500 Units Intravenous Once   PRN: fentaNYL (SUBLIMAZE) injection, iohexol  Assessment: 68 yoF with PMH CAD (remote history MI in her 30's); recent Dx gastric cancer and first dose of FOLFOX on 2/15; presented to [facility] for 5FU pump removal but was sent to [facility] ED with hypotension and severe abdominal pain. In ED, found to have markedly elevated troponins > 5k. Pharmacy consulted to dose IV heparin for ACS.  Baseline INR, aPTT: not done Prior anticoagulation: none, ASA 81 mg only  Significant events:  Today, 04/23/2022: CBC: Hgb low; consistent with previous (did receive chemo infusion the past 3 days); Plt WNL SCr elevated; no recent baseline for comparison (lowest recent value 1.23 in Jan) No bleeding or infusion issues per nursing  Goal of Therapy: Heparin level 0.3-0.7 units/ml Monitor platelets  by anticoagulation protocol: Yes  Plan: Heparin 2500 units IV bolus x 1 Heparin 600 units/hr IV infusion Check heparin level 6-8 hrs after start Daily CBC, daily heparin level once stable Monitor for signs of bleeding or thrombosis  Reuel Boom, PharmD, BCPS [phone] 04/23/2022, 5:46 PM

## 2022-04-23 NOTE — ED Triage Notes (Addendum)
States that she started chemo on Thursday also has chemo infusing at home (chemo nurse states that she needs everything given to her at room tempeture as the chemo makes her hypersensitive to the cold) . Went to get it removed from the cancer center today, and this morning around 3am she started having abdominal pain.

## 2022-04-23 NOTE — ED Notes (Signed)
EDP at [facility]. Family at [facility].

## 2022-04-23 NOTE — Progress Notes (Signed)
Pt presented today for pump d/c. Pt reported being in severe pain in her abdomen area. Bp found to be 73/56, 72/54, 68/50, on call physician paged. Pt denies chest pain/SOB/dizziness. Pt states she has been having on going stomach pain. Last oxycodone 63m taken at home at 1200p. Pt daughter stated that the pt has not been eating or drinking much recently, reports only a few sips of soup or water every now and then.  Dr EMarin Olpordered 1L NS and recheck bp found to be 88/62. Pt reports she is feeling better although she is still having on going stomach pain. Pt states she would like to go to the ED for assessment and management of the stomach pain. Wheelchair to ED upon discharge from infusion room.

## 2022-04-23 NOTE — Patient Instructions (Signed)
Dehydration, Adult Dehydration is a condition in which there is not enough water or other fluids in the body. This happens when a person loses more fluids than he or she takes in. Important organs, such as the kidneys, brain, and heart, cannot function without a proper amount of fluids. Any loss of fluids from the body can lead to dehydration. Dehydration can be mild, moderate, or severe. It should be treated right away to prevent it from becoming severe. What are the causes? Dehydration may be caused by: Conditions that cause loss of water or other fluids, such as diarrhea, vomiting, or sweating or urinating a lot. Not drinking enough fluids, especially when you are ill or doing activities that require a lot of energy. Other illnesses and conditions, such as fever or infection. Certain medicines, such as medicines that remove excess fluid from the body (diuretics). Lack of safe drinking water. Not being able to get enough water and food. What increases the risk? The following factors may make you more likely to develop this condition: Having a long-term (chronic) illness that has not been treated properly, such as diabetes, heart disease, or kidney disease. Being 65 years of age or older. Having a disability. Living in a place that is high in altitude, where thinner, drier air causes more fluid loss. Doing exercises that put stress on your body for a long time (endurance sports). What are the signs or symptoms? Symptoms of dehydration depend on how severe it is. Mild or moderate dehydration Thirst. Dry lips or dry mouth. Dizziness or light-headedness, especially when standing up from a seated position. Muscle cramps. Dark urine. Urine may be the color of tea. Less urine or tears produced than usual. Headache. Severe dehydration Changes in skin. Your skin may be cold and clammy, blotchy, or pale. Your skin also may not return to normal after being lightly pinched and released. Little or  no tears, urine, or sweat. Changes in vital signs, such as rapid breathing and low blood pressure. Your pulse may be weak or may be faster than 100 beats a minute when you are sitting still. Other changes, such as: Feeling very thirsty. Sunken eyes. Cold hands and feet. Confusion. Being very tired (lethargic) or having trouble waking from sleep. Short-term weight loss. Loss of consciousness. How is this diagnosed? This condition is diagnosed based on your symptoms and a physical exam. You may have blood and urine tests to help confirm the diagnosis. How is this treated? Treatment for this condition depends on how severe it is. Treatment should be started right away. Do not wait until dehydration becomes severe. Severe dehydration is an emergency and needs to be treated in a hospital. Mild or moderate dehydration can be treated at home. You may be asked to: Drink more fluids. Drink an oral rehydration solution (ORS). This drink helps restore proper amounts of fluids and salts and minerals in the blood (electrolytes). Severe dehydration can be treated: With IV fluids. By correcting abnormal levels of electrolytes. This is often done by giving electrolytes through a tube that is passed through your nose and into your stomach (nasogastric tube, or NG tube). By treating the underlying cause of dehydration. Follow these instructions at home: Oral rehydration solution If told by your health care provider, drink an ORS: Make an ORS by following instructions on the package. Start by drinking small amounts, about  cup (120 mL) every 5-10 minutes. Slowly increase how much you drink until you have taken the amount recommended by your health   care provider. Eating and drinking        Drink enough clear fluid to keep your urine pale yellow. If you were told to drink an ORS, finish the ORS first and then start slowly drinking other clear fluids. Drink fluids such as: Water. Do not drink only  water. Doing that can lead to hyponatremia, which is having too little salt (sodium) in the body. Water from ice chips you suck on. Fruit juice that you have added water to (diluted fruit juice). Low-calorie sports drinks. Eat foods that contain a healthy balance of electrolytes, such as bananas, oranges, potatoes, tomatoes, and spinach. Do not drink alcohol. Avoid the following: Drinks that contain a lot of sugar. These include high-calorie sports drinks, fruit juice that is not diluted, and soda. Caffeine. Foods that are greasy or contain a lot of fat or sugar. General instructions Take over-the-counter and prescription medicines only as told by your health care provider. Do not take sodium tablets. Doing that can lead to having too much sodium in the body (hypernatremia). Return to your normal activities as told by your health care provider. Ask your health care provider what activities are safe for you. Keep all follow-up visits as told by your health care provider. This is important. Contact a health care provider if: You have muscle cramps, pain, or discomfort, such as: Pain in your abdomen and the pain gets worse or stays in one area (localizes). Stiff neck. You have a rash. You are more irritable than usual. You are sleepier or have a harder time waking than usual. You feel weak or dizzy. You feel very thirsty. Get help right away if you have: Any symptoms of severe dehydration. Symptoms of vomiting, such as: You cannot eat or drink without vomiting. Vomiting gets worse or does not go away. Vomit includes blood or green matter (bile). Symptoms that get worse with treatment. A fever. A severe headache. Problems with urination or bowel movements, such as: Diarrhea that gets worse or does not go away. Blood in your stool (feces). This may cause stool to look black and tarry. Not urinating, or urinating only a small amount of very dark urine, within 6-8 hours. Trouble  breathing. These symptoms may represent a serious problem that is an emergency. Do not wait to see if the symptoms will go away. Get medical help right away. Call your local emergency services (911 in the [place] Do not drive yourself to the hospital. Summary Dehydration is a condition in which there is not enough water or other fluids in the body. This happens when a person loses more fluids than he or she takes in. Treatment for this condition depends on how severe it is. Treatment should be started right away. Do not wait until dehydration becomes severe. Drink enough clear fluid to keep your urine pale yellow. If you were told to drink an oral rehydration solution (ORS), finish the ORS first and then start slowly drinking other clear fluids. Take over-the-counter and prescription medicines only as told by your health care provider. Get help right away if you have any symptoms of severe dehydration. This information is not intended to replace advice given to you by your health care provider. Make sure you discuss any questions you have with your health care provider. Document Revised: 06/30/2021 Document Reviewed: 10/04/2018 Elsevier Patient Education  2023 [employer]  

## 2022-04-24 ENCOUNTER — Inpatient Hospital Stay (HOSPITAL_COMMUNITY): Payer: Medicare HMO

## 2022-04-24 DIAGNOSIS — I214 Non-ST elevation (NSTEMI) myocardial infarction: Secondary | ICD-10-CM | POA: Diagnosis not present

## 2022-04-24 DIAGNOSIS — I2510 Atherosclerotic heart disease of native coronary artery without angina pectoris: Secondary | ICD-10-CM | POA: Diagnosis not present

## 2022-04-24 DIAGNOSIS — R7989 Other specified abnormal findings of blood chemistry: Secondary | ICD-10-CM | POA: Diagnosis not present

## 2022-04-24 LAB — SEDIMENTATION RATE: Sed Rate: 46 mm/hr — ABNORMAL HIGH (ref 0–22)

## 2022-04-24 LAB — CBC
HCT: 29.6 % — ABNORMAL LOW (ref 36.0–46.0)
Hemoglobin: 9.4 g/dL — ABNORMAL LOW (ref 12.0–15.0)
MCH: 27.1 pg (ref 26.0–34.0)
MCHC: 31.8 g/dL (ref 30.0–36.0)
MCV: 85.3 fL (ref 80.0–100.0)
Platelets: 175 10*3/uL (ref 150–400)
RBC: 3.47 MIL/uL — ABNORMAL LOW (ref 3.87–5.11)
RDW: 14.3 % (ref 11.5–15.5)
WBC: 9.6 10*3/uL (ref 4.0–10.5)
nRBC: 0 % (ref 0.0–0.2)

## 2022-04-24 LAB — ECHOCARDIOGRAM COMPLETE
Area-P 1/2: 4.24 cm2
Calc EF: 31.2 %
Height: 60 in
MV M vel: 4.78 m/s
MV Peak grad: 91.4 mmHg
Radius: 0.4 cm
S' Lateral: 3.8 cm
Single Plane A2C EF: 32.4 %
Single Plane A4C EF: 27.4 %
Weight: 1603.2 oz

## 2022-04-24 LAB — CREATININE, SERUM
Creatinine, Ser: 1.4 mg/dL — ABNORMAL HIGH (ref 0.44–1.00)
GFR, Estimated: 41 mL/min — ABNORMAL LOW (ref >60)

## 2022-04-24 LAB — C-REACTIVE PROTEIN: CRP: 16.5 mg/dL — ABNORMAL HIGH (ref <1.0)

## 2022-04-24 LAB — BASIC METABOLIC PANEL
Anion gap: 11 (ref 5–15)
BUN: 40 mg/dL — ABNORMAL HIGH (ref 8–23)
CO2: 16 mmol/L — ABNORMAL LOW (ref 22–32)
Calcium: 7.6 mg/dL — ABNORMAL LOW (ref 8.9–10.3)
Chloride: 100 mmol/L (ref 98–111)
Creatinine, Ser: 1.38 mg/dL — ABNORMAL HIGH (ref 0.44–1.00)
GFR, Estimated: 42 mL/min — ABNORMAL LOW (ref >60)
Glucose, Bld: 102 mg/dL — ABNORMAL HIGH (ref 70–99)
Potassium: 5.5 mmol/L — ABNORMAL HIGH (ref 3.5–5.1)
Sodium: 127 mmol/L — ABNORMAL LOW (ref 135–145)

## 2022-04-24 LAB — HEPARIN LEVEL (UNFRACTIONATED)
Heparin Unfractionated: 0.23 IU/mL — ABNORMAL LOW (ref 0.30–0.70)
Heparin Unfractionated: 0.21 IU/mL — ABNORMAL LOW (ref 0.30–0.70)
Heparin Unfractionated: 0.24 IU/mL — ABNORMAL LOW (ref 0.30–0.70)

## 2022-04-24 MED ORDER — FENTANYL CITRATE PF 50 MCG/ML IJ SOSY
12.5000 ug | PREFILLED_SYRINGE | INTRAMUSCULAR | Status: DC | PRN
  Administered 2022-04-24: 12.5 ug via INTRAVENOUS
  Filled 2022-04-24: qty 1

## 2022-04-24 MED ORDER — OXYCODONE HCL 5 MG PO TABS
5.0000 mg | ORAL_TABLET | ORAL | Status: DC | PRN
  Administered 2022-04-24 – 2022-04-25 (×6): 10 mg via ORAL
  Filled 2022-04-24 (×6): qty 2

## 2022-04-24 MED ORDER — SODIUM ZIRCONIUM CYCLOSILICATE 5 G PO PACK
5.0000 g | PACK | Freq: Two times a day (BID) | ORAL | Status: AC
  Administered 2022-04-24 – 2022-04-25 (×2): 5 g via ORAL
  Filled 2022-04-24 (×2): qty 1

## 2022-04-24 MED ORDER — PERFLUTREN LIPID MICROSPHERE
1.0000 mL | INTRAVENOUS | Status: AC | PRN
  Administered 2022-04-24: 2 mL via INTRAVENOUS

## 2022-04-24 MED ORDER — CHLORHEXIDINE GLUCONATE CLOTH 2 % EX PADS
6.0000 | MEDICATED_PAD | Freq: Every day | CUTANEOUS | Status: DC
  Administered 2022-04-25: 6 via TOPICAL

## 2022-04-24 MED ORDER — NALOXONE HCL 0.4 MG/ML IJ SOLN: 0.4000 mg | INTRAMUSCULAR | Status: DC | PRN

## 2022-04-24 MED ORDER — OXYCODONE HCL 5 MG PO TABS
5.0000 mg | ORAL_TABLET | Freq: Four times a day (QID) | ORAL | Status: DC | PRN
  Administered 2022-04-24 (×2): 10 mg via ORAL
  Filled 2022-04-24: qty 1
  Filled 2022-04-24: qty 2
  Filled 2022-04-24: qty 1

## 2022-04-24 NOTE — Progress Notes (Signed)
   04/24/22 0218  Assess: MEWS Score  BP 96/61  Pulse Rate 98  Assess: MEWS Score  MEWS Temp 0  MEWS Systolic 1  MEWS Pulse 0  MEWS RR 1  MEWS LOC 0  MEWS Score 2  MEWS Score Color Yellow  Assess: SIRS CRITERIA  SIRS Temperature  0  SIRS Pulse 1  SIRS Respirations  1  SIRS WBC 0  SIRS Score Sum  2

## 2022-04-24 NOTE — Progress Notes (Signed)
   04/23/22 2350  Assess: MEWS Score  Temp (!) 97.5 F (36.4 C)  BP 100/67  Pulse Rate (!) 103  Resp (!) 22  SpO2 98 %  O2 Device Room Air  Assess: MEWS Score  MEWS Temp 0  MEWS Systolic 1  MEWS Pulse 1  MEWS RR 1  MEWS LOC 0  MEWS Score 3  MEWS Score Color Yellow  Assess: if the MEWS score is Yellow or Red  Were vital signs taken at a resting state? Yes  Focused Assessment No change from prior assessment  Does the patient meet 2 or more of the SIRS criteria? No  MEWS guidelines implemented  No, previously yellow, continue vital signs every 4 hours  Assess: SIRS CRITERIA  SIRS Temperature  0  SIRS Pulse 1  SIRS Respirations  1  SIRS WBC 1  SIRS Score Sum  3

## 2022-04-24 NOTE — Plan of Care (Signed)
  Problem: Skin Integrity: Goal: Risk for impaired skin integrity will decrease Outcome: Completed/Met   

## 2022-04-24 NOTE — Progress Notes (Signed)
[facility]  PROGRESS NOTE  [name] [id number] DOB: 05-10-1955 DOA: 04/23/2022 PCP: Clovia Cuff, MD   Brief HPI:   67 year old female with history of Crohn's colitis s/p colectomy, metastatic gastric cancer, CAD s/p angioplasty came to ED with abdominal pain.  Patient seen by oncology for stage IV gastric adenocarcinoma.  Diagnosed in January, had an EGD which showed large mass at GE junction, started on FOLFOX chemotherapy 3 days ago.  Patient says she went to clinic to get her chemotherapy infusion and was found to be hypotensive.  She was sent to ED for further evaluation.  In the ED she was found to be hypotensive with SBP in 60s and tachycardic.  Troponin was elevated 5,369.  Patient was started on heparin drip. Cardiology was consulted.    Subjective   Patient seen and examined, continues to have abdominal pain.  CT abdomen/pelvis was unremarkable except for osseous metastasis involving lumbar spine as well as proximal femurs bilaterally.   Assessment/Plan:    Abdominal pain -Likely in setting of metastasis from gastric adenocarcinoma and chronic disease -CT scan shows osseous metastasis involving lumbar spine and bilateral femurs -Patient takes oxycodone 5 mg 1 to 2 tablets every 4 hours as needed at home; will continue with home regimen -Will notify oncology Dr. 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  SpO2: 93 %    Vitals:   04/24/22 0001 04/24/22 0218 04/24/22 0516 04/24/22 1110  BP:  96/61 (!) 93/58 105/65  Pulse:  98 93 94  Resp:   20 18  Temp:   (!) 97.1 F (36.2 C) 98.1 F (36.7 C)  TempSrc:   Oral Oral  SpO2:   95% 93%  Weight: 45.5 kg     Height: 5' (1.524 m)         Data Reviewed:  Basic Metabolic Panel: Recent Labs  Lab 04/21/22 1004 04/23/22 1550 04/24/22 0054  NA 126* 127*  --   K 4.1 5.2*  --   CL 94* 95*  --   CO2 22 17*  --   GLUCOSE 116* 106*  --   BUN 23 39*  --   CREATININE 1.27* 1.41* 1.40*  CALCIUM 8.6* 7.7*  --     CBC: Recent Labs  Lab 04/21/22 1004 04/23/22 1550 04/24/22 0054  WBC 13.6* 12.3* 9.6  NEUTROABS 10.5* 10.9*  --   HGB 11.0* 10.3* 9.4*  HCT 32.6* 32.0* 29.6*  MCV 83.0 86.0 85.3  PLT 239 184 175    LFT Recent  Labs  Lab 04/21/22 1004 04/23/22 1550  AST 83* 143*  ALT 29 50*  ALKPHOS 763* 655*  BILITOT 1.0 1.1  PROT 6.8 6.5  ALBUMIN 3.4* 3.2*     Antibiotics: Anti-infectives (From admission, onward)    None        DVT prophylaxis: Heparin  Code Status: Full code  Family Communication: No family at bedside   CONSULTS cardiology   Objective    Physical Examination:  General-appears in no acute distress Heart-S1-S2, regular, no murmur auscultated Lungs-clear to auscultation bilaterally, no wheezing or crackles auscultated Abdomen-soft, nontender, no organomegaly Extremities-no edema in the lower extremities Neuro-alert, oriented x3, no focal  deficit noted  Status is: Inpatient: Abdominal pain       [place]   [facility] If 7PM-7AM, please contact night-coverage at [email or web address], Office  [phone]   04/24/2022, 3:27 PM  LOS: 1 day

## 2022-04-24 NOTE — Progress Notes (Addendum)
[facility] for IV heparin Indication: chest pain/ACS  Allergies  Allergen Reactions   Remicade [Infliximab]     convulsions    Patient Measurements: Height: 5' (152.4 cm) Weight: 45.5 kg (100 lb 3.2 oz) IBW/kg (Calculated) : 45.5 Heparin Dosing Weight: 45.5 kg  Vital Signs: Temp: 97.1 F (36.2 C) (02/18 0516) Temp Source: Oral (02/18 0516) BP: 93/58 (02/18 0516) Pulse Rate: 93 (02/18 0516)  Labs: Recent Labs    04/23/22 1550 04/23/22 1740 04/24/22 0054 04/24/22 1000  HGB 10.3*  --  9.4*  --   HCT 32.0*  --  29.6*  --   PLT 184  --  175  --   HEPARINUNFRC  --   --  0.23* 0.21*  CREATININE 1.41*  --  1.40*  --   TROPONINIHS 5,369* 5,220*  --   --      Estimated Creatinine Clearance: 28 mL/min (A) (by C-G formula based on SCr of 1.4 mg/dL (H)).   Medical History: Past Medical History:  Diagnosis Date   Anal fistula    Arthritis    Coronary atherosclerosis of unspecified type of vessel, native or graft    Crohn's colitis ([place])    Heart murmur    History of colostomy    Hx of myocardial infarction 1986   Personal history of colonic polyps     Medications:  Medications Prior to Admission  Medication Sig Dispense Refill Last Dose   acetaminophen (TYLENOL) 325 MG tablet Take 2 tablets (650 mg total) by mouth every 6 (six) hours as needed for fever, headache, mild pain or moderate pain. (Patient taking differently: Take 650 mg by mouth every 6 (six) hours as needed for fever, headache or mild pain.)   unknown   ALPRAZolam (XANAX) 0.25 MG tablet Take 1 tablet (0.25 mg total) by mouth 3 (three) times daily as needed for anxiety. 30 tablet 0 04/22/2022   aspirin 81 MG tablet Take 81 mg by mouth daily.     04/23/2022   famotidine (PEPCID) 20 MG tablet You can buy this at any drug store or the generic equivalent for use at home.  Discuss with Dr. Olevia Perches on return visist, (Patient taking differently: Take 20 mg by mouth daily.)   Past  Month   lidocaine-prilocaine (EMLA) cream Apply 1 Application topically as needed. (Patient taking differently: Apply 1 Application topically daily as needed (for pain).) 30 g 2 unknown   meclizine (ANTIVERT) 25 MG tablet Take 1 tablet by mouth 3 (three) times daily as needed for dizziness.   04/23/2022   Melatonin 10 MG TABS Take 1 tablet by mouth daily as needed (For sleep).   04/22/2022   omeprazole (PRILOSEC) 40 MG capsule Take 40 mg by mouth daily.   04/23/2022   ondansetron (ZOFRAN-ODT) 8 MG disintegrating tablet Take 1 tablet (8 mg total) by mouth every 8 (eight) hours as needed for nausea or vomiting. 60 tablet 0 Past Week   oxyCODONE (ROXICODONE) 5 MG immediate release tablet Take 1-2 tablets (5-10 mg total) by mouth every 4 (four) hours as needed for severe pain. (Patient taking differently: Take 5-10 mg by mouth every 4 (four) hours as needed for moderate pain.) 120 tablet 0 04/23/2022   sucralfate (CARAFATE) 1 GM/10ML suspension Take 1 g by mouth 4 (four) times daily.   Past Week   prochlorperazine (COMPAZINE) 10 MG tablet Take 1 tablet (10 mg total) by mouth every 6 (six) hours as needed for nausea or vomiting. [place]  tablet 0 unknown   Scheduled:   aspirin EC  81 mg Oral Daily   pantoprazole  40 mg Oral Daily   sodium chloride flush  3 mL Intravenous Q12H   PRN: sodium chloride, acetaminophen, fentaNYL (SUBLIMAZE) injection, iohexol, naLOXone (NARCAN)  injection, ondansetron (ZOFRAN) IV, oxyCODONE, sodium chloride flush  Assessment: 49 yoF with PMH CAD (remote history MI in her 30's); recent Dx gastric cancer and first dose of FOLFOX on 2/15; presented to [facility] for 5FU pump removal but was sent to [facility] ED with hypotension and severe abdominal pain. In ED, found to have markedly elevated troponins > 5k. No anticoag PTA. Pharmacy consulted to dose IV heparin for ACS.  Heparin level today remains subtherapeutic at 0.21, on 700 units/hr. Hgb 9.4, plt 175--stable. No line issues or signs/symptoms  of bleeding per nurse.  Goal of Therapy: Heparin level 0.3-0.7 units/ml Monitor platelets by anticoagulation protocol: Yes  Plan: Increase heparin gtt to 800 units/hr  Check heparin level in 8hrs Monitor heparin level, CBC, s/sx of bleeding daily  Plan for heparin gtt x48hrs per cardiology    Billey Gosling, PharmD PGY1 Pharmacy Resident 2/18/202411:09 AM

## 2022-04-24 NOTE — Progress Notes (Signed)
ANTICOAGULATION CONSULT NOTE - Follow Up Consult  Pharmacy Consult for heparin Indication:  elevated troponin  Labs: Recent Labs    04/21/22 1004 04/23/22 1550 04/23/22 1740 04/24/22 0054  HGB 11.0* 10.3*  --  9.4*  HCT 32.6* 32.0*  --  29.6*  PLT 239 184  --  175  HEPARINUNFRC  --   --   --  0.23*  CREATININE 1.27* 1.41*  --  1.40*  TROPONINIHS  --  5,369* 5,220*  --     Assessment: 66yo female subtherapeutic on heparin with initial dosing for elevated troponin; no infusion issues or signs of bleeding per RN.  Goal of Therapy:  Heparin level 0.3-0.7 units/ml   Plan:  Will increase heparin infusion by 2 units/kg/hr to 700 units/hr and check level in 8 hours.    Wynona Neat, PharmD, BCPS  04/24/2022,2:46 AM

## 2022-04-24 NOTE — Progress Notes (Signed)
ANTICOAGULATION CONSULT NOTE  Pharmacy Consult for IV heparin Indication: chest pain/ACS  Allergies  Allergen Reactions   Remicade [Infliximab]     convulsions    Patient Measurements: Height: 5' (152.4 cm) Weight: 45.5 kg (100 lb 3.2 oz) IBW/kg (Calculated) : 45.5  Vital Signs: Temp: 97.8 F (36.6 C) (02/18 1929) Temp Source: Oral (02/18 1929) BP: 95/65 (02/18 1929) Pulse Rate: 102 (02/18 1929)  Labs: Recent Labs    04/23/22 1550 04/23/22 1740 04/24/22 0054 04/24/22 1000 04/24/22 1621 04/24/22 2142  HGB 10.3*  --  9.4*  --   --   --   HCT 32.0*  --  29.6*  --   --   --   PLT 184  --  175  --   --   --   HEPARINUNFRC  --   --  0.23* 0.21*  --  0.24*  CREATININE 1.41*  --  1.40*  --  1.38*  --   TROPONINIHS 5,369* 5,220*  --   --   --   --      Estimated Creatinine Clearance: 28.4 mL/min (A) (by C-G formula based on SCr of 1.38 mg/dL (H)).    Assessment: 58 yoF with PMH CAD (remote history MI in her 69's); recent Dx gastric cancer and first dose of FOLFOX on 2/15; presented to [facility] for 5FU pump removal but was sent to [facility] ED with hypotension and severe abdominal pain. In ED, found to have markedly elevated troponins > 5k. No anticoag PTA. Pharmacy consulted to dose IV heparin for ACS.  Heparin level remains subtherapeutic (0.24) on infusion at 800 units/hr. No issues with line or bleeding reported per RN.  Goal of Therapy: Heparin level 0.3-0.7 units/ml Monitor platelets by anticoagulation protocol: Yes  Plan: Increase heparin gtt to 900 units/hr  Check heparin level in 8hrs Plan for heparin gtt x48hrs per cardiology (this would be 2/19 1800)  Sherlon Handing, PharmD, BCPS Please see [email or web address] for complete clinical pharmacist phone list 2/18/202410:38 PM

## 2022-04-24 NOTE — Consult Note (Addendum)
Cardiology Consultation   Patient ID: STAYSHA FEITH MRN: [id number]; DOB: [birth year]  Admit date: 04/23/2022 Date of Consult: 04/24/2022  PCP:  Clovia Cuff, MD   [facility] Cardiologist:  None        Patient Profile:   [name] is a 67 y.o. female with a hx of CAD s/p PCI 1987, Crohn's disease, metastatic gastric cancer who is being seen 04/24/2022 for the evaluation of elevated troponin.  History of Present Illness:   [name] is a 34F with CAD s/p PCI 1987, Crohn's disease, metastatic gastric cancer who presented with abdominal pain around the ileostomy site. She presented to the ED for evaluation.  She has not had any chest pain, dyspnea, orthopnea, or PND. The abdominal pain is RLQ near the site of the ileostomy. She has had low blood pressures during chemo and has received IV fluids during the sessions to address this. Her BP on admission was okay.  Troponin 5369 - 5220. EKG shows LVH with early repol, with TWI in lateral leads that are old, but does have more pronounced ST depressions in lateral leads.    Past Medical History:  Diagnosis Date   Anal fistula    Arthritis    Coronary atherosclerosis of unspecified type of vessel, native or graft    Crohn's colitis ([place])    Heart murmur    History of colostomy    Hx of myocardial infarction 1986   Personal history of colonic polyps     Past Surgical History:  Procedure Laterality Date   [place] she thinks   COLOSTOMY  2012   IR IMAGING GUIDED PORT INSERTION  04/13/2022   IR US LIVER BIOPSY  04/13/2022   IRRIGATION AND DEBRIDEMENT ABSCESS N/A 06/18/2013   Procedure: Irrigation and Debridiment of peristomal abcess;  Surgeon: Gayland Curry, MD;  Location: [facility];  Service: General;  Laterality: N/A;   LAPAROTOMY N/A 06/02/2014   Procedure: EXPLORATORY LAPAROTOMY/PARTIAL COLECTOMY COLOSTOMY;  Surgeon: Excell Seltzer, MD;  Location: [facility];  Service: General;  Laterality: N/A;   TUBAL LIGATION  1993     Home Medications:  Prior to Admission medications   Medication Sig Start Date End Date Taking? Authorizing Provider  acetaminophen (TYLENOL) 325 MG tablet Take 2 tablets (650 mg total) by mouth every 6 (six) hours as needed for fever, headache, mild pain or moderate pain. Patient taking differently: Take 650 mg by mouth every 6 (six) hours as needed for fever, headache or mild pain. 06/23/14  Yes Earnstine Regal, PA-C  ALPRAZolam Duanne Moron) 0.25 MG tablet Take 1 tablet (0.25 mg total) by mouth 3 (three) times daily as needed for anxiety. 04/15/22  Yes Truitt Merle, MD  aspirin 81 MG tablet Take 81 mg by mouth daily.     Yes [provider]  famotidine (PEPCID) 20 MG tablet You can buy this at any drug store or the generic equivalent for use at home.  Discuss with Dr. Olevia Perches on return visist, Patient taking differently: Take 20 mg by mouth daily. 06/23/14  Yes Earnstine Regal, PA-C  lidocaine-prilocaine (EMLA) cream Apply 1 Application topically as needed. Patient taking differently: Apply 1 Application topically daily as needed (for pain). 04/15/22  Yes Truitt Merle, MD  meclizine (ANTIVERT) 25 MG tablet Take 1 tablet by mouth 3 (three) times daily as needed for  dizziness.   Yes [provider]  Melatonin 10 MG TABS Take 1 tablet by mouth daily as needed (For sleep).   Yes [provider]  omeprazole (PRILOSEC) 40 MG capsule Take 40 mg by mouth daily. 03/22/22  Yes [provider]  ondansetron (ZOFRAN-ODT) 8 MG disintegrating tablet Take 1 tablet (8 mg total) by mouth every 8 (eight) hours as needed for nausea or vomiting. 04/05/22  Yes Dede Query T, PA-C  oxyCODONE (ROXICODONE) 5 MG immediate release tablet Take 1-2 tablets (5-10 mg total) by mouth every 4 (four) hours as needed for severe pain. Patient taking differently: Take 5-10 mg by mouth every 4 (four) hours as needed  for moderate pain. 04/21/22  Yes Truitt Merle, MD  sucralfate (CARAFATE) 1 GM/10ML suspension Take 1 g by mouth 4 (four) times daily.   Yes [provider]  prochlorperazine (COMPAZINE) 10 MG tablet Take 1 tablet (10 mg total) by mouth every 6 (six) hours as needed for nausea or vomiting. 04/15/22   Truitt Merle, MD    Inpatient Medications: Scheduled Meds:  aspirin EC  81 mg Oral Daily   pantoprazole  40 mg Oral Daily   sodium chloride flush  3 mL Intravenous Q12H   Continuous Infusions:  sodium chloride     sodium chloride 100 mL/hr at 04/23/22 2122   heparin 600 Units/hr (04/23/22 1815)   PRN Meds: sodium chloride, acetaminophen, fentaNYL (SUBLIMAZE) injection, iohexol, ondansetron (ZOFRAN) IV, sodium chloride flush  Allergies:    Allergies  Allergen Reactions   Remicade [Infliximab]     convulsions    Social History:   Social History   Socioeconomic History   Marital status: Divorced    Spouse name: Not on file   Number of children: 1   Years of education: Not on file   Highest education level: Not on file  Occupational History   Occupation: disabled    Employer: DISABLED  Tobacco Use   Smoking status: Every Day    Packs/day: 0.50    Years: 50.00    Total pack years: 25.00    Types: Cigarettes   Smokeless tobacco: Never  Substance and Sexual Activity   Alcohol use: Yes    Comment: sparingly, for holidays   Drug use: No   Sexual activity: Not on file  Other Topics Concern   Not on file  Social History Narrative   One daughter age 109- Has PTSD, lives with patient   Divorced   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   Completed some college   Enjoys cleaning, spending time with family   Social Determinants of Health   Financial Resource Strain: High Risk (04/15/2022)   Overall Financial Resource Strain (CARDIA)    Difficulty of Paying Living Expenses: Very hard  Food Insecurity: No Food Insecurity (04/04/2022)   Hunger Vital Sign    Worried About  Running Out of Food in the Last Year: Never true    [place] in the Last Year: Never true  Transportation Needs: No Transportation Needs (04/04/2022)   PRAPARE - Hydrologist (Medical): No    Lack of Transportation (Non-Medical): No  Physical Activity: Not on file  Stress: Not on file  Social Connections: Not on file  Intimate Partner Violence: Not At Risk (04/04/2022)   Humiliation, Afraid, Rape, and Kick questionnaire    Fear of Current or Ex-Partner: No    Emotionally Abused: No    Physically Abused: No  Sexually Abused: No    Family History:   Family History  Problem Relation Age of Onset   Heart disease Mother    Stroke Father    Heart disease Brother    Prostate cancer Brother    Lung cancer Paternal Uncle    Colon polyps Neg Hx    Esophageal cancer Neg Hx    Rectal cancer Neg Hx    Stomach cancer Neg Hx      ROS:  Please see the history of present illness.  All other ROS reviewed and negative.     Physical Exam/Data:   Vitals:   04/23/22 2200 04/23/22 2230 04/23/22 2350 04/24/22 0001  BP: 98/65 95/66 100/67   Pulse: 95 93 (!) 103   Resp: (!) 25 (!) 28 (!) 22   Temp:   (!) 97.5 F (36.4 C)   TempSrc:   Oral   SpO2: 94% 94% 98%   Weight:    45.5 kg  Height:    5' (1.524 m)    Intake/Output Summary (Last 24 hours) at 04/24/2022 0136 Last data filed at 04/23/2022 1736 Gross per 24 hour  Intake 1500 ml  Output --  Net 1500 ml      04/24/2022   12:01 AM 04/23/2022    3:25 PM 04/21/2022   11:00 AM  Last 3 Weights  Weight (lbs) 100 lb 3.2 oz 97 lb 95 lb 5 oz  Weight (kg) 45.45 kg 44 kg 43.233 kg     Body mass index is 19.57 kg/m.  General:  No acute distress HEENT: normal Neck: no JVD Vascular: Distal pulses 2+ bilaterally Cardiac:  normal S1, S2; RRR; no murmur  Lungs:  bilateral mild crackles, no rhonchi or rales  Abd: soft, nontender, no hepatomegaly  Ext: no edema Musculoskeletal:  No deformities Skin: warm  and dry  Neuro:  No focal abnormalities noted Psych:  Normal affect    Laboratory Data:  High Sensitivity Troponin:   Recent Labs  Lab 04/23/22 1550 04/23/22 1740  TROPONINIHS 5,369* 5,220*     Chemistry Recent Labs  Lab 04/21/22 1004 04/23/22 1550  NA 126* 127*  K 4.1 5.2*  CL 94* 95*  CO2 22 17*  GLUCOSE 116* 106*  BUN 23 39*  CREATININE 1.27* 1.41*  CALCIUM 8.6* 7.7*  GFRNONAA 46* 41*  ANIONGAP 10 15    Recent Labs  Lab 04/21/22 1004 04/23/22 1550  PROT 6.8 6.5  ALBUMIN 3.4* 3.2*  AST 83* 143*  ALT 29 50*  ALKPHOS 763* 655*  BILITOT 1.0 1.1   Lipids No results for input(s): "CHOL", "TRIG", "HDL", "LABVLDL", "LDLCALC", "CHOLHDL" in the last 168 hours.  Hematology Recent Labs  Lab 04/21/22 1004 04/23/22 1550  WBC 13.6* 12.3*  RBC 3.93 3.72*  HGB 11.0* 10.3*  HCT 32.6* 32.0*  MCV 83.0 86.0  MCH 28.0 27.7  MCHC 33.7 32.2  RDW 13.8 14.2  PLT 239 184   Thyroid No results for input(s): "TSH", "FREET4" in the last 168 hours.  BNPNo results for input(s): "BNP", "PROBNP" in the last 168 hours.  DDimer No results for input(s): "DDIMER" in the last 168 hours.   Radiology/Studies:  CT CHEST ABDOMEN PELVIS W CONTRAST  Result Date: 04/23/2022 CLINICAL DATA:  Chest and abdominal pain, history of recent liver biopsy EXAM: CT CHEST, ABDOMEN, AND PELVIS WITH CONTRAST TECHNIQUE: Multidetector CT imaging of the chest, abdomen and pelvis was performed following the standard protocol during bolus administration of intravenous contrast. RADIATION DOSE REDUCTION: This  exam was performed according to the departmental dose-optimization program which includes automated exposure control, adjustment of the mA and/or kV according to patient size and/or use of iterative reconstruction technique. CONTRAST:  146m OMNIPAQUE IOHEXOL 300 MG/ML  SOLN COMPARISON:  CT from 04/08/2022 and 03/30/2022 FINDINGS: CT CHEST FINDINGS Cardiovascular: Atherosclerotic calcifications of the thoracic  aorta are again identified. Coronary calcifications are seen. Heart is mildly enlarged in size. No large central pulmonary embolus is identified although timing was not performed for embolus evaluation. Heavy calcification at the origin of the left subclavian and innominate artery are seen stable from the prior exam. Right-sided chest wall port is noted in satisfactory position. Mediastinum/Nodes: Thoracic inlet is within normal limits. No hilar or mediastinal adenopathy is noted. The esophagus is within normal limits. There is again noted thickening of the gastroesophageal junction and extending into the stomach consistent with gastric neoplasm and stable from the prior exam. Lungs/Pleura: Lungs are well aerated bilaterally. There again noted areas of ground-glass airspace opacity stable from the prior study. Some more marked consolidation is noted in the left base consistent with worsening atelectasis. No confluent infiltrate is seen. Similar findings are noted in the right lower lobe as well. No sizable effusion is seen. Some interstitial thickening is noted which may represent some underlying edema. Musculoskeletal: No acute rib abnormality is noted. Multiple sclerotic foci are noted throughout the thoracic spine consistent with metastatic disease. No definitive compression deformity is seen. Old healed sternal fracture is noted. CT ABDOMEN PELVIS FINDINGS Hepatobiliary: There again noted multiple hypodense and peripherally enhancing lesions throughout the liver similar to that seen on prior CT of the abdomen and pelvis. Given some slight variation of the technique the overall appearance is stable. Gallbladder has been surgically removed. Pancreas: Unremarkable. No pancreatic ductal dilatation or surrounding inflammatory changes. Spleen: Normal in size without focal abnormality. Adrenals/Urinary Tract: Adrenal glands appear within normal limits. Kidneys demonstrate a normal enhancement pattern bilaterally. No  renal calculi or obstructive changes are seen. Delayed images demonstrate normal enhancement. The bladder is well distended. Stomach/Bowel: Right lower quadrant ileostomy is noted. No small bowel obstructive changes are seen. The stomach again demonstrates enhancing wall thickening at the gastroesophageal junction and extending into the gastric cardia consistent with the known history of gastric neoplasm. Vascular/Lymphatic: Aortic atherosclerosis. Persistent lymphadenopathy adjacent to the GE junction and gastrohepatic ligament is seen with central necrosis. No other significant adenopathy is noted. Reproductive: Uterus has been surgically removed. No adnexal mass is seen. Other: Persistent presacral fluid collection is noted stable from the most recent exam. Free fluid is noted in the abdomen new from the prior study Musculoskeletal: Scattered sclerotic foci are noted throughout the visualized bony structures consistent with metastatic disease. These are most prominent within the lumbar spine as well as in the proximal femurs bilaterally. IMPRESSION: CT of the chest: Changes consistent with osseous metastatic disease. Stable ground-glass airspace opacities are noted unchanged from the prior exam again likely infectious or inflammatory in nature. Increasing lower lobe atelectatic changes bilaterally as well as some changes of mild interstitial edema. CT of the abdomen and pelvis: Changes consistent with the known history of gastric neoplasm involving the gastroesophageal junction and gastric cardia. Multiple hepatic and osseous metastatic lesions are again noted and stable. Stable lymphadenopathy adjacent to the gastroesophageal junction is seen. New free fluid is noted within the abdomen likely related to the known neoplastic disease. Stable appearing right lower quadrant ileostomy. Stable presacral fluid collection of uncertain significance. This may be postoperative  in nature and chronic. Electronically Signed    By: Inez Catalina M.D.   On: 04/23/2022 18:24     Assessment and Plan:  ARAIA LEDOUX is a 67 y.o. female with a hx of CAD s/p PCI 1987, Crohn's disease, metastatic gastric cancer who is being seen 04/24/2022 for the evaluation of elevated troponin.  Elevated troponin - likely supply demand - continue aspirin - heparin empirically x48 hrs - troponin has peaked at 5300 - obtain TTE   Risk Assessment/Risk Scores:     TIMI Risk Score for Unstable Angina or Non-ST Elevation MI:   The patient's TIMI risk score is  , which indicates a  % risk of all cause mortality, new or recurrent myocardial infarction or need for urgent revascularization in the next 14 days.          For questions or updates, please contact [place] Please consult [email or web address] for contact info under    Signed, Georgette Shell  04/24/2022 1:36 AM  Attending Addendum:  History and all data above reviewed.  Patient examined.  I agree with the findings as above.  [name] is a 85F with CAD s/p remote PCI at age 59, Crohn's disease, metastatic gastric cancer admitted with abdominal pain.  Cardiology consulted for elevated troponin to 5,369.  All available labs, radiology testing, previous records reviewed. Agree with documented assessment and plan.   # CAD status post PCI: # Elevated troponin: Presented with abdominal pain.  She has not had any chest pain or exertional dyspnea.  She recently started chemotherapy 1 week ago.  She presented with abdominal pain around her stoma.  CRP 16.5 and sed rate 46.  She had a leukocytosis of 13.6.  She was hypotensive to the 60s.  Overall, her picture was consistent with demand ischemia in the setting of sepsis.  We will get an echocardiogram today to assess.  She reports a family history of premature CAD and had PCI in her 81s.  Since that time she has not had any cardiac issues.  Given her active treatment of metastatic gastric cancer, no plan for any invasive workup at  this time unless she develops chest pain or there are substantial findings on her echo.  Continue aspirin.  Heparin x48 hours.n Check fasting lipids.   Mervil Wacker C. Oval Linsey, MD, [facility]  04/24/2022 10:33 AM

## 2022-04-24 NOTE — Progress Notes (Signed)
Echocardiogram 2D Echocardiogram has been performed.  Oneal Deputy Siddiq Kaluzny RDCS 04/24/2022, 3:46 PM

## 2022-04-25 DIAGNOSIS — I429 Cardiomyopathy, unspecified: Secondary | ICD-10-CM | POA: Diagnosis not present

## 2022-04-25 DIAGNOSIS — I214 Non-ST elevation (NSTEMI) myocardial infarction: Secondary | ICD-10-CM | POA: Diagnosis not present

## 2022-04-25 DIAGNOSIS — R570 Cardiogenic shock: Secondary | ICD-10-CM | POA: Diagnosis not present

## 2022-04-25 LAB — BASIC METABOLIC PANEL
Anion gap: 12 (ref 5–15)
BUN: 44 mg/dL — ABNORMAL HIGH (ref 8–23)
CO2: 15 mmol/L — ABNORMAL LOW (ref 22–32)
Calcium: 7.8 mg/dL — ABNORMAL LOW (ref 8.9–10.3)
Chloride: 102 mmol/L (ref 98–111)
Creatinine, Ser: 1.49 mg/dL — ABNORMAL HIGH (ref 0.44–1.00)
GFR, Estimated: 38 mL/min — ABNORMAL LOW (ref >60)
Glucose, Bld: 123 mg/dL — ABNORMAL HIGH (ref 70–99)
Potassium: 5 mmol/L (ref 3.5–5.1)
Sodium: 129 mmol/L — ABNORMAL LOW (ref 135–145)

## 2022-04-25 LAB — HEPARIN LEVEL (UNFRACTIONATED): Heparin Unfractionated: 0.26 IU/mL — ABNORMAL LOW (ref 0.30–0.70)

## 2022-04-25 LAB — CBC
HCT: 27.6 % — ABNORMAL LOW (ref 36.0–46.0)
Hemoglobin: 9.2 g/dL — ABNORMAL LOW (ref 12.0–15.0)
MCH: 28.5 pg (ref 26.0–34.0)
MCHC: 33.3 g/dL (ref 30.0–36.0)
MCV: 85.4 fL (ref 80.0–100.0)
Platelets: 189 10*3/uL (ref 150–400)
RBC: 3.23 MIL/uL — ABNORMAL LOW (ref 3.87–5.11)
RDW: 14.4 % (ref 11.5–15.5)
WBC: 8.4 10*3/uL (ref 4.0–10.5)
nRBC: 0.7 % — ABNORMAL HIGH (ref 0.0–0.2)

## 2022-04-25 LAB — LIPID PANEL
Cholesterol: 115 mg/dL (ref 0–200)
HDL: 27 mg/dL — ABNORMAL LOW (ref >40)
LDL Cholesterol: 69 mg/dL (ref 0–99)
Total CHOL/HDL Ratio: 4.3 RATIO
Triglycerides: 96 mg/dL (ref <150)
VLDL: 19 mg/dL (ref 0–40)

## 2022-04-25 MED ORDER — ENSURE ENLIVE PO LIQD
237.0000 mL | Freq: Two times a day (BID) | ORAL | Status: DC
  Administered 2022-04-25: 237 mL via ORAL

## 2022-04-25 MED ORDER — RESOURCE INSTANT PROTEIN PO PWD PACKET
1.0000 | Freq: Three times a day (TID) | ORAL | Status: DC
  Filled 2022-04-25: qty 6

## 2022-04-25 MED ORDER — BENEPROTEIN PO POWD
1.0000 | Freq: Three times a day (TID) | ORAL | Status: DC
  Administered 2022-04-25: 6 g via ORAL
  Filled 2022-04-25: qty 227

## 2022-04-25 MED ORDER — CARVEDILOL 3.125 MG PO TABS
3.1250 mg | ORAL_TABLET | Freq: Two times a day (BID) | ORAL | Status: DC
  Administered 2022-04-25: 3.125 mg via ORAL
  Filled 2022-04-25: qty 1

## 2022-04-25 MED ORDER — CARVEDILOL 3.125 MG PO TABS: 3.1250 mg | ORAL_TABLET | Freq: Two times a day (BID) | ORAL | 2 refills | Status: AC

## 2022-04-25 MED ORDER — HEPARIN SOD (PORK) LOCK FLUSH 100 UNIT/ML IV SOLN
500.0000 [IU] | INTRAVENOUS | Status: AC | PRN
  Administered 2022-04-25: 500 [IU]

## 2022-04-25 MED ORDER — ADULT MULTIVITAMIN W/MINERALS CH
1.0000 | ORAL_TABLET | Freq: Every day | ORAL | Status: DC
  Administered 2022-04-25: 1 via ORAL
  Filled 2022-04-25: qty 1

## 2022-04-25 MED ORDER — HEPARIN SODIUM (PORCINE) 5000 UNIT/ML IJ SOLN
5000.0000 [IU] | Freq: Three times a day (TID) | INTRAMUSCULAR | Status: DC
  Administered 2022-04-25: 5000 [IU] via SUBCUTANEOUS
  Filled 2022-04-25: qty 1

## 2022-04-25 NOTE — Discharge Summary (Signed)
Physician Discharge Summary   Patient: [name] MRN: [id number] DOB: 05-Oct-1955  Admit date:     04/23/2022  Discharge date: 04/25/22  Discharge Physician: Oswald Hillock   PCP: Clovia Cuff, MD   Recommendations at discharge:   Follow-up cardiology in 2 weeks Follow-up oncology in 1 week  Discharge Diagnoses: Principal Problem: Cardiomyopathy Elevated troponin Abdominal pain  Resolved Problems:   * No resolved hospital problems. *  Hospital Course:  66 year old female with history of Crohn's colitis s/p colectomy, metastatic gastric cancer, CAD s/p angioplasty came to ED with abdominal pain.  Patient seen by oncology for stage IV gastric adenocarcinoma.  Diagnosed in January, had an EGD which showed large mass at GE junction, started on FOLFOX chemotherapy 3 days ago.  Patient says she went to clinic to get her chemotherapy infusion and was found to be hypotensive.  She was sent to ED for further evaluation.  In the ED she was found to be hypotensive with SBP in 60s and tachycardic.  Troponin was elevated 5,369.  Patient was started on heparin drip. Cardiology was consulted.    Assessment and Plan:  Abdominal pain -Likely in setting of metastasis from gastric adenocarcinoma and chronic disease -CT scan shows osseous metastasis involving lumbar spine and bilateral femurs -Patient takes oxycodone 5 mg 1 to 2 tablets every 4 hours as needed at home; will continue with home regimen -Discussed with Dr. Burr Medico, she will see patient in the clinic in 1 week.   -Alk phos elevated in 700s, likely in setting of bony metastasis   Elevated troponin -Patient had troponin elevation of 5369 -Felt to be demand ischemia as per cardiology due to hypotension and tachycardia -Echocardiogram showed EF 25 to 30% with global hypokinesis -Continue aspirin -Heparin discontinued after 48 hours -No aggressive intervention recommended per cardiology  Cardiomyopathy --Echocardiogram showed EF 25  to 30% with global hypokinesis -Cardiology recommends supportive care and no aggressive treatment   Metastatic gastric adenocarcinoma -Patient has mets to liver and bone -Continue supportive treatment with oxycodone, Zofran   Crohn's disease s/p colectomy -History of lung resection Xenadrine T8 as well as total colectomy in 0000000 -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   Hyponatremia -Chronic -Sodium improved to 129 -At baseline   Mild hyperkalemia -Replete       Consultants: Cardiology Procedures performed:  Disposition: Home Diet recommendation:  Discharge Diet Orders (From admission, onward)     Start     Ordered   04/25/22 0000  Diet - low sodium heart healthy        04/25/22 1609           Cardiac diet DISCHARGE MEDICATION: Allergies as of 04/25/2022       Reactions   Remicade [infliximab]    convulsions        Medication List     TAKE these medications    acetaminophen 325 MG tablet Commonly known as: TYLENOL Take 2 tablets (650 mg total) by mouth every 6 (six) hours as needed for fever, headache, mild pain or moderate pain. What changed: reasons to take this   ALPRAZolam 0.25 MG tablet Commonly known as: XANAX Take 1 tablet (0.25 mg total) by mouth 3 (three) times daily as needed for anxiety.   aspirin 81 MG tablet Take 81 mg by mouth daily.   carvedilol 3.125 MG tablet Commonly known as: COREG Take 1 tablet (3.125 mg total) by mouth 2 (two) times daily with a meal.   famotidine 20  MG tablet Commonly known as: PEPCID You can buy this at any drug store or the generic equivalent for use at home.  Discuss with Dr. Olevia Perches on return visist, What changed:  how much to take how to take this when to take this additional instructions   lidocaine-prilocaine cream Commonly known as: EMLA Apply 1 Application topically as needed. What changed:  when to take this reasons to take this   meclizine 25 MG tablet Commonly  known as: ANTIVERT Take 1 tablet by mouth 3 (three) times daily as needed for dizziness.   Melatonin 10 MG Tabs Take 1 tablet by mouth daily as needed (For sleep).   omeprazole 40 MG capsule Commonly known as: PRILOSEC Take 40 mg by mouth daily.   ondansetron 8 MG disintegrating tablet Commonly known as: ZOFRAN-ODT Take 1 tablet (8 mg total) by mouth every 8 (eight) hours as needed for nausea or vomiting.   oxyCODONE 5 MG immediate release tablet Commonly known as: Roxicodone Take 1-2 tablets (5-10 mg total) by mouth every 4 (four) hours as needed for severe pain. What changed: reasons to take this   prochlorperazine 10 MG tablet Commonly known as: COMPAZINE Take 1 tablet (10 mg total) by mouth every 6 (six) hours as needed for nausea or vomiting.   sucralfate 1 GM/10ML suspension Commonly known as: CARAFATE Take 1 g by mouth 4 (four) times daily.        Follow-up Information     Loel Dubonnet, NP Follow up on 05/06/2022.   Specialty: Cardiology Why: at 2:20 pm for your cardiology appointment Contact information: [place] [address] [phone]         Truitt Merle, MD Follow up in 1 week(s).   Specialties: Hematology, Oncology Contact information: [address] [place] [address] [phone]                Discharge Exam: Danley Danker Weights   04/23/22 1525 04/24/22 0001 04/25/22 0503  Weight: 44 kg 45.5 kg 47.4 kg   General-appears in no acute distress Heart-S1-S2, regular, no murmur auscultated Lungs-clear to auscultation bilaterally, no wheezing or crackles auscultated Abdomen-soft, nontender, no organomegaly Extremities-no edema in the lower extremities Neuro-alert, oriented x3, no focal deficit noted  Condition at discharge: good  The results of significant diagnostics from this hospitalization (including imaging, microbiology, ancillary and laboratory) are listed below for reference.   Imaging  Studies: ECHOCARDIOGRAM COMPLETE  Result Date: 04/24/2022    ECHOCARDIOGRAM REPORT   Patient Name:   [name] Date of Exam: 04/24/2022 Medical Rec #:  [id number]      Height:       60.0 in Accession #:    [id number]     Weight:       100.2 lb Date of Birth:  1955-05-02     BSA:          1.391 m Patient Age:    56 years       BP:           105/65 mmHg Patient Gender: F              HR:           100 bpm. Exam Location:  Inpatient Procedure: 2D Echo, Color Doppler, Cardiac Doppler and Intracardiac            Opacification Agent Indications:    NSTEMI  History:        Patient has prior history of Echocardiogram examinations,  most                 recent 06/04/2014. CAD.  Sonographer:    Raquel Sarna Senior RDCS Referring Phys: [id number] [place]  1. Global hypokinesis with inferior akinesis. Left ventricular ejection fraction, by estimation, is 25 to 30%. The left ventricle has severely decreased function. The left ventricle demonstrates global hypokinesis. Left ventricular diastolic parameters are consistent with Grade I diastolic dysfunction (impaired relaxation).  2. Right ventricular systolic function is low normal. The right ventricular size is normal. There is mildly elevated pulmonary artery systolic pressure.  3. Left atrial size was mildly dilated.  4. The mitral valve is normal in structure. Mild mitral valve regurgitation. No evidence of mitral stenosis.  5. The aortic valve is tricuspid. Aortic valve regurgitation is not visualized. No aortic stenosis is present.  6. The inferior vena cava is dilated in size with <50% respiratory variability, suggesting right atrial pressure of 15 mmHg. FINDINGS  Left Ventricle: Global hypokinesis with inferior akinesis. Left ventricular ejection fraction, by estimation, is 25 to 30%. The left ventricle has severely decreased function. The left ventricle demonstrates global hypokinesis. Definity contrast agent was given IV to delineate the left ventricular  endocardial borders. The left ventricular internal cavity size was normal in size. There is no left ventricular hypertrophy. Left ventricular diastolic parameters are consistent with Grade I diastolic dysfunction (impaired relaxation). Indeterminate filling pressures. Right Ventricle: The right ventricular size is normal. No increase in right ventricular wall thickness. Right ventricular systolic function is low normal. There is mildly elevated pulmonary artery systolic pressure. The tricuspid regurgitant velocity is 2.40 m/s, and with an assumed right atrial pressure of 15 mmHg, the estimated right ventricular systolic pressure is [id number] mmHg. Left Atrium: Left atrial size was mildly dilated. Right Atrium: Right atrial size was normal in size. Pericardium: There is no evidence of pericardial effusion. Mitral Valve: The mitral valve is normal in structure. Mild mitral valve regurgitation. No evidence of mitral valve stenosis. Tricuspid Valve: The tricuspid valve is normal in structure. Tricuspid valve regurgitation is mild . No evidence of tricuspid stenosis. Aortic Valve: The aortic valve is tricuspid. Aortic valve regurgitation is not visualized. No aortic stenosis is present. Pulmonic Valve: The pulmonic valve was normal in structure. Pulmonic valve regurgitation is not visualized. No evidence of pulmonic stenosis. Aorta: The aortic root is normal in size and structure. Venous: The inferior vena cava is dilated in size with less than 50% respiratory variability, suggesting right atrial pressure of 15 mmHg. IAS/Shunts: No atrial level shunt detected by color flow Doppler.  LEFT VENTRICLE PLAX 2D LVIDd:         4.20 cm      Diastology LVIDs:         3.80 cm      LV e' medial:    6.42 cm/s LV PW:         0.80 cm      LV E/e' medial:  14.0 LV IVS:        0.70 cm      LV e' lateral:   9.14 cm/s LVOT diam:     1.80 cm      LV E/e' lateral: 9.8 LV SV:         35 LV SV Index:   25 LVOT Area:     2.54 cm  LV Volumes (MOD)  LV vol d, MOD A2C: 100.0 ml LV vol d, MOD A4C: 81.9 ml LV vol s, MOD A2C:  67.6 ml LV vol s, MOD A4C: 59.5 ml LV SV MOD A2C:     32.4 ml LV SV MOD A4C:     81.9 ml LV SV MOD BP:      28.7 ml RIGHT VENTRICLE RV S prime:     7.94 cm/s TAPSE (M-mode): 1.3 cm LEFT ATRIUM             Index        RIGHT ATRIUM           Index LA diam:        3.60 cm 2.59 cm/m   RA Area:     12.40 cm LA Vol (A2C):   43.0 ml 30.90 ml/m  RA Volume:   30.80 ml  22.13 ml/m LA Vol (A4C):   35.2 ml 25.30 ml/m LA Biplane Vol: 39.2 ml 28.17 ml/m  AORTIC VALVE LVOT Vmax:   86.20 cm/s LVOT Vmean:  56.500 cm/s LVOT VTI:    0.137 m  AORTA Ao Root diam: 2.40 cm Ao Asc diam:  2.90 cm MITRAL VALVE                  TRICUSPID VALVE MV Area (PHT): 4.24 cm       TR Peak grad:   23.0 mmHg MV Decel Time: 179 msec       TR Vmax:        240.00 cm/s MR Peak grad:    91.4 mmHg MR Mean grad:    62.0 mmHg    SHUNTS MR Vmax:         478.00 cm/s  Systemic VTI:  0.14 m MR Vmean:        384.0 cm/s   Systemic Diam: 1.80 cm MR PISA:         1.01 cm MR PISA Eff ROA: 6 mm MR PISA Radius:  0.40 cm MV E velocity: 89.60 cm/s MV A velocity: 128.00 cm/s MV E/A ratio:  0.70 Skeet Latch MD Electronically signed by Skeet Latch MD Signature Date/Time: 04/24/2022/4:39:17 PM    Final    CT CHEST ABDOMEN PELVIS W CONTRAST  Result Date: 04/23/2022 CLINICAL DATA:  Chest and abdominal pain, history of recent liver biopsy EXAM: CT CHEST, ABDOMEN, AND PELVIS WITH CONTRAST TECHNIQUE: Multidetector CT imaging of the chest, abdomen and pelvis was performed following the standard protocol during bolus administration of intravenous contrast. RADIATION DOSE REDUCTION: This exam was performed according to the departmental dose-optimization program which includes automated exposure control, adjustment of the mA and/or kV according to patient size and/or use of iterative reconstruction technique. CONTRAST:  128m OMNIPAQUE IOHEXOL 300 MG/ML  SOLN COMPARISON:  CT from 04/08/2022  and 03/30/2022 FINDINGS: CT CHEST FINDINGS Cardiovascular: Atherosclerotic calcifications of the thoracic aorta are again identified. Coronary calcifications are seen. Heart is mildly enlarged in size. No large central pulmonary embolus is identified although timing was not performed for embolus evaluation. Heavy calcification at the origin of the left subclavian and innominate artery are seen stable from the prior exam. Right-sided chest wall port is noted in satisfactory position. Mediastinum/Nodes: Thoracic inlet is within normal limits. No hilar or mediastinal adenopathy is noted. The esophagus is within normal limits. There is again noted thickening of the gastroesophageal junction and extending into the stomach consistent with gastric neoplasm and stable from the prior exam. Lungs/Pleura: Lungs are well aerated bilaterally. There again noted areas of ground-glass airspace opacity stable from the prior study. Some more marked consolidation is noted in the left base  consistent with worsening atelectasis. No confluent infiltrate is seen. Similar findings are noted in the right lower lobe as well. No sizable effusion is seen. Some interstitial thickening is noted which may represent some underlying edema. Musculoskeletal: No acute rib abnormality is noted. Multiple sclerotic foci are noted throughout the thoracic spine consistent with metastatic disease. No definitive compression deformity is seen. Old healed sternal fracture is noted. CT ABDOMEN PELVIS FINDINGS Hepatobiliary: There again noted multiple hypodense and peripherally enhancing lesions throughout the liver similar to that seen on prior CT of the abdomen and pelvis. Given some slight variation of the technique the overall appearance is stable. Gallbladder has been surgically removed. Pancreas: Unremarkable. No pancreatic ductal dilatation or surrounding inflammatory changes. Spleen: Normal in size without focal abnormality. Adrenals/Urinary Tract:  Adrenal glands appear within normal limits. Kidneys demonstrate a normal enhancement pattern bilaterally. No renal calculi or obstructive changes are seen. Delayed images demonstrate normal enhancement. The bladder is well distended. Stomach/Bowel: Right lower quadrant ileostomy is noted. No small bowel obstructive changes are seen. The stomach again demonstrates enhancing wall thickening at the gastroesophageal junction and extending into the gastric cardia consistent with the known history of gastric neoplasm. Vascular/Lymphatic: Aortic atherosclerosis. Persistent lymphadenopathy adjacent to the GE junction and gastrohepatic ligament is seen with central necrosis. No other significant adenopathy is noted. Reproductive: Uterus has been surgically removed. No adnexal mass is seen. Other: Persistent presacral fluid collection is noted stable from the most recent exam. Free fluid is noted in the abdomen new from the prior study Musculoskeletal: Scattered sclerotic foci are noted throughout the visualized bony structures consistent with metastatic disease. These are most prominent within the lumbar spine as well as in the proximal femurs bilaterally. IMPRESSION: CT of the chest: Changes consistent with osseous metastatic disease. Stable ground-glass airspace opacities are noted unchanged from the prior exam again likely infectious or inflammatory in nature. Increasing lower lobe atelectatic changes bilaterally as well as some changes of mild interstitial edema. CT of the abdomen and pelvis: Changes consistent with the known history of gastric neoplasm involving the gastroesophageal junction and gastric cardia. Multiple hepatic and osseous metastatic lesions are again noted and stable. Stable lymphadenopathy adjacent to the gastroesophageal junction is seen. New free fluid is noted within the abdomen likely related to the known neoplastic disease. Stable appearing right lower quadrant ileostomy. Stable presacral fluid  collection of uncertain significance. This may be postoperative in nature and chronic. Electronically Signed   By: Inez Catalina M.D.   On: 04/23/2022 18:24   IR IMAGING GUIDED PORT INSERTION  Result Date: 04/13/2022 INDICATION: Concern for metastatic gastric cancer, now with multiple liver lesions. Please perform ultrasound-guided liver lesion biopsy for tissue diagnostic purposes. Poor venous access. In need of durable intravenous access for chemotherapy administration. EXAM: 1. IMPLANTED PORT A CATH PLACEMENT WITH ULTRASOUND AND FLUOROSCOPIC GUIDANCE 2. ULTRASOUND-GUIDED LIVER LESION BIOPSY COMPARISON:  CT of the abdomen and pelvis-03/30/2022 MEDICATIONS: None ANESTHESIA/SEDATION: Moderate (conscious) sedation was employed during this procedure as administered by the Interventional Radiology RN. A total of Versed 2 mg and Fentanyl 100 mcg was administered intravenously. Moderate Sedation Time: 36 minutes. The patient's level of consciousness and vital signs were monitored continuously by radiology nursing throughout the procedure under my direct supervision. CONTRAST:  None FLUOROSCOPY TIME:  18 seconds (3 mGy) COMPLICATIONS: None immediate. PROCEDURE: The procedure, risks, benefits, and alternatives were explained to the patient. Questions regarding the procedure were encouraged and answered. The patient understands and consents to the procedure. Sonographic  evaluation was performed of the abdomen demonstrating multiple I so and hypoechoic lesions scattered throughout both the right and left lobes of the liver. A dominant approximately 3.0 x 2.8 cm hypoechoic mass within the left lobe of the liver (image 3), correlating with the dominant mass seen on preceding abdominal CT image 17, series 2, was targeted for biopsy given location and sonographic window The skin overlying the midline of the abdomen was prepped and draped in usual sterile fashion. After the overlying soft tissues were anesthetized with 1%  lidocaine with epinephrine, a 17 gauge coaxial needle was advanced into the peripheral aspect of the hypoechoic mass. Next, six 18 gauge core needle biopsies were obtained under direct ultrasound guidance, placed in formalin and submitted for pathologic analysis. Multiple ultrasound images were saved procedural documentation purposes The coaxial needle was removed following the administration of a Gel-Foam slurry and superficial hemostasis was achieved with manual compression. Post biopsy imaging was negative for complication, specifically, no evidence of a significant perihepatic hematoma. _________________________________________________________ Attention was now paid towards port a catheter placement. The right neck and chest were prepped with chlorhexidine in a sterile fashion, and a sterile drape was applied covering the operative field. Maximum barrier sterile technique with sterile gowns and gloves were used for the procedure. A timeout was performed prior to the initiation of the procedure. Local anesthesia was provided with 1% lidocaine with epinephrine. After creating a small venotomy incision, a micropuncture kit was utilized to access the internal jugular vein. Real-time ultrasound guidance was utilized for vascular access including the acquisition of a permanent ultrasound image documenting patency of the accessed vessel. The microwire was utilized to measure appropriate catheter length. A subcutaneous port pocket was then created along the upper chest wall utilizing a combination of sharp and blunt dissection. The pocket was irrigated with sterile saline. A single lumen Slim sized power injectable port was chosen for placement. The 8 Fr catheter was tunneled from the port pocket site to the venotomy incision. The port was placed in the pocket. The external catheter was trimmed to appropriate length. At the venotomy, an 8 Fr peel-away sheath was placed over a guidewire under fluoroscopic guidance. The  catheter was then placed through the sheath and the sheath was removed. Final catheter positioning was confirmed and documented with a fluoroscopic spot radiograph. The port was accessed with a Huber needle, aspirated and flushed with heparinized saline. The port pocket incision was closed with interrupted 2-0 Vicryl suture. Dermabond and Steri-strips were applied to both incisions. Dressings were applied. The patient tolerated the procedure well without immediate post procedural complication. FINDINGS: Sonographic evaluation demonstrates multiple isoechoic and hypoechoic lesions scattered within both the right and left lobes of the liver. A dominant 3 cm hypoechoic mass within the left lobe of the liver was targeted for biopsy given location and sonographic window. Imaging demonstrates appropriate positioning of the 18 gauge core needle biopsy device within the hypoechoic mass. After catheter placement, the tip lies within the superior cavoatrial junction. The catheter aspirates and flushes normally and is ready for immediate use. IMPRESSION: 1. Successful ultrasound-guided biopsy of dominant hypoechoic mass within the left lobe of the liver. 2. Successful placement of a right internal jugular approach power injectable Port-A-Cath. The catheter is ready for immediate use. Electronically Signed   By: Sandi Mariscal M.D.   On: 04/13/2022 11:54   IR US LIVER BIOPSY  Result Date: 04/13/2022 INDICATION: Concern for metastatic gastric cancer, now with multiple liver lesions. Please perform ultrasound-guided  liver lesion biopsy for tissue diagnostic purposes. Poor venous access. In need of durable intravenous access for chemotherapy administration. EXAM: 1. IMPLANTED PORT A CATH PLACEMENT WITH ULTRASOUND AND FLUOROSCOPIC GUIDANCE 2. ULTRASOUND-GUIDED LIVER LESION BIOPSY COMPARISON:  CT of the abdomen and pelvis-03/30/2022 MEDICATIONS: None ANESTHESIA/SEDATION: Moderate (conscious) sedation was employed during this  procedure as administered by the Interventional Radiology RN. A total of Versed 2 mg and Fentanyl 100 mcg was administered intravenously. Moderate Sedation Time: 36 minutes. The patient's level of consciousness and vital signs were monitored continuously by radiology nursing throughout the procedure under my direct supervision. CONTRAST:  None FLUOROSCOPY TIME:  18 seconds (3 mGy) COMPLICATIONS: None immediate. PROCEDURE: The procedure, risks, benefits, and alternatives were explained to the patient. Questions regarding the procedure were encouraged and answered. The patient understands and consents to the procedure. Sonographic evaluation was performed of the abdomen demonstrating multiple I so and hypoechoic lesions scattered throughout both the right and left lobes of the liver. A dominant approximately 3.0 x 2.8 cm hypoechoic mass within the left lobe of the liver (image 3), correlating with the dominant mass seen on preceding abdominal CT image 17, series 2, was targeted for biopsy given location and sonographic window The skin overlying the midline of the abdomen was prepped and draped in usual sterile fashion. After the overlying soft tissues were anesthetized with 1% lidocaine with epinephrine, a 17 gauge coaxial needle was advanced into the peripheral aspect of the hypoechoic mass. Next, six 18 gauge core needle biopsies were obtained under direct ultrasound guidance, placed in formalin and submitted for pathologic analysis. Multiple ultrasound images were saved procedural documentation purposes The coaxial needle was removed following the administration of a Gel-Foam slurry and superficial hemostasis was achieved with manual compression. Post biopsy imaging was negative for complication, specifically, no evidence of a significant perihepatic hematoma. _________________________________________________________ Attention was now paid towards port a catheter placement. The right neck and chest were prepped  with chlorhexidine in a sterile fashion, and a sterile drape was applied covering the operative field. Maximum barrier sterile technique with sterile gowns and gloves were used for the procedure. A timeout was performed prior to the initiation of the procedure. Local anesthesia was provided with 1% lidocaine with epinephrine. After creating a small venotomy incision, a micropuncture kit was utilized to access the internal jugular vein. Real-time ultrasound guidance was utilized for vascular access including the acquisition of a permanent ultrasound image documenting patency of the accessed vessel. The microwire was utilized to measure appropriate catheter length. A subcutaneous port pocket was then created along the upper chest wall utilizing a combination of sharp and blunt dissection. The pocket was irrigated with sterile saline. A single lumen Slim sized power injectable port was chosen for placement. The 8 Fr catheter was tunneled from the port pocket site to the venotomy incision. The port was placed in the pocket. The external catheter was trimmed to appropriate length. At the venotomy, an 8 Fr peel-away sheath was placed over a guidewire under fluoroscopic guidance. The catheter was then placed through the sheath and the sheath was removed. Final catheter positioning was confirmed and documented with a fluoroscopic spot radiograph. The port was accessed with a Huber needle, aspirated and flushed with heparinized saline. The port pocket incision was closed with interrupted 2-0 Vicryl suture. Dermabond and Steri-strips were applied to both incisions. Dressings were applied. The patient tolerated the procedure well without immediate post procedural complication. FINDINGS: Sonographic evaluation demonstrates multiple isoechoic and hypoechoic lesions scattered within  both the right and left lobes of the liver. A dominant 3 cm hypoechoic mass within the left lobe of the liver was targeted for biopsy given location  and sonographic window. Imaging demonstrates appropriate positioning of the 18 gauge core needle biopsy device within the hypoechoic mass. After catheter placement, the tip lies within the superior cavoatrial junction. The catheter aspirates and flushes normally and is ready for immediate use. IMPRESSION: 1. Successful ultrasound-guided biopsy of dominant hypoechoic mass within the left lobe of the liver. 2. Successful placement of a right internal jugular approach power injectable Port-A-Cath. The catheter is ready for immediate use. Electronically Signed   By: Sandi Mariscal M.D.   On: 04/13/2022 11:54   CT Chest W Contrast  Result Date: 04/08/2022 CLINICAL DATA:  staging for metastatic disease, secondary malignant neoplasm of liver EXAM: CT CHEST WITH CONTRAST TECHNIQUE: Multidetector CT imaging of the chest was performed during intravenous contrast administration. RADIATION DOSE REDUCTION: This exam was performed according to the departmental dose-optimization program which includes automated exposure control, adjustment of the mA and/or kV according to patient size and/or use of iterative reconstruction technique. CONTRAST:  81m OMNIPAQUE IOHEXOL 300 MG/ML  SOLN COMPARISON:  03/30/2022 FINDINGS: Cardiovascular: The heart is not enlarged. Left ventricular dilatation. No pericardial effusion. Ectasia of the ascending thoracic aorta without frank aneurysm. 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 Mediastinum/Nodes: There is a mass centered at the gastric cardia and gastroesophageal junction, consistent with likely primary gastric malignancy. The remainder of the thoracic esophagus, trachea, and thyroid are unremarkable. Likely enlarged lymph node along the right lateral margin of the distal thoracic esophagus reference image 111/2, measuring 11 mm in short axis. No other pathologic mediastinal,  hilar, or axillary adenopathy. Lungs/Pleura: No pulmonary nodules or masses. Scattered areas of subpleural ground-glass airspace disease are seen within the bilateral apices and left lower lobe, which may be inflammatory or infectious. No effusion or pneumothorax. Mild left lower lobe bronchial wall thickening and bronchiectasis, which could be sequela of chronic aspiration or bronchitis. Central airways are patent. Upper Abdomen: The innumerable hypodense masses throughout the liver parenchyma are again identified consistent with metastatic disease. As described above, there is irregular masslike mural thickening of the distal thoracic esophagus, gastroesophageal junction, and gastric cardia consistent with primary gastric malignancy. Endoscopy is recommended if not previously performed. Lymphadenopathy within the gastrohepatic ligament consistent with nodal metastases, measuring up to 11 mm in short axis. This is unchanged. Musculoskeletal: There are no acute displaced fractures. Multiple sclerotic lesions are seen throughout the thoracolumbar spine, consistent with sclerotic bony metastases. Reconstructed images demonstrate no additional findings. IMPRESSION: 1. Irregular masslike mural thickening involving the gastric cardia, consistent with primary gastric malignancy. This extends to involve the gastroesophageal junction and distal thoracic esophagus. Endoscopy recommended if not previously performed. 2. Stable innumerable liver hypodense masses consistent with hepatic metastatic disease. 3. Pathologic adenopathy within the right paraesophageal region and upper abdomen in the gastrohepatic ligament, consistent with nodal metastatic disease. 4. Diffuse sclerotic lesions throughout the thoracolumbar spine consistent with bony metastatic disease. 5. Mild left lower lobe bronchiectasis and bronchial wall thickening consistent with chronic aspiration or bronchitis. 6. Scattered subpleural ground-glass opacities  within the lung apices and left lower lobe, likely inflammatory or infectious. 7.  Aortic Atherosclerosis (ICD10-I70.0). Electronically Signed   By: MRanda NgoM.D.   On: 04/08/2022 21:54   CT ABDOMEN PELVIS W CONTRAST  Result Date: 03/30/2022 CLINICAL DATA:  Epigastric pain, Crohn's disease EXAM: CT ABDOMEN AND PELVIS WITH CONTRAST TECHNIQUE: Multidetector CT imaging of the abdomen and pelvis was performed using the standard protocol following bolus administration of intravenous contrast. RADIATION DOSE REDUCTION: This exam was performed according to the departmental dose-optimization program which includes automated exposure control, adjustment of the mA and/or kV according to patient size and/or use of iterative reconstruction technique. CONTRAST:  80 mL OMNIPAQUE IOHEXOL 300 MG/ML  SOLN COMPARISON:  06/17/2013 FINDINGS: Lower chest: No acute abnormality. Hepatobiliary: Innumerable hepatic hypodense lesions are new compared to the prior study and most likely a neoplastic process. The largest lesion is in the right lobe centrally measuring 4.5 cm. No biliary ductal dilatation identified. Patient is status post cholecystectomy. Pancreas: Unremarkable. No pancreatic ductal dilatation or surrounding inflammatory changes. Spleen: Normal in size without focal abnormality. Adrenals/Urinary Tract: Adrenal glands are unremarkable. Kidneys are normal, without renal calculi, focal lesion, or hydronephrosis. Bladder is unremarkable. Stomach/Bowel: There is thickening of the gastric mucosa in the cardia region. Endoscopic evaluation recommended for possible mass. These findings could also be attributed to patient's history of Crohn's disease. There is a right lower quadrant ostomy. No dilated bowel is seen to suggest obstruction. Presacral fluid collection identified measuring 4.2 cm. Possible bowel wall thickening noted in the pelvis. Evaluation for bowel pathology was limited by lack of contrast. There is an  anastomosis in the right hemipelvis. Vascular/Lymphatic: Aortic atherosclerosis. No enlarged abdominal or pelvic lymph nodes. Reproductive: Status post hysterectomy. No adnexal masses. Other: Small amount fluid in the cul-de-sac. Right lower quadrant ostomy. Musculoskeletal: Innumerable osteoblastic lesions identified in the lumbar spine and pelvis which are new compared to the prior study consistent with metastatic disease. There is also a small osteoblastic lesion in the left femoral neck. IMPRESSION: 1. New hepatic lesions concerning for neoplasm. 2. New osteoblastic lesions concerning for neoplasm. 3. Abnormal appearance of the gastric cardia and GE junction with possible mass versus inflammation related to patient's history of Crohn's disease. This could represent the primary neoplasm. Endoscopic correlation is recommended. 4. Fluid collection in the presacral region which may be related to abnormally thickened bowel loops. An abscess is not excluded. It should be noted that evaluation for bowel pathology was limited by the lack GI contrast. Electronically Signed   By: Sammie Bench M.D.   On: 03/30/2022 20:43    Microbiology: Results for orders placed or performed during the hospital encounter of 04/23/22  Blood culture (routine x 2)     Status: None (Preliminary result)   Collection Time: 04/23/22  3:50 PM   Specimen: BLOOD LEFT FOREARM  Result Value Ref Range Status   Specimen Description   Final    BLOOD LEFT FOREARM Performed at [facility], [place] [address] [place], [place] [address]    Special Requests   Final    BOTTLES DRAWN AEROBIC AND ANAEROBIC Blood Culture results may not be optimal due to an inadequate volume of blood received in culture bottles Performed at [facility] [address] [place], [place] [address]    Culture   Final    NO GROWTH 2 DAYS Performed at [facility] [address], [place], [place] [address]    Report Status  PENDING  Incomplete  Blood culture (routine x 2)     Status: None (Preliminary result)   Collection Time: 04/23/22  4:00 PM   Specimen: BLOOD LEFT HAND  Result Value Ref Range Status  Specimen Description   Final    BLOOD LEFT HAND Performed at [facility], [place] [address] [place], [place] [address]    Special Requests   Final    BOTTLES DRAWN AEROBIC AND ANAEROBIC Blood Culture adequate volume Performed at [facility] [address], [place], [place] [address]    Culture   Final    NO GROWTH 2 DAYS Performed at [facility] [address], [place], [place] [address]    Report Status PENDING  Incomplete    Labs: CBC: Recent Labs  Lab 04/21/22 1004 04/23/22 1550 04/24/22 0054 04/25/22 0530  WBC 13.6* 12.3* 9.6 8.4  NEUTROABS 10.5* 10.9*  --   --   HGB 11.0* 10.3* 9.4* 9.2*  HCT 32.6* 32.0* 29.6* 27.6*  MCV 83.0 86.0 85.3 85.4  PLT 239 184 175 [id number]   Basic Metabolic Panel: Recent Labs  Lab 04/21/22 1004 04/23/22 1550 04/24/22 0054 04/24/22 1621 04/25/22 0530  NA 126* 127*  --  127* 129*  K 4.1 5.2*  --  5.5* 5.0  CL 94* 95*  --  100 102  CO2 22 17*  --  16* 15*  GLUCOSE 116* 106*  --  102* 123*  BUN 23 39*  --  40* 44*  CREATININE 1.27* 1.41* 1.40* 1.38* 1.49*  CALCIUM 8.6* 7.7*  --  7.6* 7.8*   Liver Function Tests: Recent Labs  Lab 04/21/22 1004 04/23/22 1550  AST 83* 143*  ALT 29 50*  ALKPHOS 763* 655*  BILITOT 1.0 1.1  PROT 6.8 6.5  ALBUMIN 3.4* 3.2*   CBG: No results for input(s): "GLUCAP" in the last 168 hours.  Discharge time spent: greater than 30 minutes.  Signed: Oswald Hillock, MD [facility] 04/25/2022

## 2022-04-25 NOTE — Progress Notes (Signed)
Pt going home today. IV team in and deaccessed [facility] for home.  Pt states her daughter is coming to pick her up, has her shirt and coat. Pt states daughter will need to hear the discharge instructions. Will let pt's primary RN know this, AVS printout on top of pt's room computer to be reviewed with pt's daughter arrives.

## 2022-04-25 NOTE — Progress Notes (Signed)
Mobility Specialist Progress Note:   04/25/22 1514  Mobility  Activity Ambulated with assistance in room  Level of Assistance Standby assist, set-up cues, supervision of patient - no hands on  Assistive Device Front wheel walker  Distance Ambulated (ft) 80 ft  Activity Response Tolerated well  Mobility Referral Yes  $Mobility charge 1 Mobility   Pt received in bed and agreeable. C/o fatigue throughout ambulation, requiring 1x standing rest break. Pt returned to supine with all needs met and call bell in reach.   Andrey Campanile Mobility Specialist Please contact via SecureChat or  Rehab office at [phone]

## 2022-04-25 NOTE — Progress Notes (Signed)
Progress Note  Patient Name: [name] Date of Encounter: 04/25/2022  Primary Cardiologist:   None   Subjective   Abdominal pain.  Says that her breathing is OK.  No chest pain.   Inpatient Medications    Scheduled Meds:  aspirin EC  81 mg Oral Daily   Chlorhexidine Gluconate Cloth  6 each Topical Daily   pantoprazole  40 mg Oral Daily   sodium chloride flush  3 mL Intravenous Q12H   sodium zirconium cyclosilicate  5 g Oral BID   Continuous Infusions:  sodium chloride     sodium chloride 100 mL/hr at 04/24/22 1146   heparin 900 Units/hr (04/24/22 2253)   PRN Meds: sodium chloride, acetaminophen, iohexol, naLOXone (NARCAN)  injection, ondansetron (ZOFRAN) IV, oxyCODONE, sodium chloride flush   Vital Signs    Vitals:   04/24/22 1646 04/24/22 1929 04/25/22 0108 04/25/22 0503  BP: 94/61 95/65 108/72 102/74  Pulse: 96 (!) 102 (!) 104 (!) 103  Resp: 20 20  18  $ Temp: (!) 97.4 F (36.3 C) 97.8 F (36.6 C)  97.8 F (36.6 C)  TempSrc: Oral Oral  Oral  SpO2: 97% 95%  92%  Weight:    47.4 kg  Height:        Intake/Output Summary (Last 24 hours) at 04/25/2022 0834 Last data filed at 04/25/2022 0200 Gross per 24 hour  Intake 349.88 ml  Output --  Net 349.88 ml   Filed Weights   04/23/22 1525 04/24/22 0001 04/25/22 0503  Weight: 44 kg 45.5 kg 47.4 kg    Telemetry    NSR - Personally Reviewed  ECG    NA - Personally Reviewed  Physical Exam   GEN: No acute distress.   Neck: No  JVD Cardiac: RRR, no murmurs, rubs, or gallops.  Respiratory: Clear  to auscultation bilaterally. GI: Soft, nontender, non-distended  MS: No  edema; No deformity. Neuro:  Nonfocal  Psych: Normal affect   Labs    Chemistry Recent Labs  Lab 04/21/22 1004 04/21/22 1004 04/23/22 1550 04/24/22 0054 04/24/22 1621 04/25/22 0530  NA 126*  --  127*  --  127* 129*  K 4.1  --  5.2*  --  5.5* 5.0  CL 94*  --  95*  --  100 102  CO2 22  --  17*  --  16* 15*  GLUCOSE 116*  --   106*  --  102* 123*  BUN 23  --  39*  --  40* 44*  CREATININE 1.27*   < > 1.41* 1.40* 1.38* 1.49*  CALCIUM 8.6*  --  7.7*  --  7.6* 7.8*  PROT 6.8  --  6.5  --   --   --   ALBUMIN 3.4*  --  3.2*  --   --   --   AST 83*  --  143*  --   --   --   ALT 29  --  50*  --   --   --   ALKPHOS 763*  --  655*  --   --   --   BILITOT 1.0  --  1.1  --   --   --   GFRNONAA 46*   < > 41* 41* 42* 38*  ANIONGAP 10  --  15  --  11 12   < > = values in this interval not displayed.     Hematology Recent Labs  Lab 04/23/22 1550 04/24/22 0054 04/25/22 0530  WBC 12.3* 9.6 8.4  RBC 3.72* 3.47* 3.23*  HGB 10.3* 9.4* 9.2*  HCT 32.0* 29.6* 27.6*  MCV 86.0 85.3 85.4  MCH 27.7 27.1 28.5  MCHC 32.2 31.8 33.3  RDW 14.2 14.3 14.4  PLT 184 175 189    Cardiac EnzymesNo results for input(s): "TROPONINI" in the last 168 hours. No results for input(s): "TROPIPOC" in the last 168 hours.   BNPNo results for input(s): "BNP", "PROBNP" in the last 168 hours.   DDimer No results for input(s): "DDIMER" in the last 168 hours.   Radiology    ECHOCARDIOGRAM COMPLETE  Result Date: 04/24/2022    ECHOCARDIOGRAM REPORT   Patient Name:   CAMIYA DIGENOVA Date of Exam: 04/24/2022 Medical Rec #:  [id number]      Height:       60.0 in Accession #:    [id number]     Weight:       100.2 lb Date of Birth:  03-05-1956     BSA:          1.391 m Patient Age:    67 years       BP:           105/65 mmHg Patient Gender: F              HR:           100 bpm. Exam Location:  Inpatient Procedure: 2D Echo, Color Doppler, Cardiac Doppler and Intracardiac            Opacification Agent Indications:    NSTEMI  History:        Patient has prior history of Echocardiogram examinations, most                 recent 06/04/2014. CAD.  Sonographer:    Raquel Sarna Senior RDCS Referring Phys: [id number] Wilder  1. Global hypokinesis with inferior akinesis. Left ventricular ejection fraction, by estimation, is 25 to 30%. The left ventricle has  severely decreased function. The left ventricle demonstrates global hypokinesis. Left ventricular diastolic parameters are consistent with Grade I diastolic dysfunction (impaired relaxation).  2. Right ventricular systolic function is low normal. The right ventricular size is normal. There is mildly elevated pulmonary artery systolic pressure.  3. Left atrial size was mildly dilated.  4. The mitral valve is normal in structure. Mild mitral valve regurgitation. No evidence of mitral stenosis.  5. The aortic valve is tricuspid. Aortic valve regurgitation is not visualized. No aortic stenosis is present.  6. The inferior vena cava is dilated in size with <50% respiratory variability, suggesting right atrial pressure of 15 mmHg. FINDINGS  Left Ventricle: Global hypokinesis with inferior akinesis. Left ventricular ejection fraction, by estimation, is 25 to 30%. The left ventricle has severely decreased function. The left ventricle demonstrates global hypokinesis. Definity contrast agent was given IV to delineate the left ventricular endocardial borders. The left ventricular internal cavity size was normal in size. There is no left ventricular hypertrophy. Left ventricular diastolic parameters are consistent with Grade I diastolic dysfunction (impaired relaxation). Indeterminate filling pressures. Right Ventricle: The right ventricular size is normal. No increase in right ventricular wall thickness. Right ventricular systolic function is low normal. There is mildly elevated pulmonary artery systolic pressure. The tricuspid regurgitant velocity is 2.40 m/s, and with an assumed right atrial pressure of 15 mmHg, the estimated right ventricular systolic pressure is [id number] mmHg. Left Atrium: Left atrial size was mildly dilated. Right Atrium: Right atrial size was normal in  size. Pericardium: There is no evidence of pericardial effusion. Mitral Valve: The mitral valve is normal in structure. Mild mitral valve regurgitation. No  evidence of mitral valve stenosis. Tricuspid Valve: The tricuspid valve is normal in structure. Tricuspid valve regurgitation is mild . No evidence of tricuspid stenosis. Aortic Valve: The aortic valve is tricuspid. Aortic valve regurgitation is not visualized. No aortic stenosis is present. Pulmonic Valve: The pulmonic valve was normal in structure. Pulmonic valve regurgitation is not visualized. No evidence of pulmonic stenosis. Aorta: The aortic root is normal in size and structure. Venous: The inferior vena cava is dilated in size with less than 50% respiratory variability, suggesting right atrial pressure of 15 mmHg. IAS/Shunts: No atrial level shunt detected by color flow Doppler.  LEFT VENTRICLE PLAX 2D LVIDd:         4.20 cm      Diastology LVIDs:         3.80 cm      LV e' medial:    6.42 cm/s LV PW:         0.80 cm      LV E/e' medial:  14.0 LV IVS:        0.70 cm      LV e' lateral:   9.14 cm/s LVOT diam:     1.80 cm      LV E/e' lateral: 9.8 LV SV:         35 LV SV Index:   25 LVOT Area:     2.54 cm  LV Volumes (MOD) LV vol d, MOD A2C: 100.0 ml LV vol d, MOD A4C: 81.9 ml LV vol s, MOD A2C: 67.6 ml LV vol s, MOD A4C: 59.5 ml LV SV MOD A2C:     32.4 ml LV SV MOD A4C:     81.9 ml LV SV MOD BP:      28.7 ml RIGHT VENTRICLE RV S prime:     7.94 cm/s TAPSE (M-mode): 1.3 cm LEFT ATRIUM             Index        RIGHT ATRIUM           Index LA diam:        3.60 cm 2.59 cm/m   RA Area:     12.40 cm LA Vol (A2C):   43.0 ml 30.90 ml/m  RA Volume:   30.80 ml  22.13 ml/m LA Vol (A4C):   35.2 ml 25.30 ml/m LA Biplane Vol: 39.2 ml 28.17 ml/m  AORTIC VALVE LVOT Vmax:   86.20 cm/s LVOT Vmean:  56.500 cm/s LVOT VTI:    0.137 m  AORTA Ao Root diam: 2.40 cm Ao Asc diam:  2.90 cm MITRAL VALVE                  TRICUSPID VALVE MV Area (PHT): 4.24 cm       TR Peak grad:   23.0 mmHg MV Decel Time: 179 msec       TR Vmax:        240.00 cm/s MR Peak grad:    91.4 mmHg MR Mean grad:    62.0 mmHg    SHUNTS MR Vmax:          478.00 cm/s  Systemic VTI:  0.14 m MR Vmean:        384.0 cm/s   Systemic Diam: 1.80 cm MR PISA:         1.01 cm MR PISA Eff ROA:  6 mm MR PISA Radius:  0.40 cm MV E velocity: 89.60 cm/s MV A velocity: 128.00 cm/s MV E/A ratio:  0.70 Skeet Latch MD Electronically signed by Skeet Latch MD Signature Date/Time: 04/24/2022/4:39:17 PM    Final    CT CHEST ABDOMEN PELVIS W CONTRAST  Result Date: 04/23/2022 CLINICAL DATA:  Chest and abdominal pain, history of recent liver biopsy EXAM: CT CHEST, ABDOMEN, AND PELVIS WITH CONTRAST TECHNIQUE: Multidetector CT imaging of the chest, abdomen and pelvis was performed following the standard protocol during bolus administration of intravenous contrast. RADIATION DOSE REDUCTION: This exam was performed according to the departmental dose-optimization program which includes automated exposure control, adjustment of the mA and/or kV according to patient size and/or use of iterative reconstruction technique. CONTRAST:  181m OMNIPAQUE IOHEXOL 300 MG/ML  SOLN COMPARISON:  CT from 04/08/2022 and 03/30/2022 FINDINGS: CT CHEST FINDINGS Cardiovascular: Atherosclerotic calcifications of the thoracic aorta are again identified. Coronary calcifications are seen. Heart is mildly enlarged in size. No large central pulmonary embolus is identified although timing was not performed for embolus evaluation. Heavy calcification at the origin of the left subclavian and innominate artery are seen stable from the prior exam. Right-sided chest wall port is noted in satisfactory position. Mediastinum/Nodes: Thoracic inlet is within normal limits. No hilar or mediastinal adenopathy is noted. The esophagus is within normal limits. There is again noted thickening of the gastroesophageal junction and extending into the stomach consistent with gastric neoplasm and stable from the prior exam. Lungs/Pleura: Lungs are well aerated bilaterally. There again noted areas of ground-glass airspace opacity  stable from the prior study. Some more marked consolidation is noted in the left base consistent with worsening atelectasis. No confluent infiltrate is seen. Similar findings are noted in the right lower lobe as well. No sizable effusion is seen. Some interstitial thickening is noted which may represent some underlying edema. Musculoskeletal: No acute rib abnormality is noted. Multiple sclerotic foci are noted throughout the thoracic spine consistent with metastatic disease. No definitive compression deformity is seen. Old healed sternal fracture is noted. CT ABDOMEN PELVIS FINDINGS Hepatobiliary: There again noted multiple hypodense and peripherally enhancing lesions throughout the liver similar to that seen on prior CT of the abdomen and pelvis. Given some slight variation of the technique the overall appearance is stable. Gallbladder has been surgically removed. Pancreas: Unremarkable. No pancreatic ductal dilatation or surrounding inflammatory changes. Spleen: Normal in size without focal abnormality. Adrenals/Urinary Tract: Adrenal glands appear within normal limits. Kidneys demonstrate a normal enhancement pattern bilaterally. No renal calculi or obstructive changes are seen. Delayed images demonstrate normal enhancement. The bladder is well distended. Stomach/Bowel: Right lower quadrant ileostomy is noted. No small bowel obstructive changes are seen. The stomach again demonstrates enhancing wall thickening at the gastroesophageal junction and extending into the gastric cardia consistent with the known history of gastric neoplasm. Vascular/Lymphatic: Aortic atherosclerosis. Persistent lymphadenopathy adjacent to the GE junction and gastrohepatic ligament is seen with central necrosis. No other significant adenopathy is noted. Reproductive: Uterus has been surgically removed. No adnexal mass is seen. Other: Persistent presacral fluid collection is noted stable from the most recent exam. Free fluid is noted in  the abdomen new from the prior study Musculoskeletal: Scattered sclerotic foci are noted throughout the visualized bony structures consistent with metastatic disease. These are most prominent within the lumbar spine as well as in the proximal femurs bilaterally. IMPRESSION: CT of the chest: Changes consistent with osseous metastatic disease. Stable ground-glass airspace opacities are noted unchanged from  the prior exam again likely infectious or inflammatory in nature. Increasing lower lobe atelectatic changes bilaterally as well as some changes of mild interstitial edema. CT of the abdomen and pelvis: Changes consistent with the known history of gastric neoplasm involving the gastroesophageal junction and gastric cardia. Multiple hepatic and osseous metastatic lesions are again noted and stable. Stable lymphadenopathy adjacent to the gastroesophageal junction is seen. New free fluid is noted within the abdomen likely related to the known neoplastic disease. Stable appearing right lower quadrant ileostomy. Stable presacral fluid collection of uncertain significance. This may be postoperative in nature and chronic. Electronically Signed   By: Inez Catalina M.D.   On: 04/23/2022 18:24    Cardiac Studies   Echo:  1. Global hypokinesis with inferior akinesis. Left ventricular ejection  fraction, by estimation, is 25 to 30%. The left ventricle has severely  decreased function. The left ventricle demonstrates global hypokinesis.  Left ventricular diastolic parameters  are consistent with Grade I diastolic dysfunction (impaired relaxation).   2. Right ventricular systolic function is low normal. The right  ventricular size is normal. There is mildly elevated pulmonary artery  systolic pressure.   3. Left atrial size was mildly dilated.   4. The mitral valve is normal in structure. Mild mitral valve  regurgitation. No evidence of mitral stenosis.   5. The aortic valve is tricuspid. Aortic valve regurgitation  is not  visualized. No aortic stenosis is present.   6. The inferior vena cava is dilated in size with <50% respiratory  variability, suggesting right atrial pressure of 15 mmHg.   FINDINGS   Left Ventricle: Global hypokinesis with inferior akinesis. Left  ventricular ejection fraction, by estimation, is 25 to 30%. The left  ventricle has severely decreased function. The left ventricle demonstrates  global hypokinesis. Definity contrast agent  was given IV to delineate the left ventricular endocardial borders. The  left ventricular internal cavity size was normal in size. There is no left  ventricular hypertrophy. Left ventricular diastolic parameters are  consistent with Grade I diastolic  dysfunction (impaired relaxation). Indeterminate filling pressures.   Patient Profile     67 y.o. female with a hx of CAD s/p PCI 1987, Crohn's disease, metastatic gastric cancer who is being seen 04/24/2022 for the evaluation of elevated troponin.    Assessment & Plan   Elevated troponin:  Thought to be demand ischemia in the face of possible sepsis and overall comorbid conditions.  Discontinue heparin as it has been 48 hours  Cardiomypathy:  Newly noted with reduced EF.  Given the overall frailty and ongoing issues, I would forgo any invasive evaluation and continue supportive care with med titration difficult to secondary to hyperkalemia (avoid ACE/ARB/Spironolactone) and lower EF.  Will try to add a very low dose of Coreg.      Hyponatremia:   Stable low.  Restrict free water.    Hyperkalemia:   Normalized.  Metastatic gastric adenocarcinoma/colectomy/malnutrition  For questions or updates, please contact Bernie Please consult [email or web address] for contact info under Cardiology/STEMI.   Signed, Minus Breeding, MD  04/25/2022, 8:34 AM

## 2022-04-25 NOTE — Progress Notes (Signed)
ANTICOAGULATION CONSULT NOTE  Pharmacy Consult for IV heparin Indication: chest pain/ACS  Allergies  Allergen Reactions   Remicade [Infliximab]     convulsions    Patient Measurements: Height: 5' (152.4 cm) Weight: 47.4 kg (104 lb 9.6 oz) IBW/kg (Calculated) : 45.5  Vital Signs: Temp: 97.8 F (36.6 C) (02/19 0503) Temp Source: Oral (02/19 0503) BP: 102/74 (02/19 0503) Pulse Rate: 103 (02/19 0503)  Labs: Recent Labs    04/23/22 1550 04/23/22 1550 04/23/22 1740 04/24/22 0054 04/24/22 1000 04/24/22 1621 04/24/22 2142 04/25/22 0530 04/25/22 0806  HGB 10.3*  --   --  9.4*  --   --   --  9.2*  --   HCT 32.0*  --   --  29.6*  --   --   --  27.6*  --   PLT 184  --   --  175  --   --   --  189  --   HEPARINUNFRC  --    < >  --  0.23* 0.21*  --  0.24*  --  0.26*  CREATININE 1.41*  --   --  1.40*  --  1.38*  --  1.49*  --   TROPONINIHS 5,369*  --  5,220*  --   --   --   --   --   --    < > = values in this interval not displayed.    Estimated Creatinine Clearance: 26.3 mL/min (A) (by C-G formula based on SCr of 1.49 mg/dL (H)).    Assessment: 40 yoF with PMH CAD (remote history MI in her 46's); recent Dx gastric cancer and first dose of FOLFOX on 2/15; presented to [facility] for 5FU pump removal but was sent to [facility] ED with hypotension and severe abdominal pain. In ED, found to have markedly elevated troponins > 5k. No anticoag PTA. Pharmacy consulted to dose IV heparin for ACS.  Heparin level remains subtherapeutic (0.26) on infusion at  units/hr. No issues with line or bleeding reported per RN.  Goal of Therapy: Heparin level 0.3-0.7 units/ml Monitor platelets by anticoagulation protocol: Yes  Plan: Increase heparin gtt to 1000 units/hr  Plan for heparin gtt x48hrs per cardiology (this would be 2/19 1800)  Sloan Leiter, PharmD, BCPS, BCCCP Clinical Pharmacist Please refer to [facility] for [place] numbers 2/19/20248:46 AM

## 2022-04-25 NOTE — Progress Notes (Signed)
Initial Nutrition Assessment  DOCUMENTATION CODES:   Non-severe (moderate) malnutrition in context of chronic illness  INTERVENTION:  Continue regular diet as ordered to allow pt ability to order foods she feels she can tolerate best Ensure Enlive po BID, each supplement provides 350 kcal and 20 grams of protein (strawberry and ROOM TEMP) Beneprotein 1 scoop TID with meals, each serving provides 25kcal and 6g protein MVI with minerals daily  NUTRITION DIAGNOSIS:   Moderate Malnutrition related to chronic illness (metastatic gastric cancer) as evidenced by mild fat depletion, moderate muscle depletion, severe muscle depletion.  GOAL:   Patient will meet greater than or equal to 90% of their needs  MONITOR:   PO intake, Labs, Weight trends  REASON FOR ASSESSMENT:   Malnutrition Screening Tool    ASSESSMENT:  Pt admitted with c/o abdominal pain, found to have cardiogenic shock. PMH significant for Crohn's colitis s/p colectomy and ileostomy, metastatic gastric cancer, CAD s/p angioplasty.  Per Cardiology, no plans for invasive work up at this time d/t metastatic cancer.  Pt followed by OP Dietitian at the [facility]. Started FOLFOX 3 days PTA. Endorses some lethargy and fatigue.  Spoke with pt at bedside. She reports that she is following a liquid only diet. Occasionally she eats soups with very small and well diced pieces of ground beef. When she eats anything solid it gets stuck in her esophagus d/t cancer at GE junction. She typically consumes half to one whole Ensure at home daily. She is only able to tolerate room temperature food items and denies taste changes, sensitivity to metal or nausea and vomiting.   Pt states that her weight was about 109.9 lbs. She lost about 4 lbs in just a few days but states that her weight is slowly beginning to increase again and is around 107 lbs.  Per review of weight history, pt's weight on 01/24 was 45.4 kg. Since that time her weight  has fluctuated up and down between 43-45 kg. Current weight noted to be 47.4 kg.   Medications: protonix, lokelma  Labs: sodium 129, BUN 44, Cr 1.49, GFR 38, corrected calcium 8.44   NUTRITION - FOCUSED PHYSICAL EXAM:  Flowsheet Row Most Recent Value  Orbital Region Mild depletion  Upper Arm Region Mild depletion  Thoracic and Lumbar Region Mild depletion  Buccal Region No depletion  Temple Region Mild depletion  Clavicle Bone Region Moderate depletion  Clavicle and Acromion Bone Region Mild depletion  Scapular Bone Region Moderate depletion  Dorsal Hand Moderate depletion  Patellar Region Severe depletion  Anterior Thigh Region Severe depletion  Posterior Calf Region Moderate depletion  Edema (RD Assessment) None  Hair Reviewed  Eyes Reviewed  Mouth Other (Comment)  [poor dentition]  Skin Reviewed  Nails Reviewed       Diet Order:   Diet Order             Diet regular Room service appropriate? Yes; Fluid consistency: Thin  Diet effective now                   EDUCATION NEEDS:   Education needs have been addressed  Skin:  Skin Assessment: Reviewed RN Assessment  Last BM:  2/19 via ileostomy  Height:   Ht Readings from Last 1 Encounters:  04/24/22 5' (1.524 m)    Weight:   Wt Readings from Last 1 Encounters:  04/25/22 47.4 kg   BMI:  Body mass index is 20.43 kg/m.  Estimated Nutritional Needs:   Kcal:  1400-1600  Protein:  70-85g  Fluid:  1.4-1.6L  Clayborne Dana, RDN, LDN Clinical Nutrition

## 2022-04-25 NOTE — TOC Progression Note (Signed)
Transition of Care [facility]) - Progression Note    Patient Details  Name: [name] MRN: [id number] Date of Birth: [birth year]  [facility] CM/SW Contact  Zenon Mayo, RN Phone Number: 04/25/2022, 12:52 PM  Clinical Narrative:    From home, has recent dx of stomach cancer,  presents with hypotension and severe abdominal pain. Conts on hep drip. TOC following.         Expected Discharge Plan and Services                                               Social Determinants of Health (SDOH) Interventions SDOH Screenings   Food Insecurity: No Food Insecurity (04/04/2022)  Housing: Low Risk  (04/04/2022)  Transportation Needs: No Transportation Needs (04/04/2022)  Utilities: Not At Risk (04/04/2022)  Depression (PHQ2-9): Low Risk  (04/04/2022)  Financial Resource Strain: High Risk (04/15/2022)  Tobacco Use: High Risk (04/23/2022)    Readmission Risk Interventions     No data to display

## 2022-04-25 NOTE — Progress Notes (Signed)
Port de-accessed for discharge. Site with small amount clear drainage from puncture site. Otherwise unremarkable (S/P insertion 04/13/22). Pt denies any pain at site. Gauze dressing applied. Pt reports she plans to call for follow-up appt with oncology tomorrow.

## 2022-04-26 ENCOUNTER — Telehealth: Payer: Self-pay

## 2022-04-26 ENCOUNTER — Other Ambulatory Visit: Payer: Self-pay

## 2022-04-26 ENCOUNTER — Emergency Department (HOSPITAL_COMMUNITY)
Admission: EM | Admit: 2022-04-26 | Discharge: 2022-05-06 | Disposition: E | Payer: Medicare HMO | Attending: Emergency Medicine | Admitting: Emergency Medicine

## 2022-04-26 ENCOUNTER — Telehealth: Payer: Self-pay | Admitting: Hematology

## 2022-04-26 DIAGNOSIS — I469 Cardiac arrest, cause unspecified: Secondary | ICD-10-CM | POA: Insufficient documentation

## 2022-04-26 DIAGNOSIS — I2510 Atherosclerotic heart disease of native coronary artery without angina pectoris: Secondary | ICD-10-CM | POA: Diagnosis not present

## 2022-04-26 DIAGNOSIS — Z7982 Long term (current) use of aspirin: Secondary | ICD-10-CM | POA: Diagnosis not present

## 2022-04-26 DIAGNOSIS — Z85028 Personal history of other malignant neoplasm of stomach: Secondary | ICD-10-CM | POA: Diagnosis not present

## 2022-04-26 MED ORDER — SODIUM CHLORIDE 0.9 % IV SOLN
1.0000 g | Freq: Once | INTRAVENOUS | Status: AC
  Administered 2022-04-26: 1 g via INTRAVENOUS

## 2022-04-26 MED ORDER — SODIUM BICARBONATE 8.4 % IV SOLN
50.0000 meq | Freq: Once | INTRAVENOUS | Status: AC
  Administered 2022-04-26: 50 meq via INTRAVENOUS

## 2022-04-27 ENCOUNTER — Inpatient Hospital Stay: Payer: Medicare HMO | Admitting: Hematology

## 2022-04-28 ENCOUNTER — Encounter (HOSPITAL_COMMUNITY): Payer: Self-pay | Admitting: Hematology

## 2022-04-28 LAB — CULTURE, BLOOD (ROUTINE X 2)
Culture: NO GROWTH
Culture: NO GROWTH
Special Requests: ADEQUATE

## 2022-05-05 ENCOUNTER — Ambulatory Visit: Payer: Medicare HMO | Admitting: Hematology

## 2022-05-05 ENCOUNTER — Ambulatory Visit: Payer: Medicare HMO

## 2022-05-05 ENCOUNTER — Other Ambulatory Visit: Payer: Medicare HMO

## 2022-05-06 ENCOUNTER — Ambulatory Visit (HOSPITAL_BASED_OUTPATIENT_CLINIC_OR_DEPARTMENT_OTHER): Payer: Medicare HMO | Admitting: Family

## 2022-05-06 NOTE — ED Notes (Signed)
Time of death called by Dr Nechama Guard

## 2022-05-06 NOTE — ED Provider Notes (Signed)
[place] Provider Note   CSN: [id number] Arrival date & time: April 29, 2022  1307     History  Chief Complaint  Patient presents with   Cardiac Arrest    [name] is a 67 y.o. female.with pmh Crohn's colitis s/p colectomy, metastatic gastric cancer, CAD s/p angioplasty just discharged yesterday from hospital for NSTEMI, cardiomyopathy and worsening pain related to metastatic cancer brought in by EMS today in active cardiac arrest.  According to EMS, daughter called EMS at 1224 today for her mother being unresponsive.  They were on the way to the hospital for her mother feeling unwell complaining of some difficulty breathing, wheezing and discomfort.  In the car she became unresponsive, patient had CPR started by daughter and bystanders.  When EMS arrived they continued CP RR with [facility] machine, intubated patient, gave 5 doses of epinephrine.  With EMS, she remained in PEA arrest with no ROSC.  When they initially got to patient she was asystole.  They also gave patient D10 bolus.   Cardiac Arrest      Home Medications Prior to Admission medications   Medication Sig Start Date End Date Taking? Authorizing Provider  acetaminophen (TYLENOL) 325 MG tablet Take 2 tablets (650 mg total) by mouth every 6 (six) hours as needed for fever, headache, mild pain or moderate pain. Patient taking differently: Take 650 mg by mouth every 6 (six) hours as needed for fever, headache or mild pain. 06/23/14   Earnstine Regal, PA-C  ALPRAZolam Duanne Moron) 0.25 MG tablet Take 1 tablet (0.25 mg total) by mouth 3 (three) times daily as needed for anxiety. 04/15/22   Truitt Merle, MD  aspirin 81 MG tablet Take 81 mg by mouth daily.      [provider]  carvedilol (COREG) 3.125 MG tablet Take 1 tablet (3.125 mg total) by mouth 2 (two) times daily with a meal. 04/25/22   Oswald Hillock, MD  famotidine (PEPCID) 20 MG tablet You can buy this at any drug store or the  generic equivalent for use at home.  Discuss with Dr. Olevia Perches on return visist, Patient taking differently: Take 20 mg by mouth daily. 06/23/14   Earnstine Regal, PA-C  lidocaine-prilocaine (EMLA) cream Apply 1 Application topically as needed. Patient taking differently: Apply 1 Application topically daily as needed (for pain). 04/15/22   Truitt Merle, MD  meclizine (ANTIVERT) 25 MG tablet Take 1 tablet by mouth 3 (three) times daily as needed for dizziness.    [provider]  Melatonin 10 MG TABS Take 1 tablet by mouth daily as needed (For sleep).    [provider]  omeprazole (PRILOSEC) 40 MG capsule Take 40 mg by mouth daily. 03/22/22   [provider]  ondansetron (ZOFRAN-ODT) 8 MG disintegrating tablet Take 1 tablet (8 mg total) by mouth every 8 (eight) hours as needed for nausea or vomiting. 04/05/22   Dede Query T, PA-C  oxyCODONE (ROXICODONE) 5 MG immediate release tablet Take 1-2 tablets (5-10 mg total) by mouth every 4 (four) hours as needed for severe pain. Patient taking differently: Take 5-10 mg by mouth every 4 (four) hours as needed for moderate pain. 04/21/22   Truitt Merle, MD  prochlorperazine (COMPAZINE) 10 MG tablet Take 1 tablet (10 mg total) by mouth every 6 (six) hours as needed for nausea or vomiting. 04/15/22   Truitt Merle, MD  sucralfate (CARAFATE) 1 GM/10ML suspension Take 1 g by mouth 4 (four) times daily.  [provider]      Allergies    Remicade [infliximab]    Review of Systems   Review of Systems  Physical Exam Updated Vital Signs Ht 5' (1.524 m)   Wt 47.4 kg   BMI 20.41 kg/m  Physical Exam Constitutional: critically ill female, unresponsive. Eyes: pupils dilated and not reactive      Mouth/Throat: frothy bloody sputum at ETT Cardiovascular: [facility] machine with active CPR, no palpable pulses Respiratory: intubated with eTT, BS equal bilateral Gastrointestinal: distended, previous surgical scars Musculoskeletal:  atraumatic Neurologic: no spontaneous movement or breaths Skin: Skin is cool, dry mottled Psychiatric: unresponsive  ED Results / Procedures / Treatments   Labs (all labs ordered are listed, but only abnormal results are displayed) Labs Reviewed - No data to display  EKG None  Radiology No results found.  Procedures .Critical Care  Performed by: Elgie Congo, MD Authorized by: Elgie Congo, MD   Critical care provider statement:    Critical care time (minutes):  40   Critical care was necessary to treat or prevent imminent or life-threatening deterioration of the following conditions:  Cardiac failure and circulatory failure   Critical care was time spent personally by me on the following activities:  Evaluation of patient's response to treatment, examination of patient, pulse oximetry, re-evaluation of patient's condition, review of old charts, obtaining history from patient or surrogate and ordering and review of radiographic studies     Medications Ordered in ED Medications  calcium chloride 1 g in sodium chloride 0.9 % 100 mL IVPB (0 g Intravenous Stopped 05-20-22 1709)  sodium bicarbonate injection 50 mEq (50 mEq Intravenous Given 2022/05/20 1310)    ED Course/ Medical Decision Making/ A&P                             Medical Decision Making [name] is a 67 y.o. female.with pmh Crohn's colitis s/p colectomy, metastatic gastric cancer, CAD s/p angioplasty just discharged yesterday from hospital for NSTEMI, cardiomyopathy and worsening pain related to metastatic cancer brought in by EMS today in active cardiac arrest.   Asystole on arrival with EMS.  Approximately 45 minutes of bystander and EMS CPR with [facility] machine.  She was intubated by EMS.  Given 5 times epinephrine.  Initially asystole later continued PEA arrest with EMS, no ROSC.  No V-fib events.  Found to be with sugar in the 60s given D10 by EMS.  On arrival to ER, patient remains in cardiac  arrest.  She is pale, cool and mottled.  Her pupils are dilated and unresponsive.  ET tube was in place with breath sounds equal bilaterally. No c/f tension pneumo.  Due to reported PEA arrest, she was given bicarbonate and calcium for possible hyperkalemia or acidosis etiology.  However, do suspect likely cardiac versus pulmonary etiology such as acute flash pulmonary edema especially with recent cardiomyopathy.  However she was intubated.  After repeat round of CPR in ED with [facility] machine and bicarbonate and calcium given, end-tidal was still poor at 10 despite active CPR and ventilation through ETT.  She had no pulse on pulse check and was in asystole.  Bedside ultrasound showed no cardiac activity.  Time of death called at 1314.  Daughter was updated and at bedside after.  Risk Prescription drug management.    Final Clinical Impression(s) / ED Diagnoses Final diagnoses:  Cardiac arrest ([place])    Rx / DC Orders  ED Discharge Orders     None         Elgie Congo, MD May 21, 2022 [phone]

## 2022-05-06 NOTE — Progress Notes (Signed)
Responded to page to support daughter and friend.  Pt. Passed. Chaplain provided emotional and grief support. Chaplain available as needed.  Jaclynn Major, [place], [facility], Pager [phone]

## 2022-05-06 NOTE — ED Triage Notes (Signed)
Pt arrived via [facility]. Pt's daughter was on the way to bringing pt to the hosp, because she said she didn't feel well and she was wheezing. The pt's daughter pulled over to a gas station when pt became unresponsive. Per EMS, bystanders started CPR on pt and then EMS resumed CPR and did it for 20 mins. Per EMS pt was asystole and PES the entire time. EMS gave epix5 and entire bag of D10 drip. Pt arrived with BVM, and with lucas giving CPR. Pt pale and mottled.

## 2022-05-06 NOTE — ED Notes (Signed)
Time of death called by Dr. Bo Merino

## 2022-05-06 NOTE — Telephone Encounter (Signed)
Pt's daughter called stating pt was recently d/c from hospital 04/25/2022 home.  Pt's daughter stated the pt woke up this morning with facial swelling, SOB, skin cold to touch, and fatigue.  Pt's daughter wanted to know if she should take the pt to the ED or be seen by Dr. Burr Medico.  Instructed pt's daughter to take the pt to the ED for further evaluation.  Informed pt's daughter that this RN will make Dr. Burr Medico aware.  Pt's daughter verbalized understanding and had no further questions or concerns.  Notified Dr. Burr Medico.

## 2022-05-06 NOTE — Progress Notes (Deleted)
[place]   Telephone:([phone] Fax:([phone]) [phone]   Clinic Follow up Note   Patient Care Team: Clovia Cuff, MD as PCP - General (Internal Medicine) Skeet Latch, MD as PCP - Cardiology (Cardiology) Truitt Merle, MD as Consulting Physician (Oncology)  Date of Service:  2022-05-15  CHIEF COMPLAINT: f/u of Gastric Cancer    CURRENT THERAPY:  Gastroesophageal Folfox  q14d x 6 cycles    ASSESSMENT: *** [name] is a 67 y.o. female with   No problem-specific Assessment & Plan notes found for this encounter.  ***   PLAN:   SUMMARY OF ONCOLOGIC HISTORY: Oncology History Overview Note   Cancer Staging  Gastric cancer [facility]) Staging form: Stomach, AJCC 8th Edition - Clinical stage from 04/13/2022: Stage IVB (cTX, cN1, pM1) - Signed by Truitt Merle, MD on 04/21/2022 Stage prefix: Initial diagnosis     Gastric cancer ([place])  04/08/2022 Imaging    IMPRESSION: 1. Irregular masslike mural thickening involving the gastric cardia, consistent with primary gastric malignancy. This extends to involve the gastroesophageal junction and distal thoracic esophagus. Endoscopy recommended if not previously performed. 2. Stable innumerable liver hypodense masses consistent with hepatic metastatic disease. 3. Pathologic adenopathy within the right paraesophageal region and upper abdomen in the gastrohepatic ligament, consistent with nodal metastatic disease. 4. Diffuse sclerotic lesions throughout the thoracolumbar spine consistent with bony metastatic disease. 5. Mild left lower lobe bronchiectasis and bronchial wall thickening consistent with chronic aspiration or bronchitis. 6. Scattered subpleural ground-glass opacities within the lung apices and left lower lobe, likely inflammatory or infectious. 7.  Aortic Atherosclerosis (ICD10-I70.0).     04/13/2022 Cancer Staging   Staging form: Stomach, AJCC 8th Edition - Clinical stage from 04/13/2022: Stage IVB (cTX,  cN1, pM1) - Signed by Truitt Merle, MD on 04/21/2022 Stage prefix: Initial diagnosis   04/15/2022 Initial Diagnosis   Gastric cancer ([place])   04/21/2022 -  Chemotherapy   Patient is on Treatment Plan : GASTROESOPHAGEAL FOLFOX q14d x 6 cycles      Imaging        INTERVAL HISTORY: *** [name] is here for a follow up of Gastric Cancer   She was last seen by me on 04/21/2022 She presents to the clinic      All other systems were reviewed with the patient and are negative.  MEDICAL HISTORY:  Past Medical History:  Diagnosis Date   Anal fistula    Arthritis    Coronary atherosclerosis of unspecified type of vessel, native or graft    Crohn's colitis ([place])    Heart murmur    History of colostomy    Hx of myocardial infarction 1986   Personal history of colonic polyps     SURGICAL HISTORY: Past Surgical History:  Procedure Laterality Date   [place] she thinks   COLOSTOMY  2012   IR IMAGING GUIDED PORT INSERTION  04/13/2022   IR US LIVER BIOPSY  04/13/2022   IRRIGATION AND DEBRIDEMENT ABSCESS N/A 06/18/2013   Procedure: Irrigation and Debridiment of peristomal abcess;  Surgeon: Gayland Curry, MD;  Location: [facility] ORS;  Service: General;  Laterality: N/A;   LAPAROTOMY N/A 06/02/2014   Procedure: EXPLORATORY LAPAROTOMY/PARTIAL COLECTOMY COLOSTOMY;  Surgeon: Excell Seltzer, MD;  Location: [facility] ORS;  Service: General;  Laterality: N/A;   [place]    I have  reviewed the social history and family history with the patient and they are unchanged from previous note.  ALLERGIES:  is allergic to remicade [infliximab].  MEDICATIONS:  Current Outpatient Medications  Medication Sig Dispense Refill   acetaminophen (TYLENOL) 325 MG tablet Take 2 tablets (650 mg total) by mouth every 6 (six) hours as needed for fever, headache, mild pain or moderate pain. (Patient taking differently: Take 650 mg by  mouth every 6 (six) hours as needed for fever, headache or mild pain.)     ALPRAZolam (XANAX) 0.25 MG tablet Take 1 tablet (0.25 mg total) by mouth 3 (three) times daily as needed for anxiety. 30 tablet 0   aspirin 81 MG tablet Take 81 mg by mouth daily.       carvedilol (COREG) 3.125 MG tablet Take 1 tablet (3.125 mg total) by mouth 2 (two) times daily with a meal. 60 tablet 2   famotidine (PEPCID) 20 MG tablet You can buy this at any drug store or the generic equivalent for use at home.  Discuss with Dr. Olevia Perches on return visist, (Patient taking differently: Take 20 mg by mouth daily.)     lidocaine-prilocaine (EMLA) cream Apply 1 Application topically as needed. (Patient taking differently: Apply 1 Application topically daily as needed (for pain).) 30 g 2   meclizine (ANTIVERT) 25 MG tablet Take 1 tablet by mouth 3 (three) times daily as needed for dizziness.     Melatonin 10 MG TABS Take 1 tablet by mouth daily as needed (For sleep).     omeprazole (PRILOSEC) 40 MG capsule Take 40 mg by mouth daily.     ondansetron (ZOFRAN-ODT) 8 MG disintegrating tablet Take 1 tablet (8 mg total) by mouth every 8 (eight) hours as needed for nausea or vomiting. 60 tablet 0   oxyCODONE (ROXICODONE) 5 MG immediate release tablet Take 1-2 tablets (5-10 mg total) by mouth every 4 (four) hours as needed for severe pain. (Patient taking differently: Take 5-10 mg by mouth every 4 (four) hours as needed for moderate pain.) 120 tablet 0   prochlorperazine (COMPAZINE) 10 MG tablet Take 1 tablet (10 mg total) by mouth every 6 (six) hours as needed for nausea or vomiting. 30 tablet 0   sucralfate (CARAFATE) 1 GM/10ML suspension Take 1 g by mouth 4 (four) times daily.     No current facility-administered medications for this visit.    PHYSICAL EXAMINATION: ECOG PERFORMANCE STATUS: {CHL ONC ECOG PS:[phone]}  There were no vitals filed for this visit. Wt Readings from Last 3 Encounters:  05/16/2022 104 lb 8 oz (47.4 kg)   04/25/22 104 lb 9.6 oz (47.4 kg)  04/21/22 95 lb 5 oz (43.2 kg)    {Only keep what was examined. If exam not performed, can use .CEXAM } GENERAL:alert, no distress and comfortable SKIN: skin color, texture, turgor are normal, no rashes or significant lesions EYES: normal, Conjunctiva are pink and non-injected, sclera clear {OROPHARYNX:no exudate, no erythema and lips, buccal mucosa, and tongue normal}  NECK: supple, thyroid normal size, non-tender, without nodularity LYMPH:  no palpable lymphadenopathy in the cervical, axillary {or inguinal} LUNGS: clear to auscultation and percussion with normal breathing effort HEART: regular rate & rhythm and no murmurs and no lower extremity edema ABDOMEN:abdomen soft, non-tender and normal bowel sounds Musculoskeletal:no cyanosis of digits and no clubbing  NEURO: alert & oriented x 3 with fluent speech, no focal motor/sensory deficits  LABORATORY DATA:  I have reviewed the data as listed    Latest Ref  Rng & Units 04/25/2022    5:30 AM 04/24/2022   12:54 AM 04/23/2022    3:50 PM  CBC  WBC 4.0 - 10.5 K/uL 8.4  9.6  12.3   Hemoglobin 12.0 - 15.0 g/dL 9.2  9.4  10.3   Hematocrit 36.0 - 46.0 % 27.6  29.6  32.0   Platelets 150 - 400 K/uL 189  175  184         Latest Ref Rng & Units 04/25/2022    5:30 AM 04/24/2022    4:21 PM 04/24/2022   12:54 AM  CMP  Glucose 70 - 99 mg/dL 123  102    BUN 8 - 23 mg/dL 44  40    Creatinine 0.44 - 1.00 mg/dL 1.49  1.38  1.40   Sodium 135 - 145 mmol/L 129  127    Potassium 3.5 - 5.1 mmol/L 5.0  5.5    Chloride 98 - 111 mmol/L 102  100    CO2 22 - 32 mmol/L 15  16    Calcium 8.9 - 10.3 mg/dL 7.8  7.6        RADIOGRAPHIC STUDIES: I have personally reviewed the radiological images as listed and agreed with the findings in the report. ECHOCARDIOGRAM COMPLETE  Result Date: 04/24/2022    ECHOCARDIOGRAM REPORT   Patient Name:   LAKIYA MACCINI Date of Exam: 04/24/2022 Medical Rec #:  [id number]      Height:        60.0 in Accession #:    [id number]     Weight:       100.2 lb Date of Birth:  September 07, 1955     BSA:          1.391 m Patient Age:    32 years       BP:           105/65 mmHg Patient Gender: F              HR:           100 bpm. Exam Location:  Inpatient Procedure: 2D Echo, Color Doppler, Cardiac Doppler and Intracardiac            Opacification Agent Indications:    NSTEMI  History:        Patient has prior history of Echocardiogram examinations, most                 recent 06/04/2014. CAD.  Sonographer:    Raquel Sarna Senior RDCS Referring Phys: [id number] [place]  1. Global hypokinesis with inferior akinesis. Left ventricular ejection fraction, by estimation, is 25 to 30%. The left ventricle has severely decreased function. The left ventricle demonstrates global hypokinesis. Left ventricular diastolic parameters are consistent with Grade I diastolic dysfunction (impaired relaxation).  2. Right ventricular systolic function is low normal. The right ventricular size is normal. There is mildly elevated pulmonary artery systolic pressure.  3. Left atrial size was mildly dilated.  4. The mitral valve is normal in structure. Mild mitral valve regurgitation. No evidence of mitral stenosis.  5. The aortic valve is tricuspid. Aortic valve regurgitation is not visualized. No aortic stenosis is present.  6. The inferior vena cava is dilated in size with <50% respiratory variability, suggesting right atrial pressure of 15 mmHg. FINDINGS  Left Ventricle: Global hypokinesis with inferior akinesis. Left ventricular ejection fraction, by estimation, is 25 to 30%. The left ventricle has severely decreased function. The left ventricle demonstrates global hypokinesis. Definity contrast agent  was given IV to delineate the left ventricular endocardial borders. The left ventricular internal cavity size was normal in size. There is no left ventricular hypertrophy. Left ventricular diastolic parameters are consistent with Grade I  diastolic dysfunction (impaired relaxation). Indeterminate filling pressures. Right Ventricle: The right ventricular size is normal. No increase in right ventricular wall thickness. Right ventricular systolic function is low normal. There is mildly elevated pulmonary artery systolic pressure. The tricuspid regurgitant velocity is 2.40 m/s, and with an assumed right atrial pressure of 15 mmHg, the estimated right ventricular systolic pressure is [id number] mmHg. Left Atrium: Left atrial size was mildly dilated. Right Atrium: Right atrial size was normal in size. Pericardium: There is no evidence of pericardial effusion. Mitral Valve: The mitral valve is normal in structure. Mild mitral valve regurgitation. No evidence of mitral valve stenosis. Tricuspid Valve: The tricuspid valve is normal in structure. Tricuspid valve regurgitation is mild . No evidence of tricuspid stenosis. Aortic Valve: The aortic valve is tricuspid. Aortic valve regurgitation is not visualized. No aortic stenosis is present. Pulmonic Valve: The pulmonic valve was normal in structure. Pulmonic valve regurgitation is not visualized. No evidence of pulmonic stenosis. Aorta: The aortic root is normal in size and structure. Venous: The inferior vena cava is dilated in size with less than 50% respiratory variability, suggesting right atrial pressure of 15 mmHg. IAS/Shunts: No atrial level shunt detected by color flow Doppler.  LEFT VENTRICLE PLAX 2D LVIDd:         4.20 cm      Diastology LVIDs:         3.80 cm      LV e' medial:    6.42 cm/s LV PW:         0.80 cm      LV E/e' medial:  14.0 LV IVS:        0.70 cm      LV e' lateral:   9.14 cm/s LVOT diam:     1.80 cm      LV E/e' lateral: 9.8 LV SV:         35 LV SV Index:   25 LVOT Area:     2.54 cm  LV Volumes (MOD) LV vol d, MOD A2C: 100.0 ml LV vol d, MOD A4C: 81.9 ml LV vol s, MOD A2C: 67.6 ml LV vol s, MOD A4C: 59.5 ml LV SV MOD A2C:     32.4 ml LV SV MOD A4C:     81.9 ml LV SV MOD BP:      28.7 ml  RIGHT VENTRICLE RV S prime:     7.94 cm/s TAPSE (M-mode): 1.3 cm LEFT ATRIUM             Index        RIGHT ATRIUM           Index LA diam:        3.60 cm 2.59 cm/m   RA Area:     12.40 cm LA Vol (A2C):   43.0 ml 30.90 ml/m  RA Volume:   30.80 ml  22.13 ml/m LA Vol (A4C):   35.2 ml 25.30 ml/m LA Biplane Vol: 39.2 ml 28.17 ml/m  AORTIC VALVE LVOT Vmax:   86.20 cm/s LVOT Vmean:  56.500 cm/s LVOT VTI:    0.137 m  AORTA Ao Root diam: 2.40 cm Ao Asc diam:  2.90 cm MITRAL VALVE  TRICUSPID VALVE MV Area (PHT): 4.24 cm       TR Peak grad:   23.0 mmHg MV Decel Time: 179 msec       TR Vmax:        240.00 cm/s MR Peak grad:    91.4 mmHg MR Mean grad:    62.0 mmHg    SHUNTS MR Vmax:         478.00 cm/s  Systemic VTI:  0.14 m MR Vmean:        384.0 cm/s   Systemic Diam: 1.80 cm MR PISA:         1.01 cm MR PISA Eff ROA: 6 mm MR PISA Radius:  0.40 cm MV E velocity: 89.60 cm/s MV A velocity: 128.00 cm/s MV E/A ratio:  0.70 Skeet Latch MD Electronically signed by Skeet Latch MD Signature Date/Time: 04/24/2022/4:39:17 PM    Final       No orders of the defined types were placed in this encounter.  All questions were answered. The patient knows to call the clinic with any problems, questions or concerns. No barriers to learning was detected. The total time spent in the appointment was {CHL ONC TIME VISIT - [id number].     Baldemar Friday, CMA 2022-05-15   I, Audry Riles, CMA, am acting as scribe for Truitt Merle, MD.   {Add scribe attestation statement}

## 2022-05-06 NOTE — Telephone Encounter (Signed)
Contacted patient to scheduled appointments. Patient is aware of appointments that are scheduled.   

## 2022-05-06 DEATH — deceased

## 2022-05-12 MED FILL — Sodium Bicarbonate IV Soln 8.4%: INTRAVENOUS | Qty: 50 | Status: AC

## 2022-05-12 MED FILL — Epinephrine Soln Prefilled Syringe 0.1 MG/10ML (10 MCG/ML): INTRAVENOUS | Qty: 10 | Status: AC

## 2022-05-16 NOTE — Addendum Note (Signed)
Encounter addended by: Betsey Holiday on: 05/16/2022 3:13 PM  Actions taken: Imaging Exam ended

## 2022-05-19 ENCOUNTER — Other Ambulatory Visit: Payer: Medicare HMO

## 2022-05-19 ENCOUNTER — Ambulatory Visit: Payer: Medicare HMO | Admitting: Hematology

## 2022-05-19 ENCOUNTER — Ambulatory Visit: Payer: Medicare HMO
